# Patient Record
Sex: Male | Born: 1935 | Race: White | Hispanic: No | Marital: Married | Smoking: Former smoker
Health system: Southern US, Community
[De-identification: ages and names within clinical notes are randomized; demographics above are authoritative.]

## PROBLEM LIST (undated history)

## (undated) DIAGNOSIS — K5790 Diverticulosis of intestine, part unspecified, without perforation or abscess without bleeding: Secondary | ICD-10-CM

## (undated) DIAGNOSIS — D692 Other nonthrombocytopenic purpura: Secondary | ICD-10-CM

## (undated) DIAGNOSIS — H9190 Unspecified hearing loss, unspecified ear: Secondary | ICD-10-CM

## (undated) DIAGNOSIS — R911 Solitary pulmonary nodule: Secondary | ICD-10-CM

## (undated) DIAGNOSIS — I10 Essential (primary) hypertension: Secondary | ICD-10-CM

## (undated) DIAGNOSIS — C801 Malignant (primary) neoplasm, unspecified: Secondary | ICD-10-CM

## (undated) DIAGNOSIS — I82409 Acute embolism and thrombosis of unspecified deep veins of unspecified lower extremity: Secondary | ICD-10-CM

## (undated) DIAGNOSIS — D126 Benign neoplasm of colon, unspecified: Secondary | ICD-10-CM

## (undated) DIAGNOSIS — E119 Type 2 diabetes mellitus without complications: Secondary | ICD-10-CM

## (undated) DIAGNOSIS — K219 Gastro-esophageal reflux disease without esophagitis: Secondary | ICD-10-CM

## (undated) DIAGNOSIS — D649 Anemia, unspecified: Secondary | ICD-10-CM

## (undated) DIAGNOSIS — R0600 Dyspnea, unspecified: Secondary | ICD-10-CM

## (undated) DIAGNOSIS — M1990 Unspecified osteoarthritis, unspecified site: Secondary | ICD-10-CM

## (undated) DIAGNOSIS — R011 Cardiac murmur, unspecified: Secondary | ICD-10-CM

## (undated) DIAGNOSIS — N289 Disorder of kidney and ureter, unspecified: Secondary | ICD-10-CM

## (undated) DIAGNOSIS — R0609 Other forms of dyspnea: Secondary | ICD-10-CM

## (undated) DIAGNOSIS — R0989 Other specified symptoms and signs involving the circulatory and respiratory systems: Secondary | ICD-10-CM

## (undated) DIAGNOSIS — R591 Generalized enlarged lymph nodes: Secondary | ICD-10-CM

## (undated) DIAGNOSIS — E785 Hyperlipidemia, unspecified: Secondary | ICD-10-CM

## (undated) DIAGNOSIS — D682 Hereditary deficiency of other clotting factors: Secondary | ICD-10-CM

## (undated) DIAGNOSIS — K4090 Unilateral inguinal hernia, without obstruction or gangrene, not specified as recurrent: Secondary | ICD-10-CM

## (undated) HISTORY — DX: Benign neoplasm of colon, unspecified: D12.6

## (undated) HISTORY — DX: Unilateral inguinal hernia, without obstruction or gangrene, not specified as recurrent: K40.90

## (undated) HISTORY — DX: Malignant (primary) neoplasm, unspecified: C80.1

## (undated) HISTORY — DX: Hyperlipidemia, unspecified: E78.5

## (undated) HISTORY — DX: Other forms of dyspnea: R06.09

## (undated) HISTORY — PX: PILONIDAL CYST EXCISION: SHX744

## (undated) HISTORY — DX: Acute embolism and thrombosis of unspecified deep veins of unspecified lower extremity: I82.409

## (undated) HISTORY — DX: Other specified symptoms and signs involving the circulatory and respiratory systems: R09.89

## (undated) HISTORY — DX: Solitary pulmonary nodule: R91.1

## (undated) HISTORY — DX: Cardiac murmur, unspecified: R01.1

## (undated) HISTORY — PX: HERNIA REPAIR: SHX51

## (undated) HISTORY — PX: COLON SURGERY: SHX602

## (undated) HISTORY — DX: Other nonthrombocytopenic purpura: D69.2

## (undated) HISTORY — PX: SURGERY OF LIP: SUR1315

## (undated) HISTORY — PX: MASTOIDECTOMY: SHX711

## (undated) HISTORY — PX: HAND / FINGER LESION EXCISION: SUR531

## (undated) HISTORY — DX: Type 2 diabetes mellitus without complications: E11.9

## (undated) HISTORY — DX: Diverticulosis of intestine, part unspecified, without perforation or abscess without bleeding: K57.90

## (undated) HISTORY — DX: Unspecified osteoarthritis, unspecified site: M19.90

## (undated) HISTORY — DX: Generalized enlarged lymph nodes: R59.1

## (undated) HISTORY — DX: Essential (primary) hypertension: I10

## (undated) HISTORY — DX: Disorder of kidney and ureter, unspecified: N28.9

## (undated) HISTORY — DX: Hereditary deficiency of other clotting factors: D68.2

## (undated) HISTORY — DX: Unspecified hearing loss, unspecified ear: H91.90

## (undated) MED FILL — Ferumoxytol Inj 510 MG/17ML (30 MG/ML) (Elemental Fe): INTRAVENOUS | Qty: 17 | Status: AC

---

## 2021-09-13 ENCOUNTER — Encounter (INDEPENDENT_AMBULATORY_CARE_PROVIDER_SITE_OTHER): Payer: Self-pay | Admitting: *Deleted

## 2021-10-31 ENCOUNTER — Encounter (INDEPENDENT_AMBULATORY_CARE_PROVIDER_SITE_OTHER): Payer: Self-pay | Admitting: Gastroenterology

## 2021-10-31 ENCOUNTER — Ambulatory Visit (INDEPENDENT_AMBULATORY_CARE_PROVIDER_SITE_OTHER): Payer: Medicare Other | Admitting: Gastroenterology

## 2021-10-31 VITALS — BP 145/62 | HR 54 | Temp 97.6°F | Ht 70.0 in | Wt 207.9 lb

## 2021-10-31 DIAGNOSIS — D509 Iron deficiency anemia, unspecified: Secondary | ICD-10-CM | POA: Insufficient documentation

## 2021-10-31 DIAGNOSIS — R933 Abnormal findings on diagnostic imaging of other parts of digestive tract: Secondary | ICD-10-CM

## 2021-10-31 DIAGNOSIS — R634 Abnormal weight loss: Secondary | ICD-10-CM | POA: Diagnosis not present

## 2021-10-31 NOTE — Patient Instructions (Signed)
Please take two iron tablets daily as you are symptomatic since decreasing to one per day ?As discussed, I did speak with Dr. Jenetta Downer regarding his recommendations, we are not able to view your recent CT images and in light of your iron deficiency, it is recommended that you proceed with EGD and colonoscopy for further evaluation. ?I understand your reservations and if you prefer to go home and discuss this further and let me know how you would like to proceed, that is fine. ?There is also a potential to do a lovenox bridge for a few days prior to your procedures while you stop your eliquis,  the doctor that prescribes your eliquis could initiate this for you if you decide to proceed. ? ?

## 2021-10-31 NOTE — Progress Notes (Signed)
? ?Referring Provider: Pomposini, Cherly Anderson, MD ?Primary Care Physician:  Pomposini, Cherly Anderson, MD ?Primary GI Physician: new ? ?Chief Complaint  ?Patient presents with  ? Anemia  ?  Patient arrives with wife Herbert Wood for a new patient visit for IDA.   ? ?HPI:   ?Herbert Wood is a 87 y.o. 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 ? ?Patient presenting today as new patient for IDA. ?  ?Hgb in feb 8.9 with MCV 87.1, ferritin 12, Iron 38, Iron sat 10%, TIBC 383, B12 1965 ?Started on iron BID, repeat labs in March: hgb 10.1, iron 133, ferritin 48. Fecal occult blood testing negative at that time.    ? ?Patient had CT Chest/Abd/Pelv wo contrast on 10/03/21 with findings of scattered reticular nodular densities of both lungs, likely post infectious or inflammatory, irregular colonic wall thickening involving the splenic flexure, suspicious for malignancy, colonoscopy follow up recommended, prominent bilateral inguinal lymph nodes, likely reactive.  ? ?States that he was diagnosed with IDA in February by PCP, started on iron BID, had repeat labs in march and was told to decrease down to one iron tablet per day. Was feeling more fatigued, dizziness upon standing and SOB, states that he felt better after he started on Iron BID, though after cutting back to once a day he began feeling more fatigue with some sob and dizziness again, not as bad as initially prior to starting iron. Does note black stools since starting iron, no BRBPR, no melena prior to Fe supplementation. Denies constipation or diarrhea. Does endorse weight loss, has lost maybe 20 lbs in the last 6 months and decrease in appetite. Notes a decrease in activity recently due to not feeling well. Denies abdominal pain, no early satiety, bloating, nausea or vomiting.  ? ?Denies issues with GERD, on pepcid '20mg'$  daily which seems to control  his symptoms well. Denies dysphagia or odynophagia.  ? ?NSAID use: none ?Social hx: no etoh or tobacco, previous smoker, quit in 1995. ?Fam [place]:GURK not think any hx of CRC in family ? ?Last Colonoscopy:08/20/17- Dr. Donnajean Lopes, had polyps per patient ?Last Endoscopy:never  ? ?Past Medical History:  ?Diagnosis Date  ? Adenomatous colon polyp   ? Carotid bruit   ? Chronic dyspnea   ? Diabetes ([place])   ? type 2  ? Diverticular disease   ? DVT (deep venous thrombosis) ([place])   ? lower limb  ? Factor V deficiency ([place])   ? 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 Hyperlipemia   ? Hypertension   ? Inguinal hernia   ? Kidney disease   ? stage 3  ? Lung nodule   ? Lymphadenopathy   ? 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 Murmur   ? Osteoarthritis   ? Senile purpura ([place])   ? ? ?Current Outpatient Medications  ?Medication Sig Dispense Refill  ? albuterol (VENTOLIN HFA) 108 (90 Base) MCG/ACT inhaler Inhale into the lungs. As needed    ? amitriptyline (ELAVIL) 10 MG tablet Take 10 mg by mouth at bedtime.    ? amLODipine (NORVASC) 5 MG tablet Take 5 mg by mouth 2 (two) times daily.    ? apixaban (ELIQUIS) 5 MG TABS tablet Take 5 mg by mouth 2 (two) times daily.    ? atorvastatin (LIPITOR) 40 MG tablet Take 40 mg by mouth daily.    ?  carvedilol (COREG) 6.25 MG tablet Take 6.25 mg by mouth 2 (two) times daily with a meal.    ? dorzolamide (TRUSOPT) 2 % ophthalmic solution 1 drop 3 (three) times daily. One drop bid    ? Famotidine (PEPCID AC PO) Take by mouth. One daily    ? furosemide (LASIX) 20 MG tablet Take 20 mg by mouth. One daily as needed    ? hydrALAZINE (APRESOLINE) 25 MG tablet Take 25 mg by mouth. 2 tid    ? irbesartan (AVAPRO) 300 MG tablet Take 300 mg by mouth daily.    ? latanoprost (XALATAN) 0.005 % ophthalmic solution 1 drop at bedtime.    ? linagliptin (TRADJENTA) 5 MG TABS tablet Take 5 mg by mouth daily.    ? loratadine (CLARITIN) 10 MG tablet Take 10 mg by mouth daily.    ? LORazepam (ATIVAN) 0.5 MG tablet Take 0.5  mg by mouth. One bid prn    ? OVER THE COUNTER MEDICATION Nature's bounty iron 65 mg one bid    ? OVER THE COUNTER MEDICATION     ? OVER THE COUNTER MEDICATION Vit C one daily ?Folic acid with U76 one daily 800 mcg ?Methyl B-12 1,000 mcg one daily ?Prostate support softgels one daily    ? ?No current facility-administered medications for this visit.  ? ? ?Allergies as of 10/31/2021  ? (No Known Allergies)  ? ? ?Social History  ? ?Socioeconomic History  ? Marital status: Married  ?  Spouse name: Not on file  ? Number of children: Not on file  ? Years of education: Not on file  ? Highest education level: Not on file  ?Occupational History  ? Not on file  ?Tobacco Use  ? Smoking status: Former  ?  Types: Cigarettes  ?  Passive exposure: Past  ? Smokeless tobacco: Never  ?Substance and Sexual Activity  ? Alcohol use: Never  ? Drug use: Never  ? Sexual activity: Not on file  ?Other Topics Concern  ? Not on file  ?Social History Narrative  ? Not on file  ? ?Social Determinants of Health  ? ?Financial Resource Strain: Not on file  ?Food Insecurity: Not on file  ?Transportation Needs: Not on file  ?Physical Activity: Not on file  ?Stress: Not on file  ?Social Connections: Not on file  ?\ ?Review of systems ?General: negative for night sweats, fever, chills, +weight loss +fatigue  ?Neck: Negative for lumps, goiter, pain and significant neck swelling ?Resp: Negative for cough, wheezing, +sob ?CV: Negative for chest pain, leg swelling, palpitations, orthopnea +dizziness ?GI: denies melena, hematochezia, nausea, vomiting, diarrhea, constipation, dysphagia, odyonophagia, early satiety. +weight loss ?MSK: Negative for joint pain or swelling, back pain, and muscle pain. ?Derm: Negative for itching or rash ?Psych: Denies depression, anxiety, memory loss, confusion. No homicidal or suicidal ideation.  ?Heme: Negative for prolonged bleeding, bruising easily, and swollen nodes. ?Endocrine: Negative for cold or heat intolerance,  polyuria, polydipsia and goiter. ?Neuro: negative for tremor, gait imbalance, syncope and seizures. ?The remainder of the review of systems is noncontributory. ? ?Physical Exam: ?BP (!) 145/62 (BP Location: Left Arm, Patient Position: Sitting, Cuff Size: Large)   Pulse (!) 54   Temp 97.6 ?F (36.4 ?C) (Oral)   Ht '5\' 10"'$  (1.778 m)   Wt 207 lb 14.4 oz (94.3 kg)   BMI 29.83 kg/m?  ?General:   Alert and oriented. No distress noted. Pleasant and cooperative.  ?Head:  Normocephalic and atraumatic. ?Eyes:  Conjuctiva clear  without scleral icterus. ?Mouth:  Oral mucosa pink and moist. Good dentition. No lesions. ?Heart: Normal rate and rhythm, s1 and s2 heart sounds present.  ?Lungs: Clear lung sounds in all lobes. Respirations equal and unlabored. ?Abdomen:  +BS, soft, non-tender and non-distended. No rebound or guarding. No HSM or masses noted. ?Derm: No palmar erythema or jaundice ?Msk:  Symmetrical without gross deformities. Normal posture. ?Extremities:  Without edema. ?Neurologic:  Alert and  oriented x4 ?Psych:  Alert and cooperative. Normal mood and affect. ? ?Invalid input(s): 6 MONTHS  ? ?ASSESSMENT: ?Herbert Wood is a 87 y.o. male presenting today as a new patient for IDA. ? ?IDA discovered in February 2022 with sob, fatigue and dizziness, started on BID oral iron with some improvement of iron studies, though more symptomatic after decreasing iron to once daily. CT A/P done 10/03/21 with irregular colonic wall thickening involving the splenic flexure, suspicious for malignancy. Pt endorses approx 20 pounds weight loss over the past 6 months without rectal bleeding or melena. Does feel that appetite has decreased, no abdominal pain, early satiety, bloating, diarrhea, constipation or any other changes in bowel habits. I discussed recommendations for further evaluation of IDA and CT findings with both EGD and Colonoscopy as we cannot rule out PUD, polyps, AVMs, or malignancy. Patient is maintained on eliquis due to  hx of factor V, patient and wife are concerned about patient undergoing endoscopic procedures given his age and co-morbidities. I do feel at this time that benefits of endoscopic evaluation outweigh the risks, in

## 2021-11-01 ENCOUNTER — Other Ambulatory Visit: Payer: Self-pay

## 2021-11-01 ENCOUNTER — Emergency Department (HOSPITAL_COMMUNITY): Payer: Medicare Other

## 2021-11-01 ENCOUNTER — Encounter (HOSPITAL_COMMUNITY): Payer: Self-pay

## 2021-11-01 ENCOUNTER — Inpatient Hospital Stay (HOSPITAL_COMMUNITY)
Admission: EM | Admit: 2021-11-01 | Discharge: 2021-11-04 | DRG: 392 | Disposition: A | Discharge status: Home / Self Care | Payer: Medicare Other | Attending: Internal Medicine | Admitting: Internal Medicine

## 2021-11-01 DIAGNOSIS — I700 Atherosclerosis of aorta: Secondary | ICD-10-CM | POA: Diagnosis present

## 2021-11-01 DIAGNOSIS — E1142 Type 2 diabetes mellitus with diabetic polyneuropathy: Secondary | ICD-10-CM | POA: Diagnosis present

## 2021-11-01 DIAGNOSIS — N200 Calculus of kidney: Secondary | ICD-10-CM | POA: Diagnosis present

## 2021-11-01 DIAGNOSIS — Z791 Long term (current) use of non-steroidal anti-inflammatories (NSAID): Secondary | ICD-10-CM

## 2021-11-01 DIAGNOSIS — N400 Enlarged prostate without lower urinary tract symptoms: Secondary | ICD-10-CM | POA: Diagnosis present

## 2021-11-01 DIAGNOSIS — Z7901 Long term (current) use of anticoagulants: Secondary | ICD-10-CM | POA: Diagnosis not present

## 2021-11-01 DIAGNOSIS — Z86010 Personal history of colonic polyps: Secondary | ICD-10-CM | POA: Diagnosis not present

## 2021-11-01 DIAGNOSIS — D6851 Activated protein C resistance: Secondary | ICD-10-CM

## 2021-11-01 DIAGNOSIS — N1831 Chronic kidney disease, stage 3a: Secondary | ICD-10-CM | POA: Diagnosis present

## 2021-11-01 DIAGNOSIS — D509 Iron deficiency anemia, unspecified: Secondary | ICD-10-CM | POA: Diagnosis present

## 2021-11-01 DIAGNOSIS — I129 Hypertensive chronic kidney disease with stage 1 through stage 4 chronic kidney disease, or unspecified chronic kidney disease: Secondary | ICD-10-CM | POA: Diagnosis present

## 2021-11-01 DIAGNOSIS — H9190 Unspecified hearing loss, unspecified ear: Secondary | ICD-10-CM | POA: Diagnosis present

## 2021-11-01 DIAGNOSIS — Z888 Allergy status to other drugs, medicaments and biological substances status: Secondary | ICD-10-CM | POA: Diagnosis not present

## 2021-11-01 DIAGNOSIS — E1122 Type 2 diabetes mellitus with diabetic chronic kidney disease: Secondary | ICD-10-CM | POA: Diagnosis present

## 2021-11-01 DIAGNOSIS — Z79899 Other long term (current) drug therapy: Secondary | ICD-10-CM | POA: Diagnosis not present

## 2021-11-01 DIAGNOSIS — Z87891 Personal history of nicotine dependence: Secondary | ICD-10-CM

## 2021-11-01 DIAGNOSIS — G629 Polyneuropathy, unspecified: Secondary | ICD-10-CM

## 2021-11-01 DIAGNOSIS — D72829 Elevated white blood cell count, unspecified: Secondary | ICD-10-CM

## 2021-11-01 DIAGNOSIS — Z86718 Personal history of other venous thrombosis and embolism: Secondary | ICD-10-CM | POA: Diagnosis not present

## 2021-11-01 DIAGNOSIS — K5720 Diverticulitis of large intestine with perforation and abscess without bleeding: Principal | ICD-10-CM | POA: Diagnosis present

## 2021-11-01 DIAGNOSIS — N281 Cyst of kidney, acquired: Secondary | ICD-10-CM | POA: Diagnosis present

## 2021-11-01 DIAGNOSIS — K5792 Diverticulitis of intestine, part unspecified, without perforation or abscess without bleeding: Secondary | ICD-10-CM | POA: Insufficient documentation

## 2021-11-01 DIAGNOSIS — N183 Chronic kidney disease, stage 3 unspecified: Secondary | ICD-10-CM

## 2021-11-01 DIAGNOSIS — N189 Chronic kidney disease, unspecified: Secondary | ICD-10-CM

## 2021-11-01 DIAGNOSIS — R634 Abnormal weight loss: Secondary | ICD-10-CM | POA: Insufficient documentation

## 2021-11-01 DIAGNOSIS — I10 Essential (primary) hypertension: Secondary | ICD-10-CM | POA: Diagnosis present

## 2021-11-01 DIAGNOSIS — E785 Hyperlipidemia, unspecified: Secondary | ICD-10-CM

## 2021-11-01 DIAGNOSIS — E119 Type 2 diabetes mellitus without complications: Secondary | ICD-10-CM

## 2021-11-01 LAB — COMPREHENSIVE METABOLIC PANEL
ALT: 28 U/L (ref 0–44)
AST: 28 U/L (ref 15–41)
Albumin: 3.6 g/dL (ref 3.5–5.0)
Alkaline Phosphatase: 104 U/L (ref 38–126)
Anion gap: 10 (ref 5–15)
BUN: 34 mg/dL — ABNORMAL HIGH (ref 8–23)
CO2: 21 mmol/L — ABNORMAL LOW (ref 22–32)
Calcium: 8.7 mg/dL — ABNORMAL LOW (ref 8.9–10.3)
Chloride: 107 mmol/L (ref 98–111)
Creatinine, Ser: 1.27 mg/dL — ABNORMAL HIGH (ref 0.61–1.24)
GFR, Estimated: 55 mL/min — ABNORMAL LOW (ref >60)
Glucose, Bld: 163 mg/dL — ABNORMAL HIGH (ref 70–99)
Potassium: 3.9 mmol/L (ref 3.5–5.1)
Sodium: 138 mmol/L (ref 135–145)
Total Bilirubin: 1 mg/dL (ref 0.3–1.2)
Total Protein: 7.2 g/dL (ref 6.5–8.1)

## 2021-11-01 LAB — CBC
HCT: 30.8 % — ABNORMAL LOW (ref 39.0–52.0)
Hemoglobin: 9.9 g/dL — ABNORMAL LOW (ref 13.0–17.0)
MCH: 30.2 pg (ref 26.0–34.0)
MCHC: 32.1 g/dL (ref 30.0–36.0)
MCV: 93.9 fL (ref 80.0–100.0)
Platelets: 270 10*3/uL (ref 150–400)
RBC: 3.28 MIL/uL — ABNORMAL LOW (ref 4.22–5.81)
RDW: 18.7 % — ABNORMAL HIGH (ref 11.5–15.5)
WBC: 24.6 10*3/uL — ABNORMAL HIGH (ref 4.0–10.5)
nRBC: 0 % (ref 0.0–0.2)

## 2021-11-01 LAB — URINALYSIS, ROUTINE W REFLEX MICROSCOPIC
Bacteria, UA: NONE SEEN
Bilirubin Urine: NEGATIVE
Glucose, UA: NEGATIVE mg/dL
Hgb urine dipstick: NEGATIVE
Ketones, ur: NEGATIVE mg/dL
Nitrite: NEGATIVE
Protein, ur: 100 mg/dL — AB
Specific Gravity, Urine: 1.034 — ABNORMAL HIGH (ref 1.005–1.030)
pH: 5 (ref 5.0–8.0)

## 2021-11-01 LAB — LACTIC ACID, PLASMA: Lactic Acid, Venous: 1.4 mmol/L (ref 0.5–1.9)

## 2021-11-01 LAB — BRAIN NATRIURETIC PEPTIDE: B Natriuretic Peptide: 336 pg/mL — ABNORMAL HIGH (ref 0.0–100.0)

## 2021-11-01 LAB — LIPASE, BLOOD: Lipase: 29 U/L (ref 11–51)

## 2021-11-01 MED ORDER — PANTOPRAZOLE SODIUM 40 MG PO TBEC
40.0000 mg | DELAYED_RELEASE_TABLET | Freq: Two times a day (BID) | ORAL | Status: DC
  Administered 2021-11-01 – 2021-11-04 (×6): 40 mg via ORAL
  Filled 2021-11-01 (×6): qty 1

## 2021-11-01 MED ORDER — IPRATROPIUM BROMIDE 0.02 % IN SOLN: 0.5000 mg | Freq: Four times a day (QID) | RESPIRATORY_TRACT | Status: DC | PRN

## 2021-11-01 MED ORDER — AMITRIPTYLINE HCL 10 MG PO TABS
10.0000 mg | ORAL_TABLET | Freq: Every day | ORAL | Status: DC
Start: 2021-11-01 — End: 2021-11-04
  Administered 2021-11-01 – 2021-11-03 (×3): 10 mg via ORAL
  Filled 2021-11-01 (×4): qty 1

## 2021-11-01 MED ORDER — SODIUM CHLORIDE 0.9 % IV SOLN: INTRAVENOUS | Status: AC

## 2021-11-01 MED ORDER — HYDRALAZINE HCL 25 MG PO TABS
50.0000 mg | ORAL_TABLET | Freq: Three times a day (TID) | ORAL | Status: DC
Start: 2021-11-01 — End: 2021-11-04
  Administered 2021-11-01 – 2021-11-04 (×9): 50 mg via ORAL
  Filled 2021-11-01 (×9): qty 2

## 2021-11-01 MED ORDER — ONDANSETRON HCL 4 MG/2ML IJ SOLN
4.0000 mg | Freq: Once | INTRAMUSCULAR | Status: AC
  Administered 2021-11-01: 4 mg via INTRAVENOUS
  Filled 2021-11-01: qty 2

## 2021-11-01 MED ORDER — OXYCODONE HCL 5 MG PO TABS
5.0000 mg | ORAL_TABLET | ORAL | Status: DC | PRN
  Administered 2021-11-01: 5 mg via ORAL
  Filled 2021-11-01: qty 1

## 2021-11-01 MED ORDER — LEVALBUTEROL HCL 0.63 MG/3ML IN NEBU: 0.6300 mg | INHALATION_SOLUTION | Freq: Four times a day (QID) | RESPIRATORY_TRACT | Status: DC | PRN

## 2021-11-01 MED ORDER — DORZOLAMIDE HCL 2 % OP SOLN
1.0000 [drp] | Freq: Two times a day (BID) | OPHTHALMIC | Status: DC
  Administered 2021-11-01 – 2021-11-04 (×6): 1 [drp] via OPHTHALMIC
  Filled 2021-11-01 (×2): qty 10

## 2021-11-01 MED ORDER — CIPROFLOXACIN IN D5W 400 MG/200ML IV SOLN
400.0000 mg | Freq: Two times a day (BID) | INTRAVENOUS | Status: DC
  Administered 2021-11-01 – 2021-11-04 (×6): 400 mg via INTRAVENOUS
  Filled 2021-11-01 (×6): qty 200

## 2021-11-01 MED ORDER — APIXABAN 5 MG PO TABS
5.0000 mg | ORAL_TABLET | Freq: Two times a day (BID) | ORAL | Status: DC
  Administered 2021-11-01 – 2021-11-02 (×3): 5 mg via ORAL
  Filled 2021-11-01 (×3): qty 1

## 2021-11-01 MED ORDER — SODIUM CHLORIDE 0.9 % IV SOLN
250.0000 mL | INTRAVENOUS | Status: DC | PRN
  Administered 2021-11-03: 250 mL via INTRAVENOUS

## 2021-11-01 MED ORDER — SODIUM CHLORIDE 0.9% FLUSH
3.0000 mL | INTRAVENOUS | Status: DC | PRN
Start: 2021-11-01 — End: 2021-11-04

## 2021-11-01 MED ORDER — IOHEXOL 300 MG/ML  SOLN
100.0000 mL | Freq: Once | INTRAMUSCULAR | Status: AC | PRN
  Administered 2021-11-01: 100 mL via INTRAVENOUS

## 2021-11-01 MED ORDER — BISACODYL 5 MG PO TBEC: 5.0000 mg | DELAYED_RELEASE_TABLET | Freq: Every day | ORAL | Status: DC | PRN

## 2021-11-01 MED ORDER — ONDANSETRON HCL 4 MG PO TABS: 4.0000 mg | ORAL_TABLET | Freq: Four times a day (QID) | ORAL | Status: DC | PRN

## 2021-11-01 MED ORDER — SODIUM CHLORIDE 0.9 % IV BOLUS
500.0000 mL | Freq: Once | INTRAVENOUS | Status: AC
  Administered 2021-11-01: 500 mL via INTRAVENOUS

## 2021-11-01 MED ORDER — METRONIDAZOLE 500 MG/100ML IV SOLN
500.0000 mg | Freq: Three times a day (TID) | INTRAVENOUS | Status: DC
  Administered 2021-11-01 – 2021-11-04 (×8): 500 mg via INTRAVENOUS
  Filled 2021-11-01 (×8): qty 100

## 2021-11-01 MED ORDER — HEPARIN SODIUM (PORCINE) 5000 UNIT/ML IJ SOLN: 5000.0000 [IU] | Freq: Three times a day (TID) | INTRAMUSCULAR | Status: DC

## 2021-11-01 MED ORDER — MORPHINE SULFATE (PF) 4 MG/ML IV SOLN
4.0000 mg | Freq: Once | INTRAVENOUS | Status: AC
  Administered 2021-11-01: 4 mg via INTRAVENOUS
  Filled 2021-11-01: qty 1

## 2021-11-01 MED ORDER — SODIUM CHLORIDE 0.9 % IV SOLN
2.0000 g | Freq: Once | INTRAVENOUS | Status: AC
  Administered 2021-11-01: 2 g via INTRAVENOUS
  Filled 2021-11-01: qty 20

## 2021-11-01 MED ORDER — SODIUM CHLORIDE 0.9% FLUSH
3.0000 mL | Freq: Two times a day (BID) | INTRAVENOUS | Status: DC
  Administered 2021-11-01 – 2021-11-03 (×3): 3 mL via INTRAVENOUS

## 2021-11-01 MED ORDER — LORAZEPAM 0.5 MG PO TABS: 0.5000 mg | ORAL_TABLET | Freq: Two times a day (BID) | ORAL | Status: DC | PRN

## 2021-11-01 MED ORDER — AMLODIPINE BESYLATE 5 MG PO TABS
5.0000 mg | ORAL_TABLET | Freq: Two times a day (BID) | ORAL | Status: DC
Start: 2021-11-01 — End: 2021-11-04
  Administered 2021-11-01 – 2021-11-04 (×7): 5 mg via ORAL
  Filled 2021-11-01 (×7): qty 1

## 2021-11-01 MED ORDER — HYDROMORPHONE HCL 1 MG/ML IJ SOLN: 0.5000 mg | INTRAMUSCULAR | Status: DC | PRN

## 2021-11-01 MED ORDER — SODIUM CHLORIDE 0.9% FLUSH
3.0000 mL | Freq: Two times a day (BID) | INTRAVENOUS | Status: DC
  Administered 2021-11-02 – 2021-11-04 (×2): 3 mL via INTRAVENOUS

## 2021-11-01 MED ORDER — ACETAMINOPHEN 650 MG RE SUPP: 650.0000 mg | Freq: Four times a day (QID) | RECTAL | Status: DC | PRN

## 2021-11-01 MED ORDER — ATORVASTATIN CALCIUM 40 MG PO TABS
40.0000 mg | ORAL_TABLET | Freq: Every day | ORAL | Status: DC
  Administered 2021-11-01 – 2021-11-03 (×3): 40 mg via ORAL
  Filled 2021-11-01 (×3): qty 1

## 2021-11-01 MED ORDER — CARVEDILOL 3.125 MG PO TABS
6.2500 mg | ORAL_TABLET | Freq: Two times a day (BID) | ORAL | Status: DC
  Administered 2021-11-01 – 2021-11-04 (×6): 6.25 mg via ORAL
  Filled 2021-11-01 (×6): qty 2

## 2021-11-01 MED ORDER — HYDRALAZINE HCL 20 MG/ML IJ SOLN
10.0000 mg | INTRAMUSCULAR | Status: DC | PRN
  Administered 2021-11-03: 10 mg via INTRAVENOUS
  Filled 2021-11-01: qty 1

## 2021-11-01 MED ORDER — LINAGLIPTIN 5 MG PO TABS
5.0000 mg | ORAL_TABLET | Freq: Every day | ORAL | Status: DC
  Administered 2021-11-01 – 2021-11-04 (×4): 5 mg via ORAL
  Filled 2021-11-01 (×4): qty 1

## 2021-11-01 MED ORDER — SENNOSIDES-DOCUSATE SODIUM 8.6-50 MG PO TABS: 1.0000 | ORAL_TABLET | Freq: Every evening | ORAL | Status: DC | PRN

## 2021-11-01 MED ORDER — METRONIDAZOLE 500 MG/100ML IV SOLN
500.0000 mg | Freq: Once | INTRAVENOUS | Status: AC
  Administered 2021-11-01: 500 mg via INTRAVENOUS
  Filled 2021-11-01: qty 100

## 2021-11-01 MED ORDER — IRBESARTAN 150 MG PO TABS
300.0000 mg | ORAL_TABLET | Freq: Every day | ORAL | Status: DC
  Administered 2021-11-01 – 2021-11-03 (×3): 300 mg via ORAL
  Filled 2021-11-01 (×3): qty 2

## 2021-11-01 MED ORDER — LATANOPROST 0.005 % OP SOLN
1.0000 [drp] | Freq: Every day | OPHTHALMIC | Status: DC
  Administered 2021-11-01 – 2021-11-03 (×3): 1 [drp] via OPHTHALMIC
  Filled 2021-11-01: qty 2.5

## 2021-11-01 MED ORDER — ACETAMINOPHEN 325 MG PO TABS
650.0000 mg | ORAL_TABLET | Freq: Four times a day (QID) | ORAL | Status: DC | PRN
  Administered 2021-11-01: 650 mg via ORAL
  Filled 2021-11-01: qty 2

## 2021-11-01 MED ORDER — TRAZODONE HCL 50 MG PO TABS
25.0000 mg | ORAL_TABLET | Freq: Every evening | ORAL | Status: DC | PRN
Start: 2021-11-01 — End: 2021-11-04

## 2021-11-01 MED ORDER — ONDANSETRON HCL 4 MG/2ML IJ SOLN
4.0000 mg | Freq: Four times a day (QID) | INTRAMUSCULAR | Status: DC | PRN
Start: 2021-11-01 — End: 2021-11-04
  Filled 2021-11-01: qty 2

## 2021-11-01 NOTE — Progress Notes (Signed)
[facility] ? ?Reviewed CT scan. Question diverticulitis of the splenic flexure with thickening and stranding. Possible intramural abscess. Would not do any IR to this given that it is likely intramural and will cause a fistula. No operative indication at this time. Would treat with antibiotics. If does not improve may need further evaluation and repeat CT with oral contrast.  ? ?Agree that this could be something other than diverticulitis and warrants a colonoscopy. ? ?Curlene Labrum, MD ?[facility] ?[place], [place] [address] ?[phone] (office) ? ?

## 2021-11-01 NOTE — Assessment & Plan Note (Signed)
-   Stable continue as needed analgesics ?

## 2021-11-01 NOTE — Assessment & Plan Note (Signed)
-   We will holding home medication ?-Checking [facility] QA [facility] with [employer] coverage ?

## 2021-11-01 NOTE — Assessment & Plan Note (Signed)
-   Continue home dose statins ?

## 2021-11-01 NOTE — Progress Notes (Addendum)
? ? ?Subjective: ?Reports he was in his usual state of health yesterday when he saw Vikki Ports, NP in the office, but developed acute onset mid lower abdominal pain pain around 11 last night.  Had 2 episodes of nonbloody emesis as well as a couple of bowel movements sometime overnight/early this morning without BRBPR or hematemesis.  Reports his stools have been dark since he was started on iron in February.  Reports his abdominal pain is about a 4 out of 10, described more as a pressure in his mid lower abdomen. ? ?Admits to taking Aleve cold and sinus daily for the last 6 months. No other NSAIDs.  Takes Pepcid every morning with this.  Denies reflux symptoms or dysphagia.  ? ?No prior EGD. ?Last colonoscopy was in 2019 with Dr. Docia Furl with polyps per patient. ? ?Objective: ?Vital signs in last 24 hours: ?Temp:  [98.5 ?F (36.9 ?C)] 98.5 ?F (36.9 ?C) (04/04 1008) ?Pulse Rate:  [81-85] 81 (04/04 1400) ?Resp:  [18-23] 23 (04/04 1400) ?BP: (165-180)/(60-119) 169/64 (04/04 1400) ?SpO2:  [91 %-95 %] 93 % (04/04 1400) ?Weight:  [94.8 kg] 94.8 kg (04/04 1010) ?  ?General:   Alert and oriented, pleasant, NAD ?Head:  Normocephalic and atraumatic. ?Eyes:  No icterus, sclera clear. Conjuctiva pink.  ?Heart:  S1, S2 present, no murmurs noted.  ?Lungs: Clear to auscultation bilaterally, without wheezing, rales, or rhonchi.  ?Abdomen:  Bowel sounds present, soft, non-distended. Very mild tenderness only to deep palpation in the left lower quadrant.  No HSM or hernias noted. No rebound or guarding. No masses appreciated  ?Msk:  Symmetrical without gross deformities. Normal posture. ?Extremities:  Without edema. ?Neurologic:  Alert and  oriented x4;  grossly normal neurologically. ?Skin:  Warm and dry, intact without significant lesions.  ?Psych:  Normal mood and affect. ? ? ?Lab Results: ?Recent Labs  ?  11/01/21 ?1023  ?WBC 24.6*  ?HGB 9.9*  ?HCT 30.8*  ?PLT 270  ? ?BMET ?Recent Labs  ?  11/01/21 ?1023  ?NA 138  ?K 3.9  ?CL 107   ?CO2 21*  ?GLUCOSE 163*  ?BUN 34*  ?CREATININE 1.27*  ?CALCIUM 8.7*  ? ?LFT ?Recent Labs  ?  11/01/21 ?1023  ?PROT 7.2  ?ALBUMIN 3.6  ?AST 28  ?ALT 28  ?ALKPHOS 104  ?BILITOT 1.0  ? ? ?Studies/Results: ?CT ABDOMEN PELVIS W CONTRAST ? ?Result Date: 11/01/2021 ?CLINICAL DATA:  Fever abdominal pain and vomiting since last night. EXAM: CT ABDOMEN AND PELVIS WITH CONTRAST TECHNIQUE: Multidetector CT imaging of the abdomen and pelvis was performed using the standard protocol following bolus administration of intravenous contrast. RADIATION DOSE REDUCTION: This exam was performed according to the departmental dose-optimization program which includes automated exposure control, adjustment of the mA and/or kV according to patient size and/or use of iterative reconstruction technique. CONTRAST:  111m OMNIPAQUE IOHEXOL 300 MG/ML  SOLN COMPARISON:  None FINDINGS: Lower chest: The lung bases are clear of acute process. No pleural effusion or pulmonary lesions. The heart is normal in size. No pericardial effusion. Aortic and coronary artery calcifications are noted. The distal esophagus and aorta are unremarkable. Hepatobiliary: No hepatic lesions or intrahepatic biliary dilatation. The gallbladder is unremarkable. No common bile duct dilatation. Pancreas: No mass, inflammation ductal dilatation. Spleen: Normal size.  No focal lesions. Adrenals/Urinary Tract: The adrenal glands are normal. There are numerous bilateral renal cysts. No worrisome renal lesions. Small right renal calculi. No hydroureteronephrosis. The bladder is unremarkable. Stomach/Bowel: The stomach, duodenum, small bowel and  terminal ileum are unremarkable. The appendix is normal. There is diffuse and severe colonic diverticulosis. Significant inflammatory type changes involving the splenic flexure region the colon. I suspect this is acute diverticulitis with a possible intramural abscess measuring 3.5 cm. Surrounding inflammatory changes involving the  pericolonic fat. I do not see an obvious mass lesion but would treat this patient for diverticulitis and recommend correlation with colon cancer screening history and possible follow-up colonoscopy if indicated. Vascular/Lymphatic: Advanced atherosclerotic calcification involving the aorta and branch vessels but no aneurysm or dissection. The major venous structures are patent. No mesenteric or retroperitoneal mass or adenopathy. Reproductive: Enlarged prostate gland with median lobe hypertrophy impressing on the base bladder. The seminal vesicles are unremarkable. Other: Small amount of free pelvic fluid is noted. Musculoskeletal: No significant bony findings. Age related osteoporosis. IMPRESSION: 1. CT findings suggest acute diverticulitis involving the splenic flexure region the colon with a probable 3.5 cm intramural abscess. 2. No obvious colonic mass. Recommend correlation with colon cancer screening history and possible follow-up colonoscopy if indicated. 3. Advanced atherosclerotic calcification involving the aorta and branch vessels. 4. Enlarged prostate gland with median lobe hypertrophy impressing on the base bladder. 5. Numerous bilateral renal cysts. 6. Small right renal calculi. Aortic Atherosclerosis (ICD10-I70.0). Electronically Signed   By: Marijo Sanes M.D.   On: 11/01/2021 12:08   ? ?Assessment: ?87 y.o. 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  CT A/P with contrast with diffuse and severe colonic diverticulosis, significant inflammatory type changes involving splenic flexure suspected to be secondary to acute diverticulitis, possible intramural abscess measuring 3.5 cm, surrounding inflammatory  changes involving pericolonic fat.  No obvious colonic mass.  ? ?Acute sigmoid diverticulitis with possible abscess: ?Noted on CT A/P with contrast upon presentation.  Associated mild lower abdominal pressure, nausea/vomiting/fever overnight, now improved.  WBC elevated at 24.6.  In light of significantly elevated white blood cell count and possible abscess, patient is being admitted for IV antibiotics.  He will need a colonoscopy no sooner than 8 weeks from now to rule out underlying malignancy.  Notably, prior CT Chest/Abd/Pel w/o contrast on 10/03/21 in [place] (per GI OV note yesterday) with findings of irregular colonic wall thickening involving the splenic flexure, suspicious for malignancy. He has also endorsed a 20 lb weight loss over the last 6 months along with decreased appetite. Last colonoscopy in 2019 with Dr. Docia Furl with polyps per patient. ? ?IDA:  ?Found to have iron deficiency anemia in February with a hemoglobin of 8.9, ferritin 12, iron 38, iron saturation 10% (per GI OV note yesterday).  He denies ever having any overt GI bleeding, but reports stools have been dark since he was started on iron in February.  Hemoglobin came up to 10.1 in March.  Hemoglobin stable at 9.9 this admission with normocytic indices.  He is chronically on Eliquis and previously denied NSAIDs, but today, admits to taking Aleve cold and sinus daily for the last 6 months.  No prior EGD.  Last colonoscopy in 2019 with Dr. Docia Furl with polyps per patient.  GI had planned for outpatient EGD and colonoscopy with possible Lovenox bridge due to history of DVT and factor V Leiden deficiency.  As his hemoglobin is stable, we will  continue with plans for outpatient procedures unless he develops overt GI bleeding or declining hemoglobin. ? ? ?Plan: ?Clear liquid diet. ?IV ciprofloxacin 400 mg twice daily. ?IV Flagyl 500 mg 3 times daily ?Monitor H&H and for overt GI bleeding. ?He will need outpatient colonoscopy no sooner than 8  weeks from now pending clinical course.  He will also need EGD at the same time.  Could consider inpatient EGD if declining hemoglobin or overt GI bleeding.  ? ? LOS: 0 days  ? ? 11/01/2021, 4:18 PM ? ? ?[employer]

## 2021-11-01 NOTE — Assessment & Plan Note (Signed)
-   Currently stable, chronically coagulated with Eliquis ?

## 2021-11-01 NOTE — Hospital Course (Signed)
? ?  Herbert Wood is a 87 year old male with significant past medical history of factor V [place] deficiency, DVT (on Eliquis), CKD 3, HTN, HLD, peripheral neuropathy, DM 2, hearing loss, iron deficiency anemia ... Presented today with chief complaint of abdominal pain, nausea vomiting. ? ?Per patient his symptoms started yesterday now he is developing fever per wife he had had a Tmax of 103, last night around 11 PM.  He had 2 episode of nonbloody emesis.  Had a normal bowel movement which was nonbloody.  Bowel movement did not relieve his pain, he continued have periumbilical region pain without radiation worsened with movement. ?Patient and his wife had had [place] food last night his wife did not get sick. ?He has been recently seen by GI for iron deficiency anemia no reported history of diverticulosis or diverticulitis. ?He has had plan with Dr. Gypsy Balsam for endoscopy and colonoscopy in near future. ? ?ED; ?Blood pressure (!) 169/64, pulse 81, temperature 98.5 ?F (36.9 ?C), temperature source Oral, resp. rate (!) 23, height '5\' 10"'$  (1.778 m), weight 94.8 kg, SpO2 93 %. ? ? ?Labs:  ?CMP within normal notes with exception of glucose 163, BUN 34, creatinine 1.27, calcium 8.7, LFT within normal limits, lipase 29, ALT 28, AST 28, T. bili 1.0, lactic acid 1.4, WBC 24.6, ? ?CT abdomen/pelvis: Acute diverticulitis involving splenic flexure in the colon, and probable 3.5 cm intramural abscess.  ?-With nonspecific finding of aortic atherosclerosis, BPH, renal cyst, small renal calculi ? ? ?Findings were discussed with general surgery Dr. Constance Haw who is not concerned about abscess..  Continue medical management for now. ? ?GI consulted ?

## 2021-11-01 NOTE — Assessment & Plan Note (Signed)
-   BUN/creatinine at baseline ?-Avoiding nephrotoxins ?

## 2021-11-01 NOTE — H&P (Signed)
?History and Physical  ? ?Patient: Herbert Wood                            PCP: Clinton Quant, MD                    ?DOB: 10/30/1934            DOA: 11/01/2021 ?QIO:[id number]             DOS: 11/01/2021, 2:56 PM ? ?Pomposini, Cherly Anderson, MD ? ?Patient coming from:   HOME  ?I have personally reviewed patient's medical records, in electronic medical records, including:  ?[place] link, and care everywhere.  ? ? ?Chief Complaint:  ? ?Chief Complaint  ?Patient presents with  ? Abdominal Pain  ? ? ?History of present illness:  ? ? ? ? ?Herbert Wood is a 87 year old male with significant past medical history of factor V Leyden deficiency, DVT (on Eliquis), CKD 3, HTN, HLD, peripheral neuropathy, DM 2, hearing loss, iron deficiency anemia ... Presented today with chief complaint of abdominal pain, nausea vomiting. ? ?Per patient his symptoms started yesterday now he is developing fever per wife he had had a Tmax of 103, last night around 11 PM.  He had 2 episode of nonbloody emesis.  Had a normal bowel movement which was nonbloody.  Bowel movement did not relieve his pain, he continued have periumbilical region pain without radiation worsened with movement. ?Patient and his wife had had [place] food last night his wife did not get sick. ?He has been recently seen by GI for iron deficiency anemia no reported history of diverticulosis or diverticulitis. ?He has had plan with Dr. Gypsy Balsam for endoscopy and colonoscopy in near future. ? ?ED; ?Blood pressure (!) 169/64, pulse 81, temperature 98.5 ?F (36.9 ?C), temperature source Oral, resp. rate (!) 23, height '5\' 10"'$  (1.778 m), weight 94.8 kg, SpO2 93 %. ? ? ?Labs:  ?CMP within normal notes with exception of glucose 163, BUN 34, creatinine 1.27, calcium 8.7, LFT within normal limits, lipase 29, ALT 28, AST 28, T. bili 1.0, lactic acid 1.4, WBC 24.6, ? ?CT abdomen/pelvis: Acute diverticulitis involving splenic flexure in the colon, and probable 3.5 cm intramural abscess.  ?-With  nonspecific finding of aortic atherosclerosis, BPH, renal cyst, small renal calculi ? ? ?Findings were discussed with general surgery Dr. Constance Haw who is not concerned about abscess..  Continue medical management for now. ? ?GI consulted ? ? ? Patient Denies having: , Chills, Cough, SOB, Chest Pain, Abd pain, N/V/D, headache, dizziness, lightheadedness,  Dysuria, Joint pain, rash, open wounds ? ?Review of Systems: As per HPI, otherwise 10 point review of systems were negative.  ? ?---------------------------------------------------------------------------------------------------------------------- ? ?Allergies  ?Allergen Reactions  ? Gadobenate Nausea And Vomiting  ?  Immediately upon the infusion of 31m multihance contrast  Patient had exteme nausea and vomiting.   No other symptoms noted .  No injury.  MRI scan was completed after patient felt better.   ?Immediately upon the infusion of 126mmultihance contrast  Patient had exteme nausea and vomiting.   No other symptoms noted .  No injury.  MRI scan was completed after patient felt better.   ?  ? ? ?Home MEDs:  ?Prior to Admission medications   ?Medication Sig Start Date End Date Taking? Authorizing Provider  ?albuterol (VENTOLIN HFA) 108 (90 Base) MCG/ACT inhaler Inhale into the lungs. As needed   Yes [provider]  ?amitriptyline (ELAVIL) 10 MG tablet Take 10 mg by mouth at bedtime.   Yes [provider]  ?amLODipine (NORVASC) 5 MG tablet Take 5 mg by mouth 2 (two) times daily.   Yes [provider]  ?apixaban (ELIQUIS) 5 MG TABS tablet Take 5 mg by mouth 2 (two) times daily.   Yes [provider]  ?atorvastatin (LIPITOR) 40 MG tablet Take 40 mg by mouth daily.   Yes [provider]  ?carvedilol (COREG) 6.25 MG tablet Take 6.25 mg by mouth 2 (two) times daily with a meal.   Yes [provider]  ?dorzolamide (TRUSOPT) 2 % ophthalmic solution Place 1 drop into the left eye 2 (two) times daily. One drop bid    Yes [provider]  ?Famotidine (PEPCID AC PO) Take by mouth. One daily   Yes [provider]  ?furosemide (LASIX) 20 MG tablet Take 20 mg by mouth daily as needed for fluid. One daily as needed   Yes [provider]  ?hydrALAZINE (APRESOLINE) 25 MG tablet Take 50 mg by mouth 3 (three) times daily.   Yes [provider]  ?irbesartan (AVAPRO) 300 MG tablet Take 300 mg by mouth daily. 10/03/21  Yes [provider]  ?latanoprost (XALATAN) 0.005 % ophthalmic solution Place 1 drop into both eyes at bedtime.   Yes [provider]  ?linagliptin (TRADJENTA) 5 MG TABS tablet Take 5 mg by mouth daily.   Yes [provider]  ?LORazepam (ATIVAN) 0.5 MG tablet Take 0.5 mg by mouth 2 (two) times daily as needed for anxiety.   Yes [provider]  ?OVER THE COUNTER MEDICATION Nature's bounty iron 65 mg one bid   Yes [provider]  ?OVER THE COUNTER MEDICATION Vit C one daily ?Folic acid with T90 one daily 800 mcg ?Methyl B-12 1,000 mcg one daily ?Prostate support softgels one daily   Yes [provider]  ? ? ?PRN MEDs: sodium chloride, acetaminophen **OR** acetaminophen, bisacodyl, hydrALAZINE, HYDROmorphone (DILAUDID) injection, ipratropium, levalbuterol, LORazepam, ondansetron **OR** ondansetron (ZOFRAN) IV, oxyCODONE, senna-docusate, sodium chloride flush, traZODone ? ?Past Medical History:  ?Diagnosis Date  ? Adenomatous colon polyp   ? Carotid bruit   ? Chronic dyspnea   ? Diabetes ([place])   ? type 2  ? Diverticular disease   ? DVT (deep venous thrombosis) ([place])   ? lower limb  ? Factor V deficiency ([place])   ? 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 Hyperlipemia   ? Hypertension   ? Inguinal hernia   ? Kidney disease   ? stage 3  ? Lung nodule   ? Lymphadenopathy   ? 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 Murmur   ? Osteoarthritis   ? Senile purpura ([place])   ? ? ?History reviewed. No pertinent surgical history. ? ? reports that he has quit  smoking. His smoking use included cigarettes. He has been exposed to tobacco smoke. He has never used smokeless tobacco. He reports that he does not drink alcohol and does not use drugs. ? ? ?History reviewed. No pertinent family history. ? ?Physical Exam:  ? ?Vitals:  ? 11/01/21 1230 11/01/21 1300 11/01/21 1330 11/01/21 1400  ?BP: (!) 177/67 (!) 180/119 (!) 175/61 (!) 169/64  ?Pulse: 82 83 82 81  ?Resp: 18 20 (!) 22 (!) 23  ?Temp:      ?TempSrc:      ?SpO2: 91% 93% 91% 93%  ?Weight:      ?  Height:      ? ?Constitutional: NAD, calm, comfortable, hard of hearing ?Eyes: PERRL, lids and conjunctivae normal ?ENMT: Mucous membranes are moist. Posterior pharynx clear of any exudate or lesions.Normal dentition.  ?Neck: normal, supple, no masses, no thyromegaly ?Respiratory: clear to auscultation bilaterally, no wheezing, no crackles. Normal respiratory effort. No accessory muscle use.  ?Cardiovascular: Regular rate and rhythm, no murmurs / rubs / gallops. No extremity edema. 2+ pedal pulses. No carotid bruits.  ?Abdomen: Periumbilical tenderness, no masses palpated. No hepatosplenomegaly. Bowel sounds positive.  ?Musculoskeletal: no clubbing / cyanosis. No joint deformity upper and lower extremities. Good ROM, no contractures. Normal muscle tone.  ?Neurologic: CN II-XII grossly intact. Sensation intact, DTR normal. Strength 5/5 in all 4.  ?Psychiatric: Normal judgment and insight. Alert and oriented x 3. Normal mood.  ?Skin: no rashes, lesions, ulcers. No induration ?Wounds: per nursing documentation ?  ? ? ?Labs on admission:  ?  ?I have personally reviewed following labs and imaging studies ? ?CBC: ?Recent Labs  ?Lab 11/01/21 ?1023  ?WBC 24.6*  ?HGB 9.9*  ?HCT 30.8*  ?MCV 93.9  ?PLT 270  ? ?Basic Metabolic Panel: ?Recent Labs  ?Lab 11/01/21 ?1023  ?NA 138  ?K 3.9  ?CL 107  ?CO2 21*  ?GLUCOSE 163*  ?BUN 34*  ?CREATININE 1.27*  ?CALCIUM 8.7*  ? ?GFR: ?Estimated Creatinine Clearance: 49.1 mL/min (A) (by C-G formula based on  SCr of 1.27 mg/dL (H)). ?Liver Function Tests: ?Recent Labs  ?Lab 11/01/21 ?1023  ?AST 28  ?ALT 28  ?ALKPHOS 104  ?BILITOT 1.0  ?PROT 7.2  ?ALBUMIN 3.6  ? ?Recent Labs  ?Lab 11/01/21 ?1023  ?LIPASE 29

## 2021-11-01 NOTE — ED Triage Notes (Signed)
Patient with complaints of abdominal pain, fever, nausea, and vomiting that started last night.  ?

## 2021-11-01 NOTE — Plan of Care (Signed)

## 2021-11-01 NOTE — Assessment & Plan Note (Addendum)
Abdominal pain  ? ?CT abdomen/pelvis: Acute diverticulitis involving splenic flexure in the colon, and probable 3.5 cm intramural abscess.  ?-With nonspecific finding of aortic atherosclerosis, BPH, renal cyst, small renal calculi ? ? ?ED providers have discussed it with general surgery Dr. Constance Haw who is not concerned about abscess..  Recommending continue medical management for now, IV antibiotics. ? ?WBC 24.6, lactic acid 1.4, ?-Normotensive, with normal lactic acid ?-We will keep the patient n.p.o. ?-Gentle IV fluid hydration ?-Continue IV antibiotics Cipro/Flagyl ?(Received Rocephin/Flagyl in ED) ? ?-General surgery, GI consulted appreciate input ? ? ?

## 2021-11-01 NOTE — Assessment & Plan Note (Signed)
-   Currently stable, no complaints continue home medication of Eliquis ?- ?

## 2021-11-01 NOTE — Assessment & Plan Note (Signed)
-   Currently mildly hypertensive ?-Resuming home medication of Norvasc, Coreg, ?-As needed hydralazine IV ?

## 2021-11-01 NOTE — Assessment & Plan Note (Addendum)
Management as above °

## 2021-11-01 NOTE — Progress Notes (Signed)
Pr arrived to floor. Oriented to room, bed, call light. PO meds given. Temp on arrivel 100.7 orally. Tylenol po given. MD informed. Call light within reach. Will continue to monitor patient.  ?

## 2021-11-01 NOTE — Assessment & Plan Note (Signed)
-   Likely due to diverticulitis, abscess, ?-Broad-spectrum antibiotics initiated in ED Rocephin, Flagyl, will switch to IV Cipro and Flagyl ? ?

## 2021-11-01 NOTE — Assessment & Plan Note (Signed)
-   Patient is wearing hearing aids ?-Able to communicate at this time. ?

## 2021-11-01 NOTE — ED Provider Notes (Signed)
?[facility] ?Provider Note ? ? ?CSN: [id number] ?Arrival date & time: 11/01/21  7829 ? ?  ? ?History ? ?Chief Complaint  ?Patient presents with  ? Abdominal Pain  ? ? ?Herbert Wood is a 87 y.o. 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  His symptoms started last evening around 11 PM.  He reports 2 episodes of nonbloody emesis, he also had a normal bowel movement shortly after this pain began, also nonbloody which did not affect his pain.  He has localized pain to his periumbilical region which is constant without radiation and is worse with movement.  He last ate at a [place] with his wife yesterday evening, wife feels well.  He has taken Tylenol prior to arrival.  He denies history of diverticulitis.  He has had no abdominal surgeries. ? ?Wife also states he had a CT scan a month ago in [place] which showed some bowel thickening.  He actually had a consult with Dr. Laural Golden yesterday and endoscopy and colonoscopy have been scheduled. ? ?The history is provided by the patient and the spouse.  ? ?  ? ?Home Medications ?Prior to Admission medications   ?Medication Sig Start Date End Date Taking? Authorizing Provider  ?albuterol (VENTOLIN HFA) 108 (90 Base) MCG/ACT inhaler Inhale into the lungs. As needed   Yes [provider]  ?amitriptyline (ELAVIL) 10 MG tablet Take 10 mg by mouth at bedtime.   Yes [provider]  ?amLODipine (NORVASC) 5 MG tablet Take 5 mg by mouth 2 (two) times daily.   Yes [provider]  ?apixaban (ELIQUIS) 5 MG TABS tablet Take 5 mg by mouth 2 (two) times daily.   Yes [provider]  ?atorvastatin (LIPITOR) 40 MG tablet Take 40 mg by  mouth daily.   Yes [provider]  ?carvedilol (COREG) 6.25 MG tablet Take 6.25 mg by mouth 2 (two) times daily with a meal.   Yes [provider]  ?dorzolamide (TRUSOPT) 2 % ophthalmic solution Place 1 drop into the left eye 2 (two) times daily. One drop bid   Yes [provider]  ?Famotidine (PEPCID AC PO) Take by mouth. One daily   Yes [provider]  ?furosemide (LASIX) 20 MG tablet Take 20 mg by mouth daily as needed for fluid. One daily as needed   Yes [provider]  ?hydrALAZINE (APRESOLINE) 25 MG tablet Take 50 mg by mouth 3 (three) times daily.   Yes [provider]  ?irbesartan (AVAPRO) 300 MG tablet Take 300 mg by mouth daily. 10/03/21  Yes [provider]  ?latanoprost (XALATAN) 0.005 % ophthalmic solution Place 1 drop into both eyes at bedtime.   Yes [provider]  ?linagliptin (TRADJENTA) 5 MG TABS tablet Take 5 mg by mouth daily.   Yes [provider]  ?LORazepam (ATIVAN) 0.5 MG tablet Take 0.5 mg by mouth 2 (two) times daily as needed for anxiety.   Yes [provider]  ?OVER THE COUNTER MEDICATION Nature's bounty iron 65 mg one bid   Yes [provider]  ?OVER THE COUNTER MEDICATION Vit C one daily ?Folic acid with F62 one daily 800 mcg ?Methyl B-12 1,000 mcg one daily ?Prostate support softgels one daily  Yes [provider]  ?   ? ?Allergies    ?Gadobenate   ? ?Review of Systems   ?Review of Systems  ?Constitutional:  Positive for fever.  ?HENT:  Negative for congestion and sore throat.   ?Eyes: Negative.   ?Respiratory:  Negative for chest tightness and shortness of breath.   ?Cardiovascular:  Negative for chest pain.  ?Gastrointestinal:  Positive for abdominal pain, nausea and vomiting. Negative for constipation and diarrhea.  ?Genitourinary: Negative.   ?Musculoskeletal:  Negative for arthralgias, joint swelling and neck pain.  ?Skin: Negative.  Negative for rash and wound.   ?Neurological:  Negative for dizziness, weakness, light-headedness, numbness and headaches.  ?Psychiatric/Behavioral: Negative.    ?All other systems reviewed and are negative. ? ?Physical Exam ?Updated Vital Signs ?BP (!) 169/64   Pulse 81   Temp 98.5 ?F (36.9 ?C) (Oral)   Resp (!) 23   Ht '5\' 10"'$  (1.778 m)   Wt 94.8 kg   SpO2 93%   BMI 29.99 kg/m?  ?Physical Exam ?Vitals and nursing note reviewed.  ?Constitutional:   ?   Appearance: He is well-developed.  ?HENT:  ?   Head: Normocephalic and atraumatic.  ?Eyes:  ?   Conjunctiva/sclera: Conjunctivae normal.  ?Cardiovascular:  ?   Rate and Rhythm: Normal rate and regular rhythm.  ?   Heart sounds: Normal heart sounds.  ?Pulmonary:  ?   Effort: Pulmonary effort is normal.  ?   Breath sounds: Normal breath sounds. No wheezing.  ?Abdominal:  ?   General: Bowel sounds are normal.  ?   Palpations: Abdomen is soft. There is no pulsatile mass.  ?   Tenderness: There is abdominal tenderness in the periumbilical area. There is no guarding or rebound.  ?   Hernia: No hernia is present.  ?Musculoskeletal:     ?   General: Normal range of motion.  ?   Cervical back: Normal range of motion.  ?Skin: ?   General: Skin is warm and dry.  ?Neurological:  ?   Mental Status: He is alert.  ? ? ?ED Results / Procedures / Treatments   ?Labs ?(all labs ordered are listed, but only abnormal results are displayed) ?Labs Reviewed  ?COMPREHENSIVE METABOLIC PANEL - Abnormal; Notable for the following components:  ?    Result Value  ? CO2 21 (*)   ? Glucose, Bld 163 (*)   ? BUN 34 (*)   ? Creatinine, Ser 1.27 (*)   ? Calcium 8.7 (*)   ? GFR, Estimated 55 (*)   ? All other components within normal limits  ?CBC - Abnormal; Notable for the following components:  ? WBC 24.6 (*)   ? RBC 3.28 (*)   ? Hemoglobin 9.9 (*)   ? HCT 30.8 (*)   ? RDW 18.7 (*)   ? All other components within normal limits  ?LIPASE, BLOOD  ?LACTIC ACID, PLASMA  ?URINALYSIS, ROUTINE W REFLEX MICROSCOPIC  ?BRAIN  NATRIURETIC PEPTIDE  ? ? ?EKG ?None ? ?Radiology ?CT ABDOMEN PELVIS W CONTRAST ? ?Result Date: 11/01/2021 ?CLINICAL DATA:  Fever abdominal pain and vomiting since last night. EXAM: CT ABDOMEN AND PELVIS WITH CONTRAST TECHNIQUE: Multidetector CT imaging of the abdomen and pelvis was performed using the standard protocol following bolus administration of intravenous contrast. RADIATION DOSE REDUCTION: This exam was performed according to the departmental dose-optimization program which includes automated exposure control, adjustment of the mA and/or kV according to patient size and/or use of iterative reconstruction technique.  CONTRAST:  171m OMNIPAQUE IOHEXOL 300 MG/ML  SOLN COMPARISON:  None FINDINGS: Lower chest: The lung bases are clear of acute process. No pleural effusion or pulmonary lesions. The heart is normal in size. No pericardial effusion. Aortic and coronary artery calcifications are noted. The distal esophagus and aorta are unremarkable. Hepatobiliary: No hepatic lesions or intrahepatic biliary dilatation. The gallbladder is unremarkable. No common bile duct dilatation. Pancreas: No mass, inflammation ductal dilatation. Spleen: Normal size.  No focal lesions. Adrenals/Urinary Tract: The adrenal glands are normal. There are numerous bilateral renal cysts. No worrisome renal lesions. Small right renal calculi. No hydroureteronephrosis. The bladder is unremarkable. Stomach/Bowel: The stomach, duodenum, small bowel and terminal ileum are unremarkable. The appendix is normal. There is diffuse and severe colonic diverticulosis. Significant inflammatory type changes involving the splenic flexure region the colon. I suspect this is acute diverticulitis with a possible intramural abscess measuring 3.5 cm. Surrounding inflammatory changes involving the pericolonic fat. I do not see an obvious mass lesion but would treat this patient for diverticulitis and recommend correlation with colon cancer screening history  and possible follow-up colonoscopy if indicated. Vascular/Lymphatic: Advanced atherosclerotic calcification involving the aorta and branch vessels but no aneurysm or dissection. The major venous structures are patent. No mesenteric

## 2021-11-01 NOTE — Assessment & Plan Note (Addendum)
-   Monitoring H&H, ?-Currently stable ? ?-Recent iron study iron 38, iron sat 10%, TIBC 383, B12 1965, ?-Continue iron supplements ?

## 2021-11-02 ENCOUNTER — Encounter (INDEPENDENT_AMBULATORY_CARE_PROVIDER_SITE_OTHER): Payer: Self-pay

## 2021-11-02 DIAGNOSIS — K5720 Diverticulitis of large intestine with perforation and abscess without bleeding: Secondary | ICD-10-CM | POA: Diagnosis not present

## 2021-11-02 DIAGNOSIS — Z791 Long term (current) use of non-steroidal anti-inflammatories (NSAID): Secondary | ICD-10-CM

## 2021-11-02 LAB — CBC
HCT: 26.8 % — ABNORMAL LOW (ref 39.0–52.0)
Hemoglobin: 8.4 g/dL — ABNORMAL LOW (ref 13.0–17.0)
MCH: 29.6 pg (ref 26.0–34.0)
MCHC: 31.3 g/dL (ref 30.0–36.0)
MCV: 94.4 fL (ref 80.0–100.0)
Platelets: 195 K/uL (ref 150–400)
RBC: 2.84 MIL/uL — ABNORMAL LOW (ref 4.22–5.81)
RDW: 18.6 % — ABNORMAL HIGH (ref 11.5–15.5)
WBC: 20.6 K/uL — ABNORMAL HIGH (ref 4.0–10.5)
nRBC: 0 % (ref 0.0–0.2)

## 2021-11-02 LAB — GLUCOSE, CAPILLARY: Glucose-Capillary: 127 mg/dL — ABNORMAL HIGH (ref 70–99)

## 2021-11-02 LAB — PROTIME-INR
INR: 1.7 — ABNORMAL HIGH (ref 0.8–1.2)
Prothrombin Time: 20.1 seconds — ABNORMAL HIGH (ref 11.4–15.2)

## 2021-11-02 LAB — BASIC METABOLIC PANEL
Anion gap: 6 (ref 5–15)
BUN: 30 mg/dL — ABNORMAL HIGH (ref 8–23)
CO2: 24 mmol/L (ref 22–32)
Calcium: 8.1 mg/dL — ABNORMAL LOW (ref 8.9–10.3)
Chloride: 107 mmol/L (ref 98–111)
Creatinine, Ser: 1.33 mg/dL — ABNORMAL HIGH (ref 0.61–1.24)
GFR, Estimated: 52 mL/min — ABNORMAL LOW (ref >60)
Glucose, Bld: 124 mg/dL — ABNORMAL HIGH (ref 70–99)
Potassium: 4.1 mmol/L (ref 3.5–5.1)
Sodium: 137 mmol/L (ref 135–145)

## 2021-11-02 MED ORDER — SODIUM CHLORIDE 0.9 % IV SOLN
250.0000 mg | INTRAVENOUS | Status: AC
  Administered 2021-11-02 – 2021-11-03 (×2): 250 mg via INTRAVENOUS
  Filled 2021-11-02 (×2): qty 20

## 2021-11-02 NOTE — Evaluation (Signed)
Physical Therapy Evaluation ?Patient Details ?Name: Herbert Wood ?MRN: [id number] ?DOB: [birth year] ?Today's Date: 11/02/2021 ? ?History of Present Illness ? Herbert Wood is a 88 y.o. 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  His symptoms started last evening around 11 PM.  He reports 2 episodes of nonbloody emesis, he also had a normal bowel movement shortly after this pain began, also nonbloody which did not affect his pain.  He has localized pain to his periumbilical region which is constant without radiation and is worse with movement.  He last ate at a [place] with his wife yesterday evening, wife feels well.  He has taken Tylenol prior to arrival.  He denies history of diverticulitis.  He has had no abdominal surgeries.    Wife also states he had a CT scan a month ago in [place] which showed some bowel thickening.  He actually had a consult with Dr. Laural Golden yesterday and endoscopy and colonoscopy have been scheduled.     The history is provided by the patient and the spouse. ?  ?Clinical Impression ? Patient functioning near baseline for functional mobility and gait demonstrating good return for ambulation in room and hallways without loss of balance.  Patient mildly SOB after walking with SpO2 at 97% and HR at 79 BPM.  Plan:  Patient discharged from physical therapy to care of nursing for ambulation daily as tolerated for length of stay.  ?   ?   ? ?Recommendations for follow up therapy are one component of a multi-disciplinary discharge planning process, led by the attending physician.  Recommendations may be updated based on patient status, additional functional criteria and insurance authorization. ? ?Follow Up  Recommendations No PT follow up ? ?  ?Assistance Recommended at Discharge PRN  ?Patient can return home with the following ? Help with stairs or ramp for entrance ? ?  ?Equipment Recommendations None recommended by PT  ?Recommendations for Other Services ?    ?  ?Functional Status Assessment Patient has not had a recent decline in their functional status  ? ?  ?Precautions / Restrictions Precautions ?Precautions: None ?Restrictions ?Weight Bearing Restrictions: No  ? ?  ? ?Mobility ? Bed Mobility ?Overal bed mobility: Modified Independent ?  ?  ?  ?  ?  ?  ?  ?  ? ?Transfers ?Overall transfer level: Modified independent ?  ?  ?  ?  ?  ?  ?  ?  ?  ?  ? ?Ambulation/Gait ?Ambulation/Gait assistance: Modified independent (Device/Increase time) ?Gait Distance (Feet): 100 Feet ?Assistive device: None ?Gait Pattern/deviations: WFL(Within Functional Limits) ?Gait velocity: sllightly decreased ?  ?  ?General Gait Details: grossly WFL demonstrating good return for ambulation in room and hallway without loss of balance ? ?Stairs ?  ?  ?  ?  ?  ? ?Wheelchair Mobility ?  ? ?Modified Rankin (Stroke Patients Only) ?  ? ?  ? ?Balance Overall balance assessment: No apparent balance deficits (not formally assessed) ?  ?  ?  ?  ?  ?  ?  ?  ?  ?  ?  ?  ?  ?  ?  ?  ?  ?  ?   ? ? ? ?Pertinent Vitals/Pain Pain Assessment ?Pain  Assessment: No/denies pain  ? ? ?Home Living Family/patient expects to be discharged to:: Private residence ?Living Arrangements: Spouse/significant other ?Available Help at Discharge: Family;Available 24 hours/day ?Type of Home: House ?Home Access: Stairs to enter ?Entrance Stairs-Rails: None ?Entrance Stairs-Number of Steps: 1 ?  ?Home Layout: One level ?Home Equipment: Conservation officer, nature (2 wheels);Cane - single point ?   ?  ?Prior Function Prior Level of Function : Independent/Modified Independent ?  ?  ?  ?  ?  ?  ?Mobility Comments: Household and short distanced community ambulator, drives ?ADLs Comments:  Independent ?  ? ? ?Hand Dominance  ?   ? ?  ?Extremity/Trunk Assessment  ? Upper Extremity Assessment ?Upper Extremity Assessment: Overall WFL for tasks assessed ?  ? ?Lower Extremity Assessment ?Lower Extremity Assessment: Overall WFL for tasks assessed ?  ? ?Cervical / Trunk Assessment ?Cervical / Trunk Assessment: Normal  ?Communication  ? Communication: No difficulties  ?Cognition Arousal/Alertness: Awake/alert ?Behavior During Therapy: [facility] for tasks assessed/performed ?Overall Cognitive Status: Within Functional Limits for tasks assessed ?  ?  ?  ?  ?  ?  ?  ?  ?  ?  ?  ?  ?  ?  ?  ?  ?  ?  ?  ? ?  ?General Comments   ? ?  ?Exercises    ? ?Assessment/Plan  ?  ?PT Assessment Patient does not need any further PT services  ?PT Problem List   ? ?   ?  ?PT Treatment Interventions     ? ?PT Goals (Current goals can be found in the Care Plan section)  ?Acute Rehab PT Goals ?Patient Stated Goal: return home with family to assist ?PT Goal Formulation: With patient/family ?Time For Goal Achievement: 11/02/21 ?Potential to Achieve Goals: Good ? ?  ?Frequency   ?  ? ? ?Co-evaluation   ?  ?  ?  ?  ? ? ?  ?AM-PAC PT "6 Clicks" Mobility  ?Outcome Measure Help needed turning from your back to your side while in a flat bed without using bedrails?: None ?Help needed moving from lying on your back to sitting on the side of a flat bed without using bedrails?: None ?Help needed moving to and from a bed to a chair (including a wheelchair)?: None ?Help needed standing up from a chair using your arms (e.g., wheelchair or bedside chair)?: None ?Help needed to walk in hospital room?: None ?Help needed climbing 3-5 steps with a railing? : None ?6 Click Score: 24 ? ?  ?End of Session   ?Activity Tolerance: Patient tolerated treatment well;Patient limited by fatigue ?Patient left: in chair;with family/visitor present ?Nurse Communication: Mobility status ?PT Visit Diagnosis: Unsteadiness on feet (R26.81);Other abnormalities of gait and  mobility (R26.89);Muscle weakness (generalized) (M62.81) ?  ? ?Time: 0355-9741 ?PT Time Calculation (min) (ACUTE ONLY): 24 min ? ? ?Charges:   PT Evaluation ?$PT Eval Moderate Complexity: 1 Mod ?PT Treatments ?$Therapeutic Activity: 23-37 mins ?  ?   ? ? ?12:10 PM, 11/02/21 ?Lonell Grandchild, MPT ?Physical Therapist with [place] ?[facility] ?[phone] office ?[phone] mobile phone ? ? ?

## 2021-11-02 NOTE — TOC Initial Note (Signed)
Transition of Care (TOC) - Initial/Assessment Note  ? ? ?Patient Details  ?Name: Herbert Wood ?MRN: [id number] ?Date of Birth: 08-Sep-1934 ? ?Transition of Care (TOC) CM/SW Contact:    ?Salome Arnt, LCSW ?Phone Number: ?11/02/2021, 11:47 AM ? ?Clinical Narrative:  Pt admitted with colonic diverticular abscess. Assessment completed due to high risk readmission score and consult for home health/DME. Pt reports he lives with his wife and is independent with ADLs. He drives himself to appointments or his wife will take him. Pt plans to return home when medically stable. Per PT, no follow up needed. Pt indicates no DME or home health needs at this time. TOC will continue to follow.                ? ? ?Expected Discharge Plan: Home/Self Care ?Barriers to Discharge: Continued Medical Work up ? ? ?Patient Goals and CMS Choice ?Patient states their goals for this hospitalization and ongoing recovery are:: return home ?  ?Choice offered to / list presented to : Patient ? ?Expected Discharge Plan and Services ?Expected Discharge Plan: Home/Self Care ?In-house Referral: Clinical Social Work ?  ?  ?Living arrangements for the past 2 months: [place] ?                ?  ?  ?  ?  ?  ?  ?  ?  ?  ?  ? ?Prior Living Arrangements/Services ?Living arrangements for the past 2 months: [place] ?Lives with:: Spouse ?Patient language and need for interpreter reviewed:: Yes ?Do you feel safe going back to the place where you live?: Yes      ?Need for Family Participation in Patient Care: No (Comment) ?  ?  ?Criminal Activity/Legal Involvement Pertinent to Current Situation/Hospitalization: No - Comment as needed ? ?Activities of Daily Living ?Home Assistive Devices/Equipment: None ?ADL Screening (condition at time of admission) ?Patient's cognitive ability adequate to safely complete daily activities?: Yes ?Is the patient deaf or have difficulty hearing?: Yes ?Does the patient have difficulty seeing, even when  wearing glasses/contacts?: No ?Does the patient have difficulty concentrating, remembering, or making decisions?: No ?Patient able to express need for assistance with ADLs?: Yes ?Does the patient have difficulty dressing or bathing?: No ?Independently performs ADLs?: Yes (appropriate for developmental age) ?Does the patient have difficulty walking or climbing stairs?: No ?Weakness of Legs: Both ?Weakness of Arms/Hands: None ? ?Permission Sought/Granted ?  ?  ?   ?   ?   ?   ? ?Emotional Assessment ?  ?  ?Affect (typically observed): Appropriate ?Orientation: : Oriented to Self, Oriented to Place, Oriented to  Time, Oriented to Situation ?Alcohol / Substance Use: Not Applicable ?Psych Involvement: No (comment) ? ?Admission diagnosis:  Diverticulitis [K57.92] ?Patient Active Problem List  ? Diagnosis Date Noted  ? Diverticulitis 11/01/2021  ? HTN (hypertension) 11/01/2021  ?  Class: Chronic  ? HLD (hyperlipidemia) 11/01/2021  ?  Class: Chronic  ? Hard of hearing 11/01/2021  ?  Class: Chronic  ? Leukocytosis 11/01/2021  ?  Class: Acute  ? Factor V Leiden ([place]) 11/01/2021  ?  Class: Chronic  ? History of DVT (deep vein thrombosis) 11/01/2021  ? DM (diabetes mellitus), type 2 ([place]) 11/01/2021  ?  Class: Chronic  ? Chronic kidney disease (CKD), stage III (moderate) ([place]) 11/01/2021  ?  Class: Chronic  ? Peripheral neuropathy 11/01/2021  ? Colonic diverticular abscess 11/01/2021  ?  Class: Acute  ? Abnormal CT scan,  colon 10/31/2021  ? Iron deficiency anemia 10/31/2021  ? ?PCP:  Pomposini, Cherly Anderson, MD ?Pharmacy:   ?[facility] [address] ?[place] ?[place] [address] ?Phone: [phone] Fax: [phone] ? ? ? ? ?Social Determinants of Health (SDOH) Interventions ?  ? ?Readmission Risk Interventions ?   ? View : No data to display.  ?  ?  ?  ? ? ? ?

## 2021-11-02 NOTE — Progress Notes (Signed)
Patient called to have nurse look at his BM, previously stools had been black but this BM had a small amount of red blood. MD made aware. Pt complained of no pain.  ?

## 2021-11-02 NOTE — Progress Notes (Addendum)
?Subjective: ?Patient reports a BM this morning that was bloody and black. Last PO iron was Monday. Denies any rectal bleeding or melena prior to this. Still having some mid to L lower abdominal discomfort. Denies n/v since Monday night. Some ongoing fatigue and SOB. ? ?Objective: ?Vital signs in last 24 hours: ?Temp:  [97.9 ?F (36.6 ?C)-100.7 ?F (38.2 ?C)] 98.3 ?F (36.8 ?C) (04/05 0352) ?Pulse Rate:  [60-85] 64 (04/05 0352) ?Resp:  [15-23] 18 (04/05 0352) ?BP: (150-180)/(50-119) 156/53 (04/05 0352) ?SpO2:  [91 %-99 %] 92 % (04/05 0352) ?Weight:  [94.3 kg-94.8 kg] 94.3 kg (04/05 0352) ?Last BM Date : 11/01/21 ?General:   Alert and oriented, pleasant ?Head:  Normocephalic and atraumatic. ?Eyes:  No icterus, sclera clear. Conjuctiva pink.  ?Mouth:  Without lesions, mucosa pink and moist.   ?Heart:  S1, S2 present, no murmurs noted.  ?Lungs: Clear to auscultation bilaterally, without wheezing, rales, or rhonchi.  ?Abdomen:  Bowel sounds present, soft, non-tender, non-distended. No HSM or hernias noted. No rebound or guarding. No masses appreciated  ?Msk:  Symmetrical without gross deformities. Normal posture. ?Pulses:  Normal pulses noted. ?Extremities:  Without clubbing or edema. ?Neurologic:  Alert and  oriented x4;  grossly normal neurologically. ?Skin:  Warm and dry, intact without significant lesions.  ?Psych:  Alert and cooperative. Normal mood and affect. ? ?Intake/Output from previous day: ?04/04 0701 - 04/05 0700 ?In: 325.8 [P.O.:240; I.V.:3.6; IV Piggyback:82.2] ?Out: 2 [Urine:2] ?Intake/Output this shift: ?No intake/output data recorded. ? ?Lab Results: ?Recent Labs  ?  11/01/21 ?1023 11/02/21 ?0277  ?WBC 24.6* 20.6*  ?HGB 9.9* 8.4*  ?HCT 30.8* 26.8*  ?PLT 270 195  ? ?BMET ?Recent Labs  ?  11/01/21 ?1023 11/02/21 ?4128  ?NA 138 137  ?K 3.9 4.1  ?CL 107 107  ?CO2 21* 24  ?GLUCOSE 163* 124*  ?BUN 34* 30*  ?CREATININE 1.27* 1.33*  ?CALCIUM 8.7* 8.1*  ? ?LFT ?Recent Labs  ?  11/01/21 ?1023  ?PROT 7.2  ?ALBUMIN  3.6  ?AST 28  ?ALT 28  ?ALKPHOS 104  ?BILITOT 1.0  ? ?PT/INR ?Recent Labs  ?  11/02/21 ?7867  ?LABPROT 20.1*  ?INR 1.7*  ? ? ?Studies/Results: ?CT ABDOMEN PELVIS W CONTRAST ? ?Result Date: 11/01/2021 ?CLINICAL DATA:  Fever abdominal pain and vomiting since last night. EXAM: CT ABDOMEN AND PELVIS WITH CONTRAST TECHNIQUE: Multidetector CT imaging of the abdomen and pelvis was performed using the standard protocol following bolus administration of intravenous contrast. RADIATION DOSE REDUCTION: This exam was performed according to the departmental dose-optimization program which includes automated exposure control, adjustment of the mA and/or kV according to patient size and/or use of iterative reconstruction technique. CONTRAST:  182m OMNIPAQUE IOHEXOL 300 MG/ML  SOLN COMPARISON:  None FINDINGS: Lower chest: The lung bases are clear of acute process. No pleural effusion or pulmonary lesions. The heart is normal in size. No pericardial effusion. Aortic and coronary artery calcifications are noted. The distal esophagus and aorta are unremarkable. Hepatobiliary: No hepatic lesions or intrahepatic biliary dilatation. The gallbladder is unremarkable. No common bile duct dilatation. Pancreas: No mass, inflammation ductal dilatation. Spleen: Normal size.  No focal lesions. Adrenals/Urinary Tract: The adrenal glands are normal. There are numerous bilateral renal cysts. No worrisome renal lesions. Small right renal calculi. No hydroureteronephrosis. The bladder is unremarkable. Stomach/Bowel: The stomach, duodenum, small bowel and terminal ileum are unremarkable. The appendix is normal. There is diffuse and severe colonic diverticulosis. Significant inflammatory type changes involving the splenic flexure region the  colon. I suspect this is acute diverticulitis with a possible intramural abscess measuring 3.5 cm. Surrounding inflammatory changes involving the pericolonic fat. I do not see an obvious mass lesion but would treat  this patient for diverticulitis and recommend correlation with colon cancer screening history and possible follow-up colonoscopy if indicated. Vascular/Lymphatic: Advanced atherosclerotic calcification involving the aorta and branch vessels but no aneurysm or dissection. The major venous structures are patent. No mesenteric or retroperitoneal mass or adenopathy. Reproductive: Enlarged prostate gland with median lobe hypertrophy impressing on the base bladder. The seminal vesicles are unremarkable. Other: Small amount of free pelvic fluid is noted. Musculoskeletal: No significant bony findings. Age related osteoporosis. IMPRESSION: 1. CT findings suggest acute diverticulitis involving the splenic flexure region the colon with a probable 3.5 cm intramural abscess. 2. No obvious colonic mass. Recommend correlation with colon cancer screening history and possible follow-up colonoscopy if indicated. 3. Advanced atherosclerotic calcification involving the aorta and branch vessels. 4. Enlarged prostate gland with median lobe hypertrophy impressing on the base bladder. 5. Numerous bilateral renal cysts. 6. Small right renal calculi. Aortic Atherosclerosis (ICD10-I70.0). Electronically Signed   By: Marijo Sanes M.D.   On: 11/01/2021 12:08   ? ?Assessment: ?Herbert Wood is a 87 y.o. male with past medical history of DVT, peripheral neuropathy, DM, adenomatous polyp of colon, hearing loss, CKD 3, adenoid cystic carcinoma, BCC, factor V, maintained on eliquis, presented to ED on 4/4 for acute onset mid lower abdominal pain on Monday night with 2 episodes of non bloody emesis as well as a few BMs overnight, without melena or BRBPR. Seen by me in clinic on 4/3 for IDA, with no c/o GI symptoms at that time. ? ?Acute sigmoid diverticulitis with possible abscess:  ?Acute onset mid to lower abdominal pain, n/v/fever overnight on Monday night, CT concerning for diverticulitis w/abscess,  No obvious colonic mass, though recent CT  chest/abd/pelvis w/o contrast on 10/03/21 with irregular colonic wall thickening involving splenic flexure and new onset IDA raise continued concern for possible underlying malignancy masquerading as diverticulitis. Currently on IV antibiotics. Plan for outpatient colonoscopy in about 8 weeks for further evaluation. Notably, did have one bloody/dark BM this morning. Last PO iron was on Monday. Patient is inquiring when he can have food as he is starting to feel hungry. WBC is trending down nicely, 20.6 today from 24.6 yesterday  ?  ?IDA:  ?Found to have iron deficiency anemia in February with a hemoglobin of 8.9, ferritin 12, iron 38, iron saturation 10%. Seen by me in clinic on 4/3 with recommendations to proceed with EGD/Colonoscopy for further evaluation of IDA, PO iron increased back to BID due to worsening SOB, dizziness, fatigue since PCP had decreased down to once daily.  Denied any overt GI bleeding or melena at that time, though did have one bloody/dark BM this morning with pretty significant drop in hgb from 9.9 to 8.4 this morning, though it is possible this is secondary to hemodilution but will need to keep close monitoring of further rectal bleeding or drops in hgb.  chronically on Eliquis for hx of Factor V and previously denied NSAID use in clinic, but on admission admits to taking Aleve cold and sinus daily for the last 6 months.  No prior EGD.  Last colonoscopy in 2019 with Dr. Docia Furl with polyps per patient.  will continue with plans for outpatient endoscopic evaluation of IDA with EGD/Colonoscopy in about 8 weeks after acute diverticulitis has resolved as we cannot rule out UGI  source such as PUD or gastritis,especially in presence of chronic NSAID use.   ? ? ?Plan: ?Can advance to soft diet ?IV cipro '400mg'$  BID ?IV flagyl '500mg'$  TID ?Trend H&H daily ?Monitor for further episodes of rectal bleeding/dark stools ?Continue with planned outpatient EGD/colonoscopy, will now need to be about 8 weeks from  now ?Continue PPI BID ? ? LOS: 1 day  ? ? 11/02/2021, 9:25 AM ? ?Ladell Bey L. Alver Sorrow, MSN, APRN, AGNP-C ?Adult-Gerontology Nurse Practitioner ?[facility] ? ? ?

## 2021-11-02 NOTE — Progress Notes (Signed)
?PROGRESS NOTE ? ? ? ?Herbert Wood  DIY:[id number] DOB: Dec 11, 1934 DOA: 11/01/2021 ?PCP: Pomposini, Cherly Anderson, MD ? ? ?Brief Narrative:  ? ? ?Herbert Wood is a 87 year old male with significant past medical history of factor V Leyden deficiency, DVT (on Eliquis), CKD 3, HTN, HLD, peripheral neuropathy, DM 2, hearing loss, iron deficiency anemia ... Presented today with chief complaint of abdominal pain, nausea vomiting. ? ?Per patient his symptoms started yesterday now he is developing fever per wife he had had a Tmax of 103, last night around 11 PM.  He had 2 episode of nonbloody emesis.  Had a normal bowel movement which was nonbloody.  Bowel movement did not relieve his pain, he continued have periumbilical region pain without radiation worsened with movement. ?Patient and his wife had had [place] food last night his wife did not get sick. ?He has been recently seen by GI for iron deficiency anemia no reported history of diverticulosis or diverticulitis. ?He has had plan with Dr. Gypsy Balsam for endoscopy and colonoscopy in near future. ? ?ED; ?Blood pressure (!) 169/64, pulse 81, temperature 98.5 ?F (36.9 ?C), temperature source Oral, resp. rate (!) 23, height '5\' 10"'$  (1.778 m), weight 94.8 kg, SpO2 93 %. ? ? ?Labs:  ?CMP within normal notes with exception of glucose 163, BUN 34, creatinine 1.27, calcium 8.7, LFT within normal limits, lipase 29, ALT 28, AST 28, T. bili 1.0, lactic acid 1.4, WBC 24.6, ? ?CT abdomen/pelvis: Acute diverticulitis involving splenic flexure in the colon, and probable 3.5 cm intramural abscess.  ?-With nonspecific finding of aortic atherosclerosis, BPH, renal cyst, small renal calculi ? ? ?Findings were discussed with general surgery Dr. Constance Haw who is not concerned about abscess..  Continue medical management for now. ? ?GI consulted  ? ? ?Assessment & Plan: ?  ?Principal Problem: ?  Colonic diverticular abscess ?Active Problems: ?  Leukocytosis ?  DM (diabetes mellitus), type 2 ([place]) ?  HTN  (hypertension) ?  HLD (hyperlipidemia) ?  Hard of hearing ?  Factor V Leiden ([place]) ?  Chronic kidney disease (CKD), stage III (moderate) (HCC) ?  Iron deficiency anemia ?  History of DVT (deep vein thrombosis) ?  Peripheral neuropathy ? ?Assessment and Plan: ? ? ?Colonic diverticular abscess ?Abdominal pain  ?  ?CT abdomen/pelvis: Acute diverticulitis involving splenic flexure in the colon, and probable 3.5 cm intramural abscess.  ?-With nonspecific finding of aortic atherosclerosis, BPH, renal cyst, small renal calculi ?  ?  ?ED providers have discussed it with general surgery Dr. Constance Haw who is not concerned about abscess..  Recommending continue medical management for now, IV antibiotics. ?  ?WBC 24.6, lactic acid 1.4, on admission ?-Normotensive, with normal lactic acid ?-We will advance diet to soft today ?-Gentle IV fluid hydration ongoing ?-Continue IV antibiotics Cipro/Flagyl ?(Received Rocephin/Flagyl in ED) ?  ?-General surgery, GI consulted appreciate input; plans for outpatient follow-up and endoscopy at a later date ?  ?  ?Leukocytosis-improving ?- Likely due to diverticulitis, abscess, ?-Continue IV Cipro and Flagyl ?  ?  ?Diverticulitis ?Management as above- ?  ?DM (diabetes mellitus), type 2 ([place]) ?- We will holding home medication ?-Checking CBG QA CHS with [facility] coverage ?  ?Chronic kidney disease (CKD), stage III (moderate) (HCC) ?- BUN/creatinine at baseline ?-Avoiding nephrotoxins ?  ?Factor V Leiden ([place]) ?- Currently stable, chronically coagulated with Eliquis ?  ?Hard of hearing ?- Patient is wearing hearing aids ?-Able to communicate at this time. ?  ?HLD (hyperlipidemia) ?- Continue home dose statins ?  ?  HTN (hypertension) ?- Currently mildly hypertensive ?-Resuming home medication of Norvasc, Coreg, ?-As needed hydralazine IV ?  ?Peripheral neuropathy ?- Stable continue as needed analgesics ?  ?History of DVT (deep vein thrombosis) ?- Currently stable, no complaints continue home medication  of Eliquis ?- ?  ?Iron deficiency anemia ?- Monitoring H&H, ?-Currently stable ?  ?-Recent iron study iron 38, iron sat 10%, TIBC 383, B12 1965, ?-Continue iron supplements ?  ? ?DVT prophylaxis: Eliquis ?Code Status: Full ?Family Communication: None at bedside ?Disposition Plan:  ?Status is: Inpatient ?Remains inpatient appropriate because: Need for IV medications. ? ?Consultants:  ?GI ? ?Procedures:  ?See below ? ?Antimicrobials:  ?Anti-infectives (From admission, onward)  ? ? Start     Dose/Rate Route Frequency Ordered Stop  ? 11/01/21 2000  metroNIDAZOLE (FLAGYL) IVPB 500 mg       ? 500 mg ?100 mL/hr over 60 Minutes Intravenous Every 8 hours 11/01/21 1424    ? 11/01/21 1800  ciprofloxacin (CIPRO) IVPB 400 mg       ? 400 mg ?200 mL/hr over 60 Minutes Intravenous Every 12 hours 11/01/21 1424    ? 11/01/21 1145  cefTRIAXone (ROCEPHIN) 2 g in sodium chloride 0.9 % 100 mL IVPB       ?See Hyperspace for full Linked Orders Report.  ? 2 g ?200 mL/hr over 30 Minutes Intravenous  Once 11/01/21 1132 11/01/21 1327  ? 11/01/21 1145  metroNIDAZOLE (FLAGYL) IVPB 500 mg       ?See Hyperspace for full Linked Orders Report.  ? 500 mg ?100 mL/hr over 60 Minutes Intravenous  Once 11/01/21 1132 11/01/21 1327  ? ?  ? ? ?Subjective: ?Patient seen and evaluated today with no new acute complaints or concerns.  He denies any abdominal pain, nausea, or vomiting.  No bowel movement noted since admission.  He is tolerating his clear liquid diet.  No acute concerns or events noted overnight. ? ?Objective: ?Vitals:  ? 11/01/21 1708 11/01/21 2049 11/02/21 0029 11/02/21 0352  ?BP: (!) 180/66 (!) 150/56 (!) 154/50 (!) 156/53  ?Pulse:  60 69 64  ?Resp:  '18 19 18  '$ ?Temp:  97.9 ?F (36.6 ?C) 98.6 ?F (37 ?C) 98.3 ?F (36.8 ?C)  ?TempSrc:  Oral  Oral  ?SpO2:  96% 94% 92%  ?Weight:    94.3 kg  ?Height:      ? ? ?Intake/Output Summary (Last 24 hours) at 11/02/2021 1242 ?Last data filed at 11/02/2021 0500 ?Gross per 24 hour  ?Intake 325.79 ml  ?Output 2 ml   ?Net 323.79 ml  ? ?Filed Weights  ? 11/01/21 1010 11/01/21 1645 11/02/21 0352  ?Weight: 94.8 kg 94.8 kg 94.3 kg  ? ? ?Examination: ? ?General exam: Appears calm and comfortable  ?Respiratory system: Clear to auscultation. Respiratory effort normal. ?Cardiovascular system: S1 & S2 heard, RRR.  ?Gastrointestinal system: Abdomen is soft ?Central nervous system: Alert and awake ?Extremities: No edema ?Skin: No significant lesions noted ?Psychiatry: Flat affect. ? ? ? ?Data Reviewed: I have personally reviewed following labs and imaging studies ? ?CBC: ?Recent Labs  ?Lab 11/01/21 ?1023 11/02/21 ?5188  ?WBC 24.6* 20.6*  ?HGB 9.9* 8.4*  ?HCT 30.8* 26.8*  ?MCV 93.9 94.4  ?PLT 270 195  ? ?Basic Metabolic Panel: ?Recent Labs  ?Lab 11/01/21 ?1023 11/02/21 ?4166  ?NA 138 137  ?K 3.9 4.1  ?CL 107 107  ?CO2 21* 24  ?GLUCOSE 163* 124*  ?BUN 34* 30*  ?CREATININE 1.27* 1.33*  ?CALCIUM 8.7* 8.1*  ? ?  GFR: ?Estimated Creatinine Clearance: 46.8 mL/min (A) (by C-G formula based on SCr of 1.33 mg/dL (H)). ?Liver Function Tests: ?Recent Labs  ?Lab 11/01/21 ?1023  ?AST 28  ?ALT 28  ?ALKPHOS 104  ?BILITOT 1.0  ?PROT 7.2  ?ALBUMIN 3.6  ? ?Recent Labs  ?Lab 11/01/21 ?1023  ?LIPASE 29  ? ?No results for input(s): AMMONIA in the last 168 hours. ?Coagulation Profile: ?Recent Labs  ?Lab 11/02/21 ?5176  ?INR 1.7*  ? ?Cardiac Enzymes: ?No results for input(s): CKTOTAL, CKMB, CKMBINDEX, TROPONINI in the last 168 hours. ?BNP (last 3 results) ?No results for input(s): PROBNP in the last 8760 hours. ?HbA1C: ?No results for input(s): HGBA1C in the last 72 hours. ?CBG: ?Recent Labs  ?Lab 11/02/21 ?0806  ?GLUCAP 127*  ? ?Lipid Profile: ?No results for input(s): CHOL, HDL, LDLCALC, TRIG, CHOLHDL, LDLDIRECT in the last 72 hours. ?Thyroid Function Tests: ?No results for input(s): TSH, T4TOTAL, FREET4, T3FREE, THYROIDAB in the last 72 hours. ?Anemia Panel: ?No results for input(s): VITAMINB12, FOLATE, FERRITIN, TIBC, IRON, RETICCTPCT in the last 72  hours. ?Sepsis Labs: ?Recent Labs  ?Lab 11/01/21 ?1023  ?LATICACIDVEN 1.4  ? ? ?Recent Results (from the past 240 hour(s))  ?Culture, blood (routine x 2)     Status: None (Preliminary result)  ? Collection Time: 04/

## 2021-11-03 DIAGNOSIS — K5720 Diverticulitis of large intestine with perforation and abscess without bleeding: Secondary | ICD-10-CM | POA: Diagnosis not present

## 2021-11-03 LAB — HEMOGLOBIN AND HEMATOCRIT, BLOOD
HCT: 26.1 % — ABNORMAL LOW (ref 39.0–52.0)
Hemoglobin: 8.4 g/dL — ABNORMAL LOW (ref 13.0–17.0)

## 2021-11-03 LAB — CBC
HCT: 24.7 % — ABNORMAL LOW (ref 39.0–52.0)
Hemoglobin: 7.7 g/dL — ABNORMAL LOW (ref 13.0–17.0)
MCH: 29.4 pg (ref 26.0–34.0)
MCHC: 31.2 g/dL (ref 30.0–36.0)
MCV: 94.3 fL (ref 80.0–100.0)
Platelets: 179 10*3/uL (ref 150–400)
RBC: 2.62 MIL/uL — ABNORMAL LOW (ref 4.22–5.81)
RDW: 18.4 % — ABNORMAL HIGH (ref 11.5–15.5)
WBC: 14.7 10*3/uL — ABNORMAL HIGH (ref 4.0–10.5)
nRBC: 0 % (ref 0.0–0.2)

## 2021-11-03 LAB — MAGNESIUM: Magnesium: 1.7 mg/dL (ref 1.7–2.4)

## 2021-11-03 LAB — BASIC METABOLIC PANEL
Anion gap: 7 (ref 5–15)
BUN: 31 mg/dL — ABNORMAL HIGH (ref 8–23)
CO2: 22 mmol/L (ref 22–32)
Calcium: 8.1 mg/dL — ABNORMAL LOW (ref 8.9–10.3)
Chloride: 106 mmol/L (ref 98–111)
Creatinine, Ser: 1.5 mg/dL — ABNORMAL HIGH (ref 0.61–1.24)
GFR, Estimated: 45 mL/min — ABNORMAL LOW (ref >60)
Glucose, Bld: 133 mg/dL — ABNORMAL HIGH (ref 70–99)
Potassium: 4 mmol/L (ref 3.5–5.1)
Sodium: 135 mmol/L (ref 135–145)

## 2021-11-03 LAB — GLUCOSE, CAPILLARY: Glucose-Capillary: 116 mg/dL — ABNORMAL HIGH (ref 70–99)

## 2021-11-03 MED ORDER — LACTATED RINGERS IV SOLN
INTRAVENOUS | Status: AC
Start: 2021-11-03 — End: 2021-11-04

## 2021-11-03 MED ORDER — APIXABAN 5 MG PO TABS
5.0000 mg | ORAL_TABLET | Freq: Two times a day (BID) | ORAL | Status: DC
Start: 2021-11-03 — End: 2021-11-04
  Administered 2021-11-03 – 2021-11-04 (×3): 5 mg via ORAL
  Filled 2021-11-03 (×3): qty 1

## 2021-11-03 NOTE — Discharge Instructions (Signed)
Low Fiber Nutrition Therapy  ? ?You may need a low-fiber diet if you have Crohn's disease, diverticulitis, gastroparesis, ulcerative colitis, a new colostomy, or new ileostomy. A low-fiber diet may also be needed following radiation therapy to the pelvis and lower bowel or recent intestinal surgery. ? ?A low-fiber diet reduces the frequency and volume of your stools. This lessens irritation to the gastrointestinal (GI) tract and can help you heal. Use this diet if you have a stricture so your intestine doesn't get blocked. The goal of this diet is to get less than 8 grams of fiber daily. It's also important to eat enough protein foods while you are on a low-fiber diet. ? ?Drink nutrition supplements that have 1 gram of fiber or less in each serving. If your stricture is severe or if your inflammation is severe, drink more liquids to reduce symptoms and to get enough calories and protein. ? ?Tips ?Eat about 5 to 6 small meals daily or about every 3 to 4 hours. Do not skip meals.  ?Every time you eat, include a small amount of protein (1 to 2 ounces) plus an additional food. Low fiber starch foods are the best choice to eat with protein.  ?Limit acidic, spicy and high-fat or fried and greasy foods to reduce GI symptoms.  ?Do not eat raw fruits and vegetables while on this diet. All fruits and vegetables need to be cooked and without peels or skins.  ?Drink a lot of fluids, at least 8 cups of fluid each day. Limit drinks with caffeine, sugar, and sugar substitutes.  ?Plain water is the best choice. Avoid mixing drink packets or flavor drops into water. [place] Kitchen  ?Take a chewable multivitamin with minerals. Gummy vitamins do not have enough minerals and can block an ostomy and non-chewable supplements are not easily digested. Chewable supplements must be used if you have a stricture or ostomy.  ?If you are lactose intolerant, you may need to eat low-lactose dairy products. If you can't tolerate dairy, ask your RDN about how  you can get enough calcium from other foods.  ?Do not take a calcium supplement. They can cause a blockage.  ?It is important to add high-calcium foods gradually to your diet and monitor for symptoms to avoid a blockage.  ?Do not add more fiber to your diet until your health care provider or registered dietitian nutritionist (RDN) tells you it's OK. Fiber is part of whole grains, fruits and vegetables (foods from plants) and needs to be slowly added back in to your diet when your body is healed.  ?Choose foods that have been safely handled and prepared to lower your risk of foodborne illness. Talk to your RDN or see the Food Safety Nutrition Therapy handout for more information.  ? ?Foods Recommended ?These foods are low in fat and fiber and will help with your GI symptoms. ?Food Group Foods Recommended  ?Grains  Choose grain foods with less than 2 grams of fiber per serving. ?Refined white flour products--for example, enriched white bread without seeds, crackers or pasta ?Cream of wheat or rice ?Grits (fine ground) ?Tortillas: white flour or corn ?White rice, well-cooked (do not rinse, or soak before cooking) ?Cold and hot cereals made from white or refined flour such as puffed rice or corn flakes  ?Protein Foods  Lean, very tender, well-cooked poultry or fish; red meats: beef, pork or lamb (slow cook until soft; chop meats if you have stricture or ostomy) ?Eggs, well-cooked ?Smooth nut butters such as almond,  peanut, or sunflower ?Tofu  ?Dairy  If you have lactose intolerance, drinking milk products from cows or goats may make diarrhea worse. Foods marked with an asterisk (*) have lactose. ?Milk: fat-free, 1% or 2% * (choose best tolerated) ?Lactose-free milk ?Buttermilk* ?Fortified non-dairy milks: almond, cashew, coconut, or rice (be aware that these options are not good sources of protein so you will need to eat an additional protein food) ?Kefir* (Don't include kefir in the diet until approved by your health  care provider) ?Yogurt*/lactose-free yogurt (without nuts, fruit, granola or chocolate) ?Mild cheese* (hard and aged cheeses tend to be lower in lactose such as cheddar, swiss or parmesan) ?Cottage cheese* or lactose-free cottage cheese ?Low-fat ice cream* or lactose-free ice cream ?Sherbet* (usually lower lactose)  ?Vegetables  Canned and well-cooked vegetables without seeds, skins, or hulls  ?Carrots or green beans, cooked ?White, red or yellow potatoes without skins ?Strained vegetable juice  ?Fruit Soft, and well-cooked fruits without skins, seeds, or membranes ?Canned fruit in juice: peaches, pears, or applesauce ?Fruit juice without pulp diluted by half with water may be tolerated better ?Fruit drinks fortified with vitamin C may be tolerated better than 100% fruit juice  ?Oils  When possible, choose healthy oils and fats, such as olive and canola oils, plant oils rather than solid fats.  ?Other  Broth and strained soups made from allowed foods ?Desserts (small portions) without whole grains, seeds, nuts, raisins, or coconut ?Jelly (clear)  ? ?Foods Not Recommended ?These foods are higher in fat and fiber and may make your GI symptoms worse.  ?Food Group Foods Not Recommended  ?Grains  Bread, whole wheat or with whole grain flour or seeds or nuts ?Brown rice, quinoa, kasha, barley ?Tortillas: whole grain ?Whole wheat pasta ?Whole grain and high-fiber cereals, including oatmeal, bran flakes or shredded wheat ?Popcorn  ?Protein Foods  Steak, pork chops, or other meats that are fatty or have gristle ?Fried meat, Sales executive, or fish ?Seafood with a tough or rubbery texture, such as shrimp ?Luncheon meats such as bologna and salami ?Sausage, bacon, or hot dogs ?Dried beans, peas, or lentils ?Hummus ?Sushi ?Nuts and chunky nut butters  ?Dairy  Whole milk ?Pea milk and soymilk (may cause diarrhea, gas, bloating, and abdominal pain) ?Cream ?Half-and-half ?Sour cream ?Yogurt with added fruit, nuts, or granola or chocolate   ?Vegetables  Alfalfa or bean sprouts (high fiber and risk for bacteria) ?Raw or undercooked vegetables: beets; broccoli; brussels sprouts; cabbage; cauliflower; collard, mustard, or turnip greens; corn; cucumber; green peas or any kind of peas; kale; lima beans; mushrooms; okra; olives; pickles and relish; onions; parsnips; peppers; potato skins; sauerkraut; spinach; tomatoes  ?Fruit Raw fruit ?Dried fruit ?Avocado, berries, coconut ?Canned fruit in syrup ?Canned fruit with mandarin oranges, papaya or pineapple ?Fruit juice with pulp ?Prune juice ?Fruit skin  ?Oils  Pork rinds  ? ?Low-Fiber (8 grams) Sample 1-Day Menu  ?Breakfast ? cup cream of wheat (0.5 gram fiber)  ?1 slice white toast (1 gram fiber)  ?1 teaspoon margarine, soft tub  ?2 scrambled eggs   ?Morning Snack 1 cup lactose-free nutrition supplement  ?Lunch 2 slices white bread (2 grams fiber)  ?3 tablespoons tuna  ?1 tablespoon mayonnaise  ?1 cup chicken noodle soup (1 gram fiber)  ?? cup apple juice   ?Afternoon Snack 6 saltine crackers (0.5 gram fiber)  ?2 ounces low-fat cheddar cheese  ?Evening Meal 3 ounces tender chicken breast  ?1 cup white rice (0.5 gram fiber)  ?? cup  cooked canned green beans (2 grams fiber)  ?? cup cranberry juice   ?Evening Snack 1 cup lactose-free nutrition supplement  ? ?Copyright 2020 ? Academy of [employer]etics ? ?High Fiber Nutrition Therapy  ?Fiber and fluid may help you feel less constipated and bloated and can also help ease diarrhea. Increase fiber slowly over the course of a few weeks. This will keep your symptoms from getting worse. ?Tips ?Tips for Adding Fiber to Your Eating Plan ?Slowly increase the amount of fiber you eat to 25 to 35 grams per day. ?Eat whole grain breads and cereals. Look for choices with 100% whole wheat, rye, oats, or bran as the first or second ingredient. ?Have brown or wild rice instead of white rice or potatoes. ?Enjoy a variety of grains. Good choices include barley, oats,  farro, kamut, and quinoa. ?Bake with whole wheat flour. You can use it to replace some white or all-purpose flour in recipes. ?Enjoy baked beans more often! Add dried beans and peas to casseroles or soups. ?

## 2021-11-03 NOTE — Progress Notes (Signed)
?PROGRESS NOTE ? ? ? ?Herbert Wood  RSW:[id number] DOB: 27-Feb-1935 DOA: 11/01/2021 ?PCP: Pomposini, Cherly Anderson, MD ? ? ?Brief Narrative:  ? ? ?Herbert Wood is a 87 year old male with significant past medical history of factor V Leyden deficiency, DVT (on Eliquis), CKD 3, HTN, HLD, peripheral neuropathy, DM 2, hearing loss, iron deficiency anemia ... Presented today with chief complaint of abdominal pain, nausea vomiting. ? ?Per patient his symptoms started yesterday now he is developing fever per wife he had had a Tmax of 103, last night around 11 PM.  He had 2 episode of nonbloody emesis.  Had a normal bowel movement which was nonbloody.  Bowel movement did not relieve his pain, he continued have periumbilical region pain without radiation worsened with movement. ?Patient and his wife had had [place] food last night his wife did not get sick. ?He has been recently seen by GI for iron deficiency anemia no reported history of diverticulosis or diverticulitis. ?He has had plan with Dr. Gypsy Balsam for endoscopy and colonoscopy in near future. ? ?ED; ?Blood pressure (!) 169/64, pulse 81, temperature 98.5 ?F (36.9 ?C), temperature source Oral, resp. rate (!) 23, height '5\' 10"'$  (1.778 m), weight 94.8 kg, SpO2 93 %. ? ? ?Labs:  ?CMP within normal notes with exception of glucose 163, BUN 34, creatinine 1.27, calcium 8.7, LFT within normal limits, lipase 29, ALT 28, AST 28, T. bili 1.0, lactic acid 1.4, WBC 24.6, ? ?CT abdomen/pelvis: Acute diverticulitis involving splenic flexure in the colon, and probable 3.5 cm intramural abscess.  ?-With nonspecific finding of aortic atherosclerosis, BPH, renal cyst, small renal calculi ? ? ?Findings were discussed with general surgery Dr. Constance Haw who is not concerned about abscess..  Continue medical management for now. ? ?GI consulted  ? ? ?Assessment & Plan: ?  ?Principal Problem: ?  Colonic diverticular abscess ?Active Problems: ?  Leukocytosis ?  DM (diabetes mellitus), type 2 ([place]) ?  HTN  (hypertension) ?  HLD (hyperlipidemia) ?  Hard of hearing ?  Factor V Leiden ([place]) ?  Chronic kidney disease (CKD), stage III (moderate) (HCC) ?  Iron deficiency anemia ?  History of DVT (deep vein thrombosis) ?  Peripheral neuropathy ?  NSAID long-term use ? ?Assessment and Plan: ? ? ?Colonic diverticular abscess ?Abdominal pain  ?  ?CT abdomen/pelvis: Acute diverticulitis involving splenic flexure in the colon, and probable 3.5 cm intramural abscess.  ?-With nonspecific finding of aortic atherosclerosis, BPH, renal cyst, small renal calculi ?  ?  ?ED providers have discussed it with general surgery Dr. Constance Haw who is not concerned about abscess..  Recommending continue medical management for now, IV antibiotics. ?  ?WBC 24.6, lactic acid 1.4, on admission; WBC downtrending ?-Normotensive, with normal lactic acid ?-We will advance diet to soft today ?-Gentle IV fluid hydration ongoing ?-Continue IV antibiotics Cipro/Flagyl ?(Received Rocephin/Flagyl in ED) ?  ?-General surgery, GI consulted appreciate input; plans for outpatient follow-up and endoscopy at a later date ? ?-Rectal bleed noted 4/5 for which Eliquis was held; H/H stable today so plan to resume and monitor ?  ?Leukocytosis-improving ?- Likely due to diverticulitis, abscess, ?-Continue IV Cipro and Flagyl ?  ?Diverticulitis ?Management as above- ?  ?DM (diabetes mellitus), type 2 ([place]) ?- We will holding home medication ?-Checking CBG QA CHS with [facility] coverage ?  ?Chronic kidney disease (CKD), stage III (moderate) (HCC) ?- BUN/creatinine at baseline ?-Avoiding nephrotoxins ?  ?Factor V Leiden ([place]) ?- Currently stable, chronically coagulated with Eliquis ?  ?Hard of  hearing ?- Patient is wearing hearing aids ?-Able to communicate at this time. ?  ?HLD (hyperlipidemia) ?- Continue home dose statins ?  ?HTN (hypertension) ?- Currently mildly hypertensive ?-Resuming home medication of Norvasc, Coreg, ?-As needed hydralazine IV ?  ?Peripheral neuropathy ?-  Stable continue as needed analgesics ?  ?History of DVT (deep vein thrombosis) ?- Currently stable, no complaints continue home medication of Eliquis ?- ?  ?Iron deficiency anemia ?- Monitoring H&H, ?-Currently stable ?  ?-Recent iron study iron 38, iron sat 10%, TIBC 383, B12 1965, ?-Continue iron supplementation as ordered ?-Resumed Eliquis 4/6; continue to monitor ?  ?  ?DVT prophylaxis: Eliquis resumed ?Code Status: Full ?Family Communication: None at bedside ?Disposition Plan:  ?Status is: Inpatient ?Remains inpatient appropriate because: Need for IV medications. ?  ?Consultants:  ?GI ?  ?Procedures:  ?See below ?  ? ?Antimicrobials:  ?Anti-infectives (From admission, onward)  ? ? Start     Dose/Rate Route Frequency Ordered Stop  ? 11/01/21 2000  metroNIDAZOLE (FLAGYL) IVPB 500 mg       ? 500 mg ?100 mL/hr over 60 Minutes Intravenous Every 8 hours 11/01/21 1424    ? 11/01/21 1800  ciprofloxacin (CIPRO) IVPB 400 mg       ? 400 mg ?200 mL/hr over 60 Minutes Intravenous Every 12 hours 11/01/21 1424    ? 11/01/21 1145  cefTRIAXone (ROCEPHIN) 2 g in sodium chloride 0.9 % 100 mL IVPB       ?See Hyperspace for full Linked Orders Report.  ? 2 g ?200 mL/hr over 30 Minutes Intravenous  Once 11/01/21 1132 11/01/21 1327  ? 11/01/21 1145  metroNIDAZOLE (FLAGYL) IVPB 500 mg       ?See Hyperspace for full Linked Orders Report.  ? 500 mg ?100 mL/hr over 60 Minutes Intravenous  Once 11/01/21 1132 11/01/21 1327  ? ?  ? ? ?Subjective: ?Patient seen and evaluated today with no new acute complaints or concerns. Noted to have an episode of rectal bleeding yesterday. No further active bleeding noted. ? ?Objective: ?Vitals:  ? 11/02/21 0352 11/02/21 1454 11/02/21 2208 11/03/21 0509  ?BP: (!) 156/53 (!) 163/58 (!) 133/46 (!) 178/55  ?Pulse: 64 61 62 83  ?Resp: '18 18 20 15  '$ ?Temp: 98.3 ?F (36.8 ?C) (!) 97.3 ?F (36.3 ?C) 98.4 ?F (36.9 ?C) 98.3 ?F (36.8 ?C)  ?TempSrc: Oral Oral Oral Oral  ?SpO2: 92% 95% 94% 96%  ?Weight: 94.3 kg   95.7 kg   ?Height:      ? ? ?Intake/Output Summary (Last 24 hours) at 11/03/2021 1304 ?Last data filed at 11/03/2021 0907 ?Gross per 24 hour  ?Intake 1431.63 ml  ?Output --  ?Net 1431.63 ml  ? ?Filed Weights  ? 11/01/21 1645 11/02/21 0352 11/03/21 0509  ?Weight: 94.8 kg 94.3 kg 95.7 kg  ? ? ?Examination: ? ?General exam: Appears calm and comfortable  ?Respiratory system: Clear to auscultation. Respiratory effort normal. ?Cardiovascular system: S1 & S2 heard, RRR.  ?Gastrointestinal system: Abdomen is soft ?Central nervous system: Alert and awake ?Extremities: No edema ?Skin: No significant lesions noted ?Psychiatry: Flat affect. ? ? ? ?Data Reviewed: I have personally reviewed following labs and imaging studies ? ?CBC: ?Recent Labs  ?Lab 11/01/21 ?1023 11/02/21 ?0383 11/03/21 ?3383 11/03/21 ?1228  ?WBC 24.6* 20.6* 14.7*  --   ?HGB 9.9* 8.4* 7.7* 8.4*  ?HCT 30.8* 26.8* 24.7* 26.1*  ?MCV 93.9 94.4 94.3  --   ?PLT 270 195 179  --   ? ?Basic  Metabolic Panel: ?Recent Labs  ?Lab 11/01/21 ?1023 11/02/21 ?6803 11/03/21 ?2122  ?NA 138 137 135  ?K 3.9 4.1 4.0  ?CL 107 107 106  ?CO2 21* 24 22  ?GLUCOSE 163* 124* 133*  ?BUN 34* 30* 31*  ?CREATININE 1.27* 1.33* 1.50*  ?CALCIUM 8.7* 8.1* 8.1*  ?MG  --   --  1.7  ? ?GFR: ?Estimated Creatinine Clearance: 41.8 mL/min (A) (by C-G formula based on SCr of 1.5 mg/dL (H)). ?Liver Function Tests: ?Recent Labs  ?Lab 11/01/21 ?1023  ?AST 28  ?ALT 28  ?ALKPHOS 104  ?BILITOT 1.0  ?PROT 7.2  ?ALBUMIN 3.6  ? ?Recent Labs  ?Lab 11/01/21 ?1023  ?LIPASE 29  ? ?No results for input(s): AMMONIA in the last 168 hours. ?Coagulation Profile: ?Recent Labs  ?Lab 11/02/21 ?4825  ?INR 1.7*  ? ?Cardiac Enzymes: ?No results for input(s): CKTOTAL, CKMB, CKMBINDEX, TROPONINI in the last 168 hours. ?BNP (last 3 results) ?No results for input(s): PROBNP in the last 8760 hours. ?HbA1C: ?No results for input(s): HGBA1C in the last 72 hours. ?CBG: ?Recent Labs  ?Lab 11/02/21 ?0806 11/03/21 ?0727  ?GLUCAP 127* 116*  ? ?Lipid  Profile: ?No results for input(s): CHOL, HDL, LDLCALC, TRIG, CHOLHDL, LDLDIRECT in the last 72 hours. ?Thyroid Function Tests: ?No results for input(s): TSH, T4TOTAL, FREET4, T3FREE, THYROIDAB in the last 72

## 2021-11-03 NOTE — Progress Notes (Signed)
? ?Gastroenterology Progress Note  ? ?Referring Provider: No ref. provider found ?Primary Care Physician:  Clinton Quant, MD ?Primary Gastroenterologist:  Dr. Jenetta Downer (newly assigned) ? ?Patient ID: Herbert Wood; [id number]; May 14, 1935  ? ? ?Subjective  ? ?Patient denies any abdominal pain. Reports tolerating his soft diet well. Patient and wife state that the dietician has been in to see them. He reports feeling better since the iron infusion last night. Decreased shortness of breath. Denies chest pain. Reported some dark stools and some red streaking with his bowel movement this morning. Nursing nor wife able to see it as he flushed it. Denies any dizziness.  ? ? ?Objective  ? ?Vital signs in last 24 hours ?Temp:  [97.3 ?F (36.3 ?C)-98.4 ?F (36.9 ?C)] 98.3 ?F (36.8 ?C) (04/06 0509) ?Pulse Rate:  [61-83] 83 (04/06 0509) ?Resp:  [15-20] 15 (04/06 0509) ?BP: (133-178)/(46-58) 178/55 (04/06 0509) ?SpO2:  [94 %-96 %] 96 % (04/06 0509) ?Weight:  [95.7 kg] 95.7 kg (04/06 0509) ?Last BM Date : 11/02/21 ? ?Physical Exam ?General:   Alert and oriented, pleasant ?Head:  Normocephalic and atraumatic. ?Eyes:  No icterus, sclera clear. Conjuctiva pink.  ?Heart:  S1, S2 present, no murmurs noted.  ?Lungs: Clear to auscultation bilaterally, without wheezing, rales, or rhonchi.  ?Abdomen:  Bowel sounds present, soft, non-tender, non-distended. No HSM or hernias noted. No rebound or guarding. No masses appreciated  ?Msk:  Symmetrical without gross deformities. Normal posture. ?Pulses:  Normal pulses noted. ?Extremities:  Without clubbing or edema. ?Neurologic:  Alert and  oriented x4;  grossly normal neurologically. ?Skin:  Warm and dry, intact without significant lesions.  ?Psych:  Alert and cooperative. Normal mood and affect. ? ?Intake/Output from previous day: ?04/05 0701 - 04/06 0700 ?In: 1428.6 [I.V.:550; IV Piggyback:878.6] ?Out: -  ?Intake/Output this shift: ?Total I/O ?In: 3 [I.V.:3] ?Out: -  ? ?Lab Results ? ?Recent  Labs  ?  11/01/21 ?1023 11/02/21 ?8676 11/03/21 ?1950  ?WBC 24.6* 20.6* 14.7*  ?HGB 9.9* 8.4* 7.7*  ?HCT 30.8* 26.8* 24.7*  ?PLT 270 195 179  ? ?BMET ?Recent Labs  ?  11/01/21 ?1023 11/02/21 ?9326 11/03/21 ?7124  ?NA 138 137 135  ?K 3.9 4.1 4.0  ?CL 107 107 106  ?CO2 21* 24 22  ?GLUCOSE 163* 124* 133*  ?BUN 34* 30* 31*  ?CREATININE 1.27* 1.33* 1.50*  ?CALCIUM 8.7* 8.1* 8.1*  ? ?LFT ?Recent Labs  ?  11/01/21 ?1023  ?PROT 7.2  ?ALBUMIN 3.6  ?AST 28  ?ALT 28  ?ALKPHOS 104  ?BILITOT 1.0  ? ?PT/INR ?Recent Labs  ?  11/02/21 ?5809  ?LABPROT 20.1*  ?INR 1.7*  ? ?Hepatitis Panel ?No results for input(s): HEPBSAG, HCVAB, HEPAIGM, HEPBIGM in the last 72 hours. ? ? ?Studies/Results ?CT ABDOMEN PELVIS W CONTRAST ? ?Result Date: 11/01/2021 ?CLINICAL DATA:  Fever abdominal pain and vomiting since last night. EXAM: CT ABDOMEN AND PELVIS WITH CONTRAST TECHNIQUE: Multidetector CT imaging of the abdomen and pelvis was performed using the standard protocol following bolus administration of intravenous contrast. RADIATION DOSE REDUCTION: This exam was performed according to the departmental dose-optimization program which includes automated exposure control, adjustment of the mA and/or kV according to patient size and/or use of iterative reconstruction technique. CONTRAST:  153m OMNIPAQUE IOHEXOL 300 MG/ML  SOLN COMPARISON:  None FINDINGS: Lower chest: The lung bases are clear of acute process. No pleural effusion or pulmonary lesions. The heart is normal in size. No pericardial effusion. Aortic and coronary artery calcifications are noted.  The distal esophagus and aorta are unremarkable. Hepatobiliary: No hepatic lesions or intrahepatic biliary dilatation. The gallbladder is unremarkable. No common bile duct dilatation. Pancreas: No mass, inflammation ductal dilatation. Spleen: Normal size.  No focal lesions. Adrenals/Urinary Tract: The adrenal glands are normal. There are numerous bilateral renal cysts. No worrisome renal lesions.  Small right renal calculi. No hydroureteronephrosis. The bladder is unremarkable. Stomach/Bowel: The stomach, duodenum, small bowel and terminal ileum are unremarkable. The appendix is normal. There is diffuse and severe colonic diverticulosis. Significant inflammatory type changes involving the splenic flexure region the colon. I suspect this is acute diverticulitis with a possible intramural abscess measuring 3.5 cm. Surrounding inflammatory changes involving the pericolonic fat. I do not see an obvious mass lesion but would treat this patient for diverticulitis and recommend correlation with colon cancer screening history and possible follow-up colonoscopy if indicated. Vascular/Lymphatic: Advanced atherosclerotic calcification involving the aorta and branch vessels but no aneurysm or dissection. The major venous structures are patent. No mesenteric or retroperitoneal mass or adenopathy. Reproductive: Enlarged prostate gland with median lobe hypertrophy impressing on the base bladder. The seminal vesicles are unremarkable. Other: Small amount of free pelvic fluid is noted. Musculoskeletal: No significant bony findings. Age related osteoporosis. IMPRESSION: 1. CT findings suggest acute diverticulitis involving the splenic flexure region the colon with a probable 3.5 cm intramural abscess. 2. No obvious colonic mass. Recommend correlation with colon cancer screening history and possible follow-up colonoscopy if indicated. 3. Advanced atherosclerotic calcification involving the aorta and branch vessels. 4. Enlarged prostate gland with median lobe hypertrophy impressing on the base bladder. 5. Numerous bilateral renal cysts. 6. Small right renal calculi. Aortic Atherosclerosis (ICD10-I70.0). Electronically Signed   By: Marijo Sanes M.D.   On: 11/01/2021 12:08   ? ?Assessment  ?87 y.o. male with a history of DVT, peripheral neuropathy, DM, adenomatous polyp of colon, hearing loss, CKD 3, adenoid cystic carcinoma,  BCC, factor V Leiden deficiency maintained on Eliquis who presented to the ED on 4 4 for acute onset mid lower abdominal pain with 2 episodes of nonbloody emesis and a few BMs overnight without melena or BRBPR.  He was seen in the clinic on 4/3 for IDA with no complaints of GI symptoms at that time.  ? ?Acute sigmoid diverticulitis with possible abscess: Patient had acute onset mid to lower abdominal pain without nausea vomiting or fever on Monday night 4/3.  CT concern for diverticulitis with abscess, no obvious colonic mass, the recent CT chest/abdomen/pelvis W/O contrast on 10/03/2021 with irregular colonic wall thickening involving splenic flexure and new onset IDA raise concern for possible underlying malignancy disguising is diverticulitis.  He is currently on IV Cipro and Flagyl.  We will plan for outpatient colonoscopy in about 8 weeks for further evaluation once acute infection resolved.  WBC continues to downtrend, 24.6 on admission, currently 14.7 and has remained afebrile. Low residue diet recommended.  ? ?IDA: He was found to have IDA in February with Hgb of 8.9, ferritin 12, iron 38, iron sat 10%.  He was recently seen in clinic on Monday 4/3 with Vikki Ports:, NP with recommendations to proceed with EGD/colonoscopy for further evaluation of IDA.  His PCP had recently decreased his iron down to once daily for which she recommended to increase iron back to twice daily given he has been symptomatic with shortness of breath, dizziness, and fatigue.  On presentation to the ED he had denied any overt GI bleeding however has reported 1-2 bloody/dark BMs this admission.  Hemoglobin on admission 9.9 with a drop to 8.4 yesterday and further drop to 7.7 this morning.  He was given a dose of IV iron yesterday evening and is scheduled to receive a second dose this evening.  Repeat H/H at noon noted increase in hemoglobin to 8.4.  He is on chronic anticoagulation for history of factor V.  At his recent clinic visit he  denied any frequent NSAID use however admitted on admission to taking Aleve cold and sinus daily for 6 months.  He has no history of prior EGD last colonoscopy was in 2019 with polyps per the patient. He has

## 2021-11-03 NOTE — Progress Notes (Signed)
Patient had bowel movement.  No blood noted in stool.  ?

## 2021-11-03 NOTE — Plan of Care (Signed)
Nutrition Education Note ? ?RD consulted for nutrition education regarding nutrition management for "low residue diet" for diverticulosis/ Diverticulitis.  ? ?Pt reports that he eats out for a majority of his 3 daily meals. This typically includes [place] and [place] restaurants. He eats foods such as biscuits in the morning, and for other meals he has a variety of vegetables and pasta with sauces and breads. He does not typically eat whole grains or fruits. We discussed foods that contain fiber such as uncooked vegetables/fruits, nuts and whole grains. We covered foods that he enjoys eating and recommended well-cooked vegetables, baked/grilled meats, and white breads, pastas and rice. Spoke about the importance of low fiber diet to help avoid inflammation and that this is only short term until his MD determines he can slowly add fiber back into his diet again.  ?  ?RD provided "Low Fiber Nutrition Therapy"  handout from the Academy of [employer]. Reviewed patient's dietary recall and discussed ways for pt to meet nutrition goals over the next several weeks. Explained reasons for pt to follow a low fiber diet. Reviewed low fiber foods and high fiber foods. Discussed best practice for long term management of diverticulosis is a high fiber diet and discussed ways to gradually increase fiber in the diet.  ? ?Teach back method used. Pt verbalizes understanding of information provided.  ? ?Expect good compliance. ? ?Body mass index is Body mass index is 30.28 kg/m?Marland Kitchen. Pt meets criteria for obesity based on current BMI. ? ?Current diet order is Soft diet, patient is consuming approximately 100% of meals at this time. Labs and medications reviewed. No further nutrition interventions warranted at this time. RD contact information provided. If additional nutrition issues arise, please re-consult RD. ? ?Clayborne Dana, RDN, LDN ?Clinical Nutrition ? ?

## 2021-11-04 ENCOUNTER — Telehealth: Payer: Self-pay | Admitting: Internal Medicine

## 2021-11-04 DIAGNOSIS — K5720 Diverticulitis of large intestine with perforation and abscess without bleeding: Secondary | ICD-10-CM | POA: Diagnosis not present

## 2021-11-04 LAB — BASIC METABOLIC PANEL
Anion gap: 8 (ref 5–15)
BUN: 32 mg/dL — ABNORMAL HIGH (ref 8–23)
CO2: 21 mmol/L — ABNORMAL LOW (ref 22–32)
Calcium: 8.4 mg/dL — ABNORMAL LOW (ref 8.9–10.3)
Chloride: 109 mmol/L (ref 98–111)
Creatinine, Ser: 1.68 mg/dL — ABNORMAL HIGH (ref 0.61–1.24)
GFR, Estimated: 40 mL/min — ABNORMAL LOW (ref >60)
Glucose, Bld: 112 mg/dL — ABNORMAL HIGH (ref 70–99)
Potassium: 3.9 mmol/L (ref 3.5–5.1)
Sodium: 138 mmol/L (ref 135–145)

## 2021-11-04 LAB — CBC
HCT: 25.6 % — ABNORMAL LOW (ref 39.0–52.0)
Hemoglobin: 8 g/dL — ABNORMAL LOW (ref 13.0–17.0)
MCH: 29.9 pg (ref 26.0–34.0)
MCHC: 31.3 g/dL (ref 30.0–36.0)
MCV: 95.5 fL (ref 80.0–100.0)
Platelets: 191 10*3/uL (ref 150–400)
RBC: 2.68 MIL/uL — ABNORMAL LOW (ref 4.22–5.81)
RDW: 18.3 % — ABNORMAL HIGH (ref 11.5–15.5)
WBC: 9.1 10*3/uL (ref 4.0–10.5)
nRBC: 0 % (ref 0.0–0.2)

## 2021-11-04 LAB — GLUCOSE, CAPILLARY
Glucose-Capillary: 116 mg/dL — ABNORMAL HIGH (ref 70–99)
Glucose-Capillary: 211 mg/dL — ABNORMAL HIGH (ref 70–99)

## 2021-11-04 MED ORDER — METRONIDAZOLE 500 MG PO TABS: 500.0000 mg | ORAL_TABLET | Freq: Three times a day (TID) | ORAL | 0 refills | Status: AC

## 2021-11-04 MED ORDER — CIPROFLOXACIN HCL 500 MG PO TABS: 500.0000 mg | ORAL_TABLET | Freq: Two times a day (BID) | ORAL | 0 refills | Status: AC

## 2021-11-04 NOTE — Telephone Encounter (Signed)
Hi Ann, can you please arrange hospital follow-up visit for this patient with either [facility] or Dr. Loletha Grayer?  He will need EGD and colonoscopy for diverticulitis, iron deficiency anemia in approximately 8 weeks.  Thank you ?

## 2021-11-04 NOTE — Care Management Important Message (Signed)
Important Message ? ?Patient Details  ?Name: Herbert Wood ?MRN: [id number] ?Date of Birth: August 10, 1934 ? ? ?Medicare Important Message Given:  Other (see comment) ? ?Attempted to review Medicare IM with patient via room phone. Unable to reach at this time. ? ? ?Dannette Barbara ?11/04/2021, 11:11 AM ?

## 2021-11-04 NOTE — Progress Notes (Signed)
Patient has had uneventful night.  Has rested well, no complaints or issues during night. ?

## 2021-11-04 NOTE — Discharge Summary (Signed)
Physician Discharge Summary  ?Herbert Wood ONG:[id number] DOB: [birth year] DOA: 11/01/2021 ? ?PCP: Pomposini, Cherly Anderson, MD ? ?Admit date: 11/01/2021 ? ?Discharge date: 11/04/2021 ? ?Admitted From:Home ? ?Disposition:  Home ? ?Recommendations for Outpatient Follow-up:  ?Follow up with PCP in 1-2 weeks ?Follow-up with gastroenterology in 8 weeks for EGD and colonoscopy which will be scheduled ?Continue ciprofloxacin and Flagyl as prescribed for 10 more days to complete total 14-day course of treatment ?Continue other home medications as prior ? ?Home Health: None ? ?Equipment/Devices: None ? ?Discharge Condition:Stable ? ?CODE STATUS: Full ? ?Diet recommendation: Heart Healthy ? ?Brief/Interim Summary: ?Per HPI: ?Herbert Wood is a 87 year old male with significant past medical history of factor V Leyden deficiency, DVT (on Eliquis), CKD 3, HTN, HLD, peripheral neuropathy, DM 2, hearing loss, iron deficiency anemia ... Presented today with chief complaint of abdominal pain, nausea vomiting. ?  ?Per patient his symptoms started yesterday now he is developing fever per wife he had had a Tmax of 103, last night around 11 PM.  He had 2 episode of nonbloody emesis.  Had a normal bowel movement which was nonbloody.  Bowel movement did not relieve his pain, he continued have periumbilical region pain without radiation worsened with movement. ?Patient and his wife had had [place] food last night his wife did not get sick. ?He has been recently seen by GI for iron deficiency anemia no reported history of diverticulosis or diverticulitis. ?He has had plan with Dr. Laural Golden for endoscopy and colonoscopy in near future. ? ?-Patient was admitted with concern for colonic diverticular abscess.  He was started on IV fluid as well as IV ciprofloxacin and Flagyl with improvement in his leukocytosis and overall condition.  He is tolerating diet and has no further concerns of abdominal pain.  Abscess seems to be less of a concern at the moment and GI  plans to perform endoscopy outpatient to follow-up.  He has been recommended to remain on oral antibiotics as noted above.  He is currently in stable condition for discharge. ? ?Discharge Diagnoses:  ?Principal Problem: ?  Colonic diverticular abscess ?Active Problems: ?  Leukocytosis ?  DM (diabetes mellitus), type 2 ([facility]) ?  HTN (hypertension) ?  HLD (hyperlipidemia) ?  Hard of hearing ?  Factor V Leiden ([place]) ?  Chronic kidney disease (CKD), stage III (moderate) (H[facility]) ?  Iron deficiency anemia ?  History of DVT (deep vein thrombosis) ?  Peripheral neuropathy ?  NSAID long-term use ? ?Principal discharge diagnosis: Diverticulitis with concern for abscess. ? ?Discharge Instructions ? ?Discharge Instructions   ? ? Diet - low sodium heart healthy   Complete by: As directed ?  ? Increase activity slowly   Complete by: As directed ?  ? ?  ? ?Allergies as of 11/04/2021   ? ?   Reactions  ? Gadobenate Nausea And Vomiting  ? Immediately upon the infusion of 68m multihance contrast  Patient had exteme nausea and vomiting.   No other symptoms noted .  No injury.  MRI scan was completed after patient felt better.   ?Immediately upon the infusion of 15mmultihance contrast  Patient had exteme nausea and vomiting.   No other symptoms noted .  No injury.  MRI scan was completed after patient felt better.    ? ?  ? ?  ?Medication List  ?  ? ?TAKE these medications   ? ?albuterol 108 (90 Base) MCG/ACT inhaler ?Commonly known as: VENTOLIN HFA ?Inhale into the lungs. As needed ?  ?  amitriptyline 10 MG tablet ?Commonly known as: ELAVIL ?Take 10 mg by mouth at bedtime. ?  ?amLODipine 5 MG tablet ?Commonly known as: NORVASC ?Take 5 mg by mouth 2 (two) times daily. ?  ?apixaban 5 MG Tabs tablet ?Commonly known as: ELIQUIS ?Take 5 mg by mouth 2 (two) times daily. ?  ?atorvastatin 40 MG tablet ?Commonly known as: LIPITOR ?Take 40 mg by mouth daily. ?  ?carvedilol 6.25 MG tablet ?Commonly known as: COREG ?Take 6.25 mg by mouth 2 (two)  times daily with a meal. ?  ?ciprofloxacin 500 MG tablet ?Commonly known as: Cipro ?Take 1 tablet (500 mg total) by mouth 2 (two) times daily for 10 days. ?  ?dorzolamide 2 % ophthalmic solution ?Commonly known as: TRUSOPT ?Place 1 drop into the left eye 2 (two) times daily. One drop bid ?  ?furosemide 20 MG tablet ?Commonly known as: LASIX ?Take 20 mg by mouth daily as needed for fluid. One daily as needed ?  ?hydrALAZINE 25 MG tablet ?Commonly known as: APRESOLINE ?Take 50 mg by mouth 3 (three) times daily. ?  ?irbesartan 300 MG tablet ?Commonly known as: AVAPRO ?Take 300 mg by mouth daily. ?  ?latanoprost 0.005 % ophthalmic solution ?Commonly known as: XALATAN ?Place 1 drop into both eyes at bedtime. ?  ?linagliptin 5 MG Tabs tablet ?Commonly known as: TRADJENTA ?Take 5 mg by mouth daily. ?  ?LORazepam 0.5 MG tablet ?Commonly known as: ATIVAN ?Take 0.5 mg by mouth 2 (two) times daily as needed for anxiety. ?  ?metroNIDAZOLE 500 MG tablet ?Commonly known as: Flagyl ?Take 1 tablet (500 mg total) by mouth 3 (three) times daily for 10 days. ?  ?OVER THE COUNTER MEDICATION ?Nature's bounty iron 65 mg one bid ?  ?OVER THE COUNTER MEDICATION ?Vit C one daily ?Folic acid with J57 one daily 800 mcg ?Methyl B-12 1,000 mcg one daily ?Prostate support softgels one daily ?  ?PEPCID AC PO ?Take by mouth. One daily ?  ? ?  ? ? Follow-up Information   ? ? Pomposini, Cherly Anderson, MD. Schedule an appointment as soon as possible for a visit in 1 week(s).   ?Specialty: Internal Medicine ? ?  ?  ? ? [place]. Go to.   ?Contact information: ?[address] ?[place] ?[phone] ? ?  ?  ? ?  ?  ? ?  ? ?Allergies  ?Allergen Reactions  ? Gadobenate Nausea And Vomiting  ?  Immediately upon the infusion of 45m multihance contrast  Patient had exteme nausea and vomiting.   No other symptoms noted .  No injury.  MRI scan was completed after patient felt better.   ?Immediately upon the  infusion of 139mmultihance contrast  Patient had exteme nausea and vomiting.   No other symptoms noted .  No injury.  MRI scan was completed after patient felt better.   ?  ? ? ?Consultations: ?GI ?General surgery ? ? ?Procedures/Studies: ?CT ABDOMEN PELVIS W CONTRAST ? ?Result Date: 11/01/2021 ?CLINICAL DATA:  Fever abdominal pain and vomiting since last night. EXAM: CT ABDOMEN AND PELVIS WITH CONTRAST TECHNIQUE: Multidetector CT imaging of the abdomen and pelvis was performed using the standard protocol following bolus administration of intravenous contrast. RADIATION DOSE REDUCTION: This exam was performed according to the departmental dose-optimization program which includes automated exposure control, adjustment of the mA and/or kV according to patient size and/or use of iterative reconstruction technique. CONTRAST:  10053mMNIPAQUE IOHEXOL 300 MG/ML  SOLN COMPARISON:  None FINDINGS: Lower chest: The lung bases are clear of acute process. No pleural effusion or pulmonary lesions. The heart is normal in size. No pericardial effusion. Aortic and coronary artery calcifications are noted. The distal esophagus and aorta are unremarkable. Hepatobiliary: No hepatic lesions or intrahepatic biliary dilatation. The gallbladder is unremarkable. No common bile duct dilatation. Pancreas: No mass, inflammation ductal dilatation. Spleen: Normal size.  No focal lesions. Adrenals/Urinary Tract: The adrenal glands are normal. There are numerous bilateral renal cysts. No worrisome renal lesions. Small right renal calculi. No hydroureteronephrosis. The bladder is unremarkable. Stomach/Bowel: The stomach, duodenum, small bowel and terminal ileum are unremarkable. The appendix is normal. There is diffuse and severe colonic diverticulosis. Significant inflammatory type changes involving the splenic flexure region the colon. I suspect this is acute diverticulitis with a possible intramural abscess measuring 3.5 cm. Surrounding  inflammatory changes involving the pericolonic fat. I do not see an obvious mass lesion but would treat this patient for diverticulitis and recommend correlation with colon cancer screening history and possible fo

## 2021-11-06 LAB — CULTURE, BLOOD (ROUTINE X 2)
Culture: NO GROWTH
Culture: NO GROWTH

## 2021-11-07 NOTE — Telephone Encounter (Signed)
Ofc apt schd 12/05/21 at 330 w/ Dr Jenetta Downer - apt letter mailed to patient ?

## 2021-11-16 ENCOUNTER — Telehealth (INDEPENDENT_AMBULATORY_CARE_PROVIDER_SITE_OTHER): Payer: Self-pay | Admitting: *Deleted

## 2021-11-16 ENCOUNTER — Other Ambulatory Visit (INDEPENDENT_AMBULATORY_CARE_PROVIDER_SITE_OTHER): Payer: Self-pay | Admitting: *Deleted

## 2021-11-16 DIAGNOSIS — D509 Iron deficiency anemia, unspecified: Secondary | ICD-10-CM

## 2021-11-16 NOTE — Telephone Encounter (Signed)
Pt's wife called and states pt had labs with pcp on 4/17 and got results yesterday. Hemoglobin 8.6.  bw orders on your desk for review. Wife states he had iron infusion on the 5th and 6th. Pcp told him to start back taking iron tablets '325mg'$  bid and he did take one this morning but states he was told by Dr. Laural Golden in the past he could not absorb iron tablets. He is weak, head feels hallow ( pt states from the meds he is taking), dizzy epidsodes when standing, sob with activity. States symptoms have been this way and not getting worse. Has follow up appt on 5/8 and was told he needed colonoscopy. Asking if he should have colonoscopy before visit or if that will be set up at his visit and should he take iron tablets or does he need to get infusion.  ? ?[phone] - wife Caryl Asp ( on dpr)  ?

## 2021-11-16 NOTE — Telephone Encounter (Signed)
I called and discussed with pt's wife per Penni Bombard, I did review labs, hgb 8.6, up from 8 earlier this month during admission, he will need both EGD and Colonoscopy but this will need to be done around beginning of June as we have to wait atleast 8 weeks from time of acute diverticulitis before proceeding with colonoscopy so we will see him for follow up but can go ahead and get him on the schedule for EGD/Colon in early June. I agree he should take Iron BID, as I discussed with him during last visit. We will recheck hgb again in about 10 days to ensure it has not dropped anymore, if he becomes more sob, dizzy, more fatigued or has any episodes of passing out, these are signs he needs to go to the ER as blood counts may have dropped further.   ? ?She verbalized understanding of all and asked for me to mail lab order for her to do at [facility] in [place]. I placed order in mail for [facility].  ?  ?   ? ?

## 2021-11-25 LAB — HEMOGLOBIN AND HEMATOCRIT, BLOOD
Hematocrit: 26.4 % — ABNORMAL LOW (ref 37.5–51.0)
Hemoglobin: 9 g/dL — ABNORMAL LOW (ref 13.0–17.7)

## 2021-12-03 ENCOUNTER — Encounter (HOSPITAL_COMMUNITY): Payer: Self-pay

## 2021-12-03 ENCOUNTER — Other Ambulatory Visit: Payer: Self-pay

## 2021-12-03 ENCOUNTER — Emergency Department (HOSPITAL_COMMUNITY)
Admission: EM | Admit: 2021-12-03 | Discharge: 2021-12-03 | Disposition: A | Discharge status: Home / Self Care | Payer: Medicare Other | Attending: Emergency Medicine | Admitting: Emergency Medicine

## 2021-12-03 DIAGNOSIS — E119 Type 2 diabetes mellitus without complications: Secondary | ICD-10-CM | POA: Diagnosis not present

## 2021-12-03 DIAGNOSIS — Z79899 Other long term (current) drug therapy: Secondary | ICD-10-CM | POA: Insufficient documentation

## 2021-12-03 DIAGNOSIS — Z7901 Long term (current) use of anticoagulants: Secondary | ICD-10-CM | POA: Diagnosis not present

## 2021-12-03 DIAGNOSIS — R42 Dizziness and giddiness: Secondary | ICD-10-CM | POA: Insufficient documentation

## 2021-12-03 DIAGNOSIS — I10 Essential (primary) hypertension: Secondary | ICD-10-CM | POA: Diagnosis not present

## 2021-12-03 LAB — CBC WITH DIFFERENTIAL/PLATELET
Abs Immature Granulocytes: 0.04 10*3/uL (ref 0.00–0.07)
Basophils Absolute: 0 10*3/uL (ref 0.0–0.1)
Basophils Relative: 0 %
Eosinophils Absolute: 0.2 10*3/uL (ref 0.0–0.5)
Eosinophils Relative: 2 %
HCT: 27.1 % — ABNORMAL LOW (ref 39.0–52.0)
Hemoglobin: 9 g/dL — ABNORMAL LOW (ref 13.0–17.0)
Immature Granulocytes: 0 %
Lymphocytes Relative: 8 %
Lymphs Abs: 0.7 10*3/uL (ref 0.7–4.0)
MCH: 33.1 pg (ref 26.0–34.0)
MCHC: 33.2 g/dL (ref 30.0–36.0)
MCV: 99.6 fL (ref 80.0–100.0)
Monocytes Absolute: 1 10*3/uL (ref 0.1–1.0)
Monocytes Relative: 10 %
Neutro Abs: 7.5 10*3/uL (ref 1.7–7.7)
Neutrophils Relative %: 80 %
Platelets: 170 10*3/uL (ref 150–400)
RBC: 2.72 MIL/uL — ABNORMAL LOW (ref 4.22–5.81)
RDW: 15.8 % — ABNORMAL HIGH (ref 11.5–15.5)
WBC: 9.5 10*3/uL (ref 4.0–10.5)
nRBC: 0 % (ref 0.0–0.2)

## 2021-12-03 LAB — BASIC METABOLIC PANEL
Anion gap: 6 (ref 5–15)
BUN: 46 mg/dL — ABNORMAL HIGH (ref 8–23)
CO2: 22 mmol/L (ref 22–32)
Calcium: 8.6 mg/dL — ABNORMAL LOW (ref 8.9–10.3)
Chloride: 107 mmol/L (ref 98–111)
Creatinine, Ser: 1.74 mg/dL — ABNORMAL HIGH (ref 0.61–1.24)
GFR, Estimated: 38 mL/min — ABNORMAL LOW (ref >60)
Glucose, Bld: 136 mg/dL — ABNORMAL HIGH (ref 70–99)
Potassium: 4.4 mmol/L (ref 3.5–5.1)
Sodium: 135 mmol/L (ref 135–145)

## 2021-12-03 MED ORDER — SODIUM CHLORIDE 0.9 % IV BOLUS
500.0000 mL | Freq: Once | INTRAVENOUS | Status: AC
Start: 2021-12-03 — End: 2021-12-03
  Administered 2021-12-03: 500 mL via INTRAVENOUS

## 2021-12-03 NOTE — ED Triage Notes (Signed)
Pt c/o hypotension, states he has felt weak/dizzy for the last few days. States his bp was 103/36 at home. BP is 160/56 in triage.  ?

## 2021-12-03 NOTE — ED Provider Notes (Signed)
?[facility] ?Provider Note ? ? ?CSN: [id number] ?Arrival date & time: 12/03/21  1941 ? ?  ? ?History ? ?Chief Complaint  ?Patient presents with  ? Hypertension  ? ? ?Herbert Wood is a 87 y.o. male. ? ?Patient has a history of hypertension and diabetes.  He has been feeling dizzy and his blood pressures been low.  Patient has a history of hypertension and was instructed to stop taking his beta-blocker but he still taking ? ?The history is provided by the patient and medical records. No language interpreter was used.  ?Hypertension ?This is a recurrent problem. The current episode started more than 2 days ago. The problem occurs constantly. The problem has not changed since onset.Associated symptoms include chest pain. Pertinent negatives include no abdominal pain and no headaches. Nothing aggravates the symptoms. Nothing relieves the symptoms. He has tried nothing for the symptoms. The treatment provided no relief.  ? ?  ? ?Home Medications ?Prior to Admission medications   ?Medication Sig Start Date End Date Taking? Authorizing Provider  ?albuterol (VENTOLIN HFA) 108 (90 Base) MCG/ACT inhaler Inhale into the lungs. As needed   Yes [provider]  ?amitriptyline (ELAVIL) 10 MG tablet Take 10 mg by mouth at bedtime.   Yes [provider]  ?amLODipine (NORVASC) 5 MG tablet Take 5 mg by mouth 2 (two) times daily.   Yes [provider]  ?apixaban (ELIQUIS) 5 MG TABS tablet Take 5 mg by mouth 2 (two) times daily.   Yes [provider]  ?atorvastatin (LIPITOR) 40 MG tablet Take 40 mg by mouth daily.   Yes [provider]  ?carvedilol (COREG) 6.25 MG tablet Take 6.25 mg by mouth 2 (two) times daily with a meal.   Yes [provider]  ?dorzolamide (TRUSOPT) 2 % ophthalmic solution Place 1 drop into the left eye 2 (two) times daily. One drop bid   Yes [provider]  ?Famotidine (PEPCID AC PO) Take by mouth. One daily   Yes [provider]  ?furosemide (LASIX) 20 MG tablet Take 20 mg by mouth daily as needed for fluid. One daily as needed   Yes [provider]  ?hydrALAZINE (APRESOLINE) 25 MG tablet Take 50 mg by mouth 3 (three) times daily.   Yes [provider]  ?irbesartan (AVAPRO) 300 MG tablet Take 300 mg by mouth daily. 10/03/21  Yes [provider]  ?latanoprost (XALATAN) 0.005 % ophthalmic solution Place 1 drop into both eyes at bedtime.   Yes [provider]  ?linagliptin (TRADJENTA) 5 MG TABS tablet Take 5 mg by mouth daily.   Yes [provider]  ?LORazepam (ATIVAN) 0.5 MG tablet Take 0.5 mg by mouth 2 (two) times daily as needed for anxiety.   Yes [provider]  ?OVER THE COUNTER MEDICATION Nature's bounty iron 65 mg one bid   Yes [provider]  ?OVER THE COUNTER MEDICATION Vit C one daily ?Folic acid with W10 one daily 800 mcg ?Methyl B-12 1,000 mcg one daily ?Prostate support softgels one daily   Yes [provider]  ?   ? ?Allergies    ?Gadobenate   ? ?Review of Systems   ?Review of Systems  ?Constitutional:  Negative for appetite change and fatigue.  ?HENT:  Negative for congestion, ear discharge and sinus pressure.   ?Eyes:  Negative for discharge.  ?Respiratory:  Negative for cough.   ?Cardiovascular:  Positive for chest pain.  ?Gastrointestinal:  Negative for abdominal pain  and diarrhea.  ?Genitourinary:  Negative for frequency and hematuria.  ?Musculoskeletal:  Negative for back pain.  ?Skin:  Negative for rash.  ?Neurological:  Positive for dizziness. Negative for seizures and headaches.  ?Psychiatric/Behavioral:  Negative for hallucinations.   ? ?Physical Exam ?Updated Vital Signs ?BP (!) 148/59   Pulse (!) 57   Temp 97.7 ?F (36.5 ?C) (Oral)   Resp 18   Ht '5\' 10"'$  (1.778 m)   Wt 99.8 kg   SpO2 98%   BMI 31.57 kg/m?  ?Physical Exam ?Vitals and nursing note reviewed.  ?Constitutional:   ?   Appearance: He is well-developed.  ?HENT:  ?    Head: Normocephalic.  ?   Nose: Nose normal.  ?Eyes:  ?   General: No scleral icterus. ?   Conjunctiva/sclera: Conjunctivae normal.  ?Neck:  ?   Thyroid: No thyromegaly.  ?Cardiovascular:  ?   Rate and Rhythm: Normal rate and regular rhythm.  ?   Heart sounds: No murmur heard. ?  No friction rub. No gallop.  ?Pulmonary:  ?   Breath sounds: No stridor. No wheezing or rales.  ?Chest:  ?   Chest wall: No tenderness.  ?Abdominal:  ?   General: There is no distension.  ?   Tenderness: There is no abdominal tenderness. There is no rebound.  ?Musculoskeletal:     ?   General: Normal range of motion.  ?   Cervical back: Neck supple.  ?Lymphadenopathy:  ?   Cervical: No cervical adenopathy.  ?Skin: ?   Findings: No erythema or rash.  ?Neurological:  ?   Mental Status: He is alert and oriented to person, place, and time.  ?   Motor: No abnormal muscle tone.  ?   Coordination: Coordination normal.  ?Psychiatric:     ?   Behavior: Behavior normal.  ? ? ?ED Results / Procedures / Treatments   ?Labs ?(all labs ordered are listed, but only abnormal results are displayed) ?Labs Reviewed  ?CBC WITH DIFFERENTIAL/PLATELET - Abnormal; Notable for the following components:  ?    Result Value  ? RBC 2.72 (*)   ? Hemoglobin 9.0 (*)   ? HCT 27.1 (*)   ? RDW 15.8 (*)   ? All other components within normal limits  ?BASIC METABOLIC PANEL - Abnormal; Notable for the following components:  ? Glucose, Bld 136 (*)   ? BUN 46 (*)   ? Creatinine, Ser 1.74 (*)   ? Calcium 8.6 (*)   ? GFR, Estimated 38 (*)   ? All other components within normal limits  ? ? ?EKG ?None ? ?Radiology ?No results found. ? ?Procedures ?Procedures  ? ? ?Medications Ordered in ED ?Medications  ?sodium chloride 0.9 % bolus 500 mL (0 mLs Intravenous Stopped 12/03/21 2109)  ? ? ?ED Course/ Medical Decision Making/ A&P ?  ?                        ?Medical Decision Making ?Amount and/or Complexity of Data Reviewed ?Labs: ordered. ?ECG/medicine tests: ordered. ? ?This patient  presents to the ED for concern of dizziness, this involves an extensive number of treatment options, and is a complaint that carries with it a high risk of complications and morbidity.  The differential diagnosis includes hypotension, viral syndrome ? ? ?Co morbidities that complicate the patient evaluation ? ?Hypertension ? ? ?Additional history obtained: ? ?Additional history obtained from wife ?External records from outside source obtained and reviewed  including hospital record ? ? ?Lab Tests: ? ?I Ordered, and personally interpreted labs.  The pertinent results include: CBC shows stable anemia at 9, chemistry show elevated creatinine at 1.7 ? ? ?Imaging Studies ordered: ? ?No imaging ? ?Cardiac Monitoring: / EKG: ? ?The patient was maintained on a cardiac monitor.  I personally viewed and interpreted the cardiac monitored which showed an underlying rhythm of: Normal sinus rhythm ? ? ?Consultations Obtained: ? ?No consult ? ?Problem List / ED Course / Critical interventions / Medication management ? ?Hypertension ?I ordered medication including normal saline for dehydration ?Reevaluation of the patient after these medicines showed that the patient improved ?I have reviewed the patients home medicines and have made adjustments as needed ? ? ?Social Determinants of Health: ? ?None ? ? ?Test / Admission - Considered: ? ?Chest x-ray ? ?Patient with dizziness and some hypotension that resolved with fluids.  Labs unremarkable except for chronic anemia.  He will follow-up with his PCP and stop taking his beta-blocker as indicated before ? ? ? ? ? ? ? ?Final Clinical Impression(s) / ED Diagnoses ?Final diagnoses:  ?Dizziness  ? ? ?Rx / DC Orders ?ED Discharge Orders   ? ? None  ? ?  ? ? ?  ?Milton Ferguson, MD ?12/04/21 1123 ? ?

## 2021-12-03 NOTE — Discharge Instructions (Signed)
Drink plenty of fluids.  Check your blood pressure once a day.  Follow-up with your family doctor this week ?

## 2021-12-05 ENCOUNTER — Encounter (INDEPENDENT_AMBULATORY_CARE_PROVIDER_SITE_OTHER): Payer: Self-pay | Admitting: Gastroenterology

## 2021-12-05 ENCOUNTER — Ambulatory Visit (INDEPENDENT_AMBULATORY_CARE_PROVIDER_SITE_OTHER): Payer: Medicare Other | Admitting: Gastroenterology

## 2021-12-05 VITALS — BP 147/71 | HR 54 | Temp 97.7°F | Ht 70.0 in | Wt 205.6 lb

## 2021-12-05 DIAGNOSIS — D509 Iron deficiency anemia, unspecified: Secondary | ICD-10-CM

## 2021-12-05 DIAGNOSIS — K5792 Diverticulitis of intestine, part unspecified, without perforation or abscess without bleeding: Secondary | ICD-10-CM | POA: Diagnosis not present

## 2021-12-05 NOTE — Progress Notes (Signed)
Maylon Peppers, M.D. ?Gastroenterology & Hepatology ?[facility] ?[address] ?[place], [place] [address] ? ?Primary Care Physician: ?Pomposini, Cherly Anderson, MD ?No address on file ? ?I will communicate my assessment and recommendations to the referring MD via EMR. ? ?Problems: ?Diverticulitis c/b intramural abscess ?Iron deficiency anemia ? ?History of Present Illness: ?Gautam Langhorst is a 87 y.o. 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 ? ?The patient was last seen on 10/31/2021. At that time, the patient was being considered for esophagogastroduodenospy and colonoscopy for evaluation of iron deficiency anemia.  However, the day after his visit he developed abdominal pain was admitted to the hospital after presenting possible diverticulitis with intramural abscess for which she was given ciprofloxacin and Flagyl with improvement of his symptoms.  Gastroenterology was consulted and the recommendation was to defer endoscopic evaluations at least 2 months after his initial presentation. ? ?Patient underwent antibiotic management for diverticulitis with intramural abscess. He was discharged home on ciprofloxacin and metronidazole.  He finished his antibiotics and reports feeling better afterwards. ? ?Patient reports that he had some change sin his BP medication recently and since then he has presented intermittent episodes of sudden nausea and vomiting, along with lightheadedness. States he has some lightheadedness when he stands up. He actually had an episode of mildly decreased SBP in the low 100s for which he was brought to [facility] on 12/03/2021. He had an  episode of vomiting clear contents.  ? ?The patient denies having any nausea, vomiting, fever, chills, hematochezia, melena, hematemesis, abdominal distention, abdominal pain, diarrhea, jaundice, pruritus. Has lost 20-30 lb in last year without changes in his diet. He has tried low fiber diet as much as possible. His shortness of breath has improved. ? ?He has been taking oral iron twice a day. This has turned his stools to a darker color. ? ?Does not take NSAIDs. ? ?Last Colonoscopy:08/20/17- Dr. Donnajean Lopes, had polyps per patient ?Last Endoscopy:never  ? ?Past Medical History: ?Past Medical History:  ?Diagnosis Date  ? Adenomatous colon polyp   ? Carotid bruit   ? Chronic dyspnea   ? Diabetes ([place])   ? type 2  ? Diverticular disease   ? DVT (deep venous thrombosis) ([place])   ? lower limb  ? Factor V deficiency ([place])   ? 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 Hyperlipemia   ? Hypertension   ? Inguinal hernia   ? Kidney disease   ? stage 3  ? Lung nodule   ? Lymphadenopathy   ? 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 Murmur   ? Osteoarthritis   ? Senile purpura ([place])   ? ? ?Past Surgical History:History reviewed. No pertinent surgical history. ? ?Family History:History reviewed. No pertinent family history. ? ?Social History: ?Social History  ? ?Tobacco Use  ?Smoking Status Former  ? Types: Cigarettes  ? Passive exposure: Past  ?Smokeless Tobacco Never  ? ?Social History  ? ?Substance and Sexual Activity  ?Alcohol Use Never  ? ?Social History  ? ?Substance and Sexual Activity  ?Drug Use Never  ? ? ?Allergies: ?Allergies  ?Allergen Reactions  ? Gadobenate Nausea And Vomiting  ?  Immediately upon the infusion of 52m  multihance contrast  Patient had exteme nausea and vomiting.   No other symptoms noted .  No injury.  MRI scan was completed after patient felt better.   ?Immediately upon the infusion of 79m multihance contrast  Patient had exteme nausea and vomiting.   No other symptoms noted .  No injury.  MRI scan was  completed after patient felt better.   ?  ? ? ?Medications: ?Current Outpatient Medications  ?Medication Sig Dispense Refill  ? albuterol (VENTOLIN HFA) 108 (90 Base) MCG/ACT inhaler Inhale into the lungs. As needed    ? amitriptyline (ELAVIL) 10 MG tablet Take 10 mg by mouth at bedtime.    ? amLODipine (NORVASC) 5 MG tablet Take 5 mg by mouth 2 (two) times daily.    ? apixaban (ELIQUIS) 5 MG TABS tablet Take 5 mg by mouth 2 (two) times daily.    ? atorvastatin (LIPITOR) 40 MG tablet Take 40 mg by mouth daily.    ? carvedilol (COREG) 6.25 MG tablet Take 6.25 mg by mouth 2 (two) times daily with a meal.    ? dorzolamide (TRUSOPT) 2 % ophthalmic solution Place 1 drop into the left eye 2 (two) times daily. One drop bid    ? Famotidine (PEPCID AC PO) Take by mouth. One daily    ? furosemide (LASIX) 20 MG tablet Take 20 mg by mouth daily as needed for fluid. One daily as needed    ? hydrALAZINE (APRESOLINE) 25 MG tablet Take 50 mg by mouth 3 (three) times daily.    ? irbesartan (AVAPRO) 300 MG tablet Take 300 mg by mouth daily.    ? latanoprost (XALATAN) 0.005 % ophthalmic solution Place 1 drop into both eyes at bedtime.    ? linagliptin (TRADJENTA) 5 MG TABS tablet Take 5 mg by mouth daily.    ? LORazepam (ATIVAN) 0.5 MG tablet Take 0.5 mg by mouth 2 (two) times daily as needed for anxiety.    ? OVER THE COUNTER MEDICATION Nature's bounty iron 65 mg one bid    ? OVER THE COUNTER MEDICATION Vit C one daily ?Folic acid with BW25one daily 800 mcg ?Methyl B-12 1,000 mcg one daily ?Prostate support softgels one daily    ? ?No current facility-administered medications for this visit.  ? ? ?Review of Systems: ?GENERAL: negative for malaise, night sweats ?HEENT: No changes in hearing or vision, no nose bleeds or other nasal problems. ?NECK: Negative for lumps, goiter, pain and significant neck swelling ?RESPIRATORY: Negative for cough, wheezing ?CARDIOVASCULAR: Negative for chest pain, leg swelling, palpitations, orthopnea ?GI:  SEE HPI ?MUSCULOSKELETAL: Negative for joint pain or swelling, back pain, and muscle pain. ?SKIN: Negative for lesions, rash ?PSYCH: Negative for sleep disturbance, mood disorder and recent psychosocial stressors. ?HEMATOLOGY Negative for prolonged bleeding, bruising easily, and swollen nodes. ?ENDOCRINE: Negative for cold or heat intolerance, polyuria, polydipsia and goiter. ?NEURO: negative for tremor, gait imbalance, syncope and seizures. ?The remainder of the review of systems is noncontributory. ? ? ?Physical Exam: ?BP (!) 147/71 (BP Location: Left Arm, Patient Position: Sitting, Cuff Size: Large)   Pulse (!) 54   Temp 97.7 ?F (36.5 ?C) (Oral)   Ht '5\' 10"'$  (1.778 m)   Wt 205 lb 9.6 oz (93.3 kg)   BMI 29.50 kg/m?  ?GENERAL: The patient is AO x3, in no acute distress. ?HEENT: Head is normocephalic and atraumatic. EOMI are intact. Mouth is well hydrated and without lesions. ?NECK: Supple. No masses ?LUNGS: Clear to auscultation. No presence  of rhonchi/wheezing/rales. Adequate chest expansion ?HEART: RRR, normal s1 and s2. ?ABDOMEN: Soft, nontender, no guarding, no peritoneal signs, and nondistended. BS +. No masses. ?EXTREMITIES: Without any cyanosis, clubbing, rash, lesions or edema. ?NEUROLOGIC: AOx3, no focal motor deficit. ?SKIN: no jaundice, no rashes ? ?Imaging/Labs: ?as above ? ?I personally reviewed and interpreted the available labs, imaging and endoscopic files. ? ?Impression and Plan: ?Jaishon Krisher is a 87 y.o. 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  Patient had presence of iron deficiency anemia in the past for which he was considering whether to undergo endoscopic evaluation or  manage expectantly his lab abnormalitis.  However he had an episode of diverticulitis complicated by a diverticular abscess for which he was managed with antibiotics successfully.  We discussed with the patient and his

## 2021-12-05 NOTE — Patient Instructions (Signed)
Schedule EGD and colonoscopy first week of June ?Continue oral iron twice a day ?Check BP both laying down and sitting up, discuss these values with PCP ?

## 2021-12-09 ENCOUNTER — Other Ambulatory Visit (INDEPENDENT_AMBULATORY_CARE_PROVIDER_SITE_OTHER): Payer: Self-pay

## 2021-12-09 ENCOUNTER — Telehealth (INDEPENDENT_AMBULATORY_CARE_PROVIDER_SITE_OTHER): Payer: Self-pay

## 2021-12-09 ENCOUNTER — Encounter (INDEPENDENT_AMBULATORY_CARE_PROVIDER_SITE_OTHER): Payer: Self-pay

## 2021-12-09 DIAGNOSIS — D509 Iron deficiency anemia, unspecified: Secondary | ICD-10-CM

## 2021-12-09 DIAGNOSIS — K5792 Diverticulitis of intestine, part unspecified, without perforation or abscess without bleeding: Secondary | ICD-10-CM

## 2021-12-09 MED ORDER — PEG 3350-KCL-NA BICARB-NACL 420 G PO SOLR: 4000.0000 mL | ORAL | 0 refills | Status: DC

## 2021-12-09 NOTE — Telephone Encounter (Signed)
Delancey Moraes Ann Verity Gilcrest, CMA  ?

## 2021-12-12 ENCOUNTER — Encounter (INDEPENDENT_AMBULATORY_CARE_PROVIDER_SITE_OTHER): Payer: Self-pay

## 2021-12-15 ENCOUNTER — Telehealth (INDEPENDENT_AMBULATORY_CARE_PROVIDER_SITE_OTHER): Payer: Self-pay

## 2021-12-15 NOTE — Telephone Encounter (Signed)
Per Dr Jodelle Green Mr Herbert Wood dob 06-05-2035 may hold his Eliquis 48 hours prior to procedure and resume 12 to 24 hours after his procedure.

## 2022-01-04 NOTE — Patient Instructions (Signed)
Herbert [facility]  01/04/2022     '@PREFPERIOPPHARMACY'$ @   Your procedure is scheduled on  01/10/2022.   Report to [facility] at  1300 (1:00) P.M.   Call this number if you have problems the morning of surgery:  [phone]   Remember:  Follow the diet and prep instructions given to you by the office.    Your last dose of eliquis should be 01/07/2022.     DO NOT take any medications for diabetes the morning of your procedure.    Use your inhaler before you come and bring your rescue inhaler with you.     Take these medicines the morning of surgery with A SIP OF WATER      amlodipine, carvedilol, pepcid, ativan (If needed).     Do not wear jewelry, make-up or nail polish.  Do not wear lotions, powders, or perfumes, or deodorant.  Do not shave 48 hours prior to surgery.  Men may shave face and neck.  Do not bring valuables to the hospital.  [facility] is not responsible for any belongings or valuables.  Contacts, dentures or bridgework may not be worn into surgery.  Leave your suitcase in the car.  After surgery it may be brought to your room.  For patients admitted to the hospital, discharge time will be determined by your treatment team.  Patients discharged the day of surgery will not be allowed to drive home and must have someone with them for 24 hours.    Special instructions:   DO NOT smoke tobacco or vape for 24 hours before your procedure.  Please read over the following fact sheets that you were given. Anesthesia Post-op Instructions and Care and Recovery After Surgery      Upper Endoscopy, Adult, Care After This sheet gives you information about how to care for yourself after your procedure. Your health care provider may also give you more specific instructions. If you have problems or questions, contact your health care provider. What can I expect after the procedure? After the procedure, it is common to have: A sore throat. Mild stomach pain or  discomfort. Bloating. Nausea. Follow these instructions at home:  Follow instructions from your health care provider about what to eat or drink after your procedure. Return to your normal activities as told by your health care provider. Ask your health care provider what activities are safe for you. Take over-the-counter and prescription medicines only as told by your health care provider. If you were given a sedative during the procedure, it can affect you for several hours. Do not drive or operate machinery until your health care provider says that it is safe. Keep all follow-up visits as told by your health care provider. This is important. Contact a health care provider if you have: A sore throat that lasts longer than one day. Trouble swallowing. Get help right away if: You vomit blood or your vomit looks like coffee grounds. You have: A fever. Bloody, black, or tarry stools. A severe sore throat or you cannot swallow. Difficulty breathing. Severe pain in your chest or abdomen. Summary After the procedure, it is common to have a sore throat, mild stomach discomfort, bloating, and nausea. If you were given a sedative during the procedure, it can affect you for several hours. Do not drive or operate machinery until your health care provider says that it is safe. Follow instructions from your health care provider about what to eat or drink  after your procedure. Return to your normal activities as told by your health care provider. This information is not intended to replace advice given to you by your health care provider. Make sure you discuss any questions you have with your health care provider. Document Revised: 05/23/2019 Document Reviewed: 12/17/2017 Elsevier Patient Education  [facility]. Colonoscopy, Adult, Care After The following information offers guidance on how to care for yourself after your procedure. Your health care provider may also give you more specific  instructions. If you have problems or questions, contact your health care provider. What can I expect after the procedure? After the procedure, it is common to have: A small amount of blood in your stool for 24 hours after the procedure. Some gas. Mild cramping or bloating of your abdomen. Follow these instructions at home: Eating and drinking  Drink enough fluid to keep your urine pale yellow. Follow instructions from your health care provider about eating or drinking restrictions. Resume your normal diet as told by your health care provider. Avoid heavy or fried foods that are hard to digest. Activity Rest as told by your health care provider. Avoid sitting for a long time without moving. Get up to take short walks every 1-2 hours. This is important to improve blood flow and breathing. Ask for help if you feel weak or unsteady. Return to your normal activities as told by your health care provider. Ask your health care provider what activities are safe for you. Managing cramping and bloating  Try walking around when you have cramps or feel bloated. If directed, apply heat to your abdomen as told by your health care provider. Use the heat source that your health care provider recommends, such as a moist heat pack or a heating pad. Place a towel between your skin and the heat source. Leave the heat on for 20-30 minutes. Remove the heat if your skin turns bright red. This is especially important if you are unable to feel pain, heat, or cold. You have a greater risk of getting burned. General instructions If you were given a sedative during the procedure, it can affect you for several hours. Do not drive or operate machinery until your health care provider says that it is safe. For the first 24 hours after the procedure: Do not sign important documents. Do not drink alcohol. Do your regular daily activities at a slower pace than normal. Eat soft foods that are easy to digest. Take  over-the-counter and prescription medicines only as told by your health care provider. Keep all follow-up visits. This is important. Contact a health care provider if: You have blood in your stool 2-3 days after the procedure. Get help right away if: You have more than a small spotting of blood in your stool. You have large blood clots in your stool. You have swelling of your abdomen. You have nausea or vomiting. You have a fever. You have increasing pain in your abdomen that is not relieved with medicine. These symptoms may be an emergency. Get help right away. Call 911. Do not wait to see if the symptoms will go away. Do not drive yourself to the hospital. Summary After the procedure, it is common to have a small amount of blood in your stool. You may also have mild cramping and bloating of your abdomen. If you were given a sedative during the procedure, it can affect you for several hours. Do not drive or operate machinery until your health care provider says that it  is safe. Get help right away if you have a lot of blood in your stool, nausea or vomiting, a fever, or increased pain in your abdomen. This information is not intended to replace advice given to you by your health care provider. Make sure you discuss any questions you have with your health care provider. Document Revised: 03/09/2021 Document Reviewed: 03/09/2021 Elsevier Patient Education  [place] This sheet gives you information about how to care for yourself after your procedure. Your health care provider may also give you more specific instructions. If you have problems or questions, contact your health care provider. What can I expect after the procedure? After the procedure, it is common to have: Tiredness. Forgetfulness about what happened after the procedure. Impaired judgment for important decisions. Nausea or vomiting. Some difficulty with balance. Follow these  instructions at home: For the time period you were told by your health care provider:     Rest as needed. Do not participate in activities where you could fall or become injured. Do not drive or use machinery. Do not drink alcohol. Do not take sleeping pills or medicines that cause drowsiness. Do not make important decisions or sign legal documents. Do not take care of children on your own. Eating and drinking Follow the diet that is recommended by your health care provider. Drink enough fluid to keep your urine pale yellow. If you vomit: Drink water, juice, or soup when you can drink without vomiting. Make sure you have little or no nausea before eating solid foods. General instructions Have a responsible adult stay with you for the time you are told. It is important to have someone help care for you until you are awake and alert. Take over-the-counter and prescription medicines only as told by your health care provider. If you have sleep apnea, surgery and certain medicines can increase your risk for breathing problems. Follow instructions from your health care provider about wearing your sleep device: Anytime you are sleeping, including during daytime naps. While taking prescription pain medicines, sleeping medicines, or medicines that make you drowsy. Avoid smoking. Keep all follow-up visits as told by your health care provider. This is important. Contact a health care provider if: You keep feeling nauseous or you keep vomiting. You feel light-headed. You are still sleepy or having trouble with balance after 24 hours. You develop a rash. You have a fever. You have redness or swelling around the IV site. Get help right away if: You have trouble breathing. You have new-onset confusion at home. Summary For several hours after your procedure, you may feel tired. You may also be forgetful and have poor judgment. Have a responsible adult stay with you for the time you are told. It  is important to have someone help care for you until you are awake and alert. Rest as told. Do not drive or operate machinery. Do not drink alcohol or take sleeping pills. Get help right away if you have trouble breathing, or if you suddenly become confused. This information is not intended to replace advice given to you by your health care provider. Make sure you discuss any questions you have with your health care provider. Document Revised: 06/21/2021 Document Reviewed: 06/19/2019 Elsevier Patient Education  [place].

## 2022-01-05 ENCOUNTER — Encounter (HOSPITAL_COMMUNITY): Payer: Self-pay

## 2022-01-05 ENCOUNTER — Encounter (HOSPITAL_COMMUNITY)
Admission: RE | Admit: 2022-01-05 | Discharge: 2022-01-05 | Disposition: A | Discharge status: Home / Self Care | Payer: Medicare Other | Source: Ambulatory Visit | Attending: Gastroenterology | Admitting: Gastroenterology

## 2022-01-05 VITALS — BP 130/47 | HR 44 | Temp 97.8°F | Resp 18 | Ht 70.0 in | Wt 206.0 lb

## 2022-01-05 DIAGNOSIS — K5792 Diverticulitis of intestine, part unspecified, without perforation or abscess without bleeding: Secondary | ICD-10-CM | POA: Insufficient documentation

## 2022-01-05 DIAGNOSIS — D509 Iron deficiency anemia, unspecified: Secondary | ICD-10-CM | POA: Insufficient documentation

## 2022-01-05 DIAGNOSIS — Z01812 Encounter for preprocedural laboratory examination: Secondary | ICD-10-CM | POA: Insufficient documentation

## 2022-01-05 DIAGNOSIS — E119 Type 2 diabetes mellitus without complications: Secondary | ICD-10-CM

## 2022-01-05 DIAGNOSIS — N183 Chronic kidney disease, stage 3 unspecified: Secondary | ICD-10-CM

## 2022-01-05 LAB — CBC WITH DIFFERENTIAL/PLATELET
Abs Immature Granulocytes: 0.02 10*3/uL (ref 0.00–0.07)
Basophils Absolute: 0 10*3/uL (ref 0.0–0.1)
Basophils Relative: 1 %
Eosinophils Absolute: 0.3 10*3/uL (ref 0.0–0.5)
Eosinophils Relative: 4 %
HCT: 28.4 % — ABNORMAL LOW (ref 39.0–52.0)
Hemoglobin: 9.1 g/dL — ABNORMAL LOW (ref 13.0–17.0)
Immature Granulocytes: 0 %
Lymphocytes Relative: 15 %
Lymphs Abs: 1 10*3/uL (ref 0.7–4.0)
MCH: 32.9 pg (ref 26.0–34.0)
MCHC: 32 g/dL (ref 30.0–36.0)
MCV: 102.5 fL — ABNORMAL HIGH (ref 80.0–100.0)
Monocytes Absolute: 0.8 10*3/uL (ref 0.1–1.0)
Monocytes Relative: 12 %
Neutro Abs: 4.5 10*3/uL (ref 1.7–7.7)
Neutrophils Relative %: 68 %
Platelets: 213 10*3/uL (ref 150–400)
RBC: 2.77 MIL/uL — ABNORMAL LOW (ref 4.22–5.81)
RDW: 15.4 % (ref 11.5–15.5)
WBC: 6.6 10*3/uL (ref 4.0–10.5)
nRBC: 0 % (ref 0.0–0.2)

## 2022-01-05 LAB — BASIC METABOLIC PANEL
Anion gap: 7 (ref 5–15)
BUN: 36 mg/dL — ABNORMAL HIGH (ref 8–23)
CO2: 20 mmol/L — ABNORMAL LOW (ref 22–32)
Calcium: 8.5 mg/dL — ABNORMAL LOW (ref 8.9–10.3)
Chloride: 106 mmol/L (ref 98–111)
Creatinine, Ser: 1.4 mg/dL — ABNORMAL HIGH (ref 0.61–1.24)
GFR, Estimated: 49 mL/min — ABNORMAL LOW (ref >60)
Glucose, Bld: 135 mg/dL — ABNORMAL HIGH (ref 70–99)
Potassium: 4.4 mmol/L (ref 3.5–5.1)
Sodium: 133 mmol/L — ABNORMAL LOW (ref 135–145)

## 2022-01-10 ENCOUNTER — Ambulatory Visit (HOSPITAL_COMMUNITY): Payer: Medicare Other | Admitting: Anesthesiology

## 2022-01-10 ENCOUNTER — Ambulatory Visit (HOSPITAL_BASED_OUTPATIENT_CLINIC_OR_DEPARTMENT_OTHER): Payer: Medicare Other | Admitting: Anesthesiology

## 2022-01-10 ENCOUNTER — Encounter (HOSPITAL_COMMUNITY): Payer: Self-pay | Admitting: Gastroenterology

## 2022-01-10 ENCOUNTER — Encounter (HOSPITAL_COMMUNITY)
Admission: RE | Disposition: A | Discharge status: Home / Self Care | Payer: Self-pay | Source: Home / Self Care | Attending: Gastroenterology

## 2022-01-10 ENCOUNTER — Ambulatory Visit (HOSPITAL_COMMUNITY)
Admission: RE | Admit: 2022-01-10 | Discharge: 2022-01-10 | Disposition: A | Discharge status: Home / Self Care | Payer: Medicare Other | Attending: Gastroenterology | Admitting: Gastroenterology

## 2022-01-10 DIAGNOSIS — K3189 Other diseases of stomach and duodenum: Secondary | ICD-10-CM | POA: Diagnosis not present

## 2022-01-10 DIAGNOSIS — E1122 Type 2 diabetes mellitus with diabetic chronic kidney disease: Secondary | ICD-10-CM | POA: Diagnosis not present

## 2022-01-10 DIAGNOSIS — C184 Malignant neoplasm of transverse colon: Secondary | ICD-10-CM

## 2022-01-10 DIAGNOSIS — D509 Iron deficiency anemia, unspecified: Secondary | ICD-10-CM | POA: Diagnosis present

## 2022-01-10 DIAGNOSIS — N183 Chronic kidney disease, stage 3 unspecified: Secondary | ICD-10-CM | POA: Diagnosis not present

## 2022-01-10 DIAGNOSIS — K449 Diaphragmatic hernia without obstruction or gangrene: Secondary | ICD-10-CM | POA: Insufficient documentation

## 2022-01-10 DIAGNOSIS — C189 Malignant neoplasm of colon, unspecified: Secondary | ICD-10-CM | POA: Diagnosis not present

## 2022-01-10 DIAGNOSIS — Z86010 Personal history of colonic polyps: Secondary | ICD-10-CM | POA: Insufficient documentation

## 2022-01-10 DIAGNOSIS — Z7722 Contact with and (suspected) exposure to environmental tobacco smoke (acute) (chronic): Secondary | ICD-10-CM | POA: Diagnosis not present

## 2022-01-10 DIAGNOSIS — K5790 Diverticulosis of intestine, part unspecified, without perforation or abscess without bleeding: Secondary | ICD-10-CM

## 2022-01-10 DIAGNOSIS — Z85858 Personal history of malignant neoplasm of other endocrine glands: Secondary | ICD-10-CM | POA: Diagnosis not present

## 2022-01-10 DIAGNOSIS — E785 Hyperlipidemia, unspecified: Secondary | ICD-10-CM | POA: Diagnosis not present

## 2022-01-10 DIAGNOSIS — K31A19 Gastric intestinal metaplasia without dysplasia, unspecified site: Secondary | ICD-10-CM | POA: Insufficient documentation

## 2022-01-10 DIAGNOSIS — Z86718 Personal history of other venous thrombosis and embolism: Secondary | ICD-10-CM | POA: Insufficient documentation

## 2022-01-10 DIAGNOSIS — Z87891 Personal history of nicotine dependence: Secondary | ICD-10-CM | POA: Diagnosis not present

## 2022-01-10 DIAGNOSIS — K635 Polyp of colon: Secondary | ICD-10-CM | POA: Diagnosis not present

## 2022-01-10 DIAGNOSIS — R933 Abnormal findings on diagnostic imaging of other parts of digestive tract: Secondary | ICD-10-CM

## 2022-01-10 DIAGNOSIS — I129 Hypertensive chronic kidney disease with stage 1 through stage 4 chronic kidney disease, or unspecified chronic kidney disease: Secondary | ICD-10-CM | POA: Insufficient documentation

## 2022-01-10 DIAGNOSIS — R948 Abnormal results of function studies of other organs and systems: Secondary | ICD-10-CM | POA: Insufficient documentation

## 2022-01-10 DIAGNOSIS — K5669 Other partial intestinal obstruction: Secondary | ICD-10-CM | POA: Insufficient documentation

## 2022-01-10 DIAGNOSIS — Z7901 Long term (current) use of anticoagulants: Secondary | ICD-10-CM | POA: Insufficient documentation

## 2022-01-10 DIAGNOSIS — Z85828 Personal history of other malignant neoplasm of skin: Secondary | ICD-10-CM | POA: Diagnosis not present

## 2022-01-10 DIAGNOSIS — K2990 Gastroduodenitis, unspecified, without bleeding: Secondary | ICD-10-CM | POA: Diagnosis not present

## 2022-01-10 DIAGNOSIS — C186 Malignant neoplasm of descending colon: Secondary | ICD-10-CM | POA: Diagnosis not present

## 2022-01-10 DIAGNOSIS — K2270 Barrett's esophagus without dysplasia: Secondary | ICD-10-CM | POA: Insufficient documentation

## 2022-01-10 DIAGNOSIS — D682 Hereditary deficiency of other clotting factors: Secondary | ICD-10-CM | POA: Diagnosis not present

## 2022-01-10 DIAGNOSIS — K2980 Duodenitis without bleeding: Secondary | ICD-10-CM | POA: Insufficient documentation

## 2022-01-10 HISTORY — PX: ESOPHAGOGASTRODUODENOSCOPY (EGD) WITH PROPOFOL: SHX5813

## 2022-01-10 HISTORY — PX: COLONOSCOPY WITH PROPOFOL: SHX5780

## 2022-01-10 HISTORY — PX: POLYPECTOMY: SHX5525

## 2022-01-10 HISTORY — PX: SUBMUCOSAL TATTOO INJECTION: SHX6856

## 2022-01-10 HISTORY — PX: BIOPSY: SHX5522

## 2022-01-10 LAB — HM COLONOSCOPY

## 2022-01-10 LAB — GLUCOSE, CAPILLARY: Glucose-Capillary: 133 mg/dL — ABNORMAL HIGH (ref 70–99)

## 2022-01-10 SURGERY — COLONOSCOPY WITH PROPOFOL
Anesthesia: General

## 2022-01-10 MED ORDER — PROPOFOL 500 MG/50ML IV EMUL
INTRAVENOUS | Status: DC | PRN
  Administered 2022-01-10: 75 ug/kg/min via INTRAVENOUS

## 2022-01-10 MED ORDER — SPOT INK MARKER SYRINGE KIT
PACK | SUBMUCOSAL | Status: DC | PRN
  Administered 2022-01-10: 2.5 mL via SUBMUCOSAL

## 2022-01-10 MED ORDER — LACTATED RINGERS IV SOLN: INTRAVENOUS | Status: DC

## 2022-01-10 MED ORDER — PROPOFOL 10 MG/ML IV BOLUS
INTRAVENOUS | Status: DC | PRN
  Administered 2022-01-10: 40 mg via INTRAVENOUS
  Administered 2022-01-10: 80 mg via INTRAVENOUS
  Administered 2022-01-10: 40 mg via INTRAVENOUS

## 2022-01-10 MED ORDER — EPHEDRINE SULFATE (PRESSORS) 50 MG/ML IJ SOLN
INTRAMUSCULAR | Status: DC | PRN
  Administered 2022-01-10: 10 mg via INTRAVENOUS
  Administered 2022-01-10: 5 mg via INTRAVENOUS
  Administered 2022-01-10: 10 mg via INTRAVENOUS
  Administered 2022-01-10 (×2): 5 mg via INTRAVENOUS

## 2022-01-10 MED ORDER — OMEPRAZOLE 40 MG PO CPDR: 40.0000 mg | DELAYED_RELEASE_CAPSULE | Freq: Every day | ORAL | 3 refills | Status: DC

## 2022-01-10 NOTE — Anesthesia Preprocedure Evaluation (Signed)
Anesthesia Evaluation  Patient identified by MRN, date of birth, ID band Patient awake    Reviewed: Allergy & Precautions, H&P , NPO status , Patient's Chart, lab work & pertinent test results, reviewed documented beta blocker date and time   Airway Mallampati: II  TM Distance: >3 FB Neck ROM: full    Dental no notable dental hx.    Pulmonary neg pulmonary ROS, former smoker,    Pulmonary exam normal breath sounds clear to auscultation       Cardiovascular Exercise Tolerance: Good hypertension, negative cardio ROS   Rhythm:regular Rate:Normal     Neuro/Psych  Neuromuscular disease negative psych ROS   GI/Hepatic negative GI ROS, Neg liver ROS,   Endo/Other  negative endocrine ROSdiabetes, Type 2  Renal/GU CRFRenal disease  negative genitourinary   Musculoskeletal   Abdominal   Peds  Hematology  (+) Blood dyscrasia, anemia ,   Anesthesia Other Findings   Reproductive/Obstetrics negative OB ROS                             Anesthesia Physical Anesthesia Plan  ASA: 3  Anesthesia Plan: General   Post-op Pain Management:    Induction:   PONV Risk Score and Plan: Propofol infusion  Airway Management Planned:   Additional Equipment:   Intra-op Plan:   Post-operative Plan:   Informed Consent: I have reviewed the patients History and Physical, chart, labs and discussed the procedure including the risks, benefits and alternatives for the proposed anesthesia with the patient or authorized representative who has indicated his/her understanding and acceptance.     Dental Advisory Given  Plan Discussed with: CRNA  Anesthesia Plan Comments:         Anesthesia Quick Evaluation

## 2022-01-10 NOTE — Op Note (Signed)
[facility] Patient Name: Herbert Wood Procedure Date: 01/10/2022 10:29 AM MRN: [id number] Date of Birth: 1935/02/19 Attending MD: Maylon Peppers ,  CSN: [id number] Age: 87 Admit Type: Outpatient Procedure:                Colonoscopy Indications:              Iron deficiency anemia, Abnormal CT of the GI                            tract, History of diverticulitis with abscess with                            suspicious imaging Providers:                Maylon Peppers, Caprice Kluver, Kristine L. Risa Grill, Technician Referring MD:              Medicines:                Monitored Anesthesia Care Complications:            No immediate complications. Estimated Blood Loss:     Estimated blood loss: none. Procedure:                Pre-Anesthesia Assessment:                           - Prior to the procedure, a History and Physical                            was performed, and patient medications, allergies                            and sensitivities were reviewed. The patient's                            tolerance of previous anesthesia was reviewed.                           - The risks and benefits of the procedure and the                            sedation options and risks were discussed with the                            patient. All questions were answered and informed                            consent was obtained.                           - ASA Grade Assessment: III - A patient with severe                            systemic disease.  After obtaining informed consent, the colonoscope                            was passed under direct vision. Throughout the                            procedure, the patient's blood pressure, pulse, and                            oxygen saturations were monitored continuously. The                            [id number]-HQ190L ([id number]) scope was introduced through                            the  anus and advanced to the the cecum, identified                            by appendiceal orifice and ileocecal valve. The                            colonoscopy was performed without difficulty. The                            patient tolerated the procedure well. The quality                            of the bowel preparation was adequate to identify                            polyps 6 mm and larger in size. Scope In: 10:32:01 AM Scope Out: 10:59:55 AM Scope Withdrawal Time: 0 hours 19 minutes 33 seconds  Total Procedure Duration: 0 hours 27 minutes 54 seconds  Findings:      The perianal and digital rectal examinations were normal.      Two sessile polyps were found in the transverse colon. The polyps were 2       to 6 mm in size. These polyps were removed with a cold snare. Resection       and retrieval were complete.      A fungating and ulcerated partially obstructing mass was found at 42 cm       proximal to the anus. The mass was circumferential. The mass measured       three cm in length. No bleeding was present. This was biopsied with a       cold large-capacity forceps for histology. Area distal to the mass was       tattooed with an injection of 2.5 mL of [place] ink. Pediatric scope could       be passed with gentle maneuvering.      The retroflexed view of the distal rectum and anal verge was normal and       showed no anal or rectal abnormalities. Impression:               - Two 2 to 6 mm polyps in the transverse colon,  removed with a cold snare. Resected and retrieved.                           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 Biopsied. Tattooed.                           - The distal rectum and anal verge are normal on                            retroflexion view. Moderate Sedation:      Per Anesthesia Care Recommendation:           - Discharge patient to home (ambulatory).                            - Resume previous diet.                           - Await pathology results.                           - Repeat colonoscopy for surveillance based on                            pathology results.                           - CT C/A/P ASAP.                           - Check CEA level.                           - Consider referral to colorectal surgery and                            oncology depending on path results. Procedure Code(s):        --- Professional ---                           [phone], Colonoscopy, flexible; with removal of                            tumor(s), polyp(s), or other lesion(s) by snare                            technique                           45381, Colonoscopy, flexible; with directed                            submucosal injection(s), any substance  65784, 15, Colonoscopy, flexible; with biopsy,                            single or multiple Diagnosis Code(s):        --- Professional ---                           K63.5, Polyp of colon                           D49.0, Neoplasm of unspecified behavior of                            digestive system                           K56.690, Other partial intestinal obstruction                           D50.9, Iron deficiency anemia, unspecified                           R93.3, Abnormal findings on diagnostic imaging of                            other parts of digestive tract CPT copyright 2019 American [employer] Association. All rights reserved. The codes documented in this report are preliminary and upon coder review may  be revised to meet current compliance requirements. Maylon Peppers, MD Maylon Peppers,  01/10/2022 11:12:40 AM This report has been signed electronically. Number of Addenda: 0

## 2022-01-10 NOTE — Discharge Instructions (Addendum)
You are being discharged to home.  Resume your previous diet.  We are waiting for your pathology results.  Take Prilosec (omeprazole) 40 mg by mouth once a day.  Your physician has recommended a repeat colonoscopy for surveillance based on pathology results.  CT C/A/P ASAP. Consider referral to colorectal surgery and oncology depending on path results.

## 2022-01-10 NOTE — Transfer of Care (Signed)
Immediate Anesthesia Transfer of Care Note  Patient: Herbert Wood  Procedure(s) Performed: COLONOSCOPY WITH PROPOFOL ESOPHAGOGASTRODUODENOSCOPY (EGD) WITH PROPOFOL BIOPSY POLYPECTOMY SUBMUCOSAL TATTOO INJECTION  Patient Location: Short Stay  Anesthesia Type:General  Level of Consciousness: awake and patient cooperative  Airway & Oxygen Therapy: Patient Spontanous Breathing  Post-op Assessment: Report given to RN and Post -op Vital signs reviewed and stable  Post vital signs: Reviewed and stable  Last Vitals:  Vitals Value Taken Time  BP 126/46 01/10/22 1108  Temp 97.4 1110  Pulse 50 01/10/22 1108  Resp 18 01/10/22 1108  SpO2 96 % 01/10/22 1108    Last Pain:  Vitals:   01/10/22 1108  TempSrc: Oral  PainSc: 0-No pain         Complications: No notable events documented.

## 2022-01-10 NOTE — Op Note (Signed)
[facility] Patient Name: Herbert Wood Procedure Date: 01/10/2022 9:59 AM MRN: [id number] Date of Birth: 01-Apr-1935 Attending MD: Maylon Peppers ,  CSN: [id number] Age: 87 Admit Type: Outpatient Procedure:                Upper GI endoscopy Indications:              Iron deficiency anemia Providers:                Maylon Peppers, Caprice Kluver, Kristine L. Risa Grill, Technician Referring MD:              Medicines:                Monitored Anesthesia Care Complications:            No immediate complications. Estimated Blood Loss:     Estimated blood loss: none. Procedure:                Pre-Anesthesia Assessment:                           - Prior to the procedure, a History and Physical                            was performed, and patient medications, allergies                            and sensitivities were reviewed. The patient's                            tolerance of previous anesthesia was reviewed.                           - The risks and benefits of the procedure and the                            sedation options and risks were discussed with the                            patient. All questions were answered and informed                            consent was obtained.                           - ASA Grade Assessment: III - A patient with severe                            systemic disease.                           After obtaining informed consent, the endoscope was                            passed under direct vision. Throughout the  procedure, the patient's blood pressure, pulse, and                            oxygen saturations were monitored continuously. The                            [id number]-H190 ([id number]) scope was introduced through the                            mouth, and advanced to the second part of duodenum.                            The upper GI endoscopy was accomplished without                             difficulty. The patient tolerated the procedure                            well. Scope In: 10:16:48 AM Scope Out: 10:25:14 AM Total Procedure Duration: 0 hours 8 minutes 26 seconds  Findings:      The esophagus and gastroesophageal junction were examined with white       light and narrow band imaging (NBI). 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 These       changes involved the mucosa at the upper extent of the gastric folds (40       cm from the incisors) extending to the Z-line (39 cm from the incisors).       Two tongues of salmon-colored mucosa were present from 39 to 40 cm. The       maximum longitudinal extent of these esophageal mucosal changes was 1 cm       in length. This was biopsied with a cold forceps for histology.      A 5 cm hiatal hernia was present. This was a Hill Grade IV hernia.      Localized mildly erythematous mucosa without bleeding was found in the       gastric antrum. Biopsies were taken with a cold forceps for Helicobacter       pylori testing.      Localized mild inflammation characterized by congestion (edema) and       erythema was found in the first portion of the duodenum. Biopsies were       taken with a cold forceps for histology. Impression:               - Esophageal mucosal changes classified as                            Barrett's stage C0-M1 per Prague criteria. Biopsied.                           - 5 cm hiatal hernia.                           - Erythematous mucosa in the antrum. Biopsied.                           -  Duodenitis. Biopsied. Moderate Sedation:      Per Anesthesia Care Recommendation:           - Discharge patient to home (ambulatory).                           - Resume previous diet.                           - Await pathology results.                           - Use Prilosec (omeprazole) 40 mg PO daily. Procedure Code(s):        --- Professional ---                            [phone], Esophagogastroduodenoscopy, flexible,                            transoral; with biopsy, single or multiple Diagnosis Code(s):        --- Professional ---                           K22.70, Barrett's esophagus without dysplasia                           K44.9, Diaphragmatic hernia without obstruction or                            gangrene                           K31.89, Other diseases of stomach and duodenum                           K29.80, Duodenitis without bleeding                           D50.9, Iron deficiency anemia, unspecified CPT copyright 2019 American Medical Association. All rights reserved. The codes documented in this report are preliminary and upon coder review may  be revised to meet current compliance requirements. Maylon Peppers, MD Maylon Peppers,  01/10/2022 11:13:17 AM This report has been signed electronically. Number of Addenda: 0

## 2022-01-10 NOTE — Anesthesia Procedure Notes (Signed)
Date/Time: 01/10/2022 10:11 AM  Performed by: Vista Deck, CRNAPre-anesthesia Checklist: Patient identified, Emergency Drugs available, Suction available, Timeout performed and Patient being monitored Patient Re-evaluated:Patient Re-evaluated prior to induction Oxygen Delivery Method: Nasal Cannula

## 2022-01-10 NOTE — H&P (Signed)
Herbert Wood is an 87 y.o. male.   Chief Complaint: Iron deficiency anemia and follow-up of diverticulitis HPI: Herbert Wood is a 87 y.o. 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  Patient reports feeling well and denies any more episodes of abdominal pain after his recent hospitalization for diverticulitis.  States his stools have been dark.  He believes that the last day he presented some fresh blood in his stool but is not sure about this.  No abdominal pain, nausea, vomiting, fever, chills.  Past Medical History:  Diagnosis Date   Adenomatous colon polyp    Carotid bruit    Chronic dyspnea    Diabetes ([place])    type 2   Diverticular disease    DVT (deep venous thrombosis) (HCC)    lower limb   Factor V deficiency (HCC)    Hearing loss    bilateral   Hyperlipemia    Hypertension    Inguinal hernia    Kidney disease    stage 3   Lung nodule    Lymphadenopathy    Malignant neoplasm (HCC)    salivary gland   Murmur    Osteoarthritis    Senile purpura ([place])     Past Surgical History:  Procedure Laterality Date   HAND / FINGER LESION EXCISION     HERNIA REPAIR     SURGERY OF LIP     skin cancer    History reviewed. No pertinent family history. Social History:  reports that he has quit smoking. His smoking use included cigarettes. He has been exposed to tobacco smoke. He has never used smokeless tobacco. He reports that he does not drink alcohol and does not use drugs.  Allergies:  Allergies  Allergen Reactions   Gadobenate Nausea And Vomiting    Immediately upon the infusion of 56m multihance contrast  Patient had exteme nausea and vomiting.   No other symptoms noted .  No injury.  MRI scan was completed after patient felt  better.   Immediately upon the infusion of 176mmultihance contrast  Patient had exteme nausea and vomiting.   No other symptoms noted .  No injury.  MRI scan was completed after patient felt better.       Medications Prior to Admission  Medication Sig Dispense Refill   albuterol (VENTOLIN HFA) 108 (90 Base) MCG/ACT inhaler Inhale 1-2 puffs into the lungs every 6 (six) hours as needed for wheezing or shortness of breath.     amitriptyline (ELAVIL) 10 MG tablet Take 10 mg by mouth every evening.     amLODipine (NORVASC) 5 MG tablet Take 5 mg by mouth 2 (two) times daily.     Ascorbic Acid (VITAMIN C PO) Take 1 tablet by mouth every evening.     atorvastatin (LIPITOR) 40 MG tablet Take 40 mg by mouth every evening.     B Complex-C (B-COMPLEX WITH VITAMIN C) tablet Take 1 tablet by mouth every evening.     carvedilol (COREG) 6.25 MG tablet Take 6.25 mg by mouth 2 (two) times daily with a meal.     dorzolamide (TRUSOPT) 2 % ophthalmic solution Place 1 drop into the left eye 2 (two) times daily.     famotidine (PEPCID) 10 MG tablet Take 10 mg by mouth in the morning.  ferrous sulfate 325 (65 FE) MG tablet Take 325 mg by mouth in the morning and at bedtime. Iron Nature's Bounty     hydrALAZINE (APRESOLINE) 25 MG tablet Take 50 mg by mouth in the morning and at bedtime.     irbesartan (AVAPRO) 300 MG tablet Take 300 mg by mouth every evening.     latanoprost (XALATAN) 0.005 % ophthalmic solution Place 1 drop into both eyes at bedtime.     linagliptin (TRADJENTA) 5 MG TABS tablet Take 5 mg by mouth in the morning.     LORazepam (ATIVAN) 0.5 MG tablet Take 0.5 mg by mouth 2 (two) times daily as needed for anxiety.     Methylcobalamin (METHYL B-12) 1000 MCG LOZG Take 1,000 mcg by mouth every evening.     Misc Natural Products (PROSTATE SUPPORT PO) Take 1 capsule by mouth every evening. NOW Prostate Support     apixaban (ELIQUIS) 5 MG TABS tablet Take 5 mg by mouth 2 (two) times daily.      furosemide (LASIX) 20 MG tablet Take 20 mg by mouth daily as needed for fluid. One daily as needed     polyethylene glycol-electrolytes (TRILYTE) 420 g solution Take 4,000 mLs by mouth as directed. 4000 mL 0    Results for orders placed or performed during the hospital encounter of 01/10/22 (from the past 48 hour(s))  Glucose, capillary     Status: Abnormal   Collection Time: 01/10/22  9:43 AM  Result Value Ref Range   Glucose-Capillary 133 (H) 70 - 99 mg/dL    Comment: Glucose reference range applies only to samples taken after fasting for at least 8 hours.   No results found.  Review of Systems  Constitutional: Negative.   HENT: Negative.    Eyes: Negative.   Respiratory: Negative.    Cardiovascular: Negative.   Gastrointestinal: Negative.   Endocrine: Negative.   Genitourinary: Negative.   Musculoskeletal: Negative.   Skin: Negative.   Allergic/Immunologic: Negative.   Neurological: Negative.   Hematological: Negative.   Psychiatric/Behavioral: Negative.      Blood pressure (!) 160/57, pulse (!) 57, temperature 98.7 F (37.1 C), temperature source Oral, resp. rate 17, height '5\' 10"'$  (1.778 m), weight 93.4 kg, SpO2 100 %. Physical Exam  GENERAL: The patient is AO x3, in no acute distress. HEENT: Head is normocephalic and atraumatic. EOMI are intact. Mouth is well hydrated and without lesions. NECK: Supple. No masses LUNGS: Clear to auscultation. No presence of rhonchi/wheezing/rales. Adequate chest expansion HEART: RRR, normal s1 and s2. ABDOMEN: Soft, nontender, no guarding, no peritoneal signs, and nondistended. BS +. No masses. EXTREMITIES: Without any cyanosis, clubbing, rash, lesions or edema. NEUROLOGIC: AOx3, no focal motor deficit. SKIN: no jaundice, no rashes  Assessment/Plan Herbert Wood is a 87 y.o. 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  We will proceed with EGD and colonoscopy.  Harvel Quale, MD 01/10/2022, 9:54 AM

## 2022-01-11 LAB — CEA: CEA: 7.9 ng/mL — ABNORMAL HIGH (ref 0.0–4.7)

## 2022-01-11 LAB — SURGICAL PATHOLOGY

## 2022-01-13 ENCOUNTER — Other Ambulatory Visit (INDEPENDENT_AMBULATORY_CARE_PROVIDER_SITE_OTHER): Payer: Self-pay

## 2022-01-13 ENCOUNTER — Ambulatory Visit (HOSPITAL_COMMUNITY)
Admission: RE | Admit: 2022-01-13 | Discharge: 2022-01-13 | Disposition: A | Discharge status: Home / Self Care | Payer: Medicare Other | Source: Ambulatory Visit | Attending: Gastroenterology | Admitting: Gastroenterology

## 2022-01-13 DIAGNOSIS — C189 Malignant neoplasm of colon, unspecified: Secondary | ICD-10-CM | POA: Insufficient documentation

## 2022-01-13 MED ORDER — IOHEXOL 9 MG/ML PO SOLN
ORAL | Status: AC
  Filled 2022-01-13: qty 1000

## 2022-01-13 MED ORDER — IOHEXOL 300 MG/ML  SOLN
100.0000 mL | Freq: Once | INTRAMUSCULAR | Status: AC | PRN
Start: 2022-01-13 — End: 2022-01-13
  Administered 2022-01-13: 100 mL via INTRAVENOUS

## 2022-01-13 NOTE — Anesthesia Postprocedure Evaluation (Signed)
Anesthesia Post Note  Patient: Herbert Wood  Procedure(s) Performed: COLONOSCOPY WITH PROPOFOL ESOPHAGOGASTRODUODENOSCOPY (EGD) WITH PROPOFOL BIOPSY POLYPECTOMY SUBMUCOSAL TATTOO INJECTION  Patient location during evaluation: Phase II Anesthesia Type: General Level of consciousness: awake Pain management: pain level controlled Vital Signs Assessment: post-procedure vital signs reviewed and stable Respiratory status: spontaneous breathing and respiratory function stable Cardiovascular status: blood pressure returned to baseline and stable Postop Assessment: no headache and no apparent nausea or vomiting Anesthetic complications: no Comments: Late entry   No notable events documented.   Last Vitals:  Vitals:   01/10/22 0935 01/10/22 1108  BP: (!) 160/57 (!) 126/46  Pulse: (!) 57 (!) 50  Resp: 17 18  Temp: 37.1 C (!) 36.4 C  SpO2: 100% 96%    Last Pain:  Vitals:   01/11/22 0947  TempSrc:   PainSc: 0-No pain                 Louann Sjogren

## 2022-01-16 NOTE — Progress Notes (Signed)
Patient has newly diagnosed colon cancer and CEA is part of the evaluation of this malignancy

## 2022-01-17 ENCOUNTER — Encounter (INDEPENDENT_AMBULATORY_CARE_PROVIDER_SITE_OTHER): Payer: Self-pay | Admitting: *Deleted

## 2022-01-19 ENCOUNTER — Encounter (HOSPITAL_COMMUNITY): Payer: Self-pay | Admitting: Gastroenterology

## 2022-01-25 ENCOUNTER — Inpatient Hospital Stay (HOSPITAL_COMMUNITY): Payer: Medicare Other

## 2022-01-25 ENCOUNTER — Encounter (HOSPITAL_COMMUNITY): Payer: Self-pay | Admitting: Hematology

## 2022-01-25 ENCOUNTER — Inpatient Hospital Stay (HOSPITAL_COMMUNITY): Payer: Medicare Other | Attending: Hematology | Admitting: Hematology

## 2022-01-25 DIAGNOSIS — R0600 Dyspnea, unspecified: Secondary | ICD-10-CM | POA: Insufficient documentation

## 2022-01-25 DIAGNOSIS — E785 Hyperlipidemia, unspecified: Secondary | ICD-10-CM | POA: Insufficient documentation

## 2022-01-25 DIAGNOSIS — M1990 Unspecified osteoarthritis, unspecified site: Secondary | ICD-10-CM | POA: Insufficient documentation

## 2022-01-25 DIAGNOSIS — Z8616 Personal history of COVID-19: Secondary | ICD-10-CM | POA: Insufficient documentation

## 2022-01-25 DIAGNOSIS — D509 Iron deficiency anemia, unspecified: Secondary | ICD-10-CM | POA: Insufficient documentation

## 2022-01-25 DIAGNOSIS — C187 Malignant neoplasm of sigmoid colon: Secondary | ICD-10-CM | POA: Diagnosis present

## 2022-01-25 DIAGNOSIS — E119 Type 2 diabetes mellitus without complications: Secondary | ICD-10-CM | POA: Insufficient documentation

## 2022-01-25 DIAGNOSIS — Z86718 Personal history of other venous thrombosis and embolism: Secondary | ICD-10-CM | POA: Diagnosis not present

## 2022-01-25 DIAGNOSIS — Z7901 Long term (current) use of anticoagulants: Secondary | ICD-10-CM | POA: Insufficient documentation

## 2022-01-25 DIAGNOSIS — I10 Essential (primary) hypertension: Secondary | ICD-10-CM | POA: Diagnosis not present

## 2022-01-25 DIAGNOSIS — Z8589 Personal history of malignant neoplasm of other organs and systems: Secondary | ICD-10-CM | POA: Insufficient documentation

## 2022-01-25 DIAGNOSIS — N183 Chronic kidney disease, stage 3 unspecified: Secondary | ICD-10-CM | POA: Insufficient documentation

## 2022-01-25 DIAGNOSIS — R0989 Other specified symptoms and signs involving the circulatory and respiratory systems: Secondary | ICD-10-CM | POA: Diagnosis not present

## 2022-01-25 DIAGNOSIS — R011 Cardiac murmur, unspecified: Secondary | ICD-10-CM | POA: Insufficient documentation

## 2022-01-25 DIAGNOSIS — Z8052 Family history of malignant neoplasm of bladder: Secondary | ICD-10-CM | POA: Diagnosis not present

## 2022-01-25 DIAGNOSIS — D692 Other nonthrombocytopenic purpura: Secondary | ICD-10-CM | POA: Insufficient documentation

## 2022-01-25 DIAGNOSIS — Z79899 Other long term (current) drug therapy: Secondary | ICD-10-CM | POA: Insufficient documentation

## 2022-01-25 DIAGNOSIS — Z87891 Personal history of nicotine dependence: Secondary | ICD-10-CM | POA: Diagnosis not present

## 2022-01-25 DIAGNOSIS — D682 Hereditary deficiency of other clotting factors: Secondary | ICD-10-CM | POA: Insufficient documentation

## 2022-01-25 DIAGNOSIS — Z8049 Family history of malignant neoplasm of other genital organs: Secondary | ICD-10-CM | POA: Diagnosis not present

## 2022-01-25 LAB — CBC WITH DIFFERENTIAL/PLATELET
Abs Immature Granulocytes: 0.03 10*3/uL (ref 0.00–0.07)
Basophils Absolute: 0 10*3/uL (ref 0.0–0.1)
Basophils Relative: 1 %
Eosinophils Absolute: 0.2 10*3/uL (ref 0.0–0.5)
Eosinophils Relative: 3 %
HCT: 27.7 % — ABNORMAL LOW (ref 39.0–52.0)
Hemoglobin: 9 g/dL — ABNORMAL LOW (ref 13.0–17.0)
Immature Granulocytes: 0 %
Lymphocytes Relative: 14 %
Lymphs Abs: 1 10*3/uL (ref 0.7–4.0)
MCH: 32.8 pg (ref 26.0–34.0)
MCHC: 32.5 g/dL (ref 30.0–36.0)
MCV: 101.1 fL — ABNORMAL HIGH (ref 80.0–100.0)
Monocytes Absolute: 0.7 10*3/uL (ref 0.1–1.0)
Monocytes Relative: 10 %
Neutro Abs: 4.9 10*3/uL (ref 1.7–7.7)
Neutrophils Relative %: 72 %
Platelets: 203 10*3/uL (ref 150–400)
RBC: 2.74 MIL/uL — ABNORMAL LOW (ref 4.22–5.81)
RDW: 14.6 % (ref 11.5–15.5)
WBC: 6.7 10*3/uL (ref 4.0–10.5)
nRBC: 0 % (ref 0.0–0.2)

## 2022-01-25 LAB — FOLATE: Folate: 33.9 ng/mL (ref >5.9)

## 2022-01-25 LAB — IRON AND TIBC
Iron: 211 ug/dL — ABNORMAL HIGH (ref 45–182)
Saturation Ratios: 53 % — ABNORMAL HIGH (ref 17.9–39.5)
TIBC: 396 ug/dL (ref 250–450)
UIBC: 185 ug/dL

## 2022-01-25 LAB — FERRITIN: Ferritin: 17 ng/mL — ABNORMAL LOW (ref 24–336)

## 2022-01-25 LAB — VITAMIN B12: Vitamin B-12: 1346 pg/mL — ABNORMAL HIGH (ref 180–914)

## 2022-01-25 NOTE — Patient Instructions (Addendum)
[place] at [facility] Discharge Instructions  You were seen and examined today by Dr. Delton Coombes. Dr. Delton Coombes is a medical oncologist, meaning that he specializes in the treatment of cancer diagnoses. Dr. Delton Coombes discussed your past medical history, family history of cancers, and the events that led to you being here today.  You were referred to Dr. Delton Coombes by Dr. Jenetta Downer due to a new diagnosis of colon cancer.  Dr. Delton Coombes has reviewed your most recent CT scan, which appears that the cancer is localized to the colon. This is good news. Once your cardiologist has cleared you for surgery, you may proceed with surgery. You will need to follow-up with Dr. Delton Coombes approximately one month after surgery. You will need to hold the Eliquis for 4 days prior to surgery. Restart Eliquis after surgery according to Dr. Orest Dikes recommendation.  Due to your anemia, Dr. Delton Coombes has recommended additional labs to assess that further.  If you can find information about when you tested positive for COVID-19 and having the blood clot, it may be possible to discontinue your Eliquis. You can fax this information to the office or bring it with you to your next office visit.  Follow-up as scheduled.   Thank you for choosing [place] at [facility] to provide your oncology and hematology care.  To afford each patient quality time with our provider, please arrive at least 15 minutes before your scheduled appointment time.   If you have a lab appointment with the [facility] please come in thru the Main Entrance and check in at the main information desk.  You need to re-schedule your appointment should you arrive 10 or more minutes late.  We strive to give you quality time with our providers, and arriving late affects you and other patients whose appointments are after yours.  Also, if you no show three or more times for appointments you may be  dismissed from the clinic at the providers discretion.     Again, thank you for choosing [facility] ([facility]  Our hope is that these requests will decrease the amount of time that you wait before being seen by our physicians.       _____________________________________________________________  Should you have questions after your visit to [facility], please contact our office at [phone] and follow the prompts.  Our office hours are 8:00 a.m. and 4:30 p.m. Monday - Friday.  Please note that voicemails left after 4:00 p.m. may not be returned until the following business day.  We are closed weekends and major holidays.  You do have access to a nurse 24-7, just call the main number to the clinic [phone] and do not press any options, hold on the line and a nurse will answer the phone.    For prescription refill requests, have your pharmacy contact our office and allow 72 hours.

## 2022-01-25 NOTE — Progress Notes (Signed)
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 87 y.o. male is here because of evaluation for colon cancer, at the request of Dr. Jenetta Downer.  Today he reports feeling well, and he is accompanied by his wife. Prior to his colonoscopy his hemoglobin levels had decreased. He was then hospitalized for 4 days due to diverticulitis starting on 4/4, and he then underwent colonoscopy on 6/13. He reports hematochezia while hospitalized for diverticulitis and when preparing for the colonoscopy.  He has lost 20 lbs since 5/6. He denies history of A-fib. He reports a left leg DVT 2-3 years ago at which time he was placed on Eliquis; prior to his DVT he was infected with Covid twice. The DVT occurred 4-6 weeks following his second Covid infection, but he reports he was not bed bound at the time of these infections.    He currently lives at home with his wife. His mother had bladder and uterine cancer. 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 He quit smoking 1995 after smoking 1-2 ppd for 25 years.   MEDICAL HISTORY:  Past Medical History:  Diagnosis Date   Adenomatous colon polyp    Carotid bruit    Chronic dyspnea    Diabetes ([facility])    type 2   Diverticular disease    DVT (deep venous thrombosis) (HCC)    lower limb   Factor V deficiency (HCC)    Hearing loss    bilateral   Hyperlipemia    Hypertension    Inguinal hernia    Kidney disease    stage 3   Lung nodule     Lymphadenopathy    Malignant neoplasm (HCC)    salivary gland   Murmur    Osteoarthritis    Senile purpura ([place])     SURGICAL HISTORY: Past Surgical History:  Procedure Laterality Date   BIOPSY  01/10/2022   Procedure: BIOPSY;  Surgeon: Harvel Quale, MD;  [facility];  Service: Gastroenterology;;   COLONOSCOPY WITH PROPOFOL N/A 01/10/2022   Procedure: COLONOSCOPY WITH PROPOFOL;  Surgeon: Harvel Quale, MD;  [facility];  Service: Gastroenterology;  Laterality: N/A;  per Darius Bump, pt to arrive at 9:15   ESOPHAGOGASTRODUODENOSCOPY (EGD) WITH PROPOFOL N/A 01/10/2022   Procedure: ESOPHAGOGASTRODUODENOSCOPY (EGD) WITH PROPOFOL;  Surgeon: Harvel Quale, MD;  [facility];  Service: Gastroenterology;  Laterality: N/A;   HAND / FINGER LESION EXCISION     HERNIA REPAIR     POLYPECTOMY  01/10/2022   Procedure: POLYPECTOMY;  Surgeon: Harvel Quale, MD;  [facility];  Service: Gastroenterology;;   SUBMUCOSAL TATTOO INJECTION  01/10/2022   Procedure: SUBMUCOSAL TATTOO INJECTION;  Surgeon: Harvel Quale, MD;  [facility];  Service: Gastroenterology;;   SURGERY OF LIP     skin cancer    SOCIAL HISTORY: Social History   Socioeconomic History   Marital status: Married    Spouse name: Not on file   Number of children: Not on file   Years  of education: Not on file   Highest education level: Not on file  Occupational History   Not on file  Tobacco Use   Smoking status: Former    Types: Cigarettes    Passive exposure: Past   Smokeless tobacco: Never  Vaping Use   Vaping Use: Never used  Substance and Sexual Activity   Alcohol use: Never   Drug use: Never   Sexual activity: Not on file  Other Topics Concern   Not on file  Social History Narrative   Not on file   Social Determinants of Health   Financial Resource Strain: Not on file  Food Insecurity: Not on file   Transportation Needs: Not on file  Physical Activity: Not on file  Stress: Not on file  Social Connections: Not on file  Intimate Partner Violence: Not on file    FAMILY HISTORY: History reviewed. No pertinent family history.  ALLERGIES:  is allergic to gadobenate.  MEDICATIONS:  Current Outpatient Medications  Medication Sig Dispense Refill   amitriptyline (ELAVIL) 10 MG tablet Take 10 mg by mouth every evening.     amLODipine (NORVASC) 5 MG tablet Take 5 mg by mouth 2 (two) times daily.     apixaban (ELIQUIS) 2.5 MG TABS tablet Take 5 mg by mouth 2 (two) times daily.     Ascorbic Acid (VITAMIN C PO) Take 1 tablet by mouth every evening.     atorvastatin (LIPITOR) 40 MG tablet Take 40 mg by mouth every evening.     B Complex-C (B-COMPLEX WITH VITAMIN C) tablet Take 1 tablet by mouth every evening.     Cholecalciferol (VITAMIN D3 PO) Take by mouth.     dorzolamide (TRUSOPT) 2 % ophthalmic solution Place 1 drop into the left eye 2 (two) times daily.     ferrous sulfate 325 (65 FE) MG tablet Take 325 mg by mouth in the morning and at bedtime. Iron Nature's Bounty     FOLIC ACID PO Take by mouth.     hydrALAZINE (APRESOLINE) 25 MG tablet Take 50 mg by mouth in the morning and at bedtime.     irbesartan (AVAPRO) 300 MG tablet Take 300 mg by mouth every evening.     latanoprost (XALATAN) 0.005 % ophthalmic solution Place 1 drop into both eyes at bedtime.     linagliptin (TRADJENTA) 5 MG TABS tablet Take 5 mg by mouth in the morning.     loratadine (CLARITIN) 10 MG tablet Take 10 mg by mouth daily.     Methylcobalamin (METHYL B-12) 1000 MCG LOZG Take 1,000 mcg by mouth every evening.     Misc Natural Products (PROSTATE SUPPORT PO) Take 1 capsule by mouth every evening. NOW Prostate Support     omeprazole (PRILOSEC) 40 MG capsule Take 1 capsule (40 mg total) by mouth daily. 90 capsule 3   tobramycin (TOBREX) 0.3 % ophthalmic solution SMARTSIG:In Eye(s)     albuterol (VENTOLIN HFA) 108  (90 Base) MCG/ACT inhaler Inhale 1-2 puffs into the lungs every 6 (six) hours as needed for wheezing or shortness of breath. (Patient not taking: Reported on 01/25/2022)     carvedilol (COREG) 6.25 MG tablet Take 6.25 mg by mouth 2 (two) times daily with a meal. (Patient not taking: Reported on 01/25/2022)     furosemide (LASIX) 20 MG tablet Take 20 mg by mouth daily as needed for fluid. One daily as needed (Patient not taking: Reported on 01/25/2022)     LORazepam (ATIVAN) 0.5 MG tablet Take  0.5 mg by mouth 2 (two) times daily as needed for anxiety. (Patient not taking: Reported on 01/25/2022)     No current facility-administered medications for this visit.    REVIEW OF SYSTEMS:   Review of Systems  Constitutional:  Positive for fatigue. Negative for appetite change.  Respiratory:  Positive for shortness of breath.   Neurological:  Positive for dizziness.  Psychiatric/Behavioral:  The patient is nervous/anxious.   All other systems reviewed and are negative.    PHYSICAL EXAMINATION: ECOG PERFORMANCE STATUS: 1 - Symptomatic but completely ambulatory  Vitals:   01/25/22 1324  BP: 129/62  Pulse: (!) 50  Resp: 18  Temp: 98.3 F (36.8 C)  SpO2: 95%   Filed Weights   01/25/22 1324  Weight: 200 lb 8 oz (90.9 kg)   Physical Exam Vitals reviewed.  Constitutional:      Appearance: Normal appearance.  Cardiovascular:     Rate and Rhythm: Normal rate and regular rhythm.     Pulses: Normal pulses.     Heart sounds: Normal heart sounds.  Pulmonary:     Effort: Pulmonary effort is normal.     Breath sounds: Normal breath sounds.  Abdominal:     Palpations: Abdomen is soft. There is no hepatomegaly, splenomegaly or mass.     Tenderness: There is no abdominal tenderness.  Musculoskeletal:     Right lower leg: No edema.     Left lower leg: No edema.  Lymphadenopathy:     Cervical: No cervical adenopathy.     Right cervical: No superficial cervical adenopathy.    Left cervical: No  superficial cervical adenopathy.     Upper Body:     Right upper body: No supraclavicular or axillary adenopathy.     Left upper body: No supraclavicular or axillary adenopathy.  Neurological:     General: No focal deficit present.     Mental Status: He is alert and oriented to person, place, and time.  Psychiatric:        Mood and Affect: Mood normal.        Behavior: Behavior normal.      LABORATORY DATA:  I have reviewed the data as listed    Latest Ref Rng & Units 01/25/2022    2:40 PM 01/05/2022    1:25 PM 12/03/2021    8:19 PM  CBC  WBC 4.0 - 10.5 K/uL 6.7  6.6  9.5   Hemoglobin 13.0 - 17.0 g/dL 9.0  9.1  9.0   Hematocrit 39.0 - 52.0 % 27.7  28.4  27.1   Platelets 150 - 400 K/uL 203  213  170       Latest Ref Rng & Units 01/05/2022    1:25 PM 12/03/2021    8:19 PM 11/04/2021    6:08 AM  CMP  Glucose 70 - 99 mg/dL 135  136  112   BUN 8 - 23 mg/dL 36  46  32   Creatinine 0.61 - 1.24 mg/dL 1.40  1.74  1.68   Sodium 135 - 145 mmol/L 133  135  138   Potassium 3.5 - 5.1 mmol/L 4.4  4.4  3.9   Chloride 98 - 111 mmol/L 106  107  109   CO2 22 - 32 mmol/L '20  22  21   '$ Calcium 8.9 - 10.3 mg/dL 8.5  8.6  8.4     RADIOGRAPHIC STUDIES: I have personally reviewed the radiological images as listed and agreed with the findings in the report. CT  CHEST ABDOMEN PELVIS W CONTRAST  Result Date: 01/15/2022 CLINICAL DATA:  Colon cancer staging. EXAM: CT CHEST, ABDOMEN, AND PELVIS WITH CONTRAST TECHNIQUE: Multidetector CT imaging of the chest, abdomen and pelvis was performed following the standard protocol during bolus administration of intravenous contrast. RADIATION DOSE REDUCTION: This exam was performed according to the departmental dose-optimization program which includes automated exposure control, adjustment of the mA and/or kV according to patient size and/or use of iterative reconstruction technique. CONTRAST:  134m OMNIPAQUE IOHEXOL 300 MG/ML  SOLN COMPARISON:  CT abdomen pelvis dated  11/01/2021. FINDINGS: CT CHEST FINDINGS Cardiovascular: There is no cardiomegaly or pericardial effusion. Three-vessel coronary vascular calcification. Moderate atherosclerotic calcification of the thoracic aorta. No aneurysmal dilatation or dissection. The origins of the great vessels of the aortic arch and the central pulmonary arteries appear patent as visualized. Mediastinum/Nodes: No hilar or mediastinal adenopathy. The esophagus and the thyroid gland are grossly unremarkable. No mediastinal fluid collection. Lungs/Pleura: There are several scattered areas of interstitial irregularity and nodularity primarily involving the right upper lobe, likely related to chronic changes and interstitial crowding (for example 46/3 and 37/3). There is a 2 mm right middle lobe subpleural nodule (86/3). A 5 mm right lower lobe nodule (170/3), a 7 mm right lower lobe posterior subpleural nodule (116/3). No focal consolidation, pleural effusion, or pneumothorax. The central airways are patent. Musculoskeletal: Osteopenia with degenerative changes of the spine. No acute osseous pathology. No suspicious bone lesions. CT ABDOMEN PELVIS FINDINGS No intra-abdominal free air or free fluid. Hepatobiliary: The liver is unremarkable. The gallbladder is unremarkable. Pancreas: Unremarkable. No pancreatic ductal dilatation or surrounding inflammatory changes. Spleen: Normal in size without focal abnormality. Adrenals/Urinary Tract: The adrenal glands are unremarkable. Multiple bilateral renal cysts and additional subcentimeter hypodense lesions which are too small to characterize. There is no hydronephrosis on either side. There is symmetric enhancement and excretion of contrast by both kidneys. The visualized ureters and urinary bladder appear unremarkable. Stomach/Bowel: There is severe sigmoid diverticulosis and diffuse colonic diverticula without active inflammatory changes. There is focal area of thickening of the splenic flexure and  proximal descending colon which has improved since the prior CT likely representing improvement of the previously seen diverticulitis. Underlying mass is not excluded. Correlation with colon cancer screening markers recommended. Colonoscopy may provide better evaluation if clinically indicated. There is a 3 cm distal duodenal diverticulum without active inflammation. There is no bowel obstruction. The appendix is normal. Vascular/Lymphatic: Advanced aortoiliac atherosclerotic disease. The IVC is unremarkable. No portal venous gas. There is no adenopathy. Reproductive: Enlarged prostate gland measuring 6 cm in transverse axial diameter. The seminal vesicles are symmetric. Partially visualized bilateral hydroceles. Bilateral vasectomy clips. Other: None Musculoskeletal: Osteopenia degenerative changes of the spine. No acute osseous pathology. No suspicious bone lesions. IMPRESSION: 1. No evidence of metastatic disease in the chest, abdomen, or pelvis. 2. Focal area of thickening of the splenic flexure and proximal descending colon which has improved since the prior CT likely representing improvement of the previously seen diverticulitis. Underlying mass is not excluded. Correlation with colon cancer screening markers recommended. Colonoscopy may provide better evaluation if clinically indicated. 3. Several tiny pulmonary nodules as well as scattered areas of interstitial irregularity in the right upper lobe, likely post inflammatory. No suspicious nodule. Attention on follow-up imaging recommended. 4. Aortic Atherosclerosis (ICD10-I70.0). Electronically Signed   By: AAnner CreteM.D.   On: 01/15/2022 22:24    ASSESSMENT:  Stage II/III sigmoid colon cancer: - Anemia with hemoglobin of 8, recent  admission with diverticulitis.  He also had weight loss of 40 pounds in the last 6 months. - Colonoscopy by Dr. Jenetta Downer (01/10/2022): Fungating and ulcerated partially obstructing mass found at 42 cm proximal to the  anus.  Mass is circumferential and measured 3 cm in length. - Pathology: Invasive moderately differentiated adenocarcinoma of the descending colon mass.  Transverse colon polypectomy shows fragments of at least intramucosal adenocarcinoma and high-grade dysplasia. - CT CAP (01/13/2022): No evidence of metastatic disease in the chest, abdomen or pelvis.  Several tiny lung nodules and scattered areas of interstitial irregularity in the right upper lobe likely postinflammatory. - Preoperative CEA (01/10/2022): 7.9.   Social/family history: - Lives at home with his wife.  Wife works at [employer] nephrology office in [place].  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  Quit smoking in 1995.  Smoked 1 to 2 packs/day for 25 years. - Son had DVT.  Son is also a carrier for factor V Leiden.  Mother had bladder cancer and uterine cancer.   PLAN:  Sigmoid colon cancer: - We have reviewed results of the colonoscopy and the pathology report in detail. - We have reviewed CT imaging results which did not show any evidence of metastatic disease. - He was already evaluated by Dr. Dema Severin at [facility] surgery. - He may proceed with the planned colon cancer surgery. - Plan to see me back in 4 weeks to discuss results and the need for any further therapy.  2.  Left leg DVT: - Patient had a left leg DVT approximately 2 years ago.  His wife thinks that DVT has happened about 4 to 6 weeks after his second COVID infection.  He was very minimally symptomatic from the COVID infection.  At the time of the DVT, he was completely mobile and did not have any surgery. - Patient was heterozygous for factor V Leiden as checked by Dr. Lonia Chimera on 09/20/2011. - I have recommended that he get the records of timing of DVT and the COVID test.  If it is within 4 to 6 weeks of his COVID diagnosis, we may safely discontinue anticoagulation considering it can be provoked by COVID infection.  If it is  unprovoked DVT, may continue low intensity anticoagulation. - May hold Eliquis 3-4 days prior to surgery.  May restart Eliquis postoperatively when hemostasis has been achieved at the same dose patient was receiving preoperatively.  This is typically 2 days after high bleeding risk procedure.  3.  Macrocytic anemia: - I have repeated his CBC today.  Hemoglobin is 9 and MCV is 101.  Ferritin is low at 17 and percent saturation 53. - J49 is normal.  Folic acid is pending.  I have also ordered MMA, copper and SPEP levels. - He had received 2 infusions of Ferrlecit to 50 mg when he was hospitalized on 11/02/2021 and 11/03/2021. - I will schedule him for Feraheme weekly x2 to improve his hemoglobin prior to surgery.   All questions were answered. The patient knows to call the clinic with any problems, questions or concerns.   Derek Jack, MD, 01/25/22 4:18 PM  [facility] [phone]   I, Thana Ates, am acting as a scribe for Dr. Derek Jack.  I, Derek Jack MD, have reviewed the above documentation for accuracy and completeness, and I agree with the above.

## 2022-01-27 ENCOUNTER — Encounter (HOSPITAL_COMMUNITY): Payer: Self-pay

## 2022-01-27 LAB — PROTEIN ELECTROPHORESIS, SERUM
A/G Ratio: 1.3 (ref 0.7–1.7)
Albumin ELP: 3.6 g/dL (ref 2.9–4.4)
Alpha-1-Globulin: 0.2 g/dL (ref 0.0–0.4)
Alpha-2-Globulin: 0.7 g/dL (ref 0.4–1.0)
Beta Globulin: 0.9 g/dL (ref 0.7–1.3)
Gamma Globulin: 0.9 g/dL (ref 0.4–1.8)
Globulin, Total: 2.7 g/dL (ref 2.2–3.9)
Total Protein ELP: 6.3 g/dL (ref 6.0–8.5)

## 2022-01-27 LAB — COPPER, SERUM: Copper: 92 ug/dL (ref 69–132)

## 2022-01-27 LAB — METHYLMALONIC ACID, SERUM: Methylmalonic Acid, Quantitative: 151 nmol/L (ref 0–378)

## 2022-02-01 ENCOUNTER — Inpatient Hospital Stay (HOSPITAL_COMMUNITY): Payer: Medicare Other | Attending: Hematology

## 2022-02-01 VITALS — BP 132/47 | HR 44 | Temp 98.2°F | Resp 18

## 2022-02-01 DIAGNOSIS — D509 Iron deficiency anemia, unspecified: Secondary | ICD-10-CM | POA: Diagnosis not present

## 2022-02-01 DIAGNOSIS — N183 Chronic kidney disease, stage 3 unspecified: Secondary | ICD-10-CM

## 2022-02-01 MED ORDER — SODIUM CHLORIDE 0.9 % IV SOLN: Freq: Once | INTRAVENOUS | Status: AC

## 2022-02-01 MED ORDER — SODIUM CHLORIDE 0.9 % IV SOLN
510.0000 mg | Freq: Once | INTRAVENOUS | Status: AC
  Administered 2022-02-01: 510 mg via INTRAVENOUS
  Filled 2022-02-01: qty 510

## 2022-02-01 NOTE — Progress Notes (Signed)
Patient presents today for Feraheme infusion per providers order.  Vital signs WNL  Patient has no new complaints at this time.    Peripheral IV started and blood return noted pre and post infusion.  Feraheme given today per MD orders.  Stable during infusion without adverse affects.  Vital signs stable.  No complaints at this time.  Discharge from clinic ambulatory in stable condition.  Alert and oriented X 3.  Follow up with [facility] as scheduled.  

## 2022-02-01 NOTE — Patient Instructions (Signed)
[facility]  Discharge Instructions: Thank you for choosing [facility] to provide your oncology and hematology care.  If you have a lab appointment with the Cancer Center, please come in thru the Main Entrance and check in at the main information desk.  Wear comfortable clothing and clothing appropriate for easy access to any Portacath or PICC line.   We strive to give you quality time with your provider. You may need to reschedule your appointment if you arrive late (15 or more minutes).  Arriving late affects you and other patients whose appointments are after yours.  Also, if you miss three or more appointments without notifying the office, you may be dismissed from the clinic at the provider's discretion.      For prescription refill requests, have your pharmacy contact our office and allow 72 hours for refills to be completed.    Today you received the following chemotherapy and/or immunotherapy agents Feraheme      To help prevent nausea and vomiting after your treatment, we encourage you to take your nausea medication as directed.  BELOW ARE SYMPTOMS THAT SHOULD BE REPORTED IMMEDIATELY: *FEVER GREATER THAN 100.4 F (38 C) OR HIGHER *CHILLS OR SWEATING *NAUSEA AND VOMITING THAT IS NOT CONTROLLED WITH YOUR NAUSEA MEDICATION *UNUSUAL SHORTNESS OF BREATH *UNUSUAL BRUISING OR BLEEDING *URINARY PROBLEMS (pain or burning when urinating, or frequent urination) *BOWEL PROBLEMS (unusual diarrhea, constipation, pain near the anus) TENDERNESS IN MOUTH AND THROAT WITH OR WITHOUT PRESENCE OF ULCERS (sore throat, sores in mouth, or a toothache) UNUSUAL RASH, SWELLING OR PAIN  UNUSUAL VAGINAL DISCHARGE OR ITCHING   Items with * indicate a potential emergency and should be followed up as soon as possible or go to the Emergency Department if any problems should occur.  Please show the CHEMOTHERAPY ALERT CARD or IMMUNOTHERAPY ALERT CARD at check-in to the Emergency  Department and triage nurse.  Should you have questions after your visit or need to cancel or reschedule your appointment, please contact [facility] [phone]  and follow the prompts.  Office hours are 8:00 a.m. to 4:30 p.m. Monday - Friday. Please note that voicemails left after 4:00 p.m. may not be returned until the following business day.  We are closed weekends and major holidays. You have access to a nurse at all times for urgent questions. Please call the main number to the clinic [phone] and follow the prompts.  For any non-urgent questions, you may also contact your provider using MyChart. We now offer e-Visits for anyone 18 and older to request care online for non-urgent symptoms. For details visit [email or web address].   Also download the MyChart app! Go to the app store, search "MyChart", open the app, select , and log in with your MyChart username and password.  Masks are optional in the cancer centers. If you would like for your care team to wear a mask while they are taking care of you, please let them know. For doctor visits, patients may have with them one support person who is at least 85 years old. At this time, visitors are not allowed in the infusion area.  

## 2022-02-07 NOTE — Progress Notes (Signed)
Sent message, via epic in basket, requesting order in epic from surgeon     02/07/22 1014  Preop Orders  Has preop orders? No  Name of staff/physician contacted for orders(Indicate phone or IB message) C. Dema Severin, MD.

## 2022-02-08 ENCOUNTER — Inpatient Hospital Stay (HOSPITAL_COMMUNITY): Payer: Medicare Other

## 2022-02-08 VITALS — BP 143/45 | HR 46 | Temp 96.2°F | Resp 18

## 2022-02-08 DIAGNOSIS — N183 Chronic kidney disease, stage 3 unspecified: Secondary | ICD-10-CM

## 2022-02-08 DIAGNOSIS — D509 Iron deficiency anemia, unspecified: Secondary | ICD-10-CM

## 2022-02-08 MED ORDER — SODIUM CHLORIDE 0.9 % IV SOLN
510.0000 mg | Freq: Once | INTRAVENOUS | Status: AC
  Administered 2022-02-08: 510 mg via INTRAVENOUS
  Filled 2022-02-08: qty 17

## 2022-02-08 MED ORDER — SODIUM CHLORIDE 0.9 % IV SOLN: Freq: Once | INTRAVENOUS | Status: AC

## 2022-02-08 NOTE — Patient Instructions (Signed)
[facility]  Discharge Instructions: Thank you for choosing [facility] to provide your oncology and hematology care.  If you have a lab appointment with the [facility], please come in thru the Main Entrance and check in at the main information desk.  Wear comfortable clothing and clothing appropriate for easy access to any Portacath or PICC line.   We strive to give you quality time with your provider. You may need to reschedule your appointment if you arrive late (15 or more minutes).  Arriving late affects you and other patients whose appointments are after yours.  Also, if you miss three or more appointments without notifying the office, you may be dismissed from the clinic at the provider's discretion.      For prescription refill requests, have your pharmacy contact our office and allow 72 hours for refills to be completed.    Today you received Feraheme IV iron    To help prevent nausea and vomiting after your treatment, we encourage you to take your nausea medication as directed.  BELOW ARE SYMPTOMS THAT SHOULD BE REPORTED IMMEDIATELY: *FEVER GREATER THAN 100.4 F (38 C) OR HIGHER *CHILLS OR SWEATING *NAUSEA AND VOMITING THAT IS NOT CONTROLLED WITH YOUR NAUSEA MEDICATION *UNUSUAL SHORTNESS OF BREATH *UNUSUAL BRUISING OR BLEEDING *URINARY PROBLEMS (pain or burning when urinating, or frequent urination) *BOWEL PROBLEMS (unusual diarrhea, constipation, pain near the anus) TENDERNESS IN MOUTH AND THROAT WITH OR WITHOUT PRESENCE OF ULCERS (sore throat, sores in mouth, or a toothache) UNUSUAL RASH, SWELLING OR PAIN  UNUSUAL VAGINAL DISCHARGE OR ITCHING   Items with * indicate a potential emergency and should be followed up as soon as possible or go to the Emergency Department if any problems should occur.  Please show the CHEMOTHERAPY ALERT CARD or IMMUNOTHERAPY ALERT CARD at check-in to the Emergency Department and triage nurse.  Should you have  questions after your visit or need to cancel or reschedule your appointment, please contact [facility] [phone]  and follow the prompts.  Office hours are 8:00 a.m. to 4:30 p.m. Monday - Friday. Please note that voicemails left after 4:00 p.m. may not be returned until the following business day.  We are closed weekends and major holidays. You have access to a nurse at all times for urgent questions. Please call the main number to the clinic [phone] and follow the prompts.  For any non-urgent questions, you may also contact your provider using MyChart. We now offer e-Visits for anyone 11 and older to request care online for non-urgent symptoms. For details visit [email or web address].   Also download the MyChart app! Go to the app store, search "MyChart", open the app, select [facility], and log in with your MyChart username and password.  Masks are optional in the cancer centers. If you would like for your care team to wear a mask while they are taking care of you, please let them know. For doctor visits, patients may have with them one support person who is at least 85 years old. At this time, visitors are not allowed in the infusion area.

## 2022-02-08 NOTE — Progress Notes (Signed)
Pt presents today for Feraheme IV iron infusion per provider's order. Vital signs stable and pt voiced no new complaints at this time.  Peripheral IV started with good blood return pre and post infusion.  Feraheme given today per MD orders. Tolerated infusion without adverse affects. Vital signs stable. No complaints at this time. Discharged from clinic ambulatory in stable condition. Alert and oriented x 3. F/U with [facility] as scheduled.    

## 2022-02-10 ENCOUNTER — Other Ambulatory Visit (HOSPITAL_COMMUNITY): Payer: Self-pay

## 2022-02-13 NOTE — Patient Instructions (Signed)
DUE TO COVID-19 ONLY TWO VISITORS  (aged 85 and older)  ARE ALLOWED TO COME WITH YOU AND STAY IN THE WAITING ROOM ONLY DURING PRE OP AND PROCEDURE.   **NO VISITORS ARE ALLOWED IN THE SHORT STAY AREA OR RECOVERY ROOM!!**  IF YOU WILL BE ADMITTED INTO THE HOSPITAL YOU ARE ALLOWED ONLY FOUR SUPPORT PEOPLE DURING VISITATION HOURS ONLY (7 AM -8PM)   The support person(s) must pass our screening, gel in and out, and wear a mask at all times, including in the patient's room. Patients must also wear a mask when staff or their support person are in the room. Visitors GUEST BADGE MUST BE WORN VISIBLY  One adult visitor may remain with you overnight and MUST be in the room by 8 P.M.     Your procedure is scheduled on: 02/17/22   Report to [facility] Main Entrance    Report to admitting at : 10:45 AM   Call this number if you have problems the morning of surgery [phone]   Clear liquids starting day before surgery until : 10:00 AM DAY OF SURGERY  Water Black Coffee (sugar ok, NO MILK/CREAM OR CREAMERS)  Tea (sugar ok, NO MILK/CREAM OR CREAMERS) regular and decaf                             Plain Jell-O (NO RED)                                           Fruit ices (not with fruit pulp, NO RED)                                     Popsicles (NO RED)                                                                  Juice: apple, WHITE grape, WHITE cranberry Sports drinks like Gatorade (NO RED) Clear broth(vegetable,chicken,beef)  FOLLOW BOWEL PREP AND ANY ADDITIONAL PRE OP INSTRUCTIONS YOU RECEIVED FROM YOUR SURGEON'S OFFICE!!!   Oral Hygiene is also important to reduce your risk of infection.                                    Remember - BRUSH YOUR TEETH THE MORNING OF SURGERY WITH YOUR REGULAR TOOTHPASTE   Do NOT smoke after Midnight   Take these medicines the morning of surgery with A SIP OF WATER: loratadine,apresoline,carvedilol,amlodipine,omeprazole.Use inhalers and eye drops  as usual. How to Manage Your Diabetes Before and After Surgery  Why is it important to control my blood sugar before and after surgery? Improving blood sugar levels before and after surgery helps healing and can limit problems. A way of improving blood sugar control is eating a healthy diet by:  Eating less sugar and carbohydrates  Increasing activity/exercise  Talking with your doctor about reaching your blood sugar goals High blood sugars (greater than 180 mg/dL) can raise your risk of  infections and slow your recovery, so you will need to focus on controlling your diabetes during the weeks before surgery. Make sure that the doctor who takes care of your diabetes knows about your planned surgery including the date and location.  How do I manage my blood sugar before surgery? Check your blood sugar at least 4 times a day, starting 2 days before surgery, to make sure that the level is not too high or low. Check your blood sugar the morning of your surgery when you wake up and every 2 hours until you get to the Short Stay unit. If your blood sugar is less than 70 mg/dL, you will need to treat for low blood sugar: Do not take insulin. Treat a low blood sugar (less than 70 mg/dL) with  cup of clear juice (cranberry or apple), 4 glucose tablets, OR glucose gel. Recheck blood sugar in 15 minutes after treatment (to make sure it is greater than 70 mg/dL). If your blood sugar is not greater than 70 mg/dL on recheck, call [phone] for further instructions. Report your blood sugar to the short stay nurse when you get to Short Stay.  If you are admitted to the hospital after surgery: Your blood sugar will be checked by the staff and you will probably be given insulin after surgery (instead of oral diabetes medicines) to make sure you have good blood sugar levels. The goal for blood sugar control after surgery is 80-180 mg/dL.   WHAT DO I DO ABOUT MY DIABETES MEDICATION?  Do not take oral  diabetes medicines (pills) the morning of surgery.  THE DAY BEFORE SURGERY, take trajenta as usual.      THE MORNING OF SURGERY, DO NOT TAKE ANY ORAL DIABETIC MEDICATIONS DAY OF YOUR SURGERY  Bring CPAP mask and tubing day of surgery.                              You may not have any metal on your body including hair pins, jewelry, and body piercing             Do not wear make-up, lotions, powders, perfumes/cologne, or deodorant  Do not wear nail polish including gel and S&S, artificial/acrylic nails, or any other type of covering on natural nails including finger and toenails. If you have artificial nails, gel coating, etc. that needs to be removed by a nail salon please have this removed prior to surgery or surgery may need to be canceled/ delayed if the surgeon/ anesthesia feels like they are unable to be safely monitored.   Do not shave  48 hours prior to surgery.               Men may shave face and neck.   Do not bring valuables to the hospital. [place].   Contacts, dentures or bridgework may not be worn into surgery.   Bring small overnight bag day of surgery.   DO NOT [place]. PHARMACY WILL DISPENSE MEDICATIONS LISTED ON YOUR MEDICATION LIST TO YOU DURING YOUR ADMISSION [place]!    Patients discharged on the day of surgery will not be allowed to drive home.  Someone NEEDS to stay with you for the first 24 hours after anesthesia.   Special Instructions: Bring a copy of your healthcare power  of attorney and living will documents         the day of surgery if you haven't scanned them before.              Please read over the following fact sheets you were given: IF YOU HAVE QUESTIONS ABOUT YOUR PRE-OP INSTRUCTIONS PLEASE CALL [phone]     [facility] - Preparing for Surgery Before surgery, you can play an important role.  Because skin is not sterile, your skin needs to be as  free of germs as possible.  You can reduce the number of germs on your skin by washing with CHG (chlorahexidine gluconate) soap before surgery.  CHG is an antiseptic cleaner which kills germs and bonds with the skin to continue killing germs even after washing. Please DO NOT use if you have an allergy to CHG or antibacterial soaps.  If your skin becomes reddened/irritated stop using the CHG and inform your nurse when you arrive at Short Stay. Do not shave (including legs and underarms) for at least 48 hours prior to the first CHG shower.  You may shave your face/neck. Please follow these instructions carefully:  1.  Shower with CHG Soap the night before surgery and the  morning of Surgery.  2.  If you choose to wash your hair, wash your hair first as usual with your  normal  shampoo.  3.  After you shampoo, rinse your hair and body thoroughly to remove the  shampoo.                           4.  Use CHG as you would any other liquid soap.  You can apply chg directly  to the skin and wash                       Gently with a scrungie or clean washcloth.  5.  Apply the CHG Soap to your body ONLY FROM THE NECK DOWN.   Do not use on face/ open                           Wound or open sores. Avoid contact with eyes, ears mouth and genitals (private parts).                       Wash face,  Genitals (private parts) with your normal soap.             6.  Wash thoroughly, paying special attention to the area where your surgery  will be performed.  7.  Thoroughly rinse your body with warm water from the neck down.  8.  DO NOT shower/wash with your normal soap after using and rinsing off  the CHG Soap.                9.  Pat yourself dry with a clean towel.            10.  Wear clean pajamas.            11.  Place clean sheets on your bed the night of your first shower and do not  sleep with pets. Day of Surgery : Do not apply any lotions/deodorants the morning of surgery.  Please wear clean clothes to the  hospital/surgery center.  FAILURE TO FOLLOW THESE INSTRUCTIONS MAY RESULT IN THE CANCELLATION OF YOUR SURGERY PATIENT SIGNATURE_________________________________  NURSE SIGNATURE__________________________________  ________________________________________________________________________

## 2022-02-13 NOTE — Progress Notes (Signed)
Pt. Need orders for surgery.

## 2022-02-14 ENCOUNTER — Encounter (HOSPITAL_COMMUNITY)
Admission: RE | Admit: 2022-02-14 | Discharge: 2022-02-14 | Disposition: A | Discharge status: Home / Self Care | Payer: Medicare Other | Source: Ambulatory Visit | Attending: Surgery | Admitting: Surgery

## 2022-02-14 ENCOUNTER — Other Ambulatory Visit: Payer: Self-pay

## 2022-02-14 ENCOUNTER — Encounter (HOSPITAL_COMMUNITY): Payer: Self-pay

## 2022-02-14 VITALS — BP 121/82 | HR 50 | Temp 97.8°F | Ht 68.9 in | Wt 200.0 lb

## 2022-02-14 DIAGNOSIS — Z01812 Encounter for preprocedural laboratory examination: Secondary | ICD-10-CM | POA: Insufficient documentation

## 2022-02-14 DIAGNOSIS — I10 Essential (primary) hypertension: Secondary | ICD-10-CM | POA: Insufficient documentation

## 2022-02-14 DIAGNOSIS — E119 Type 2 diabetes mellitus without complications: Secondary | ICD-10-CM | POA: Insufficient documentation

## 2022-02-14 HISTORY — DX: Dyspnea, unspecified: R06.00

## 2022-02-14 HISTORY — DX: Anemia, unspecified: D64.9

## 2022-02-14 LAB — BASIC METABOLIC PANEL
Anion gap: 8 (ref 5–15)
BUN: 35 mg/dL — ABNORMAL HIGH (ref 8–23)
CO2: 22 mmol/L (ref 22–32)
Calcium: 8.8 mg/dL — ABNORMAL LOW (ref 8.9–10.3)
Chloride: 110 mmol/L (ref 98–111)
Creatinine, Ser: 1.41 mg/dL — ABNORMAL HIGH (ref 0.61–1.24)
GFR, Estimated: 49 mL/min — ABNORMAL LOW (ref >60)
Glucose, Bld: 104 mg/dL — ABNORMAL HIGH (ref 70–99)
Potassium: 4.4 mmol/L (ref 3.5–5.1)
Sodium: 140 mmol/L (ref 135–145)

## 2022-02-14 LAB — CBC
HCT: 24.4 % — ABNORMAL LOW (ref 39.0–52.0)
Hemoglobin: 7.9 g/dL — ABNORMAL LOW (ref 13.0–17.0)
MCH: 33.5 pg (ref 26.0–34.0)
MCHC: 32.4 g/dL (ref 30.0–36.0)
MCV: 103.4 fL — ABNORMAL HIGH (ref 80.0–100.0)
Platelets: 217 10*3/uL (ref 150–400)
RBC: 2.36 MIL/uL — ABNORMAL LOW (ref 4.22–5.81)
RDW: 15.3 % (ref 11.5–15.5)
WBC: 7.4 10*3/uL (ref 4.0–10.5)
nRBC: 0 % (ref 0.0–0.2)

## 2022-02-14 LAB — HEMOGLOBIN A1C
Hgb A1c MFr Bld: 4.7 % — ABNORMAL LOW (ref 4.8–5.6)
Mean Plasma Glucose: 88.19 mg/dL

## 2022-02-14 LAB — GLUCOSE, CAPILLARY: Glucose-Capillary: 117 mg/dL — ABNORMAL HIGH (ref 70–99)

## 2022-02-14 NOTE — Progress Notes (Signed)
For Short Stay: [place] appointment date: Date of COVID positive in last 31 days:  Bowel Prep reminder:   For Anesthesia: PCP - Dr. Margaretha Sheffield Cardiologist -   Chest x-ray - 01/13/22 EKG - 12/05/21  Stress Test -  ECHO -  Cardiac Cath -  Pacemaker/ICD device last checked: Pacemaker orders received: Device Rep notified:  Spinal Cord Stimulator:  Sleep Study - Yes CPAP - NO  Fasting Blood Sugar - N/A Checks Blood Sugar __0___ times a day Date and result of last Hgb A1c-  Blood Thinner Instructions: Eliquis: It've been on hold since: 02/12/22 as per hematologist instructions. Aspirin Instructions: Last Dose:  Activity level: Can go up a flight of stairs and activities of daily living without stopping and without chest pain and/or shortness of breath   Able to exercise without chest pain and/or shortness of breath   Unable to go up a flight of stairs without chest pain and/or shortness of breath     Anesthesia review: Hx: DIA,HTN,DVT,CKD III,Murmur. Pt. Gets SOB on exertion after he was d/c from hospital.  Patient denies shortness of breath, fever, cough and chest pain at PAT appointment   Patient verbalized understanding of instructions that were given to them at the PAT appointment. Patient was also instructed that they will need to review over the PAT instructions again at home before surgery.

## 2022-02-15 ENCOUNTER — Ambulatory Visit: Payer: Self-pay | Admitting: Surgery

## 2022-02-15 DIAGNOSIS — Z01818 Encounter for other preprocedural examination: Secondary | ICD-10-CM

## 2022-02-15 NOTE — Progress Notes (Signed)
Lab. Results: Hemoglobin: 7.9 HCT: 24.4

## 2022-02-17 ENCOUNTER — Inpatient Hospital Stay (HOSPITAL_COMMUNITY): Payer: Medicare Other | Admitting: Physician Assistant

## 2022-02-17 ENCOUNTER — Inpatient Hospital Stay (HOSPITAL_COMMUNITY)
Admission: RE | Admit: 2022-02-17 | Discharge: 2022-02-21 | DRG: 330 | Disposition: A | Discharge status: Home / Self Care | Payer: Medicare Other | Attending: Surgery | Admitting: Surgery

## 2022-02-17 ENCOUNTER — Other Ambulatory Visit: Payer: Self-pay

## 2022-02-17 ENCOUNTER — Encounter (HOSPITAL_COMMUNITY)
Admission: RE | Disposition: A | Discharge status: Home / Self Care | Payer: Self-pay | Source: Home / Self Care | Attending: Surgery

## 2022-02-17 ENCOUNTER — Inpatient Hospital Stay (HOSPITAL_COMMUNITY): Payer: Medicare Other | Admitting: Anesthesiology

## 2022-02-17 ENCOUNTER — Encounter (HOSPITAL_COMMUNITY): Payer: Self-pay | Admitting: Surgery

## 2022-02-17 DIAGNOSIS — Z86718 Personal history of other venous thrombosis and embolism: Secondary | ICD-10-CM | POA: Diagnosis not present

## 2022-02-17 DIAGNOSIS — Z87891 Personal history of nicotine dependence: Secondary | ICD-10-CM

## 2022-02-17 DIAGNOSIS — K219 Gastro-esophageal reflux disease without esophagitis: Secondary | ICD-10-CM | POA: Diagnosis present

## 2022-02-17 DIAGNOSIS — N183 Chronic kidney disease, stage 3 unspecified: Secondary | ICD-10-CM | POA: Diagnosis present

## 2022-02-17 DIAGNOSIS — Z79899 Other long term (current) drug therapy: Secondary | ICD-10-CM

## 2022-02-17 DIAGNOSIS — K660 Peritoneal adhesions (postprocedural) (postinfection): Secondary | ICD-10-CM | POA: Diagnosis present

## 2022-02-17 DIAGNOSIS — Z01818 Encounter for other preprocedural examination: Secondary | ICD-10-CM

## 2022-02-17 DIAGNOSIS — E1122 Type 2 diabetes mellitus with diabetic chronic kidney disease: Secondary | ICD-10-CM | POA: Diagnosis present

## 2022-02-17 DIAGNOSIS — D682 Hereditary deficiency of other clotting factors: Secondary | ICD-10-CM | POA: Diagnosis present

## 2022-02-17 DIAGNOSIS — E1151 Type 2 diabetes mellitus with diabetic peripheral angiopathy without gangrene: Secondary | ICD-10-CM | POA: Diagnosis present

## 2022-02-17 DIAGNOSIS — D630 Anemia in neoplastic disease: Secondary | ICD-10-CM | POA: Diagnosis not present

## 2022-02-17 DIAGNOSIS — D509 Iron deficiency anemia, unspecified: Secondary | ICD-10-CM | POA: Diagnosis present

## 2022-02-17 DIAGNOSIS — E785 Hyperlipidemia, unspecified: Secondary | ICD-10-CM | POA: Diagnosis present

## 2022-02-17 DIAGNOSIS — D6851 Activated protein C resistance: Secondary | ICD-10-CM | POA: Diagnosis present

## 2022-02-17 DIAGNOSIS — I10 Essential (primary) hypertension: Secondary | ICD-10-CM

## 2022-02-17 DIAGNOSIS — C186 Malignant neoplasm of descending colon: Secondary | ICD-10-CM

## 2022-02-17 DIAGNOSIS — I129 Hypertensive chronic kidney disease with stage 1 through stage 4 chronic kidney disease, or unspecified chronic kidney disease: Secondary | ICD-10-CM | POA: Diagnosis present

## 2022-02-17 DIAGNOSIS — Z9049 Acquired absence of other specified parts of digestive tract: Secondary | ICD-10-CM

## 2022-02-17 DIAGNOSIS — I700 Atherosclerosis of aorta: Secondary | ICD-10-CM | POA: Diagnosis present

## 2022-02-17 LAB — CBC WITH DIFFERENTIAL/PLATELET
Abs Immature Granulocytes: 0.02 10*3/uL (ref 0.00–0.07)
Basophils Absolute: 0 10*3/uL (ref 0.0–0.1)
Basophils Relative: 0 %
Eosinophils Absolute: 0 10*3/uL (ref 0.0–0.5)
Eosinophils Relative: 1 %
HCT: 26.5 % — ABNORMAL LOW (ref 39.0–52.0)
Hemoglobin: 8.5 g/dL — ABNORMAL LOW (ref 13.0–17.0)
Immature Granulocytes: 0 %
Lymphocytes Relative: 9 %
Lymphs Abs: 0.7 10*3/uL (ref 0.7–4.0)
MCH: 33.1 pg (ref 26.0–34.0)
MCHC: 32.1 g/dL (ref 30.0–36.0)
MCV: 103.1 fL — ABNORMAL HIGH (ref 80.0–100.0)
Monocytes Absolute: 0.5 10*3/uL (ref 0.1–1.0)
Monocytes Relative: 7 %
Neutro Abs: 6 10*3/uL (ref 1.7–7.7)
Neutrophils Relative %: 83 %
Platelets: 240 10*3/uL (ref 150–400)
RBC: 2.57 MIL/uL — ABNORMAL LOW (ref 4.22–5.81)
RDW: 15.6 % — ABNORMAL HIGH (ref 11.5–15.5)
WBC: 7.3 10*3/uL (ref 4.0–10.5)
nRBC: 0 % (ref 0.0–0.2)

## 2022-02-17 LAB — CBC
HCT: 27.8 % — ABNORMAL LOW (ref 39.0–52.0)
Hemoglobin: 8.9 g/dL — ABNORMAL LOW (ref 13.0–17.0)
MCH: 31.9 pg (ref 26.0–34.0)
MCHC: 32 g/dL (ref 30.0–36.0)
MCV: 99.6 fL (ref 80.0–100.0)
Platelets: 222 10*3/uL (ref 150–400)
RBC: 2.79 MIL/uL — ABNORMAL LOW (ref 4.22–5.81)
RDW: 20.2 % — ABNORMAL HIGH (ref 11.5–15.5)
WBC: 12.7 10*3/uL — ABNORMAL HIGH (ref 4.0–10.5)
nRBC: 0 % (ref 0.0–0.2)

## 2022-02-17 LAB — COMPREHENSIVE METABOLIC PANEL
ALT: 18 U/L (ref 0–44)
AST: 17 U/L (ref 15–41)
Albumin: 3.6 g/dL (ref 3.5–5.0)
Alkaline Phosphatase: 79 U/L (ref 38–126)
Anion gap: 10 (ref 5–15)
BUN: 26 mg/dL — ABNORMAL HIGH (ref 8–23)
CO2: 19 mmol/L — ABNORMAL LOW (ref 22–32)
Calcium: 8.9 mg/dL (ref 8.9–10.3)
Chloride: 112 mmol/L — ABNORMAL HIGH (ref 98–111)
Creatinine, Ser: 1.7 mg/dL — ABNORMAL HIGH (ref 0.61–1.24)
GFR, Estimated: 39 mL/min — ABNORMAL LOW (ref >60)
Glucose, Bld: 124 mg/dL — ABNORMAL HIGH (ref 70–99)
Potassium: 4 mmol/L (ref 3.5–5.1)
Sodium: 141 mmol/L (ref 135–145)
Total Bilirubin: 1 mg/dL (ref 0.3–1.2)
Total Protein: 6.4 g/dL — ABNORMAL LOW (ref 6.5–8.1)

## 2022-02-17 LAB — GLUCOSE, CAPILLARY
Glucose-Capillary: 138 mg/dL — ABNORMAL HIGH (ref 70–99)
Glucose-Capillary: 168 mg/dL — ABNORMAL HIGH (ref 70–99)

## 2022-02-17 LAB — PREPARE RBC (CROSSMATCH)

## 2022-02-17 LAB — CREATININE, SERUM
Creatinine, Ser: 1.71 mg/dL — ABNORMAL HIGH (ref 0.61–1.24)
GFR, Estimated: 39 mL/min — ABNORMAL LOW (ref >60)

## 2022-02-17 LAB — ABO/RH: ABO/RH(D): O POS

## 2022-02-17 SURGERY — COLECTOMY, PARTIAL, ROBOT-ASSISTED, LAPAROSCOPIC
Anesthesia: General | Laterality: Left

## 2022-02-17 MED ORDER — HEPARIN SODIUM (PORCINE) 5000 UNIT/ML IJ SOLN
5000.0000 [IU] | Freq: Once | INTRAMUSCULAR | Status: AC
  Administered 2022-02-17: 5000 [IU] via SUBCUTANEOUS
  Filled 2022-02-17: qty 1

## 2022-02-17 MED ORDER — GLYCOPYRROLATE 0.2 MG/ML IJ SOLN
INTRAMUSCULAR | Status: AC
  Filled 2022-02-17: qty 1

## 2022-02-17 MED ORDER — LORAZEPAM 0.5 MG PO TABS: 0.5000 mg | ORAL_TABLET | Freq: Two times a day (BID) | ORAL | Status: DC | PRN

## 2022-02-17 MED ORDER — ONDANSETRON HCL 4 MG/2ML IJ SOLN
4.0000 mg | Freq: Four times a day (QID) | INTRAMUSCULAR | Status: DC | PRN
  Administered 2022-02-19: 4 mg via INTRAVENOUS
  Filled 2022-02-17: qty 2

## 2022-02-17 MED ORDER — ONDANSETRON HCL 4 MG/2ML IJ SOLN: 4.0000 mg | Freq: Once | INTRAMUSCULAR | Status: DC | PRN

## 2022-02-17 MED ORDER — FENTANYL CITRATE (PF) 100 MCG/2ML IJ SOLN
INTRAMUSCULAR | Status: AC
  Filled 2022-02-17: qty 2

## 2022-02-17 MED ORDER — ROCURONIUM BROMIDE 10 MG/ML (PF) SYRINGE
PREFILLED_SYRINGE | INTRAVENOUS | Status: DC | PRN
  Administered 2022-02-17: 80 mg via INTRAVENOUS
  Administered 2022-02-17 (×2): 20 mg via INTRAVENOUS

## 2022-02-17 MED ORDER — PROPOFOL 10 MG/ML IV BOLUS
INTRAVENOUS | Status: DC | PRN
  Administered 2022-02-17: 100 mg via INTRAVENOUS

## 2022-02-17 MED ORDER — ENSURE PRE-SURGERY PO LIQD
296.0000 mL | Freq: Once | ORAL | Status: DC
  Filled 2022-02-17: qty 296

## 2022-02-17 MED ORDER — BUPIVACAINE-EPINEPHRINE (PF) 0.25% -1:200000 IJ SOLN
INTRAMUSCULAR | Status: DC | PRN
  Administered 2022-02-17: 30 mL

## 2022-02-17 MED ORDER — HYDRALAZINE HCL 50 MG PO TABS
50.0000 mg | ORAL_TABLET | Freq: Two times a day (BID) | ORAL | Status: DC
  Administered 2022-02-17 – 2022-02-21 (×8): 50 mg via ORAL
  Filled 2022-02-17 (×8): qty 1

## 2022-02-17 MED ORDER — AMLODIPINE BESYLATE 5 MG PO TABS
5.0000 mg | ORAL_TABLET | Freq: Two times a day (BID) | ORAL | Status: DC
  Administered 2022-02-17 – 2022-02-21 (×8): 5 mg via ORAL
  Filled 2022-02-17 (×8): qty 1

## 2022-02-17 MED ORDER — SPY AGENT GREEN - (INDOCYANINE FOR INJECTION)
INTRAMUSCULAR | Status: DC | PRN
  Administered 2022-02-17: 2 mL via INTRAVENOUS

## 2022-02-17 MED ORDER — IBUPROFEN 400 MG PO TABS: 600.0000 mg | ORAL_TABLET | Freq: Four times a day (QID) | ORAL | Status: DC | PRN

## 2022-02-17 MED ORDER — SIMETHICONE 80 MG PO CHEW: 40.0000 mg | CHEWABLE_TABLET | Freq: Four times a day (QID) | ORAL | Status: DC | PRN

## 2022-02-17 MED ORDER — IRBESARTAN 150 MG PO TABS
300.0000 mg | ORAL_TABLET | Freq: Every evening | ORAL | Status: DC
  Administered 2022-02-17 – 2022-02-20 (×4): 300 mg via ORAL
  Filled 2022-02-17 (×4): qty 2

## 2022-02-17 MED ORDER — METRONIDAZOLE 500 MG PO TABS: 1000.0000 mg | ORAL_TABLET | ORAL | Status: DC

## 2022-02-17 MED ORDER — LACTATED RINGERS IV SOLN: INTRAVENOUS | Status: DC

## 2022-02-17 MED ORDER — LIDOCAINE 2% (20 MG/ML) 5 ML SYRINGE
INTRAMUSCULAR | Status: DC | PRN
  Administered 2022-02-17: 100 mg via INTRAVENOUS

## 2022-02-17 MED ORDER — LIDOCAINE HCL (PF) 2 % IJ SOLN
INTRAMUSCULAR | Status: AC
  Filled 2022-02-17: qty 5

## 2022-02-17 MED ORDER — POLYETHYLENE GLYCOL 3350 17 GM/SCOOP PO POWD: 1.0000 | Freq: Once | ORAL | Status: DC

## 2022-02-17 MED ORDER — FOLIC ACID 1 MG PO TABS
1.0000 mg | ORAL_TABLET | Freq: Every evening | ORAL | Status: DC
  Administered 2022-02-18 – 2022-02-20 (×3): 1 mg via ORAL
  Filled 2022-02-17 (×3): qty 1

## 2022-02-17 MED ORDER — HYDROMORPHONE HCL 1 MG/ML IJ SOLN
0.5000 mg | INTRAMUSCULAR | Status: DC | PRN
  Administered 2022-02-18 – 2022-02-19 (×2): 0.5 mg via INTRAVENOUS
  Filled 2022-02-17 (×3): qty 0.5

## 2022-02-17 MED ORDER — PANTOPRAZOLE SODIUM 40 MG PO TBEC
40.0000 mg | DELAYED_RELEASE_TABLET | Freq: Every day | ORAL | Status: DC
  Administered 2022-02-18 – 2022-02-21 (×4): 40 mg via ORAL
  Filled 2022-02-17 (×4): qty 1

## 2022-02-17 MED ORDER — PHENYLEPHRINE HCL (PRESSORS) 10 MG/ML IV SOLN
INTRAVENOUS | Status: AC
  Filled 2022-02-17: qty 1

## 2022-02-17 MED ORDER — ACETAMINOPHEN 10 MG/ML IV SOLN: 1000.0000 mg | Freq: Once | INTRAVENOUS | Status: DC | PRN

## 2022-02-17 MED ORDER — 0.9 % SODIUM CHLORIDE (POUR BTL) OPTIME
TOPICAL | Status: DC | PRN
  Administered 2022-02-17: 2000 mL

## 2022-02-17 MED ORDER — CHLORHEXIDINE GLUCONATE 0.12 % MT SOLN
15.0000 mL | Freq: Once | OROMUCOSAL | Status: AC
  Administered 2022-02-17: 15 mL via OROMUCOSAL

## 2022-02-17 MED ORDER — DIPHENHYDRAMINE HCL 50 MG/ML IJ SOLN: 12.5000 mg | Freq: Four times a day (QID) | INTRAMUSCULAR | Status: DC | PRN

## 2022-02-17 MED ORDER — FERROUS SULFATE 325 (65 FE) MG PO TABS
325.0000 mg | ORAL_TABLET | Freq: Every day | ORAL | Status: DC
  Administered 2022-02-18 – 2022-02-21 (×4): 325 mg via ORAL
  Filled 2022-02-17 (×4): qty 1

## 2022-02-17 MED ORDER — BUPIVACAINE LIPOSOME 1.3 % IJ SUSP
20.0000 mL | Freq: Once | INTRAMUSCULAR | Status: AC
  Administered 2022-02-17: 20 mL

## 2022-02-17 MED ORDER — CHLORHEXIDINE GLUCONATE CLOTH 2 % EX PADS: 6.0000 | MEDICATED_PAD | Freq: Once | CUTANEOUS | Status: DC

## 2022-02-17 MED ORDER — VITAMIN D 25 MCG (1000 UNIT) PO TABS
5000.0000 [IU] | ORAL_TABLET | Freq: Every evening | ORAL | Status: DC
  Administered 2022-02-18 – 2022-02-20 (×3): 5000 [IU] via ORAL
  Filled 2022-02-17 (×3): qty 5

## 2022-02-17 MED ORDER — ONDANSETRON HCL 4 MG/2ML IJ SOLN
INTRAMUSCULAR | Status: AC
  Filled 2022-02-17: qty 2

## 2022-02-17 MED ORDER — EPHEDRINE SULFATE-NACL 50-0.9 MG/10ML-% IV SOSY
PREFILLED_SYRINGE | INTRAVENOUS | Status: DC | PRN
  Administered 2022-02-17 (×4): 10 mg via INTRAVENOUS
  Administered 2022-02-17: 5 mg via INTRAVENOUS

## 2022-02-17 MED ORDER — ATORVASTATIN CALCIUM 40 MG PO TABS
40.0000 mg | ORAL_TABLET | Freq: Every evening | ORAL | Status: DC
  Administered 2022-02-17 – 2022-02-20 (×4): 40 mg via ORAL
  Filled 2022-02-17 (×4): qty 1

## 2022-02-17 MED ORDER — GLYCOPYRROLATE 0.2 MG/ML IJ SOLN
INTRAMUSCULAR | Status: DC | PRN
  Administered 2022-02-17: .2 mg via INTRAVENOUS

## 2022-02-17 MED ORDER — ALBUTEROL SULFATE (2.5 MG/3ML) 0.083% IN NEBU: 3.0000 mL | INHALATION_SOLUTION | Freq: Four times a day (QID) | RESPIRATORY_TRACT | Status: DC | PRN

## 2022-02-17 MED ORDER — LATANOPROST 0.005 % OP SOLN
1.0000 [drp] | Freq: Every day | OPHTHALMIC | Status: DC
Start: 2022-02-17 — End: 2022-02-21
  Administered 2022-02-17 – 2022-02-20 (×4): 1 [drp] via OPHTHALMIC
  Filled 2022-02-17: qty 2.5

## 2022-02-17 MED ORDER — AMITRIPTYLINE HCL 10 MG PO TABS
10.0000 mg | ORAL_TABLET | Freq: Every evening | ORAL | Status: DC
  Administered 2022-02-17 – 2022-02-20 (×4): 10 mg via ORAL
  Filled 2022-02-17 (×4): qty 1

## 2022-02-17 MED ORDER — DEXAMETHASONE SODIUM PHOSPHATE 10 MG/ML IJ SOLN
INTRAMUSCULAR | Status: DC | PRN
  Administered 2022-02-17: 10 mg via INTRAVENOUS

## 2022-02-17 MED ORDER — SUGAMMADEX SODIUM 200 MG/2ML IV SOLN
INTRAVENOUS | Status: DC | PRN
  Administered 2022-02-17: 200 mg via INTRAVENOUS

## 2022-02-17 MED ORDER — DIPHENHYDRAMINE HCL 12.5 MG/5ML PO ELIX: 12.5000 mg | ORAL_SOLUTION | Freq: Four times a day (QID) | ORAL | Status: DC | PRN

## 2022-02-17 MED ORDER — ACETAMINOPHEN 500 MG PO TABS
1000.0000 mg | ORAL_TABLET | Freq: Four times a day (QID) | ORAL | Status: DC
  Administered 2022-02-17 – 2022-02-21 (×14): 1000 mg via ORAL
  Filled 2022-02-17 (×15): qty 2

## 2022-02-17 MED ORDER — PHENYLEPHRINE HCL-NACL 20-0.9 MG/250ML-% IV SOLN
INTRAVENOUS | Status: DC | PRN
  Administered 2022-02-17: 40 ug/min via INTRAVENOUS
  Administered 2022-02-17: 50 ug/min via INTRAVENOUS

## 2022-02-17 MED ORDER — ORAL CARE MOUTH RINSE: 15.0000 mL | Freq: Once | OROMUCOSAL | Status: AC

## 2022-02-17 MED ORDER — ONDANSETRON HCL 4 MG PO TABS: 4.0000 mg | ORAL_TABLET | Freq: Four times a day (QID) | ORAL | Status: DC | PRN

## 2022-02-17 MED ORDER — EPHEDRINE 5 MG/ML INJ
INTRAVENOUS | Status: AC
  Filled 2022-02-17: qty 5

## 2022-02-17 MED ORDER — ONDANSETRON HCL 4 MG/2ML IJ SOLN
INTRAMUSCULAR | Status: DC | PRN
  Administered 2022-02-17: 4 mg via INTRAVENOUS

## 2022-02-17 MED ORDER — LIDOCAINE HCL (PF) 2 % IJ SOLN
INTRAMUSCULAR | Status: DC | PRN
  Administered 2022-02-17: 1.5 mg/kg/h via INTRADERMAL

## 2022-02-17 MED ORDER — LIDOCAINE HCL 2 % IJ SOLN
INTRAMUSCULAR | Status: AC
Start: 2022-02-17 — End: ?
  Filled 2022-02-17: qty 20

## 2022-02-17 MED ORDER — NEOMYCIN SULFATE 500 MG PO TABS: 1000.0000 mg | ORAL_TABLET | ORAL | Status: DC

## 2022-02-17 MED ORDER — HYDROMORPHONE HCL 1 MG/ML IJ SOLN
0.2500 mg | INTRAMUSCULAR | Status: DC | PRN
  Administered 2022-02-17: 0.5 mg via INTRAVENOUS

## 2022-02-17 MED ORDER — HYDROMORPHONE HCL 1 MG/ML IJ SOLN
INTRAMUSCULAR | Status: AC
  Filled 2022-02-17: qty 1

## 2022-02-17 MED ORDER — TRAMADOL HCL 50 MG PO TABS: 50.0000 mg | ORAL_TABLET | Freq: Four times a day (QID) | ORAL | Status: DC | PRN

## 2022-02-17 MED ORDER — ALVIMOPAN 12 MG PO CAPS
12.0000 mg | ORAL_CAPSULE | ORAL | Status: AC
  Administered 2022-02-17: 12 mg via ORAL
  Filled 2022-02-17: qty 1

## 2022-02-17 MED ORDER — ASCORBIC ACID 500 MG PO TABS
1000.0000 mg | ORAL_TABLET | Freq: Every day | ORAL | Status: DC
  Administered 2022-02-18 – 2022-02-21 (×4): 1000 mg via ORAL
  Filled 2022-02-17 (×4): qty 2

## 2022-02-17 MED ORDER — DEXAMETHASONE SODIUM PHOSPHATE 10 MG/ML IJ SOLN
INTRAMUSCULAR | Status: AC
  Filled 2022-02-17: qty 1

## 2022-02-17 MED ORDER — BUPIVACAINE-EPINEPHRINE (PF) 0.25% -1:200000 IJ SOLN
INTRAMUSCULAR | Status: AC
  Filled 2022-02-17: qty 30

## 2022-02-17 MED ORDER — BISACODYL 5 MG PO TBEC: 20.0000 mg | DELAYED_RELEASE_TABLET | Freq: Once | ORAL | Status: DC

## 2022-02-17 MED ORDER — LACTATED RINGERS IR SOLN
Status: DC | PRN
  Administered 2022-02-17: 1000 mL

## 2022-02-17 MED ORDER — ENSURE SURGERY PO LIQD
237.0000 mL | Freq: Two times a day (BID) | ORAL | Status: DC
  Administered 2022-02-18 – 2022-02-21 (×6): 237 mL via ORAL

## 2022-02-17 MED ORDER — HYDRALAZINE HCL 20 MG/ML IJ SOLN: 10.0000 mg | INTRAMUSCULAR | Status: DC | PRN

## 2022-02-17 MED ORDER — VITAMIN B-12 1000 MCG PO TABS
1000.0000 ug | ORAL_TABLET | Freq: Every day | ORAL | Status: DC
  Administered 2022-02-18 – 2022-02-21 (×4): 1000 ug via ORAL
  Filled 2022-02-17 (×4): qty 1

## 2022-02-17 MED ORDER — DORZOLAMIDE HCL 2 % OP SOLN
1.0000 [drp] | Freq: Two times a day (BID) | OPHTHALMIC | Status: DC
  Administered 2022-02-17 – 2022-02-21 (×8): 1 [drp] via OPHTHALMIC
  Filled 2022-02-17: qty 10

## 2022-02-17 MED ORDER — ALBUMIN HUMAN 5 % IV SOLN: INTRAVENOUS | Status: DC | PRN

## 2022-02-17 MED ORDER — CARVEDILOL 6.25 MG PO TABS
6.2500 mg | ORAL_TABLET | Freq: Two times a day (BID) | ORAL | Status: DC
Start: 2022-02-17 — End: 2022-02-21
  Administered 2022-02-17 – 2022-02-21 (×8): 6.25 mg via ORAL
  Filled 2022-02-17 (×8): qty 1

## 2022-02-17 MED ORDER — ALVIMOPAN 12 MG PO CAPS
12.0000 mg | ORAL_CAPSULE | Freq: Two times a day (BID) | ORAL | Status: DC
  Administered 2022-02-18: 12 mg via ORAL
  Filled 2022-02-17: qty 1

## 2022-02-17 MED ORDER — HEPARIN SODIUM (PORCINE) 5000 UNIT/ML IJ SOLN
5000.0000 [IU] | Freq: Three times a day (TID) | INTRAMUSCULAR | Status: DC
Start: 2022-02-17 — End: 2022-02-19
  Administered 2022-02-17 – 2022-02-19 (×5): 5000 [IU] via SUBCUTANEOUS
  Filled 2022-02-17 (×5): qty 1

## 2022-02-17 MED ORDER — PROPOFOL 10 MG/ML IV BOLUS
INTRAVENOUS | Status: AC
  Filled 2022-02-17: qty 20

## 2022-02-17 MED ORDER — SODIUM CHLORIDE 0.9 % IV SOLN: INTRAVENOUS | Status: DC | PRN

## 2022-02-17 MED ORDER — LINAGLIPTIN 5 MG PO TABS
5.0000 mg | ORAL_TABLET | Freq: Every day | ORAL | Status: DC
  Administered 2022-02-18 – 2022-02-21 (×4): 5 mg via ORAL
  Filled 2022-02-17 (×4): qty 1

## 2022-02-17 MED ORDER — ACETAMINOPHEN 500 MG PO TABS
1000.0000 mg | ORAL_TABLET | ORAL | Status: AC
  Administered 2022-02-17: 1000 mg via ORAL
  Filled 2022-02-17: qty 2

## 2022-02-17 MED ORDER — SODIUM CHLORIDE 0.9 % IV SOLN
2.0000 g | INTRAVENOUS | Status: AC
  Administered 2022-02-17: 2 g via INTRAVENOUS
  Filled 2022-02-17: qty 2

## 2022-02-17 MED ORDER — FENTANYL CITRATE (PF) 100 MCG/2ML IJ SOLN
INTRAMUSCULAR | Status: DC | PRN
  Administered 2022-02-17 (×2): 25 ug via INTRAVENOUS
  Administered 2022-02-17: 50 ug via INTRAVENOUS
  Administered 2022-02-17: 25 ug via INTRAVENOUS

## 2022-02-17 MED ORDER — ALUM & MAG HYDROXIDE-SIMETH 200-200-20 MG/5ML PO SUSP: 30.0000 mL | Freq: Four times a day (QID) | ORAL | Status: DC | PRN

## 2022-02-17 MED ORDER — ENSURE PRE-SURGERY PO LIQD
592.0000 mL | Freq: Once | ORAL | Status: DC
  Filled 2022-02-17: qty 592

## 2022-02-17 MED ORDER — LORATADINE 10 MG PO TABS
10.0000 mg | ORAL_TABLET | Freq: Every morning | ORAL | Status: DC
  Administered 2022-02-18 – 2022-02-21 (×4): 10 mg via ORAL
  Filled 2022-02-17 (×4): qty 1

## 2022-02-17 MED ORDER — BUPIVACAINE LIPOSOME 1.3 % IJ SUSP
INTRAMUSCULAR | Status: AC
  Filled 2022-02-17: qty 20

## 2022-02-17 MED ORDER — ROCURONIUM BROMIDE 10 MG/ML (PF) SYRINGE
PREFILLED_SYRINGE | INTRAVENOUS | Status: AC
  Filled 2022-02-17: qty 10

## 2022-02-17 SURGICAL SUPPLY — 122 items
APPLIER CLIP 5 13 M/L LIGAMAX5 (MISCELLANEOUS)
APPLIER CLIP ROT 10 11.4 M/L (STAPLE)
BAG COUNTER SPONGE SURGICOUNT (BAG) IMPLANT
BLADE EXTENDED COATED 6.5IN (ELECTRODE) ×3 IMPLANT
CANNULA REDUC XI 12-8 STAPL (CANNULA) ×1
CANNULA REDUCER 12-8 DVNC XI (CANNULA) ×2 IMPLANT
CELLS DAT CNTRL 66122 CELL SVR (MISCELLANEOUS) IMPLANT
CHLORAPREP W/TINT 26 (MISCELLANEOUS) ×3 IMPLANT
CLIP APPLIE 5 13 M/L LIGAMAX5 (MISCELLANEOUS) IMPLANT
CLIP APPLIE ROT 10 11.4 M/L (STAPLE) IMPLANT
CLIP LIGATING HEM O LOK PURPLE (MISCELLANEOUS) IMPLANT
CLIP LIGATING HEMO O LOK GREEN (MISCELLANEOUS) IMPLANT
COVER SURGICAL LIGHT HANDLE (MISCELLANEOUS) ×6 IMPLANT
COVER TIP SHEARS 8 DVNC (MISCELLANEOUS) ×2 IMPLANT
COVER TIP SHEARS 8MM DA VINCI (MISCELLANEOUS) ×1
DEFOGGER SCOPE WARMER CLEARIFY (MISCELLANEOUS) ×3 IMPLANT
DERMABOND ADVANCED (GAUZE/BANDAGES/DRESSINGS) ×1
DERMABOND ADVANCED .7 DNX12 (GAUZE/BANDAGES/DRESSINGS) ×1 IMPLANT
DEVICE TROCAR PUNCTURE CLOSURE (ENDOMECHANICALS) IMPLANT
DRAIN CHANNEL 19F RND (DRAIN) ×1 IMPLANT
DRAPE ARM DVNC X/XI (DISPOSABLE) ×8 IMPLANT
DRAPE COLUMN DVNC XI (DISPOSABLE) ×2 IMPLANT
DRAPE DA VINCI XI ARM (DISPOSABLE) ×4
DRAPE DA VINCI XI COLUMN (DISPOSABLE) ×1
DRAPE SURG IRRIG POUCH 19X23 (DRAPES) ×3 IMPLANT
DRSG OPSITE POSTOP 4X10 (GAUZE/BANDAGES/DRESSINGS) IMPLANT
DRSG OPSITE POSTOP 4X6 (GAUZE/BANDAGES/DRESSINGS) IMPLANT
DRSG OPSITE POSTOP 4X8 (GAUZE/BANDAGES/DRESSINGS) IMPLANT
DRSG TEGADERM 2-3/8X2-3/4 SM (GAUZE/BANDAGES/DRESSINGS) ×15 IMPLANT
DRSG TEGADERM 4X4.75 (GAUZE/BANDAGES/DRESSINGS) ×3 IMPLANT
ELECT REM PT RETURN 15FT ADLT (MISCELLANEOUS) ×3 IMPLANT
ENDOLOOP SUT PDS II  0 18 (SUTURE)
ENDOLOOP SUT PDS II 0 18 (SUTURE) IMPLANT
EVACUATOR SILICONE 100CC (DRAIN) ×1 IMPLANT
GAUZE SPONGE 2X2 8PLY STRL LF (GAUZE/BANDAGES/DRESSINGS) ×2 IMPLANT
GAUZE SPONGE 4X4 12PLY STRL (GAUZE/BANDAGES/DRESSINGS) IMPLANT
GLOVE BIO SURGEON STRL SZ7.5 (GLOVE) ×9 IMPLANT
GLOVE INDICATOR 8.0 STRL GRN (GLOVE) ×9 IMPLANT
GOWN SRG XL LVL 4 BRTHBL STRL (GOWNS) ×2 IMPLANT
GOWN STRL NON-REIN XL LVL4 (GOWNS) ×1
GOWN STRL REUS W/ TWL XL LVL3 (GOWN DISPOSABLE) ×10 IMPLANT
GOWN STRL REUS W/TWL XL LVL3 (GOWN DISPOSABLE) ×5
GRASPER SUT TROCAR 14GX15 (MISCELLANEOUS) IMPLANT
HOLDER FOLEY CATH W/STRAP (MISCELLANEOUS) ×3 IMPLANT
IRRIG SUCT STRYKERFLOW 2 WTIP (MISCELLANEOUS) ×3
IRRIGATION SUCT STRKRFLW 2 WTP (MISCELLANEOUS) ×2 IMPLANT
KIT PROCEDURE DA VINCI SI (MISCELLANEOUS)
KIT PROCEDURE DVNC SI (MISCELLANEOUS) IMPLANT
KIT TURNOVER KIT A (KITS) IMPLANT
LIGASURE IMPACT 36 18CM CVD LR (INSTRUMENTS) ×2 IMPLANT
NDL INSUFFLATION 14GA 120MM (NEEDLE) ×1 IMPLANT
NEEDLE INSUFFLATION 14GA 120MM (NEEDLE) ×3 IMPLANT
PACK CARDIOVASCULAR III (CUSTOM PROCEDURE TRAY) ×3 IMPLANT
PACK COLON (CUSTOM PROCEDURE TRAY) ×3 IMPLANT
PAD POSITIONING PINK XL (MISCELLANEOUS) ×3 IMPLANT
PENCIL SMOKE EVACUATOR (MISCELLANEOUS) ×2 IMPLANT
PROTECTOR NERVE ULNAR (MISCELLANEOUS) ×6 IMPLANT
RELOAD PROXIMATE 75MM BLUE (ENDOMECHANICALS) ×6 IMPLANT
RELOAD STAPLE 45 3.5 BLU DVNC (STAPLE) IMPLANT
RELOAD STAPLE 45 4.3 GRN DVNC (STAPLE) IMPLANT
RELOAD STAPLE 60 3.5 BLU DVNC (STAPLE) IMPLANT
RELOAD STAPLE 60 4.3 GRN DVNC (STAPLE) IMPLANT
RELOAD STAPLE 75 3.8 BLU REG (ENDOMECHANICALS) IMPLANT
RELOAD STAPLER 3.5X45 BLU DVNC (STAPLE) IMPLANT
RELOAD STAPLER 3.5X60 BLU DVNC (STAPLE) IMPLANT
RELOAD STAPLER 4.3X45 GRN DVNC (STAPLE) IMPLANT
RELOAD STAPLER 4.3X60 GRN DVNC (STAPLE) IMPLANT
RETRACTOR WND ALEXIS 18 MED (MISCELLANEOUS) IMPLANT
RTRCTR WOUND ALEXIS 18CM MED (MISCELLANEOUS)
SCISSORS LAP 5X35 DISP (ENDOMECHANICALS) IMPLANT
SEAL CANN UNIV 5-8 DVNC XI (MISCELLANEOUS) ×8 IMPLANT
SEAL XI 5MM-8MM UNIVERSAL (MISCELLANEOUS) ×4
SEALER VESSEL DA VINCI XI (MISCELLANEOUS) ×1
SEALER VESSEL EXT DVNC XI (MISCELLANEOUS) ×2 IMPLANT
SLEEVE ADV FIXATION 5X100MM (TROCAR) IMPLANT
SOLUTION ELECTROLUBE (MISCELLANEOUS) ×3 IMPLANT
SPIKE FLUID TRANSFER (MISCELLANEOUS) ×3 IMPLANT
SPONGE GAUZE 2X2 STER 10/PKG (GAUZE/BANDAGES/DRESSINGS) ×1
STAPLER CANNULA SEAL DVNC XI (STAPLE) ×2 IMPLANT
STAPLER CANNULA SEAL XI (STAPLE) ×1
STAPLER ECHELON POWER CIR 29 (STAPLE) IMPLANT
STAPLER ECHELON POWER CIR 31 (STAPLE) IMPLANT
STAPLER PROXIMATE 75MM BLUE (STAPLE) ×2 IMPLANT
STAPLER RELOAD 3.5X45 BLU DVNC (STAPLE)
STAPLER RELOAD 3.5X45 BLUE (STAPLE)
STAPLER RELOAD 3.5X60 BLU DVNC (STAPLE)
STAPLER RELOAD 3.5X60 BLUE (STAPLE)
STAPLER RELOAD 4.3X45 GREEN (STAPLE)
STAPLER RELOAD 4.3X45 GRN DVNC (STAPLE)
STAPLER RELOAD 4.3X60 GREEN (STAPLE)
STAPLER RELOAD 4.3X60 GRN DVNC (STAPLE)
STOPCOCK 4 WAY LG BORE MALE ST (IV SETS) ×6 IMPLANT
SURGILUBE 2OZ TUBE FLIPTOP (MISCELLANEOUS) ×3 IMPLANT
SUT MNCRL AB 4-0 PS2 18 (SUTURE) ×5 IMPLANT
SUT PDS AB 1 CT1 27 (SUTURE) ×2 IMPLANT
SUT PDS AB 1 TP1 96 (SUTURE) IMPLANT
SUT PROLENE 0 CT 2 (SUTURE) IMPLANT
SUT PROLENE 2 0 KS (SUTURE) ×3 IMPLANT
SUT PROLENE 2 0 SH DA (SUTURE) IMPLANT
SUT SILK 2 0 (SUTURE) ×1
SUT SILK 2 0 SH CR/8 (SUTURE) ×2 IMPLANT
SUT SILK 2-0 18XBRD TIE 12 (SUTURE) ×1 IMPLANT
SUT SILK 3 0 (SUTURE) ×1
SUT SILK 3 0 SH CR/8 (SUTURE) ×3 IMPLANT
SUT SILK 3-0 18XBRD TIE 12 (SUTURE) ×2 IMPLANT
SUT V-LOC BARB 180 2/0GR6 GS22 (SUTURE) ×6
SUT VIC AB 3-0 SH 18 (SUTURE) IMPLANT
SUT VIC AB 3-0 SH 27 (SUTURE)
SUT VIC AB 3-0 SH 27XBRD (SUTURE) IMPLANT
SUT VICRYL 0 UR6 27IN ABS (SUTURE) ×3 IMPLANT
SUTURE V-LC BRB 180 2/0GR6GS22 (SUTURE) ×2 IMPLANT
SYR 10ML LL (SYRINGE) ×3 IMPLANT
SYS LAPSCP GELPORT 120MM (MISCELLANEOUS)
SYS WOUND ALEXIS 18CM MED (MISCELLANEOUS) ×3
SYSTEM LAPSCP GELPORT 120MM (MISCELLANEOUS) IMPLANT
SYSTEM WOUND ALEXIS 18CM MED (MISCELLANEOUS) ×2 IMPLANT
TAPE UMBILICAL 1/8 X36 TWILL (MISCELLANEOUS) ×3 IMPLANT
TOWEL OR NON WOVEN STRL DISP B (DISPOSABLE) ×3 IMPLANT
TRAY FOLEY MTR SLVR 16FR STAT (SET/KITS/TRAYS/PACK) ×3 IMPLANT
TROCAR ADV FIXATION 5X100MM (TROCAR) ×5 IMPLANT
TUBING CONNECTING 10 (TUBING) ×6 IMPLANT
TUBING INSUFFLATION 10FT LAP (TUBING) ×3 IMPLANT

## 2022-02-17 NOTE — Anesthesia Procedure Notes (Addendum)
Arterial Line Insertion Start/End7/21/2023 10:38 AM, 02/17/2022 10:41 AM Performed by: Myrtie Soman, MD, Lind Covert, CRNA, CRNA  Patient location: OOR procedure area. Preanesthetic checklist: patient identified, IV checked, site marked, risks and benefits discussed, surgical consent, monitors and equipment checked, pre-op evaluation, timeout performed and anesthesia consent Lidocaine 1% used for infiltration and patient sedated radial was placed Catheter size: 20 G Hand hygiene performed  and maximum sterile barriers used   Attempts: 1 Procedure performed without using ultrasound guided technique. Ultrasound Notes:anatomy identified, needle tip was noted to be adjacent to the nerve/plexus identified and no ultrasound evidence of intravascular and/or intraneural injection Following insertion, dressing applied and Biopatch. Post procedure assessment: normal  Patient tolerated the procedure well with no immediate complications.

## 2022-02-17 NOTE — Anesthesia Preprocedure Evaluation (Signed)
Anesthesia Evaluation  Patient identified by MRN, date of birth, ID band Patient awake    Reviewed: Allergy & Precautions, NPO status , Patient's Chart, lab work & pertinent test results  Airway Mallampati: II  TM Distance: >3 FB Neck ROM: Full    Dental no notable dental hx.    Pulmonary neg pulmonary ROS, former smoker,    Pulmonary exam normal breath sounds clear to auscultation       Cardiovascular hypertension, + Peripheral Vascular Disease and + DVT  Normal cardiovascular exam Rhythm:Regular Rate:Normal     Neuro/Psych negative neurological ROS  negative psych ROS   GI/Hepatic negative GI ROS, Neg liver ROS,   Endo/Other  diabetes  Renal/GU Renal disease  negative genitourinary   Musculoskeletal negative musculoskeletal ROS (+)   Abdominal   Peds negative pediatric ROS (+)  Hematology  (+) Blood dyscrasia, anemia ,   Anesthesia Other Findings   Reproductive/Obstetrics negative OB ROS                             Anesthesia Physical Anesthesia Plan  ASA: 3  Anesthesia Plan: General   Post-op Pain Management: Lidocaine infusion*   Induction: Intravenous  PONV Risk Score and Plan: 2 and Ondansetron, Dexamethasone and Treatment may vary due to age or medical condition  Airway Management Planned: Oral ETT  Additional Equipment: Arterial line  Intra-op Plan:   Post-operative Plan: Extubation in OR  Informed Consent: I have reviewed the patients History and Physical, chart, labs and discussed the procedure including the risks, benefits and alternatives for the proposed anesthesia with the patient or authorized representative who has indicated his/her understanding and acceptance.     Dental advisory given  Plan Discussed with: CRNA and Surgeon  Anesthesia Plan Comments:         Anesthesia Quick Evaluation

## 2022-02-17 NOTE — Anesthesia Procedure Notes (Signed)
Procedure Name: Intubation Date/Time: 02/17/2022 11:10 AM  Performed by: Lind Covert, CRNAPre-anesthesia Checklist: Patient identified, Emergency Drugs available, Suction available, Patient being monitored and Timeout performed Patient Re-evaluated:Patient Re-evaluated prior to induction Oxygen Delivery Method: Circle system utilized Preoxygenation: Pre-oxygenation with 100% oxygen Induction Type: IV induction Ventilation: Mask ventilation without difficulty Laryngoscope Size: Mac and 4 Grade View: Grade I Tube type: Oral Tube size: 7.5 mm Number of attempts: 1 Airway Equipment and Method: Stylet Placement Confirmation: ETT inserted through vocal cords under direct vision, positive ETCO2 and breath sounds checked- equal and bilateral Secured at: 23 cm Tube secured with: Tape

## 2022-02-17 NOTE — Transfer of Care (Signed)
Immediate Anesthesia Transfer of Care Note  Patient: Herbert Wood  Procedure(s) Performed: XI ROBOTIC ASSISTED LEFT HEMICOLECTOMY, TAKEDOWN OF SPLENIC FLEXURE, INTRAOPERATIVE ASSESSMENT OF PERFUSION USING FIREFLY INJECTION, AND BILATERAL TAP BLOCK (Left)  Patient Location: PACU  Anesthesia Type:General  Level of Consciousness: sedated  Airway & Oxygen Therapy: Patient Spontanous Breathing and Patient connected to face mask oxygen  Post-op Assessment: Report given to RN and Post -op Vital signs reviewed and stable  Post vital signs: Reviewed and stable  Last Vitals:  Vitals Value Taken Time  BP    Temp    Pulse 56 02/17/22 1437  Resp 17 02/17/22 1437  SpO2 100 % 02/17/22 1437  Vitals shown include unvalidated device data.  Last Pain:  Vitals:   02/17/22 1037  TempSrc:   PainSc: 0-No pain      Patients Stated Pain Goal: 4 (50/15/86 8257)  Complications: No notable events documented.

## 2022-02-17 NOTE — Op Note (Signed)
PATIENT: Herbert Wood  87 y.o. male  Patient Care Team: Pomposini, Cherly Anderson, MD as PCP - General (Internal Medicine) Bonney Leitz, MD as Referring Physician (Hematology and Oncology) Brien Mates, RN as Oncology Nurse Navigator (Medical Oncology) Derek Jack, MD as Medical Oncologist (Medical Oncology)  PREOP DIAGNOSIS: CANCER LEFT COLON  POSTOP DIAGNOSIS: CANCER LEFT COLON  PROCEDURE:  Robotic assisted left hemicolectomy Takedown of splenic flexure Intraoperative assessment of perfusion using ICG fluorescence imaging Bilateral transversus abdominus plane (TAP) blocks  SURGEON: Sharon Mt. Myah Guynes, MD  ASSISTANT: Leighton Ruff, MD  ANESTHESIA: General endotracheal  EBL: 50 mL Total I/O In: 1607 [I.V.:1400; Blood:315] Out: 200 [Urine:150; Blood:50]  DRAINS: None  SPECIMEN: Left colon, stitch marks proximal staple line  COUNTS: Sponge, needle and instrument counts were reported correct x2  FINDINGS: Tattoo evident at the splenic flexure with associated mass. Fairly dense inflammatory type adhesions about the splenic flexure likely related to the prior inflammatory process seen at this location. Segmental colectomy of entire splenic flexure and much of descending colon, including left branch of middle colic vessels. Associated omentum was included with the specimen. Oriented suture proximally.   Perfusion assessment showed a very well perfused colon proximally and distally out to the staple lines. A tension free side-to-side transverse colon to proximal descending colon anastomosis was fashioned.   NARRATIVE: Informed consent was verified. The patient was taken to the operating room, placed supine on the operating table and SCD's were applied. General endotracheal anesthesia was induced without difficulty.  He was then positioned in the lithotomy position with Allen stirrups.  Pressure points were evaluated and padded.  A foley catheter was then placed  by nursing under sterile conditions. Hair on the abdomen was clipped.  He was secured to the operating table. The abdomen was then prepped and draped in the standard sterile fashion. Surgical timeout was called indicating the correct patient, procedure, positioning and need for preoperative antibiotics.   An OG tube was placed by anesthesia and confirmed to be to suction.  At Palmer's point, a stab incision was created and the Veress needle was introduced into the peritoneal cavity on the first attempt.  Intraperitoneal location was confirmed by the aspiration and saline drop test.  Pneumoperitoneum was established to a maximum pressure of 15 mmHg using CO2.  Following this, the abdomen was marked for planned trocar sites.  Just to the right and cephalad to the umbilicus, an 8 mm incision was created and an 8 mm blunt tipped robotic trocar was cautiously placed into the peritoneal cavity.  The laparoscope was inserted and demonstrated no evidence of trocar site nor Veress needle site complications.  The Veress needle was removed.  Bilateral transversus abdominis plane blocks were then created using a dilute mixture of Exparel with Marcaine.  3 additional 8 mm robotic trochars were placed under direct visualization roughly in a line extending from the right ASIS towards the left upper quadrant. The bladder was inspected and noted to be at/below the pubic symphysis.  Staying 3 fingerbreadths above the pubic symphysis, an incision was created and the 12 mm robotic trocar inserted directed cephalad into the peritoneal cavity under direct visualization.  An additional 5 mm assist port was placed in the right lateral abdomen under direct visualization.  The abdomen was surveyed and there was omental containing adhesions across a portion of the midline likely related to prior surgery.  He was positioned in Trendelenburg with the left side tilted slightly up.  Small bowel was  carefully retracted out of the pelvis.  The  robot was then docked and I went to the console.  We began with adhesiolysis. Adhesions consisting of omentum were then taken down using the vessel sealer device.  There was no bowel involved in these adhesions.  He had a fairly redundant falciform ligament and this was partially taken down to facilitate the procedure.  The abdomen is surveyed and the peritoneal surfaces are grossly normal appearance.  The liver surface is normal in appearance.  He was positioned in Trendelenburg with left side up.  The left upper quadrant is visualized and were able to identify a tattoo at the level of the splenic flexure.  We began by mobilizing the descending colon.  This was retracted medially and the Lanya Bucks line of Toldt was incised allowing [place] to reflect the descending colon and associated mesocolon medially.  The splenic flexure was then approached.  We mobilized this is much as we could from this approach.  He was then repositioned in reverse Trendelenburg.  The omentum was quite adherent to the splenic flexure of the colon where the mass is located.  There is tattoo again visualized at this spot.  We opted to include the associated omentum with the specimen.  The stomach is identified.  The lesser sac is entered by taking the omental attachments off of the greater curvature of the stomach.  We took care to preserve the stomach free of injury.  The omentum was divided out to the level of the mid/distal transverse colon.  We then approached the splenic flexure from this more medial to lateral type approach.  The splenocolic ligaments were released allowing [place] to mobilize the splenic flexure free.  There are fairly dense adhesions around this location likely related to the prior inflammatory process at the spot.  There is no evident full-thickness extension of the colon mass.  The splenic flexure is now been fully mobilized.  In order to facilitate an oncologic type resection and have 2 well-perfused ends, the entire  descending colon is mobilized and proximal portions of the sigmoid colon are also mobilized from the lateral attachments.   The robot is undocked.  I scrubbed back in.  An extraction incision is created in the supraumbilical midline.  The skin is incised and the fascia incised sharply.  The peritoneum was entered.  An Cobb wound protector was placed.  Towels were placed on the field.  The splenic flexure was then brought through the wound protector.  We then inspected the mesentery of the transverse colon.  He has a dominant middle colic vessel that is well proximal to the mass.  We opted to include the left branch of the middle colic vessels for oncologic purposes.  A window was created in the mid/distal transverse colon, at least 5 cm proximal to the mass.  The colon at this level then divided using a GIA 75 mm blue load stapler.  The distal point of transection is identified on the mid/distal descending colon.  The colon at both of the sides is supple and healthy in appearance.  A wound was created the mesentery at this level and the colon was divided with a 75 mm GIA blue load stapler.  The intervening mesentery including the left branch of the middle colic vessels was then divided using a hand-held LigaSure device.  The cut edge of the mesentery is inspected and verified to be hemostatic.  The specimen is oriented with the proximal end marked with a suture.  This is passed off.  This includes the associated omentum.  An ICG perfusion test is carried out.  2.5 mg of ICG was given by our anesthesia colleagues.  Under near infrared light, both ends of the colon were inspected and noted to have avid uptake of the ICG.  This is consistent with a well perfused result.  There is a palpable pulse in the mesentery going out to each respective staple line.  Attention was directed at creating a colocolonic anastomosis.  The ends come together easily without any tension.  We opted to therefore perform a  side-to-side anastomosis.  Colotomies were created at each respective staple line.  On the antimesenteric border of 75 mm GIA blue load stapler was used to create the colocolonic anastomosis.  The anastomosis is inspected and noted to be hemostatic with well-formed staples.  A suture was placed at the apex of the anastomosis.  The common colotomy was then closed using 2 running 2-0 V-Loc sutures in a Connell manner.  The closure was inspected for any defects and none are identified.  This is placed back into the abdomen.  The abdomen is then irrigated with sterile saline and hemostasis is verified.  The midline fascia was then closed using 2 running #1 PDS sutures.  The fascia was then palpated and noted to be completely closed.  Additional anesthetic was infiltrated.  Sponge, needle, and instrument counts were reported correct x2. 4-0 Monocryl subcuticular suture was used to close the skin of all incision sites.  Dermabond was placed over all incisions.   He was then taken out of lithotomy, awakened from anesthesia, extubated, and transferred to a stretcher for transport to PACU in satisfactory condition having tolerated the procedure well.

## 2022-02-17 NOTE — H&P (Signed)
CC: Here today for surgery  HPI: Herbert Wood is an 87 y.o. male with history of HTN, HLD, Factor V Leiden with hx of DVT in past (on Eliquis), Iron deficiency anemia, GERD, whom was seen in the office 6/21 as a referral by Dr. Montez Morita for evaluation of recently diagnosed splenic flexure colon cancer.  CT A/P 11/01/21 for abdominal pain suggested acute diverticulitis at the splenic flexure with a 3.5 cm "intramural abscess." No obvious colonic masses were seen.  He underwent colonoscopy with Dr. Jenetta Downer 01/10/22: 2 sessile polyps in transverse colon - removed - Fungating, ulcerated, partially obstructing mass at 42 cm from the anus. This was circumferential. Measured 3 cm in length. No bleeding. Biopsied and area distally was then tattooed. Pediatric scope was able to get past this lesion.  PATH: D. COLON, TRANSVERSE, POLYPECTOMY:  - Fragments of at least intramucosal adenocarcinoma and high-grade  dysplasia, see comment  - Separate fragment of a tubular adenoma without high-grade dysplasia   E. COLON, DESCENDING, MASS, BIOPSY:  - Invasive moderately differentiated adenocarcinoma, see comment   Staging CT chest/abdomen/pelvis 01/13/2022: 1. No evidence of metastatic disease in the chest, abdomen, or pelvis. 2. Focal area of thickening of the splenic flexure and proximal descending colon which has improved since the prior CT likely representing improvement of the previously seen diverticulitis. Underlying mass is not excluded. Correlation with colon cancer screening markers recommended. Colonoscopy may provide better evaluation if clinically indicated. 3. Several tiny pulmonary nodules as well as scattered areas of interstitial irregularity in the right upper lobe, likely post inflammatory. No suspicious nodule. Attention on follow-up imaging recommended. 4. Aortic Atherosclerosis (ICD10-I70.0).  I spoke with Dr. Montez Morita 6/21 and reviewed colonoscopy images...  He had spoken with pathology and the transverse colon polyp being benign with findings noted in report secondary to scope channel contamination and not a separate cancerous polyp  He reports he has been doing well. He denies any abdominal pain. He did have some abdominal pain when he first had this unusual "diverticulitis" of the splenic flexure. He has had none since that time. He has a good appetite. No nausea or vomiting. He has had an unintentional approximate 25 pound weight loss over the last 4 to 6 months. He is here today with his wife. Generally does not have bright red blood per rectum except for when he took his colonoscopy prep.  Denies any changes in his health or health history since we met in the office. Has been off his Eliquis since 7/16 as per his rheumatologist (not cardiologist as we initially thought). Tolerated prep with satisfactory result. Denies any complaints at present, states he is ready for surgery.   PMH: HTN, HLD, Factor V Leiden with hx of DVT in past (on Eliquis), Iron deficiency anemia, GERD  PSH: He denies any prior abdominal or pelvic surgical history  FHx: Denies any known family history of colorectal, breast, endometrial or ovarian cancer  Social Hx: Denies use of tobacco/EtOH/illicit drug. He is retired, previously worked at a [employer], retail. He is here today with his wife whom helps run a Engineer, manufacturing in [place].   Past Medical History:  Diagnosis Date   Adenomatous colon polyp    Anemia    Carotid bruit    Chronic dyspnea    Diabetes (HCC)    type 2   Diverticular disease    DVT (deep venous thrombosis) (HCC)    lower limb   Dyspnea  Factor V deficiency ([place])    Hearing loss    bilateral   Hyperlipemia    Hypertension    Inguinal hernia    Kidney disease    stage 3   Lung nodule    Lymphadenopathy    Malignant neoplasm (HCC)    salivary gland   Murmur    Osteoarthritis     Senile purpura ([place])     Past Surgical History:  Procedure Laterality Date   BIOPSY  01/10/2022   Procedure: BIOPSY;  Surgeon: Harvel Quale, MD;  [facility];  Service: Gastroenterology;;   COLONOSCOPY WITH PROPOFOL N/A 01/10/2022   Procedure: COLONOSCOPY WITH PROPOFOL;  Surgeon: Harvel Quale, MD;  [facility];  Service: Gastroenterology;  Laterality: N/A;  per Darius Bump, pt to arrive at 9:15   ESOPHAGOGASTRODUODENOSCOPY (EGD) WITH PROPOFOL N/A 01/10/2022   Procedure: ESOPHAGOGASTRODUODENOSCOPY (EGD) WITH PROPOFOL;  Surgeon: Harvel Quale, MD;  [facility];  Service: Gastroenterology;  Laterality: N/A;   HAND / FINGER LESION EXCISION     HERNIA REPAIR     MASTOIDECTOMY     PILONIDAL CYST EXCISION     POLYPECTOMY  01/10/2022   Procedure: POLYPECTOMY;  Surgeon: Harvel Quale, MD;  [facility];  Service: Gastroenterology;;   SUBMUCOSAL TATTOO INJECTION  01/10/2022   Procedure: SUBMUCOSAL TATTOO INJECTION;  Surgeon: Harvel Quale, MD;  [facility];  Service: Gastroenterology;;   SURGERY OF LIP     skin cancer    No family history on file.  Social:  reports that he quit smoking about 28 years ago. His smoking use included cigarettes. He has a 50.00 pack-year smoking history. He has been exposed to tobacco smoke. He has never used smokeless tobacco. He reports that he does not drink alcohol and does not use drugs.  Allergies:  Allergies  Allergen Reactions   Gadobenate Nausea And Vomiting    Immediately upon the infusion of 29m multihance contrast  Patient had exteme nausea and vomiting.   No other symptoms noted .  No injury.  MRI scan was completed after patient felt better.   Immediately upon the infusion of 152mmultihance contrast  Patient had exteme nausea and vomiting.   No other symptoms noted .  No injury.  MRI scan was completed after patient felt better.        Medications: I have reviewed the patient's current medications.  No results found for this or any previous visit (from the past 48 hour(s)).  No results found.  ROS - all of the below systems have been reviewed with the patient and positives are indicated with bold text General: chills, fever or night sweats Eyes: blurry vision or double vision ENT: epistaxis or sore throat Allergy/Immunology: itchy/watery eyes or nasal congestion Hematologic/Lymphatic: bleeding problems, blood clots or swollen lymph nodes Endocrine: temperature intolerance or unexpected weight changes Breast: new or changing breast lumps or nipple discharge Resp: cough, shortness of breath, or wheezing CV: chest pain or dyspnea on exertion GI: as per HPI GU: dysuria, trouble voiding, or hematuria MSK: joint pain or joint stiffness Neuro: TIA or stroke symptoms Derm: pruritus and skin lesion changes Psych: anxiety and depression  PE There were no vitals taken for this visit. Constitutional: NAD; conversant Eyes: Moist conjunctiva; no lid lag Lungs: Normal respiratory effort CV: RRR; no pitting edema GI: Abd soft, NT/ND MSK: Normal range of motion of extremities Psychiatric: Appropriate affect; alert and oriented x3  No results found for this or any previous visit (from the past 48 hour(s)).  No results found.  A/P: Herbert Wood is an 87 y.o. 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  -Sees rheumatology whom had stopped his blood thinner perioperatively -He is anemic and requiring IV iron infusion to maintain levels which is most likely a result of his bleeding colon cancer. I therefore think it would be appropriate  to proceed with surgery as planned  -The anatomy and physiology of the GI tract was reviewed with the patient again today. The pathophysiology of colon cancer was discussed as well. -We have discussed various different treatment options going forward including surgery (the most definitive) to address this -robotic assisted segmental colectomy presumably involving splenic flexure; possible flexible sigmoidoscopy -The planned procedures, material risks (including, but not limited to, pain, bleeding, infection, scarring, need for blood transfusion, damage to surrounding structures- blood vessels/nerves/viscus/organs, damage to ureter, urine leak, leak from anastomosis, need for additional procedures, scenarios where a stoma may be necessary and where it may be permanent, worsening of pre-existing medical conditions, hernia, recurrence, pneumonia, heart attack, stroke, death) benefits and alternatives to surgery were discussed at length. The patient's questions were answered to his satisfaction, he voiced understanding and elected to proceed with surgery. Additionally, we discussed typical postoperative expectations and the recovery process.   Nadeen Landau, [facility], [place]

## 2022-02-17 NOTE — Anesthesia Postprocedure Evaluation (Signed)
Anesthesia Post Note  Patient: Herbert Wood  Procedure(s) Performed: XI ROBOTIC ASSISTED LEFT HEMICOLECTOMY, TAKEDOWN OF SPLENIC FLEXURE, INTRAOPERATIVE ASSESSMENT OF PERFUSION USING FIREFLY INJECTION, AND BILATERAL TAP BLOCK (Left)     Patient location during evaluation: PACU Anesthesia Type: General Level of consciousness: awake and alert Pain management: pain level controlled Vital Signs Assessment: post-procedure vital signs reviewed and stable Respiratory status: spontaneous breathing, nonlabored ventilation, respiratory function stable and patient connected to nasal cannula oxygen Cardiovascular status: blood pressure returned to baseline and stable Postop Assessment: no apparent nausea or vomiting Anesthetic complications: no   No notable events documented.  Last Vitals:  Vitals:   02/17/22 1515 02/17/22 1530  BP: (!) 106/49 (!) 115/54  Pulse: (!) 58 (!) 58  Resp: 15 18  Temp:    SpO2: 98% 99%    Last Pain:  Vitals:   02/17/22 1530  TempSrc:   PainSc: Asleep                 Geneieve Duell S

## 2022-02-18 LAB — TYPE AND SCREEN
ABO/RH(D): O POS
Antibody Screen: NEGATIVE
Unit division: 0

## 2022-02-18 LAB — BPAM RBC
Blood Product Expiration Date: 202308212359
ISSUE DATE / TIME: 202307211137
Unit Type and Rh: 5100

## 2022-02-18 LAB — BASIC METABOLIC PANEL
Anion gap: 6 (ref 5–15)
BUN: 27 mg/dL — ABNORMAL HIGH (ref 8–23)
CO2: 23 mmol/L (ref 22–32)
Calcium: 8.8 mg/dL — ABNORMAL LOW (ref 8.9–10.3)
Chloride: 112 mmol/L — ABNORMAL HIGH (ref 98–111)
Creatinine, Ser: 1.54 mg/dL — ABNORMAL HIGH (ref 0.61–1.24)
GFR, Estimated: 44 mL/min — ABNORMAL LOW (ref >60)
Glucose, Bld: 163 mg/dL — ABNORMAL HIGH (ref 70–99)
Potassium: 4.5 mmol/L (ref 3.5–5.1)
Sodium: 141 mmol/L (ref 135–145)

## 2022-02-18 LAB — CBC
HCT: 27.7 % — ABNORMAL LOW (ref 39.0–52.0)
Hemoglobin: 8.8 g/dL — ABNORMAL LOW (ref 13.0–17.0)
MCH: 31.2 pg (ref 26.0–34.0)
MCHC: 31.8 g/dL (ref 30.0–36.0)
MCV: 98.2 fL (ref 80.0–100.0)
Platelets: 204 10*3/uL (ref 150–400)
RBC: 2.82 MIL/uL — ABNORMAL LOW (ref 4.22–5.81)
RDW: 19.6 % — ABNORMAL HIGH (ref 11.5–15.5)
WBC: 15.3 10*3/uL — ABNORMAL HIGH (ref 4.0–10.5)
nRBC: 0 % (ref 0.0–0.2)

## 2022-02-18 MED ORDER — TRAMADOL HCL 50 MG PO TABS
50.0000 mg | ORAL_TABLET | Freq: Four times a day (QID) | ORAL | Status: DC | PRN
  Administered 2022-02-19: 50 mg via ORAL
  Administered 2022-02-19: 100 mg via ORAL
  Filled 2022-02-18 (×2): qty 2
  Filled 2022-02-18: qty 1

## 2022-02-18 NOTE — TOC Progression Note (Signed)
Transition of Care (TOC) - Progression Note   Transition of Care (TOC) Screening Note  Patient Details  Name: Herbert Wood Date of Birth: 10-23-1934  [facility] CM/SW Contact:    Sherie Don, LCSW Phone Number: 02/18/2022, 11:07 AM  Transition of Care Department [facility]) has reviewed patient and no TOC needs have been identified at this time. We will continue to monitor patient advancement through interdisciplinary progression rounds. If new patient transition needs arise, please place a TOC consult.  Barriers to Discharge: No Barriers Identified  Readmission Risk Interventions    02/18/2022   11:07 AM  Readmission Risk Prevention Plan  Transportation Screening Complete  HRI or Home Care Consult Complete  Social Work Consult for [place] Planning/Counseling Complete  Palliative Care Screening Not Applicable

## 2022-02-18 NOTE — Progress Notes (Signed)
1 Day Post-Op Robotic assisted partial colectomy Subjective: Had some nausea post op but better today  Objective: Vital signs in last 24 hours: Temp:  [97.4 F (36.3 C)-97.9 F (36.6 C)] 97.9 F (36.6 C) (07/22 0546) Pulse Rate:  [56-78] 59 (07/22 0546) Resp:  [15-23] 16 (07/22 0546) BP: (104-159)/(48-58) 147/51 (07/22 0546) SpO2:  [95 %-100 %] 95 % (07/22 0546) Arterial Line BP: (154-174)/(45-57) 156/45 (07/21 1545) Weight:  [90.7 kg-97.1 kg] 97.1 kg (07/22 0546)   Intake/Output from previous day: 07/21 0701 - 07/22 0700 In: 3841.6 [P.O.:180; I.V.:3096.6; Blood:315; IV Piggyback:250] Out: 1730 [Urine:1680; Blood:50] Intake/Output this shift: Total I/O In: 151.3 [I.V.:151.3] Out: 100 [Urine:100]   General appearance: alert and cooperative GI: soft, nondistended  Incision: no significant drainage  Lab Results:  Recent Labs    02/17/22 1548 02/18/22 0527  WBC 12.7* 15.3*  HGB 8.9* 8.8*  HCT 27.8* 27.7*  PLT 222 204   BMET Recent Labs    02/17/22 0940 02/17/22 1548 02/18/22 0527  NA 141  --  141  K 4.0  --  4.5  CL 112*  --  112*  CO2 19*  --  23  GLUCOSE 124*  --  163*  BUN 26*  --  27*  CREATININE 1.70* 1.71* 1.54*  CALCIUM 8.9  --  8.8*   PT/INR No results for input(s): "LABPROT", "INR" in the last 72 hours. ABG No results for input(s): "PHART", "HCO3" in the last 72 hours.  Invalid input(s): "PCO2", "PO2"  MEDS, Scheduled  acetaminophen  1,000 mg Oral Q6H   alvimopan  12 mg Oral BID   amitriptyline  10 mg Oral QPM   amLODipine  5 mg Oral BID   ascorbic acid  1,000 mg Oral Daily   atorvastatin  40 mg Oral QPM   carvedilol  6.25 mg Oral BID WC   cholecalciferol  5,000 Units Oral QPM   dorzolamide  1 drop Left Eye BID   feeding supplement  237 mL Oral BID BM   ferrous sulfate  325 mg Oral Q breakfast   folic acid  1 mg Oral QPM   heparin injection (subcutaneous)  5,000 Units Subcutaneous Q8H   hydrALAZINE  50 mg Oral BID   irbesartan  300  mg Oral QPM   latanoprost  1 drop Both Eyes QHS   linagliptin  5 mg Oral Q breakfast   loratadine  10 mg Oral q morning   pantoprazole  40 mg Oral Daily   vitamin B-12  1,000 mcg Oral Daily    Studies/Results: No results found.  Assessment: s/p Procedure(s): XI ROBOTIC ASSISTED LEFT HEMICOLECTOMY, TAKEDOWN OF SPLENIC FLEXURE, INTRAOPERATIVE ASSESSMENT OF PERFUSION USING FIREFLY INJECTION, AND BILATERAL TAP BLOCK Patient Active Problem List   Diagnosis Date Noted   S/P left hemicolectomy 02/17/2022   Cancer of sigmoid colon ([place]) 01/25/2022   NSAID long-term use    Diverticulitis 11/01/2021   HTN (hypertension) 11/01/2021    Class: Chronic   HLD (hyperlipidemia) 11/01/2021    Class: Chronic   Hard of hearing 11/01/2021    Class: Chronic   Leukocytosis 11/01/2021    Class: Acute   Factor V Leiden ([place]) 11/01/2021    Class: Chronic   History of DVT (deep vein thrombosis) 11/01/2021   DM (diabetes mellitus), type 2 ([place]) 11/01/2021    Class: Chronic   Chronic kidney disease (CKD), stage III (moderate) ([place]) 11/01/2021    Class: Chronic   Peripheral neuropathy 11/01/2021   Colonic diverticular abscess  11/01/2021    Class: Acute   Abnormal CT scan, colon 10/31/2021   Iron deficiency anemia 10/31/2021    Expected post op course  Plan: d/c foley Advance to fulls as tolerated Ambulate in hall SCD's Cont IVF's until pt tolerating PO better.  Cr almost back to baseline Hgb stable after intraop transfusion   LOS: 1 day     .Rosario Adie, [facility], [place]    02/18/2022 8:28 AM

## 2022-02-19 LAB — CBC
HCT: 28.5 % — ABNORMAL LOW (ref 39.0–52.0)
Hemoglobin: 9.2 g/dL — ABNORMAL LOW (ref 13.0–17.0)
MCH: 31.8 pg (ref 26.0–34.0)
MCHC: 32.3 g/dL (ref 30.0–36.0)
MCV: 98.6 fL (ref 80.0–100.0)
Platelets: 193 K/uL (ref 150–400)
RBC: 2.89 MIL/uL — ABNORMAL LOW (ref 4.22–5.81)
RDW: 19.2 % — ABNORMAL HIGH (ref 11.5–15.5)
WBC: 15.4 K/uL — ABNORMAL HIGH (ref 4.0–10.5)
nRBC: 0 % (ref 0.0–0.2)

## 2022-02-19 LAB — BASIC METABOLIC PANEL
Anion gap: 5 (ref 5–15)
BUN: 25 mg/dL — ABNORMAL HIGH (ref 8–23)
CO2: 24 mmol/L (ref 22–32)
Calcium: 8.7 mg/dL — ABNORMAL LOW (ref 8.9–10.3)
Chloride: 108 mmol/L (ref 98–111)
Creatinine, Ser: 1.27 mg/dL — ABNORMAL HIGH (ref 0.61–1.24)
GFR, Estimated: 55 mL/min — ABNORMAL LOW (ref >60)
Glucose, Bld: 124 mg/dL — ABNORMAL HIGH (ref 70–99)
Potassium: 4.3 mmol/L (ref 3.5–5.1)
Sodium: 137 mmol/L (ref 135–145)

## 2022-02-19 MED ORDER — OXYCODONE HCL 5 MG PO TABS
5.0000 mg | ORAL_TABLET | ORAL | Status: DC | PRN
  Administered 2022-02-19: 5 mg via ORAL
  Filled 2022-02-19: qty 1

## 2022-02-19 MED ORDER — APIXABAN 2.5 MG PO TABS
5.0000 mg | ORAL_TABLET | Freq: Two times a day (BID) | ORAL | Status: DC
  Administered 2022-02-19 – 2022-02-21 (×5): 5 mg via ORAL
  Filled 2022-02-19 (×5): qty 2

## 2022-02-19 NOTE — Progress Notes (Signed)
2 Days Post-Op Robotic assisted partial colectomy Subjective: Had quite a bit of pain last night, no nausea but having some belching.  No flatus or BM's yet.  Ambulated in the hall.  Objective: Vital signs in last 24 hours: Temp:  [97.7 F (36.5 C)-99 F (37.2 C)] 98 F (36.7 C) (07/23 0506) Pulse Rate:  [57-70] 69 (07/23 0506) Resp:  [16] 16 (07/23 0506) BP: (147-168)/(46-58) 168/58 (07/23 0506) SpO2:  [91 %-98 %] 91 % (07/23 0506)   Intake/Output from previous day: 07/22 0701 - 07/23 0700 In: 2276.8 [P.O.:600; I.V.:1676.8] Out: 1630 [Urine:1630] Intake/Output this shift: No intake/output data recorded.   General appearance: alert and cooperative GI: soft, nondistended  Incision: no significant drainage  Lab Results:  Recent Labs    02/18/22 0527 02/19/22 0443  WBC 15.3* 15.4*  HGB 8.8* 9.2*  HCT 27.7* 28.5*  PLT 204 193    BMET Recent Labs    02/18/22 0527 02/19/22 0443  NA 141 137  K 4.5 4.3  CL 112* 108  CO2 23 24  GLUCOSE 163* 124*  BUN 27* 25*  CREATININE 1.54* 1.27*  CALCIUM 8.8* 8.7*    PT/INR No results for input(s): "LABPROT", "INR" in the last 72 hours. ABG No results for input(s): "PHART", "HCO3" in the last 72 hours.  Invalid input(s): "PCO2", "PO2"  MEDS, Scheduled  acetaminophen  1,000 mg Oral Q6H   amitriptyline  10 mg Oral QPM   amLODipine  5 mg Oral BID   apixaban  5 mg Oral BID   ascorbic acid  1,000 mg Oral Daily   atorvastatin  40 mg Oral QPM   carvedilol  6.25 mg Oral BID WC   cholecalciferol  5,000 Units Oral QPM   dorzolamide  1 drop Left Eye BID   feeding supplement  237 mL Oral BID BM   ferrous sulfate  325 mg Oral Q breakfast   folic acid  1 mg Oral QPM   hydrALAZINE  50 mg Oral BID   irbesartan  300 mg Oral QPM   latanoprost  1 drop Both Eyes QHS   linagliptin  5 mg Oral Q breakfast   loratadine  10 mg Oral q morning   pantoprazole  40 mg Oral Daily   vitamin B-12  1,000 mcg Oral Daily     Studies/Results: No results found.  Assessment: s/p Procedure(s): XI ROBOTIC ASSISTED LEFT HEMICOLECTOMY, TAKEDOWN OF SPLENIC FLEXURE, INTRAOPERATIVE ASSESSMENT OF PERFUSION USING FIREFLY INJECTION, AND BILATERAL TAP BLOCK Patient Active Problem List   Diagnosis Date Noted   S/P left hemicolectomy 02/17/2022   Cancer of sigmoid colon ([place]) 01/25/2022   NSAID long-term use    Diverticulitis 11/01/2021   HTN (hypertension) 11/01/2021    Class: Chronic   HLD (hyperlipidemia) 11/01/2021    Class: Chronic   Hard of hearing 11/01/2021    Class: Chronic   Leukocytosis 11/01/2021    Class: Acute   Factor V Leiden ([place]) 11/01/2021    Class: Chronic   History of DVT (deep vein thrombosis) 11/01/2021   DM (diabetes mellitus), type 2 ([place]) 11/01/2021    Class: Chronic   Chronic kidney disease (CKD), stage III (moderate) ([place]) 11/01/2021    Class: Chronic   Peripheral neuropathy 11/01/2021   Colonic diverticular abscess 11/01/2021    Class: Acute   Abnormal CT scan, colon 10/31/2021   Iron deficiency anemia 10/31/2021    Expected post op course  Plan: Cont fulls as tolerated SLIVF's Ambulate in hall SCD's Oxycodone added  for better pain control Hgb stable after intraop transfusion.  Restart Elliquis today.  Recheck labs in AM   LOS: 2 days     .Rosario Adie, MD [facility], [place]    02/19/2022 9:02 AM

## 2022-02-20 ENCOUNTER — Other Ambulatory Visit: Payer: Self-pay

## 2022-02-20 LAB — CBC
HCT: 29.6 % — ABNORMAL LOW (ref 39.0–52.0)
Hemoglobin: 9.5 g/dL — ABNORMAL LOW (ref 13.0–17.0)
MCH: 31.9 pg (ref 26.0–34.0)
MCHC: 32.1 g/dL (ref 30.0–36.0)
MCV: 99.3 fL (ref 80.0–100.0)
Platelets: 188 10*3/uL (ref 150–400)
RBC: 2.98 MIL/uL — ABNORMAL LOW (ref 4.22–5.81)
RDW: 18.4 % — ABNORMAL HIGH (ref 11.5–15.5)
WBC: 13.8 10*3/uL — ABNORMAL HIGH (ref 4.0–10.5)
nRBC: 0 % (ref 0.0–0.2)

## 2022-02-20 LAB — BASIC METABOLIC PANEL
Anion gap: 6 (ref 5–15)
BUN: 23 mg/dL (ref 8–23)
CO2: 23 mmol/L (ref 22–32)
Calcium: 8.6 mg/dL — ABNORMAL LOW (ref 8.9–10.3)
Chloride: 105 mmol/L (ref 98–111)
Creatinine, Ser: 1.23 mg/dL (ref 0.61–1.24)
GFR, Estimated: 58 mL/min — ABNORMAL LOW (ref >60)
Glucose, Bld: 119 mg/dL — ABNORMAL HIGH (ref 70–99)
Potassium: 4.3 mmol/L (ref 3.5–5.1)
Sodium: 134 mmol/L — ABNORMAL LOW (ref 135–145)

## 2022-02-20 NOTE — Progress Notes (Signed)
3 Days Post-Op Robotic assisted partial colectomy Subjective: Feeling much better. In good spirits. Tolerating liquids including fulls (grits) without issue. Scant flatus, small bm.   Objective: Vital signs in last 24 hours: Temp:  [97.4 F (36.3 C)-98.5 F (36.9 C)] 98.5 F (36.9 C) (07/24 0512) Pulse Rate:  [62-67] 65 (07/24 0512) Resp:  [18] 18 (07/24 0512) BP: (145-157)/(59-69) 153/59 (07/24 0512) SpO2:  [93 %-95 %] 93 % (07/24 0512) Weight:  [94.8 kg] 94.8 kg (07/24 0500)   Intake/Output from previous day: 07/23 0701 - 07/24 0700 In: 1080 [P.O.:1080] Out: 1445 [Urine:1445] Intake/Output this shift: Total I/O In: 400 [P.O.:400] Out: -    General appearance: alert and cooperative GI: soft, nondistended  Incision: no significant drainage  Lab Results:  Recent Labs    02/19/22 0443 02/20/22 0434  WBC 15.4* 13.8*  HGB 9.2* 9.5*  HCT 28.5* 29.6*  PLT 193 188   BMET Recent Labs    02/19/22 0443 02/20/22 0434  NA 137 134*  K 4.3 4.3  CL 108 105  CO2 24 23  GLUCOSE 124* 119*  BUN 25* 23  CREATININE 1.27* 1.23  CALCIUM 8.7* 8.6*   PT/INR No results for input(s): "LABPROT", "INR" in the last 72 hours. ABG No results for input(s): "PHART", "HCO3" in the last 72 hours.  Invalid input(s): "PCO2", "PO2"  MEDS, Scheduled  acetaminophen  1,000 mg Oral Q6H   amitriptyline  10 mg Oral QPM   amLODipine  5 mg Oral BID   apixaban  5 mg Oral BID   ascorbic acid  1,000 mg Oral Daily   atorvastatin  40 mg Oral QPM   carvedilol  6.25 mg Oral BID WC   cholecalciferol  5,000 Units Oral QPM   dorzolamide  1 drop Left Eye BID   feeding supplement  237 mL Oral BID BM   ferrous sulfate  325 mg Oral Q breakfast   folic acid  1 mg Oral QPM   hydrALAZINE  50 mg Oral BID   irbesartan  300 mg Oral QPM   latanoprost  1 drop Both Eyes QHS   linagliptin  5 mg Oral Q breakfast   loratadine  10 mg Oral q morning   pantoprazole  40 mg Oral Daily   vitamin B-12  1,000 mcg  Oral Daily    Studies/Results: No results found.  Assessment: s/p Procedure(s): XI ROBOTIC ASSISTED LEFT HEMICOLECTOMY, TAKEDOWN OF SPLENIC FLEXURE, INTRAOPERATIVE ASSESSMENT OF PERFUSION USING FIREFLY INJECTION, AND BILATERAL TAP BLOCK Patient Active Problem List   Diagnosis Date Noted   S/P left hemicolectomy 02/17/2022   Cancer of sigmoid colon ([place]) 01/25/2022   NSAID long-term use    Diverticulitis 11/01/2021   HTN (hypertension) 11/01/2021    Class: Chronic   HLD (hyperlipidemia) 11/01/2021    Class: Chronic   Hard of hearing 11/01/2021    Class: Chronic   Leukocytosis 11/01/2021    Class: Acute   Factor V Leiden ([place]) 11/01/2021    Class: Chronic   History of DVT (deep vein thrombosis) 11/01/2021   DM (diabetes mellitus), type 2 ([place]) 11/01/2021    Class: Chronic   Chronic kidney disease (CKD), stage III (moderate) ([place]) 11/01/2021    Class: Chronic   Peripheral neuropathy 11/01/2021   Colonic diverticular abscess 11/01/2021    Class: Acute   Abnormal CT scan, colon 10/31/2021   Iron deficiency anemia 10/31/2021    Expected post op course  Plan: Soft diet as tolerated Ambulate, therapies to see SCD's  Oxycodone added for better pain control Back on Eliquis, hgb stable   LOS: 3 days    Herbert Landau, MD [facility], [facility]  02/20/2022 9:37 AM

## 2022-02-20 NOTE — Progress Notes (Signed)
Mobility Specialist - Progress Note    02/20/22 1000  Mobility  Activity Ambulated with assistance in hallway  Level of Assistance Standby assist, set-up cues, supervision of patient - no hands on  Assistive Device None  Distance Ambulated (ft) 300 ft  Activity Response Tolerated well  Transport method Ambulatory  $Mobility charge 1 Mobility   Patient was agreeable to be mobilized. Patient got up on their own and refused the gait belt. Patient ambulated in the hallway 380f. Pt took 2 breaks during ambulation. Pt was asked of abdominal pain and scored pain 1/10. Pt held on to side rails in the hallway and had shortness of breath while ambulating the last half towards EOS. Pt was left in chair with all necessities in reach and family in room.  BFerd HibbsMobility Specialist

## 2022-02-20 NOTE — Evaluation (Signed)
Occupational Therapy Evaluation Patient Details Name: Herbert Wood MRN: [id number] DOB: Dec 02, 1934 Today's Date: 02/20/2022   History of Present Illness Patient is a 87 year old male who presented to the hospital on 7/21 with colon cancer. patient underwent Robotic assisted left hemicolectomy and Takedown of splenic flexure  PMH:HTN, HLD, Factor V Leiden with hx of DVT,Iron deficiency anemia, GERD,   Clinical Impression   Patient evaluated by Occupational Therapy with no further acute OT needs identified. All education has been completed and the patient has no further questions. Patient is S for ADLs at this time with wife support at home. See below for any follow-up Occupational Therapy or equipment needs. OT is signing off. Thank you for this referral.       Recommendations for follow up therapy are one component of a multi-disciplinary discharge planning process, led by the attending physician.  Recommendations may be updated based on patient status, additional functional criteria and insurance authorization.   Follow Up Recommendations  No OT follow up    Assistance Recommended at Discharge Intermittent Supervision/Assistance  Patient can return home with the following A little help with bathing/dressing/bathroom;Assistance with cooking/housework;Assist for transportation;Direct supervision/assist for financial management;Help with stairs or ramp for entrance;Direct supervision/assist for medications management    Functional Status Assessment  Patient has had a recent decline in their functional status and demonstrates the ability to make significant improvements in function in a reasonable and predictable amount of time.  Equipment Recommendations  Other (comment) Management consultant)    Recommendations for Other Services       Precautions / Restrictions Precautions Precautions: Fall Restrictions Weight Bearing Restrictions: No      Mobility Bed Mobility               General  bed mobility comments: patient was up in recliner and returned to the same    Transfers                          Balance Overall balance assessment: Mild deficits observed, not formally tested                                         ADL either performed or assessed with clinical judgement   ADL Overall ADL's : Modified independent                                       General ADL Comments: patient was able to complete washing up at sink in bathroom in standing with no LOB. patient was able to change socks sitting on commode with increased time and SUP with education on bringing ankles to lap v.s. bending. patient's wife reported patient wears complression stockins at home. wife reported she would assist with those. patient was able to complete functional mobility in room with no AD with unsteadyness at times. patient reported pain was impacting gait. no LOB noted. patient and wife were educated on ECT at home. patient and wife verbalzied understanding.     Vision Patient Visual Report: No change from baseline       Perception     Praxis      Pertinent Vitals/Pain Pain Assessment Pain Assessment: Faces Faces Pain Scale: Hurts a little bit Pain Location: abdomen with movement Pain Descriptors / Indicators: Discomfort,  Grimacing Pain Intervention(s): Limited activity within patient's tolerance, Monitored during session, Repositioned     Hand Dominance Right   Extremity/Trunk Assessment Upper Extremity Assessment Upper Extremity Assessment: Overall WFL for tasks assessed   Lower Extremity Assessment Lower Extremity Assessment: Defer to PT evaluation   Cervical / Trunk Assessment Cervical / Trunk Assessment: Normal   Communication Communication Communication: No difficulties   Cognition Arousal/Alertness: Awake/alert Behavior During Therapy: WFL for tasks assessed/performed Overall Cognitive Status: Within Functional Limits  for tasks assessed                                 General Comments: wife was present as well. patient was [facility] with noted hearing aids in place     General Comments       Exercises     Shoulder Instructions      Home Living Family/patient expects to be discharged to:: Private residence Living Arrangements: Spouse/significant other Available Help at Discharge: Family;Available 24 hours/day Type of Home: House Home Access: Stairs to enter [employer] of Steps: 1 Entrance Stairs-Rails: None Home Layout: One level     Bathroom Shower/Tub: Teacher, early years/pre: Handicapped height     Home Equipment: Conservation officer, nature (2 wheels);Cane - single point          Prior Functioning/Environment Prior Level of Function : Independent/Modified Independent             Mobility Comments: Household and short distanced community ambulator, drives ADLs Comments: Independent        OT Problem List:        OT Treatment/Interventions:      OT Goals(Current goals can be found in the care plan section) Acute Rehab OT Goals OT Goal Formulation: All assessment and education complete, DC therapy  OT Frequency:      Co-evaluation              AM-PAC OT "6 Clicks" Daily Activity     Outcome Measure Help from another person eating meals?: None Help from another person taking care of personal grooming?: None Help from another person toileting, which includes using toliet, bedpan, or urinal?: A Little Help from another person bathing (including washing, rinsing, drying)?: A Little Help from another person to put on and taking off regular upper body clothing?: A Little Help from another person to put on and taking off regular lower body clothing?: A Little 6 Click Score: 20   End of Session Nurse Communication: Mobility status  Activity Tolerance: Patient tolerated treatment well Patient left: in chair;with call bell/phone within reach;with  family/visitor present  OT Visit Diagnosis: Unsteadiness on feet (R26.81)                Time: 8416-6063 OT Time Calculation (min): 25 min Charges:  OT General Charges $OT Visit: 1 Visit OT Evaluation $OT Eval Low Complexity: 1 Low OT Treatments $Self Care/Home Management : 8-22 mins  Jackelyn Poling OTR/L, MS Acute Rehabilitation Department Office# [phone] Pager# [phone]   Marcellina Millin 02/20/2022, 11:58 AM

## 2022-02-20 NOTE — Evaluation (Signed)
Physical Therapy Evaluation Patient Details Name: Herbert Wood MRN: [id number] DOB: 02-01-1935 Today's Date: 02/20/2022  History of Present Illness  Patient is a 87 year old male who presented to the hospital on 7/21 with colon cancer. patient underwent Robotic assisted left hemicolectomy and Takedown of splenic flexure  PMH:HTN, HLD, Factor V Leiden with hx of DVT,Iron deficiency anemia, GERD,  Clinical Impression  On eval, pt was Supv-Min guard A for mobility. He walked ~250 feet without a device. Pt is unsteady but no overt LOB. Plan is for pt to return home where he lives with his wife. He politely declines PT f/u after discharge. He has access to DME at home if it is needed. Will plan to follow pt during this hospital stay.        Recommendations for follow up therapy are one component of a multi-disciplinary discharge planning process, led by the attending physician.  Recommendations may be updated based on patient status, additional functional criteria and insurance authorization.  Follow Up Recommendations No PT follow up      Assistance Recommended at Discharge PRN  Patient can return home with the following       Equipment Recommendations None recommended by PT  Recommendations for Other Services       Functional Status Assessment Patient has had a recent decline in their functional status and demonstrates the ability to make significant improvements in function in a reasonable and predictable amount of time.     Precautions / Restrictions Precautions Precautions: Fall Restrictions Weight Bearing Restrictions: No      Mobility  Bed Mobility Overal bed mobility: Needs Assistance Bed Mobility: Rolling, Sidelying to Sit Rolling: Supervision Sidelying to sit: Supervision, HOB elevated       General bed mobility comments: Increased time. No physical assistance provided.    Transfers Overall transfer level: Needs assistance Equipment used: None Transfers: Sit  to/from Stand Sit to Stand: Supervision           General transfer comment: Supv for safety. Increased time 2* pt has chronic issues with lightheadedness if he moves too fast    Ambulation/Gait Ambulation/Gait assistance: Min guard Gait Distance (Feet): 250 Feet Assistive device: None Gait Pattern/deviations: Step-through pattern, Decreased stride length       General Gait Details: Unsteady at times but no overt LOB. He occasionally reached out for hallway handrail. Dyspnea 2/4-pt reports this is not new for him  Financial trader Rankin (Stroke Patients Only)       Balance Overall balance assessment: Mild deficits observed, not formally tested                                           Pertinent Vitals/Pain Pain Assessment Pain Assessment: 0-10 Pain Score: 7  Pain Location: abdomen with movement Pain Descriptors / Indicators: Discomfort, Grimacing Pain Intervention(s): Limited activity within patient's tolerance, Monitored during session, Repositioned    Home Living Family/patient expects to be discharged to:: Private residence Living Arrangements: Spouse/significant other Available Help at Discharge: Family;Available 24 hours/day Type of Home: House Home Access: Stairs to enter Entrance Stairs-Rails: None Entrance Stairs-Number of Steps: 1   Home Layout: One level Home Equipment: Conservation officer, nature (2 wheels);Cane - single point      Prior Function Prior Level of Function : Independent/Modified Independent  Mobility Comments: Household and short distanced community ambulator, drives ADLs Comments: Independent     Hand Dominance   Dominant Hand: Right    Extremity/Trunk Assessment   Upper Extremity Assessment Upper Extremity Assessment: Defer to OT evaluation    Lower Extremity Assessment Lower Extremity Assessment: Generalized weakness    Cervical / Trunk Assessment Cervical  / Trunk Assessment: Normal  Communication   Communication: No difficulties  Cognition Arousal/Alertness: Awake/alert Behavior During Therapy: WFL for tasks assessed/performed Overall Cognitive Status: Within Functional Limits for tasks assessed                                 General Comments: wife was present as well. patient was [facility] with noted hearing aids in place        General Comments      Exercises     Assessment/Plan    PT Assessment Patient needs continued PT services  PT Problem List Decreased balance       PT Treatment Interventions DME instruction;Gait training;Functional mobility training;Therapeutic activities;Balance training;Therapeutic exercise    PT Goals (Current goals can be found in the Care Plan section)  Acute Rehab PT Goals Patient Stated Goal: home PT Goal Formulation: With patient Time For Goal Achievement: 03/06/22 Potential to Achieve Goals: Good    Frequency Min 3X/week     Co-evaluation               AM-PAC PT "6 Clicks" Mobility  Outcome Measure Help needed turning from your back to your side while in a flat bed without using bedrails?: None Help needed moving from lying on your back to sitting on the side of a flat bed without using bedrails?: None Help needed moving to and from a bed to a chair (including a wheelchair)?: A Little Help needed standing up from a chair using your arms (e.g., wheelchair or bedside chair)?: None Help needed to walk in hospital room?: A Little Help needed climbing 3-5 steps with a railing? : A Little 6 Click Score: 21    End of Session Equipment Utilized During Treatment: Gait belt Activity Tolerance: Patient tolerated treatment well Patient left: in chair;with call bell/phone within reach;with family/visitor present   PT Visit Diagnosis: Unsteadiness on feet (R26.81)    Time: 6384-5364 PT Time Calculation (min) (ACUTE ONLY): 16 min   Charges:   PT Evaluation $PT Eval Low  Complexity: [place], PT Acute Rehabilitation  Office: [phone] Pager: [phone]

## 2022-02-21 MED ORDER — ACETAMINOPHEN 500 MG PO TABS: 1000.0000 mg | ORAL_TABLET | Freq: Three times a day (TID) | ORAL | 0 refills | Status: DC | PRN

## 2022-02-21 MED ORDER — TRAMADOL HCL 50 MG PO TABS
50.0000 mg | ORAL_TABLET | Freq: Four times a day (QID) | ORAL | 0 refills | Status: DC | PRN
Start: 2022-02-21 — End: 2022-07-20

## 2022-02-21 NOTE — Discharge Instructions (Addendum)
[facility], [place] [phone]  OPEN ABDOMINAL SURGERY: POST OP INSTRUCTIONS  Always review your discharge instruction sheet given to you by the facility where your surgery was performed.  IF YOU HAVE DISABILITY OR FAMILY LEAVE FORMS, YOU MUST BRING THEM TO THE OFFICE FOR PROCESSING.  PLEASE DO NOT GIVE THEM TO YOUR DOCTOR.  A prescription for pain medication may be given to you upon discharge.  Take your pain medication as prescribed, if needed.  If narcotic pain medicine is not needed, then you may take acetaminophen (Tylenol) as needed. You should not take NSAIDs such as Ibuprofen since you are on Eliquis. You cannot drive or operate heavy machinery while taking narcotic pain medications like Oxycodone, Ultram or Tramadol.  Take your usually prescribed medications unless otherwise directed. If you need a refill on your pain medication, please contact your pharmacy. They will contact our office to request authorization.  Prescriptions will not be filled after 5pm or on week-ends. You should follow a light diet the first few days after arrival home, such as soup and crackers, pudding, etc.unless your doctor has advised otherwise. Be sure to include lots of fluids daily. Most patients will experience some swelling and bruising on the chest and neck area.  Ice packs will help.  Swelling and bruising can take several days to resolve Most patients will experience some swelling and bruising in the area of the incision. Ice pack will help. Swelling and bruising can take several days to resolve..  It is common to experience some constipation if taking pain medication after surgery.  Increasing fluid intake and taking a stool softener will usually help or prevent this problem from occurring.  A mild laxative (Milk of Magnesia or Miralax) should be taken according to package directions if there are no bowel movements after 48 hours. Your incisions was closed with skin glue. You may shower  in 24 hours.  The glue will flake off over the next 2-3 weeks. ACTIVITIES:  You may resume regular (light) daily activities beginning the next day--such as daily self-care, walking, climbing stairs--gradually increasing activities as tolerated.  You may have sexual intercourse when it is comfortable.  Refrain from any heavy lifting or straining until approved by your doctor. You may drive when you no longer are taking prescription pain medication, you can comfortably wear a seatbelt, and you can safely maneuver your car and apply brakes You should see your doctor in the office for a follow-up appointment approximately two weeks after your surgery.  Make sure that you call for this appointment within a day or two after you arrive home to insure a convenient appointment time.   WHEN TO CALL YOUR DOCTOR: Fever over 101.0 Inability to urinate Nausea and/or vomiting Extreme swelling or bruising Continued bleeding from incision. Increased pain, redness, or drainage from the incision. Difficulty swallowing or breathing Muscle cramping or spasms. Numbness or tingling in hands or feet or around lips.  The clinic staff is available to answer your questions during regular business hours.  Please don't hesitate to call and ask to speak to one of the nurses if you have concerns.  For further questions, please visit [email or web address]

## 2022-02-21 NOTE — Discharge Summary (Signed)
Patient ID: Herbert Wood [id number] 08/31/1934 87 y.o.  Admit date: 02/17/2022 Discharge date: 02/21/2022   Discharge Diagnosis S/p Robotic assisted left hemicolectomy, takedown of splenic flexure by Dr. 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 male with history of HTN, HLD, Factor V Leiden with hx of DVT in past (on Eliquis), Iron deficiency anemia, GERD, whom was seen in the office 6/21 as a referral by Dr. Montez Morita for evaluation of recently diagnosed splenic flexure colon cancer.  CT A/P 11/01/21 for abdominal pain suggested acute diverticulitis at the splenic flexure with a 3.5 cm "intramural abscess." No obvious colonic masses were seen.  He underwent colonoscopy with Dr. Jenetta Downer 01/10/22: 2 sessile polyps in transverse colon - removed - Fungating, ulcerated, partially obstructing mass at 42 cm from the anus. This was circumferential. Measured 3 cm in length. No bleeding. Biopsied and area distally was then tattooed. Pediatric scope was able to get past this lesion.  PATH: D. COLON, TRANSVERSE, POLYPECTOMY:  - Fragments of at least intramucosal adenocarcinoma and high-grade  dysplasia, see comment  - Separate fragment of a tubular adenoma without high-grade dysplasia   E. COLON, DESCENDING, MASS, BIOPSY:  - Invasive moderately differentiated adenocarcinoma, see comment   Staging CT chest/abdomen/pelvis 01/13/2022: 1. No evidence of metastatic disease in the chest, abdomen, or pelvis. 2. Focal area of thickening of the splenic flexure and proximal descending colon which has improved since the prior CT likely representing  improvement of the previously seen diverticulitis. Underlying mass is not excluded. Correlation with colon cancer screening markers recommended. Colonoscopy may provide better evaluation if clinically indicated. 3. Several tiny pulmonary nodules as well as scattered areas of interstitial irregularity in the right upper lobe, likely post inflammatory. No suspicious nodule. Attention on follow-up imaging recommended. 4. Aortic Atherosclerosis (ICD10-I70.0).  I spoke with Dr. Montez Morita 6/21 and reviewed colonoscopy images... He had spoken with pathology and the transverse colon polyp being benign with findings noted in report secondary to scope channel contamination and not a separate cancerous polyp  He reports he has been doing well. He denies any abdominal pain. He did have some abdominal pain when he first had this unusual "diverticulitis" of the splenic flexure. He has had none since that time. He has a good appetite. No nausea or vomiting. He has had an unintentional approximate 25 pound weight loss over the last 4 to 6 months. He is here today with his wife. Generally does not have bright red blood per rectum except for when he took his colonoscopy prep.   Denies any changes in his health or health history since we met in the office. Has been off his Eliquis since 7/16 as per his rheumatologist (not cardiologist as we initially thought). Tolerated prep with satisfactory result. Denies any complaints at present, states he is ready for surgery.    Procedures Dr. Dema Severin - 02/17/22 Robotic assisted left hemicolectomy Takedown of splenic flexure Intraoperative assessment of perfusion using ICG fluorescence imaging Bilateral transversus abdominus plane (TAP) blocks  Hospital Course:  Patient presented on 7/21 for planned procedure for splenic flexure colon cancer. He underwent above procedure  and tolerated this well. Transferred to the floor. Foley d/c'd POD 1. Mild ileus post op that  resolved and diet was advanced and tolerated. Hgb stabilized post op and home Eliquis was restarted. Patient worked with therapies who recommended no f/u. On POD4, the patient was voiding well, tolerating diet, ambulating well, pain well controlled, vital signs stable, incisions c/d/i and felt stable for discharge home. Discussed discharge instructions, restrictions and return/call back precautions.   Allergies as of 02/21/2022       Reactions   Gadobenate Nausea And Vomiting   Immediately upon the infusion of 73m multihance contrast  Patient had exteme nausea and vomiting.   No other symptoms noted .  No injury.  MRI scan was completed after patient felt better.   Immediately upon the infusion of 146mmultihance contrast  Patient had exteme nausea and vomiting.   No other symptoms noted .  No injury.  MRI scan was completed after patient felt better.          Medication List     STOP taking these medications    metroNIDAZOLE 500 MG tablet Commonly known as: FLAGYL       TAKE these medications    acetaminophen 500 MG tablet Commonly known as: TYLENOL Take 2 tablets (1,000 mg total) by mouth every 8 (eight) hours as needed.   albuterol 108 (90 Base) MCG/ACT inhaler Commonly known as: VENTOLIN HFA Inhale 1-2 puffs into the lungs every 6 (six) hours as needed for wheezing or shortness of breath.   amitriptyline 10 MG tablet Commonly known as: ELAVIL Take 10 mg by mouth every evening.   amLODipine 5 MG tablet Commonly known as: NORVASC Take 5 mg by mouth 2 (two) times daily.   apixaban 5 MG Tabs tablet Commonly known as: ELIQUIS Take 5 mg by mouth 2 (two) times daily.   atorvastatin 40 MG tablet Commonly known as: LIPITOR Take 40 mg by mouth every evening.   bisacodyl 5 MG EC tablet Generic drug: bisacodyl Take 10 mg by mouth as directed.   carvedilol 6.25 MG tablet Commonly known as: COREG Take 6.25 mg by mouth 2 (two) times daily with a meal.   dorzolamide 2 %  ophthalmic solution Commonly known as: TRUSOPT Place 1 drop into the left eye 2 (two) times daily.   ferrous sulfate 325 (65 FE) MG tablet Take 325 mg by mouth in the morning and at bedtime. Iron Nature's Bounty   FOLIC ACID PO Take 80109cg by mouth every evening. With B12 Now (Brand)   hydrALAZINE 25 MG tablet Commonly known as: APRESOLINE Take 50 mg by mouth in the morning and at bedtime.   irbesartan 300 MG tablet Commonly known as: AVAPRO Take 300 mg by mouth every evening.   latanoprost 0.005 % ophthalmic solution Commonly known as: XALATAN Place 1 drop into both eyes at bedtime.   linagliptin 5 MG Tabs tablet Commonly known as: TRADJENTA Take 5 mg by mouth in the morning.   loratadine 10 MG tablet Commonly known as: CLARITIN Take 10 mg by mouth every morning.   LORazepam 0.5 MG tablet Commonly known as: ATIVAN Take 0.5 mg by mouth 2 (two) times daily as needed for anxiety.   omeprazole 40 MG capsule Commonly known as: PRILOSEC Take 1 capsule (40 mg total) by mouth daily.   PROSTATE SUPPORT PO Take 1 capsule by mouth every evening. NOW Prostate Support   traMADol 50 MG tablet Commonly known as: ULTRAM Take 1 tablet (50 mg  total) by mouth every 6 (six) hours as needed (breakthrough pain).   vitamin B-12 1000 MCG tablet Commonly known as: CYANOCOBALAMIN Take 1,000 mcg by mouth daily. Now (Brand)   Vitamin C/Natural Rose Hips 1000 MG tablet Generic drug: ascorbic acid Take 1,000 mg by mouth every evening.   Vitamin D3 125 MCG (5000 UT) Caps Take 5,000 Units by mouth every evening.          Follow-up Information     Derek Jack, MD. Schedule an appointment as soon as possible for a visit.   Specialty: Hematology Why: For follow up Contact information: [place] [address] [phone]         Ileana Roup, MD Follow up.   Specialties: General Surgery, Colon and Rectal Surgery Why: Please call to confirm your  appointment date and time. We are working hard to make this for you. Please bring a copy of your photo ID and insurance card. Arrive 30 minutes prior to your appointment for paperwork. Contact information: [place] [address] [phone]                 Signed: Alferd Apa, [facility] 02/21/2022, 10:46 AM Please see Amion for pager number during day hours 7:00am-4:30pm

## 2022-02-21 NOTE — Plan of Care (Signed)
  Problem: Education: Goal: Understanding of discharge needs will improve Outcome: Completed/Met Goal: Verbalization of understanding of the causes of altered bowel function will improve Outcome: Completed/Met   Problem: Activity: Goal: Ability to tolerate increased activity will improve Outcome: Completed/Met   Problem: Bowel/Gastric: Goal: Gastrointestinal status for postoperative course will improve Outcome: Completed/Met   Problem: Health Behavior/Discharge Planning: Goal: Identification of community resources to assist with postoperative recovery needs will improve Outcome: Completed/Met   Problem: Nutritional: Goal: Will attain and maintain optimal nutritional status will improve Outcome: Completed/Met   Problem: Clinical Measurements: Goal: Postoperative complications will be avoided or minimized Outcome: Completed/Met   Problem: Respiratory: Goal: Respiratory status will improve Outcome: Completed/Met   Problem: Skin Integrity: Goal: Will show signs of wound healing Outcome: Completed/Met   Problem: Education: Goal: Knowledge of General Education information will improve Description: Including pain rating scale, medication(s)/side effects and non-pharmacologic comfort measures Outcome: Completed/Met   Problem: Health Behavior/Discharge Planning: Goal: Ability to manage health-related needs will improve Outcome: Completed/Met   Problem: Clinical Measurements: Goal: Ability to maintain clinical measurements within normal limits will improve Outcome: Completed/Met Goal: Will remain free from infection Outcome: Completed/Met Goal: Diagnostic test results will improve Outcome: Completed/Met Goal: Respiratory complications will improve Outcome: Completed/Met Goal: Cardiovascular complication will be avoided Outcome: Completed/Met   Problem: Activity: Goal: Risk for activity intolerance will decrease Outcome: Completed/Met   Problem: Nutrition: Goal:  Adequate nutrition will be maintained Outcome: Completed/Met   Problem: Coping: Goal: Level of anxiety will decrease Outcome: Completed/Met   Problem: Elimination: Goal: Will not experience complications related to bowel motility Outcome: Completed/Met Goal: Will not experience complications related to urinary retention Outcome: Completed/Met   Problem: Pain Managment: Goal: General experience of comfort will improve Outcome: Completed/Met   Problem: Safety: Goal: Ability to remain free from injury will improve Outcome: Completed/Met   Problem: Skin Integrity: Goal: Risk for impaired skin integrity will decrease Outcome: Completed/Met

## 2022-02-21 NOTE — Progress Notes (Signed)
Mobility Specialist - Progress Note     02/21/22 0900  Mobility  Activity Ambulated with assistance in hallway  Level of Assistance Standby assist, set-up cues, supervision of patient - no hands on  Assistive Device None  Distance Ambulated (ft) 310 ft  Activity Response Tolerated well  Transport method Ambulatory  $Mobility charge 1 Mobility   Pt was agreeable to be mobilized. Pt got out of bed independently. Pt ambulated in hallway 36f. When asked about abdominal pain Pt said it was 2/10. After returning to room Pt was left on chair with all necessities in reach and with family in room.  BFerd HibbsMobility Specialist

## 2022-02-21 NOTE — Progress Notes (Addendum)
4 Days Post-Op  Subjective: CC: Doing well. Some soreness around his incision that is well controlled with current medications. Tolerating soft diet and finished all of breakfast. No n/v. Passing flatus. BM x1 yesterday. Voiding. Worked with PT/OT. Walked ~217f without a device with therapies + 3074fwith mobility tech. No PT/OT f/u recommended. Plans to stay with wife after d/c.   Objective: Vital signs in last 24 hours: Temp:  [97.5 F (36.4 C)-98.6 F (37 C)] 98 F (36.7 C) (07/25 0437) Pulse Rate:  [66-73] 66 (07/25 0437) Resp:  [16-18] 16 (07/25 0437) BP: (130-163)/(56-59) 152/57 (07/25 0437) SpO2:  [94 %-96 %] 95 % (07/25 0437) Weight:  [96.4 kg] 96.4 kg (07/25 0500) Last BM Date : 02/20/22  Intake/Output from previous day: 07/24 0701 - 07/25 0700 In: 1240 [P.O.:1240] Out: 1150 [Urine:1150] Intake/Output this shift: No intake/output data recorded.  PE: Gen:  Alert, NAD, pleasant Card:  Reg Pulm:  CTAB, no W/R/R, effort normal Abd: Soft, protuberant, appropriately ttp around incisions - otherwise NT. No peritonitis. Incisions with glue intact appears well and are without drainage, bleeding, or signs of infection Ext:  No LE edema  Lab Results:  Recent Labs    02/19/22 0443 02/20/22 0434  WBC 15.4* 13.8*  HGB 9.2* 9.5*  HCT 28.5* 29.6*  PLT 193 188   BMET Recent Labs    02/19/22 0443 02/20/22 0434  NA 137 134*  K 4.3 4.3  CL 108 105  CO2 24 23  GLUCOSE 124* 119*  BUN 25* 23  CREATININE 1.27* 1.23  CALCIUM 8.7* 8.6*   PT/INR No results for input(s): "LABPROT", "INR" in the last 72 hours. CMP     Component Value Date/Time   NA 134 (L) 02/20/2022 0434   K 4.3 02/20/2022 0434   CL 105 02/20/2022 0434   CO2 23 02/20/2022 0434   GLUCOSE 119 (H) 02/20/2022 0434   BUN 23 02/20/2022 0434   CREATININE 1.23 02/20/2022 0434   CALCIUM 8.6 (L) 02/20/2022 0434   PROT 6.4 (L) 02/17/2022 0940   ALBUMIN 3.6 02/17/2022 0940   AST 17 02/17/2022 0940    ALT 18 02/17/2022 0940   ALKPHOS 79 02/17/2022 0940   BILITOT 1.0 02/17/2022 0940   GFRNONAA 58 (L) 02/20/2022 0434   Lipase     Component Value Date/Time   LIPASE 29 11/01/2021 1023    Studies/Results: No results found.  Anti-infectives: Anti-infectives (From admission, onward)    Start     Dose/Rate Route Frequency Ordered Stop   02/17/22 1400  neomycin (MYCIFRADIN) tablet 1,000 mg  Status:  Discontinued       See Hyperspace for full Linked Orders Report.   1,000 mg Oral 3 times per day 02/17/22 0940 02/17/22 0943   02/17/22 1400  metroNIDAZOLE (FLAGYL) tablet 1,000 mg  Status:  Discontinued       See Hyperspace for full Linked Orders Report.   1,000 mg Oral 3 times per day 02/17/22 0940 02/17/22 0943   02/17/22 0945  cefoTEtan (CEFOTAN) 2 g in sodium chloride 0.9 % 100 mL IVPB        2 g 200 mL/hr over 30 Minutes Intravenous On call to O.R. 02/17/22 0940 02/17/22 1115        Assessment/Plan POD 4 s/p Robotic assisted left hemicolectomy, takedown of splenic flexure by Dr. 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 Already followed by  Dr. Delton Coombes of Oncology.  - Seems to be doing well from a post surgical standpoint. He is voiding well, tolerating diet, ambulating well, pain well controlled, vital signs stable, hgb stable after resuming Eliquis and incisions c/d/I. Suspect he will be able to be d/c'd today with f/u with Dr. Dema Severin in the office. Discussed discharge instructions, restrictions and return/call back precautions with patient and his wife. Will confirm with Dr. Dema Severin before placing d/c order.   Addendum: Discussed with Dr. Dema Severin. Will plan for d/c today.   FEN - Soft VTE - SCDs, Eliquis (started 7/23) ID - Perio-op abx. None currently. Afebrile. No tachycardia or hypotension. WBC downtrending Foley - None, good uop (0.48m/kg/hr +  unmeasured x 3) Plan - Possible d/c today?  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

## 2022-02-22 ENCOUNTER — Other Ambulatory Visit (HOSPITAL_COMMUNITY): Payer: Self-pay

## 2022-02-23 LAB — SURGICAL PATHOLOGY

## 2022-03-01 ENCOUNTER — Other Ambulatory Visit: Payer: Self-pay

## 2022-03-01 NOTE — Progress Notes (Signed)
The proposed treatment discussed in conference is for discussion purpose only and is not a binding recommendation.  The patients have not been physically examined, or presented with their treatment options.  Therefore, final treatment plans cannot be decided.  

## 2022-03-09 ENCOUNTER — Inpatient Hospital Stay (HOSPITAL_COMMUNITY): Payer: Medicare Other

## 2022-03-09 ENCOUNTER — Inpatient Hospital Stay (HOSPITAL_COMMUNITY): Payer: Medicare Other | Admitting: Hematology

## 2022-03-16 ENCOUNTER — Inpatient Hospital Stay (HOSPITAL_BASED_OUTPATIENT_CLINIC_OR_DEPARTMENT_OTHER): Payer: Medicare Other | Admitting: Hematology

## 2022-03-16 ENCOUNTER — Inpatient Hospital Stay: Payer: Medicare Other | Attending: Hematology

## 2022-03-16 VITALS — BP 134/50 | HR 49 | Temp 97.7°F | Resp 18 | Wt 194.7 lb

## 2022-03-16 DIAGNOSIS — Z87891 Personal history of nicotine dependence: Secondary | ICD-10-CM | POA: Insufficient documentation

## 2022-03-16 DIAGNOSIS — I10 Essential (primary) hypertension: Secondary | ICD-10-CM | POA: Diagnosis not present

## 2022-03-16 DIAGNOSIS — Z86718 Personal history of other venous thrombosis and embolism: Secondary | ICD-10-CM | POA: Insufficient documentation

## 2022-03-16 DIAGNOSIS — D509 Iron deficiency anemia, unspecified: Secondary | ICD-10-CM | POA: Insufficient documentation

## 2022-03-16 DIAGNOSIS — Z7901 Long term (current) use of anticoagulants: Secondary | ICD-10-CM | POA: Insufficient documentation

## 2022-03-16 DIAGNOSIS — Z8052 Family history of malignant neoplasm of bladder: Secondary | ICD-10-CM | POA: Insufficient documentation

## 2022-03-16 DIAGNOSIS — E119 Type 2 diabetes mellitus without complications: Secondary | ICD-10-CM | POA: Insufficient documentation

## 2022-03-16 DIAGNOSIS — C187 Malignant neoplasm of sigmoid colon: Secondary | ICD-10-CM

## 2022-03-16 DIAGNOSIS — Z808 Family history of malignant neoplasm of other organs or systems: Secondary | ICD-10-CM | POA: Diagnosis not present

## 2022-03-16 DIAGNOSIS — N183 Chronic kidney disease, stage 3 unspecified: Secondary | ICD-10-CM | POA: Diagnosis not present

## 2022-03-16 LAB — CBC WITH DIFFERENTIAL/PLATELET
Abs Immature Granulocytes: 0.02 10*3/uL (ref 0.00–0.07)
Basophils Absolute: 0 10*3/uL (ref 0.0–0.1)
Basophils Relative: 1 %
Eosinophils Absolute: 0.1 10*3/uL (ref 0.0–0.5)
Eosinophils Relative: 2 %
HCT: 31.2 % — ABNORMAL LOW (ref 39.0–52.0)
Hemoglobin: 9.9 g/dL — ABNORMAL LOW (ref 13.0–17.0)
Immature Granulocytes: 0 %
Lymphocytes Relative: 15 %
Lymphs Abs: 0.9 10*3/uL (ref 0.7–4.0)
MCH: 31.5 pg (ref 26.0–34.0)
MCHC: 31.7 g/dL (ref 30.0–36.0)
MCV: 99.4 fL (ref 80.0–100.0)
Monocytes Absolute: 0.6 10*3/uL (ref 0.1–1.0)
Monocytes Relative: 10 %
Neutro Abs: 4.4 10*3/uL (ref 1.7–7.7)
Neutrophils Relative %: 72 %
Platelets: 187 10*3/uL (ref 150–400)
RBC: 3.14 MIL/uL — ABNORMAL LOW (ref 4.22–5.81)
RDW: 16.2 % — ABNORMAL HIGH (ref 11.5–15.5)
WBC: 6 10*3/uL (ref 4.0–10.5)
nRBC: 0 % (ref 0.0–0.2)

## 2022-03-16 LAB — COMPREHENSIVE METABOLIC PANEL
ALT: 34 U/L (ref 0–44)
AST: 27 U/L (ref 15–41)
Albumin: 3.6 g/dL (ref 3.5–5.0)
Alkaline Phosphatase: 87 U/L (ref 38–126)
Anion gap: 5 (ref 5–15)
BUN: 37 mg/dL — ABNORMAL HIGH (ref 8–23)
CO2: 25 mmol/L (ref 22–32)
Calcium: 9.1 mg/dL (ref 8.9–10.3)
Chloride: 111 mmol/L (ref 98–111)
Creatinine, Ser: 1.38 mg/dL — ABNORMAL HIGH (ref 0.61–1.24)
GFR, Estimated: 50 mL/min — ABNORMAL LOW (ref >60)
Glucose, Bld: 126 mg/dL — ABNORMAL HIGH (ref 70–99)
Potassium: 4.1 mmol/L (ref 3.5–5.1)
Sodium: 141 mmol/L (ref 135–145)
Total Bilirubin: 0.7 mg/dL (ref 0.3–1.2)
Total Protein: 6.8 g/dL (ref 6.5–8.1)

## 2022-03-16 NOTE — Patient Instructions (Addendum)
[place] at [facility] Discharge Instructions   You were seen and examined today by Dr. Delton Coombes.  He reviewed the results of your lab work. Your hemoglobin has improved. All other results are normal/stable.   We will see you back in 4 months. We will repeat at CT scan and lab work prior your next visit.    Thank you for choosing [place] at [facility] to provide your oncology and hematology care.  To afford each patient quality time with our provider, please arrive at least 15 minutes before your scheduled appointment time.   If you have a lab appointment with the [facility] please come in thru the Main Entrance and check in at the main information desk.  You need to re-schedule your appointment should you arrive 10 or more minutes late.  We strive to give you quality time with our providers, and arriving late affects you and other patients whose appointments are after yours.  Also, if you no show three or more times for appointments you may be dismissed from the clinic at the providers discretion.     Again, thank you for choosing [facility].  Our hope is that these requests will decrease the amount of time that you wait before being seen by our physicians.       _____________________________________________________________  Should you have questions after your visit to [facility], please contact our office at [phone] and follow the prompts.  Our office hours are 8:00 a.m. and 4:30 p.m. Monday - Friday.  Please note that voicemails left after 4:00 p.m. may not be returned until the following business day.  We are closed weekends and major holidays.  You do have access to a nurse 24-7, just call the main number to the clinic [phone] and do not press any options, hold on the line and a nurse will answer the phone.    For prescription refill requests, have your pharmacy contact our office and allow 72  hours.    Due to Covid, you will need to wear a mask upon entering the hospital. If you do not have a mask, a mask will be given to you at the Main Entrance upon arrival. For doctor visits, patients may have 1 support person age 79 or older with them. For treatment visits, patients can not have anyone with them due to social distancing guidelines and our immunocompromised population.

## 2022-03-16 NOTE — Progress Notes (Signed)
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  PRIOR THERAPY: Left hemicolectomy on 02/17/2022.  NGS Results: MMR preserved.  CURRENT THERAPY: Active surveillance.  BRIEF ONCOLOGIC HISTORY:  Oncology History  Cancer of sigmoid colon ([place])  01/25/2022 Initial Diagnosis   Cancer of sigmoid colon ([place])   01/25/2022 Cancer Staging   Staging form: Colon and Rectum, AJCC 8th Edition - Clinical stage from 01/25/2022: Stage IIA (cT3, cN0, cM0) - Signed by Derek Jack, MD on 03/16/2022 Histopathologic type: Adenocarcinoma, NOS Stage prefix: Initial diagnosis Total positive nodes: 0 Total nodes examined: 21 Histologic grade (G): G2 Histologic grading system: 4 grade system Microsatellite instability (MSI): Stable     CANCER STAGING: Cancer Staging  Cancer of sigmoid colon [facility]) Staging form: Colon and Rectum, AJCC 8th Edition - Clinical stage from 01/25/2022: Stage IIA (cT3, cN0, cM0) - Signed by Derek Jack, MD on 03/16/2022    INTERVAL HISTORY:  Herbert Wood 87 y.o. male returns for follow-up after his recent left hemicolectomy.  Energy levels are improving well.  Denies any abdominal pains.  Appetite is also improving.  He started taking iron supplements.  Appetite is 100% and energy levels are 50%.    REVIEW OF SYSTEMS:  Review of Systems  All other systems reviewed and are negative.    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   Murmur    Osteoarthritis    Senile purpura ([place])    Past Surgical History:  Procedure Laterality Date   BIOPSY  01/10/2022   Procedure: BIOPSY;  Surgeon: Harvel Quale, MD;  [facility];  Service: Gastroenterology;;   COLONOSCOPY WITH PROPOFOL N/A 01/10/2022   Procedure: COLONOSCOPY WITH PROPOFOL;  Surgeon: Harvel Quale, MD;  [facility];  Service: Gastroenterology;  Laterality: N/A;  per Darius Bump, pt to arrive at 9:15   ESOPHAGOGASTRODUODENOSCOPY (EGD) WITH PROPOFOL N/A 01/10/2022   Procedure: ESOPHAGOGASTRODUODENOSCOPY (EGD) WITH PROPOFOL;  Surgeon: Harvel Quale, MD;  [facility]: [facility];  Service: Gastroenterology;  Laterality: N/A;   HAND / FINGER LESION EXCISION     HERNIA REPAIR     MASTOIDECTOMY     PILONIDAL CYST EXCISION     POLYPECTOMY  01/10/2022   Procedure: POLYPECTOMY;  Surgeon: Harvel Quale, MD;  [facility]: [facility];  Service: Gastroenterology;;   SUBMUCOSAL TATTOO INJECTION  01/10/2022   Procedure: SUBMUCOSAL TATTOO INJECTION;  Surgeon: Harvel Quale, MD;  [facility];  Service: Gastroenterology;;   SURGERY OF LIP     skin cancer     SOCIAL HISTORY:  Social History   Socioeconomic History   Marital status: Married  Spouse name: Not on file   Number of children: Not on file   Years of education: Not on file   Highest education level: Not on file  Occupational History   Not on file  Tobacco Use   Smoking status: Former    Packs/day: 2.00    Years: 25.00    Total pack years: 50.00    Types: Cigarettes    Quit date: 40    Years since quitting: 28.6    Passive exposure: Past   Smokeless tobacco: Never  Vaping Use   Vaping Use: Never used  Substance and Sexual Activity   Alcohol use: Never   Drug use: Never   Sexual activity: Not  on file  Other Topics Concern   Not on file  Social History Narrative   Not on file   Social Determinants of Health   Financial Resource Strain: Not on file  Food Insecurity: Not on file  Transportation Needs: Not on file  Physical Activity: Not on file  Stress: Not on file  Social Connections: Not on file  Intimate Partner Violence: Not on file    FAMILY HISTORY:  No family history on file.  CURRENT MEDICATIONS:  Outpatient Encounter Medications as of 03/16/2022  Medication Sig Note   acetaminophen (TYLENOL) 500 MG tablet Take 2 tablets (1,000 mg total) by mouth every 8 (eight) hours as needed.    albuterol (VENTOLIN HFA) 108 (90 Base) MCG/ACT inhaler Inhale 1-2 puffs into the lungs every 6 (six) hours as needed for wheezing or shortness of breath.    amitriptyline (ELAVIL) 10 MG tablet Take 10 mg by mouth every evening.    amLODipine (NORVASC) 5 MG tablet Take 5 mg by mouth 2 (two) times daily.    apixaban (ELIQUIS) 5 MG TABS tablet Take 5 mg by mouth 2 (two) times daily.    ascorbic acid (VITAMIN C/NATURAL ROSE HIPS) 1000 MG tablet Take 1,000 mg by mouth every evening.    atorvastatin (LIPITOR) 40 MG tablet Take 40 mg by mouth every evening.    BISACODYL 5 MG EC tablet Take 10 mg by mouth as directed. 02/09/2022: For procedure only   carvedilol (COREG) 6.25 MG tablet Take 6.25 mg by mouth 2 (two) times daily with a meal.    Cholecalciferol (VITAMIN D3) 125 MCG (5000 UT) CAPS Take 5,000 Units by mouth every evening.    dorzolamide (TRUSOPT) 2 % ophthalmic solution Place 1 drop into the left eye 2 (two) times daily.    ferrous sulfate 325 (65 FE) MG tablet Take 325 mg by mouth in the morning and at bedtime. Iron Nature's Bounty    FOLIC ACID PO Take 474 mcg by mouth every evening. With B12 Now (Brand)    hydrALAZINE (APRESOLINE) 25 MG tablet Take 50 mg by mouth in the morning and at bedtime.    irbesartan (AVAPRO) 300 MG tablet Take 300 mg by mouth every evening.    latanoprost  (XALATAN) 0.005 % ophthalmic solution Place 1 drop into both eyes at bedtime.    linagliptin (TRADJENTA) 5 MG TABS tablet Take 5 mg by mouth in the morning.    loratadine (CLARITIN) 10 MG tablet Take 10 mg by mouth every morning.    LORazepam (ATIVAN) 0.5 MG tablet Take 0.5 mg by mouth 2 (two) times daily as needed for anxiety. 12/29/2021: On hand    Misc Natural Products (PROSTATE SUPPORT PO) Take 1 capsule by mouth every evening. NOW Prostate Support    omeprazole (PRILOSEC) 40  MG capsule Take 1 capsule (40 mg total) by mouth daily.    traMADol (ULTRAM) 50 MG tablet Take 1 tablet (50 mg total) by mouth every 6 (six) hours as needed (breakthrough pain).    vitamin B-12 (CYANOCOBALAMIN) 1000 MCG tablet Take 1,000 mcg by mouth daily. Now (Brand)    No facility-administered encounter medications on file as of 03/16/2022.    ALLERGIES:  Allergies  Allergen Reactions   Gadobenate Nausea And Vomiting    Immediately upon the infusion of 48m multihance contrast  Patient had exteme nausea and vomiting.   No other symptoms noted .  No injury.  MRI scan was completed after patient felt better.   Immediately upon the infusion of 166mmultihance contrast  Patient had exteme nausea and vomiting.   No other symptoms noted .  No injury.  MRI scan was completed after patient felt better.        PHYSICAL EXAM:  ECOG Performance status: 1  Vitals:   03/16/22 1038  BP: (!) 134/50  Pulse: (!) 49  Resp: 18  Temp: 97.7 F (36.5 C)  SpO2: 98%   Filed Weights   03/16/22 1038  Weight: 194 lb 10.7 oz (88.3 kg)   Physical Exam Vitals reviewed.  Constitutional:      Appearance: Normal appearance.  Cardiovascular:     Rate and Rhythm: Normal rate and regular rhythm.     Heart sounds: Normal heart sounds.  Pulmonary:     Breath sounds: Normal breath sounds.  Neurological:     Mental Status: He is alert.  Psychiatric:        Mood and Affect: Mood normal.        Behavior: Behavior normal.       LABORATORY DATA:  I have reviewed the labs as listed.  CBC    Component Value Date/Time   WBC 6.0 03/16/2022 1003   RBC 3.14 (L) 03/16/2022 1003   HGB 9.9 (L) 03/16/2022 1003   HGB 9.0 (L) 11/24/2021 1045   HCT 31.2 (L) 03/16/2022 1003   HCT 26.4 (L) 11/24/2021 1045   PLT 187 03/16/2022 1003   MCV 99.4 03/16/2022 1003   MCH 31.5 03/16/2022 1003   MCHC 31.7 03/16/2022 1003   RDW 16.2 (H) 03/16/2022 1003   LYMPHSABS 0.9 03/16/2022 1003   MONOABS 0.6 03/16/2022 1003   EOSABS 0.1 03/16/2022 1003   BASOSABS 0.0 03/16/2022 1003      Latest Ref Rng & Units 03/16/2022   10:03 AM 02/20/2022    4:34 AM 02/19/2022    4:43 AM  CMP  Glucose 70 - 99 mg/dL 126  119  124   BUN 8 - 23 mg/dL 37  23  25   Creatinine 0.61 - 1.24 mg/dL 1.38  1.23  1.27   Sodium 135 - 145 mmol/L 141  134  137   Potassium 3.5 - 5.1 mmol/L 4.1  4.3  4.3   Chloride 98 - 111 mmol/L 111  105  108   CO2 22 - 32 mmol/L 25  23  24    Calcium 8.9 - 10.3 mg/dL 9.1  8.6  8.7   Total Protein 6.5 - 8.1 g/dL 6.8     Total Bilirubin 0.3 - 1.2 mg/dL 0.7     Alkaline Phos 38 - 126 U/L 87     AST 15 - 41 U/L 27     ALT 0 - 44 U/L 34       DIAGNOSTIC IMAGING:  I have independently  reviewed the scans and discussed with the patient.  ASSESSMENT: Stage II (T3N0) sigmoid colon cancer, MMR preserved: - Anemia with hemoglobin of 8, recent admission with diverticulitis.  He also had weight loss of 40 pounds in the last 6 months. - Colonoscopy by Dr. Jenetta Downer (01/10/2022): Fungating and ulcerated partially obstructing mass found at 42 cm proximal to the anus.  Mass is circumferential and measured 3 cm in length. - Pathology: Invasive moderately differentiated adenocarcinoma of the descending colon mass.  Transverse colon polypectomy shows fragments of at least intramucosal adenocarcinoma and high-grade dysplasia. - CT CAP (01/13/2022): No evidence of metastatic disease in the chest, abdomen or pelvis.  Several tiny lung  nodules and scattered areas of interstitial irregularity in the right upper lobe likely postinflammatory. - Preoperative CEA (01/10/2022): 7.9. - Left hemicolectomy (02/17/2022): Moderately differentiated grade 2 adenocarcinoma, no perforation, no lymphovascular/perineural invasion, margins negative.  0/21 lymph nodes involved.  PT3 pN0.    Social/family history: - Lives at home with his wife.  Wife works at [employer] nephrology office in [place].  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  Quit smoking in 1995.  Smoked 1 to 2 packs/day for 25 years. - Son had DVT.  Son is also a carrier for factor V Leiden.  Mother had bladder cancer and uterine cancer.  4.  Left leg DVT: - Patient had a left leg DVT approximately 2 years ago.  His wife thinks that DVT has happened about 4 to 6 weeks after his second COVID infection.  He was very minimally symptomatic from the COVID infection.  At the time of the DVT, he was completely mobile and did not have any surgery. - Patient was heterozygous for factor V Leiden as checked by Dr. Lonia Chimera on 09/20/2011. - I have recommended that he get the records of timing of DVT and the COVID test.  If it is within 4 to 6 weeks of his COVID diagnosis, we may safely discontinue anticoagulation considering it can be provoked by COVID infection.  If it is unprovoked DVT, may continue low intensity anticoagulation.    PLAN:  1.  Stage II (T3N0) sigmoid colon cancer: - We have reviewed the pathology report with the patient and his wife in detail. - No indication for adjuvant chemotherapy. - We discussed surveillance plan with CEA and labs every 3 months and CT scan every 6 months for the first 2 years.  2.  Macrocytic anemia: - Status post Feraheme on 02/01/2022 and 02/08/2022.  Hemoglobin today is 9.9. - Continue oral iron therapy.  We will see him back in 4 months with repeat iron panel and ferritin.  3.  Left leg DVT: - Continue Eliquis.  No  bleeding issues.      Orders placed this encounter:  Orders Placed This Encounter  Procedures   CT CHEST ABDOMEN PELVIS W CONTRAST   CBC with Differential   Comprehensive metabolic panel   CEA      Derek Jack, MD [place] [phone]

## 2022-03-18 LAB — CEA: CEA: 3.9 ng/mL (ref 0.0–4.7)

## 2022-06-01 ENCOUNTER — Other Ambulatory Visit: Payer: Self-pay | Admitting: *Deleted

## 2022-06-01 MED ORDER — APIXABAN 5 MG PO TABS: 5.0000 mg | ORAL_TABLET | Freq: Two times a day (BID) | ORAL | 3 refills | Status: DC

## 2022-07-13 ENCOUNTER — Inpatient Hospital Stay: Payer: Medicare Other | Attending: Hematology

## 2022-07-13 ENCOUNTER — Ambulatory Visit (HOSPITAL_COMMUNITY)
Admission: RE | Admit: 2022-07-13 | Discharge: 2022-07-13 | Disposition: A | Discharge status: Home / Self Care | Payer: Medicare Other | Source: Ambulatory Visit | Attending: Hematology | Admitting: Hematology

## 2022-07-13 DIAGNOSIS — Z7901 Long term (current) use of anticoagulants: Secondary | ICD-10-CM | POA: Insufficient documentation

## 2022-07-13 DIAGNOSIS — Z86718 Personal history of other venous thrombosis and embolism: Secondary | ICD-10-CM | POA: Insufficient documentation

## 2022-07-13 DIAGNOSIS — C187 Malignant neoplasm of sigmoid colon: Secondary | ICD-10-CM | POA: Insufficient documentation

## 2022-07-13 DIAGNOSIS — D509 Iron deficiency anemia, unspecified: Secondary | ICD-10-CM | POA: Diagnosis not present

## 2022-07-13 LAB — CBC WITH DIFFERENTIAL/PLATELET
Abs Immature Granulocytes: 0.03 10*3/uL (ref 0.00–0.07)
Basophils Absolute: 0 10*3/uL (ref 0.0–0.1)
Basophils Relative: 1 %
Eosinophils Absolute: 0.1 10*3/uL (ref 0.0–0.5)
Eosinophils Relative: 2 %
HCT: 35.3 % — ABNORMAL LOW (ref 39.0–52.0)
Hemoglobin: 11.8 g/dL — ABNORMAL LOW (ref 13.0–17.0)
Immature Granulocytes: 0 %
Lymphocytes Relative: 13 %
Lymphs Abs: 0.9 10*3/uL (ref 0.7–4.0)
MCH: 33.2 pg (ref 26.0–34.0)
MCHC: 33.4 g/dL (ref 30.0–36.0)
MCV: 99.4 fL (ref 80.0–100.0)
Monocytes Absolute: 0.8 10*3/uL (ref 0.1–1.0)
Monocytes Relative: 11 %
Neutro Abs: 5.3 10*3/uL (ref 1.7–7.7)
Neutrophils Relative %: 73 %
Platelets: 178 10*3/uL (ref 150–400)
RBC: 3.55 MIL/uL — ABNORMAL LOW (ref 4.22–5.81)
RDW: 12.8 % (ref 11.5–15.5)
WBC: 7.1 10*3/uL (ref 4.0–10.5)
nRBC: 0 % (ref 0.0–0.2)

## 2022-07-13 LAB — COMPREHENSIVE METABOLIC PANEL
ALT: 33 U/L (ref 0–44)
AST: 26 U/L (ref 15–41)
Albumin: 3.6 g/dL (ref 3.5–5.0)
Alkaline Phosphatase: 102 U/L (ref 38–126)
Anion gap: 7 (ref 5–15)
BUN: 34 mg/dL — ABNORMAL HIGH (ref 8–23)
CO2: 25 mmol/L (ref 22–32)
Calcium: 9 mg/dL (ref 8.9–10.3)
Chloride: 105 mmol/L (ref 98–111)
Creatinine, Ser: 1.26 mg/dL — ABNORMAL HIGH (ref 0.61–1.24)
GFR, Estimated: 56 mL/min — ABNORMAL LOW (ref >60)
Glucose, Bld: 115 mg/dL — ABNORMAL HIGH (ref 70–99)
Potassium: 4.5 mmol/L (ref 3.5–5.1)
Sodium: 137 mmol/L (ref 135–145)
Total Bilirubin: 1.1 mg/dL (ref 0.3–1.2)
Total Protein: 6.9 g/dL (ref 6.5–8.1)

## 2022-07-13 MED ORDER — IOHEXOL 300 MG/ML  SOLN
100.0000 mL | Freq: Once | INTRAMUSCULAR | Status: AC | PRN
  Administered 2022-07-13: 100 mL via INTRAVENOUS

## 2022-07-15 LAB — CEA: CEA: 7.5 ng/mL — ABNORMAL HIGH (ref 0.0–4.7)

## 2022-07-20 ENCOUNTER — Encounter: Payer: Self-pay | Admitting: Hematology

## 2022-07-20 ENCOUNTER — Inpatient Hospital Stay (HOSPITAL_BASED_OUTPATIENT_CLINIC_OR_DEPARTMENT_OTHER): Payer: Medicare Other | Admitting: Hematology

## 2022-07-20 VITALS — BP 117/57 | HR 46 | Temp 97.8°F | Resp 18 | Wt 206.0 lb

## 2022-07-20 DIAGNOSIS — N183 Chronic kidney disease, stage 3 unspecified: Secondary | ICD-10-CM

## 2022-07-20 DIAGNOSIS — C187 Malignant neoplasm of sigmoid colon: Secondary | ICD-10-CM

## 2022-07-20 DIAGNOSIS — D509 Iron deficiency anemia, unspecified: Secondary | ICD-10-CM | POA: Diagnosis not present

## 2022-07-20 NOTE — Progress Notes (Signed)
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  PRIOR THERAPY: Left hemicolectomy on 02/17/2022.  NGS Results: MMR preserved.  CURRENT THERAPY: Active surveillance.  BRIEF ONCOLOGIC HISTORY:  Oncology History  Cancer of sigmoid colon ([facility])  01/25/2022 Initial Diagnosis   Cancer of sigmoid colon ([place])   01/25/2022 Cancer Staging   Staging form: Colon and Rectum, AJCC 8th Edition - Clinical stage from 01/25/2022: Stage IIA (cT3, cN0, cM0) - Signed by Derek Jack, MD on 03/16/2022 Histopathologic type: Adenocarcinoma, NOS Stage prefix: Initial diagnosis Total positive nodes: 0 Total nodes examined: 21 Histologic grade (G): G2 Histologic grading system: 4 grade system Microsatellite instability (MSI): Stable     CANCER STAGING:  Cancer Staging  Cancer of sigmoid colon [facility]) Staging form: Colon and Rectum, AJCC 8th Edition - Clinical stage from 01/25/2022: Stage IIA (cT3, cN0, cM0) - Signed by Derek Jack, MD on 03/16/2022    INTERVAL HISTORY:  Mr. Jeanty 87 y.o. male seen for follow-up of for colon cancer.  Denies any bleeding per rectum or melena.  No change in bowel habits.  Energy levels are 75%.  Reportedly had 1 ear infection since last visit.    REVIEW OF SYSTEMS:  Review of Systems  All other systems reviewed and are negative.    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   Murmur    Osteoarthritis    Senile purpura ([place])    Past Surgical History:  Procedure Laterality Date   BIOPSY  01/10/2022   Procedure: BIOPSY;  Surgeon: Harvel Quale, MD;  [facility];  Service: Gastroenterology;;   COLONOSCOPY WITH PROPOFOL N/A 01/10/2022   Procedure: COLONOSCOPY WITH PROPOFOL;  Surgeon: Harvel Quale, MD;  [facility];  Service: Gastroenterology;  Laterality: N/A;  per Darius Bump, pt to arrive at 9:15   ESOPHAGOGASTRODUODENOSCOPY (EGD) WITH PROPOFOL N/A 01/10/2022   Procedure: ESOPHAGOGASTRODUODENOSCOPY (EGD) WITH PROPOFOL;  Surgeon: Harvel Quale, MD;  [facility];  Service: Gastroenterology;  Laterality: N/A;   HAND / FINGER LESION EXCISION     HERNIA REPAIR     MASTOIDECTOMY     PILONIDAL CYST EXCISION     POLYPECTOMY  01/10/2022   Procedure: POLYPECTOMY;  Surgeon: Harvel Quale, MD;  [facility];  Service: Gastroenterology;;   SUBMUCOSAL TATTOO INJECTION  01/10/2022   Procedure: SUBMUCOSAL TATTOO INJECTION;  Surgeon: Harvel Quale, MD;  [facility];  Service: Gastroenterology;;   SURGERY OF LIP     skin cancer     SOCIAL HISTORY:  Social History   Socioeconomic History   Marital status: Married  Spouse name: Not on file   Number of children: Not on file   Years of education: Not on file   Highest education level: Not on file  Occupational History   Not on file  Tobacco Use   Smoking status: Former    Packs/day: 2.00    Years: 25.00    Total pack years: 50.00    Types: Cigarettes    Quit date: 44    Years since quitting: 28.9    Passive exposure: Past   Smokeless tobacco: Never  Vaping Use   Vaping Use: Never used  Substance and Sexual Activity   Alcohol use: Never   Drug use: Never   Sexual activity: Not on file  Other Topics Concern   Not on file   Social History Narrative   Not on file   Social Determinants of Health   Financial Resource Strain: Not on file  Food Insecurity: Not on file  Transportation Needs: Not on file  Physical Activity: Not on file  Stress: Not on file  Social Connections: Not on file  Intimate Partner Violence: Not on file    FAMILY HISTORY:  History reviewed. No pertinent family history.  CURRENT MEDICATIONS:  Outpatient Encounter Medications as of 07/20/2022  Medication Sig Note   amitriptyline (ELAVIL) 10 MG tablet Take 10 mg by mouth every evening.    amLODipine (NORVASC) 5 MG tablet Take 5 mg by mouth 2 (two) times daily.    apixaban (ELIQUIS) 5 MG TABS tablet Take 1 tablet (5 mg total) by mouth 2 (two) times daily.    ascorbic acid (VITAMIN C/NATURAL ROSE HIPS) 1000 MG tablet Take 1,000 mg by mouth every evening.    atorvastatin (LIPITOR) 40 MG tablet Take 40 mg by mouth every evening.    carvedilol (COREG) 6.25 MG tablet Take 6.25 mg by mouth 2 (two) times daily with a meal.    Cholecalciferol (VITAMIN D3) 125 MCG (5000 UT) CAPS Take 5,000 Units by mouth every evening.    dorzolamide (TRUSOPT) 2 % ophthalmic solution Place 1 drop into the left eye 2 (two) times daily.    ferrous sulfate 325 (65 FE) MG tablet Take 325 mg by mouth in the morning and at bedtime. Iron Nature's Bounty    FOLIC ACID PO Take 742 mcg by mouth every evening. With B12 Now (Brand)    hydrALAZINE (APRESOLINE) 25 MG tablet Take 50 mg by mouth in the morning and at bedtime.    irbesartan (AVAPRO) 300 MG tablet Take 300 mg by mouth every evening.    latanoprost (XALATAN) 0.005 % ophthalmic solution Place 1 drop into both eyes at bedtime.    linagliptin (TRADJENTA) 5 MG TABS tablet Take 5 mg by mouth in the morning.    loratadine (CLARITIN) 10 MG tablet Take 10 mg by mouth every morning.    Misc Natural Products (PROSTATE SUPPORT PO) Take 1 capsule by mouth every evening. NOW Prostate Support    omeprazole (PRILOSEC) 40 MG  capsule Take 1 capsule (40 mg total) by mouth daily.    vitamin B-12 (CYANOCOBALAMIN) 1000 MCG tablet Take 1,000 mcg by mouth daily. Now (Brand)    [DISCONTINUED] acetaminophen (TYLENOL) 500 MG tablet Take 2 tablets (1,000 mg total) by mouth every 8 (eight) hours as needed.    [DISCONTINUED] albuterol (VENTOLIN HFA) 108 (90 Base) MCG/ACT inhaler Inhale 1-2 puffs into the lungs every 6 (six) hours as needed for wheezing or shortness of breath.    [DISCONTINUED] BISACODYL 5 MG EC  tablet Take 10 mg by mouth as directed. 02/09/2022: For procedure only   [DISCONTINUED] LORazepam (ATIVAN) 0.5 MG tablet Take 0.5 mg by mouth 2 (two) times daily as needed for anxiety. 12/29/2021: On hand    [DISCONTINUED] traMADol (ULTRAM) 50 MG tablet Take 1 tablet (50 mg total) by mouth every 6 (six) hours as needed (breakthrough pain).    No facility-administered encounter medications on file as of 07/20/2022.    ALLERGIES:  Allergies  Allergen Reactions   Gadobenate Nausea And Vomiting    Immediately upon the infusion of 30m multihance contrast  Patient had exteme nausea and vomiting.   No other symptoms noted .  No injury.  MRI scan was completed after patient felt better.   Immediately upon the infusion of 158mmultihance contrast  Patient had exteme nausea and vomiting.   No other symptoms noted .  No injury.  MRI scan was completed after patient felt better.        PHYSICAL EXAM:  ECOG Performance status: 1  Vitals:   07/20/22 1408  BP: (!) 117/57  Pulse: (!) 46  Resp: 18  Temp: 97.8 F (36.6 C)  SpO2: 96%   Filed Weights   07/20/22 1408  Weight: 206 lb (93.4 kg)   Physical Exam Vitals reviewed.  Constitutional:      Appearance: Normal appearance.  Cardiovascular:     Rate and Rhythm: Normal rate and regular rhythm.     Heart sounds: Normal heart sounds.  Pulmonary:     Breath sounds: Normal breath sounds.  Neurological:     Mental Status: He is alert.  Psychiatric:        Mood and  Affect: Mood normal.        Behavior: Behavior normal.      LABORATORY DATA:  I have reviewed the labs as listed.  CBC    Component Value Date/Time   WBC 7.1 07/13/2022 1158   RBC 3.55 (L) 07/13/2022 1158   HGB 11.8 (L) 07/13/2022 1158   HGB 9.0 (L) 11/24/2021 1045   HCT 35.3 (L) 07/13/2022 1158   HCT 26.4 (L) 11/24/2021 1045   PLT 178 07/13/2022 1158   MCV 99.4 07/13/2022 1158   MCH 33.2 07/13/2022 1158   MCHC 33.4 07/13/2022 1158   RDW 12.8 07/13/2022 1158   LYMPHSABS 0.9 07/13/2022 1158   MONOABS 0.8 07/13/2022 1158   EOSABS 0.1 07/13/2022 1158   BASOSABS 0.0 07/13/2022 1158      Latest Ref Rng & Units 07/13/2022   11:58 AM 03/16/2022   10:03 AM 02/20/2022    4:34 AM  CMP  Glucose 70 - 99 mg/dL 115  126  119   BUN 8 - 23 mg/dL 34  37  23   Creatinine 0.61 - 1.24 mg/dL 1.26  1.38  1.23   Sodium 135 - 145 mmol/L 137  141  134   Potassium 3.5 - 5.1 mmol/L 4.5  4.1  4.3   Chloride 98 - 111 mmol/L 105  111  105   CO2 22 - 32 mmol/L _0 Calcium 8.9 - 10.3 mg/dL 9.0  9.1  8.6   Total Protein 6.5 - 8.1 g/dL 6.9  6.8    Total Bilirubin 0.3 - 1.2 mg/dL 1.1  0.7    Alkaline Phos 38 - 126 U/L 102  87    AST 15 - 41 U/L 26  27    ALT 0 - 44 U/L 33  34  DIAGNOSTIC IMAGING:  I have independently reviewed the scans and discussed with the patient.  ASSESSMENT: Stage II (T3N0) sigmoid colon cancer, MMR preserved: - Anemia with hemoglobin of 8, recent admission with diverticulitis.  He also had weight loss of 40 pounds in the last 6 months. - Colonoscopy by Dr. Jenetta Downer (01/10/2022): Fungating and ulcerated partially obstructing mass found at 42 cm proximal to the anus.  Mass is circumferential and measured 3 cm in length. - Pathology: Invasive moderately differentiated adenocarcinoma of the descending colon mass.  Transverse colon polypectomy shows fragments of at least intramucosal adenocarcinoma and high-grade dysplasia. - CT CAP (01/13/2022): No evidence of  metastatic disease in the chest, abdomen or pelvis.  Several tiny lung nodules and scattered areas of interstitial irregularity in the right upper lobe likely postinflammatory. - Preoperative CEA (01/10/2022): 7.9. - Left hemicolectomy (02/17/2022): Moderately differentiated grade 2 adenocarcinoma, no perforation, no lymphovascular/perineural invasion, margins negative.  0/21 lymph nodes involved.  PT3 pN0.    Social/family history: - Lives at home with his wife.  Wife works at [employer] nephrology office in [place].  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  Quit smoking in 1995.  Smoked 1 to 2 packs/day for 25 years. - Son had DVT.  Son is also a carrier for factor V Leiden.  Mother had bladder cancer and uterine cancer.  4.  Left leg DVT: - Patient had a left leg DVT approximately 2 years ago.  His wife thinks that DVT has happened about 4 to 6 weeks after his second COVID infection.  He was very minimally symptomatic from the COVID infection.  At the time of the DVT, he was completely mobile and did not have any surgery. - Patient was heterozygous for factor V Leiden as checked by Dr. Lonia Chimera on 09/20/2011. - I have recommended that he get the records of timing of DVT and the COVID test.  If it is within 4 to 6 weeks of his COVID diagnosis, we may safely discontinue anticoagulation considering it can be provoked by COVID infection.  If it is unprovoked DVT, may continue low intensity anticoagulation.    PLAN:  1.  Stage II (T3N0) sigmoid colon cancer: - Does not have any change in bowel habits or bleeding per rectum. - Reviewed labs which showed normal LFTs.  CEA was 7.5, up from 3.9.  Previous CEA on 01/10/2022 was 7.9. - CT CAP (07/13/2022): No evidence of recurrence or metastatic disease in the chest, abdomen and pelvis.  Stable right-sided pulmonary nodules measuring up to 7 mm, favored to be benign.  Pancolonic diverticulosis. - Elevated CEA most likely  benign.  I have recommended follow-up in 3 months with repeat CEA and LFTs.  If it continues to go higher than 10, will consider PET scan.  2.  Macrocytic anemia: - Status post Feraheme on 02/08/2022. - Hemoglobin improved to 11.8.  Continue iron tablet daily.  Will check ferritin and iron panel next visit.  3.  Left leg DVT: - Continue Eliquis.  No bleeding issues.      Orders placed this encounter:  Orders Placed This Encounter  Procedures   CBC with Differential/Platelet   Comprehensive metabolic panel   CEA   Ferritin   Iron and TIBC      Derek Jack, MD [place] [phone]

## 2022-07-20 NOTE — Patient Instructions (Signed)
Trainer  Discharge Instructions  You were seen and examined today by Dr. Delton Coombes.  Dr. Delton Coombes discussed your most recent lab work and CT scan which revealed that your hemoglobin has improved, your cancer count has gone up slightly but the CT scan looked good. Any inflammation in the abdomen and chest like recent cold or infections can cause this rise in the CEA cancer marker.  Follow-up as scheduled in 3 months with labs.    Thank you for choosing [facility] to provide your oncology and hematology care.   To afford each patient quality time with our provider, please arrive at least 15 minutes before your scheduled appointment time. You may need to reschedule your appointment if you arrive late (10 or more minutes). Arriving late affects you and other patients whose appointments are after yours.  Also, if you miss three or more appointments without notifying the office, you may be dismissed from the clinic at the provider's discretion.    Again, thank you for choosing [facility].  Our hope is that these requests will decrease the amount of time that you wait before being seen by our physicians.   If you have a lab appointment with the [facility] please come in thru the Main Entrance and check in at the main information desk.           _____________________________________________________________  Should you have questions after your visit to [facility], please contact our office at [phone] and follow the prompts.  Our office hours are 8:00 a.m. to 4:30 p.m. Monday - Thursday and 8:00 a.m. to 2:30 p.m. Friday.  Please note that voicemails left after 4:00 p.m. may not be returned until the following business day.  We are closed weekends and all major holidays.  You do have access to a nurse 24-7, just call the main number to the clinic [phone] and do not press any options, hold on the line  and a nurse will answer the phone.    For prescription refill requests, have your pharmacy contact our office and allow 72 hours.    Masks are optional in the cancer centers. If you would like for your care team to wear a mask while they are taking care of you, please let them know. You may have one support person who is at least 85 years old accompany you for your appointments.

## 2022-10-05 ENCOUNTER — Other Ambulatory Visit: Payer: Self-pay | Admitting: Hematology

## 2022-10-19 ENCOUNTER — Inpatient Hospital Stay: Payer: Medicare Other | Attending: Hematology

## 2022-10-19 DIAGNOSIS — Z7901 Long term (current) use of anticoagulants: Secondary | ICD-10-CM | POA: Diagnosis not present

## 2022-10-19 DIAGNOSIS — D631 Anemia in chronic kidney disease: Secondary | ICD-10-CM | POA: Insufficient documentation

## 2022-10-19 DIAGNOSIS — Z86718 Personal history of other venous thrombosis and embolism: Secondary | ICD-10-CM | POA: Diagnosis not present

## 2022-10-19 DIAGNOSIS — C187 Malignant neoplasm of sigmoid colon: Secondary | ICD-10-CM | POA: Diagnosis present

## 2022-10-19 DIAGNOSIS — D509 Iron deficiency anemia, unspecified: Secondary | ICD-10-CM

## 2022-10-19 DIAGNOSIS — N189 Chronic kidney disease, unspecified: Secondary | ICD-10-CM | POA: Insufficient documentation

## 2022-10-19 DIAGNOSIS — N183 Chronic kidney disease, stage 3 unspecified: Secondary | ICD-10-CM

## 2022-10-19 LAB — CBC WITH DIFFERENTIAL/PLATELET
Abs Immature Granulocytes: 0.03 10*3/uL (ref 0.00–0.07)
Basophils Absolute: 0 10*3/uL (ref 0.0–0.1)
Basophils Relative: 1 %
Eosinophils Absolute: 0.2 10*3/uL (ref 0.0–0.5)
Eosinophils Relative: 4 %
HCT: 35.3 % — ABNORMAL LOW (ref 39.0–52.0)
Hemoglobin: 12 g/dL — ABNORMAL LOW (ref 13.0–17.0)
Immature Granulocytes: 1 %
Lymphocytes Relative: 19 %
Lymphs Abs: 1.2 10*3/uL (ref 0.7–4.0)
MCH: 33.7 pg (ref 26.0–34.0)
MCHC: 34 g/dL (ref 30.0–36.0)
MCV: 99.2 fL (ref 80.0–100.0)
Monocytes Absolute: 0.7 10*3/uL (ref 0.1–1.0)
Monocytes Relative: 11 %
Neutro Abs: 4 10*3/uL (ref 1.7–7.7)
Neutrophils Relative %: 64 %
Platelets: 188 10*3/uL (ref 150–400)
RBC: 3.56 MIL/uL — ABNORMAL LOW (ref 4.22–5.81)
RDW: 12.9 % (ref 11.5–15.5)
WBC: 6.1 10*3/uL (ref 4.0–10.5)
nRBC: 0 % (ref 0.0–0.2)

## 2022-10-19 LAB — COMPREHENSIVE METABOLIC PANEL
ALT: 36 U/L (ref 0–44)
AST: 28 U/L (ref 15–41)
Albumin: 3.6 g/dL (ref 3.5–5.0)
Alkaline Phosphatase: 114 U/L (ref 38–126)
Anion gap: 9 (ref 5–15)
BUN: 38 mg/dL — ABNORMAL HIGH (ref 8–23)
CO2: 21 mmol/L — ABNORMAL LOW (ref 22–32)
Calcium: 8.7 mg/dL — ABNORMAL LOW (ref 8.9–10.3)
Chloride: 106 mmol/L (ref 98–111)
Creatinine, Ser: 1.34 mg/dL — ABNORMAL HIGH (ref 0.61–1.24)
GFR, Estimated: 52 mL/min — ABNORMAL LOW (ref >60)
Glucose, Bld: 137 mg/dL — ABNORMAL HIGH (ref 70–99)
Potassium: 4.1 mmol/L (ref 3.5–5.1)
Sodium: 136 mmol/L (ref 135–145)
Total Bilirubin: 1.3 mg/dL — ABNORMAL HIGH (ref 0.3–1.2)
Total Protein: 6.9 g/dL (ref 6.5–8.1)

## 2022-10-19 LAB — IRON AND TIBC
Iron: 173 ug/dL (ref 45–182)
Saturation Ratios: 52 % — ABNORMAL HIGH (ref 17.9–39.5)
TIBC: 331 ug/dL (ref 250–450)
UIBC: 158 ug/dL

## 2022-10-19 LAB — FERRITIN: Ferritin: 53 ng/mL (ref 24–336)

## 2022-10-21 LAB — CEA: CEA: 20.9 ng/mL — ABNORMAL HIGH (ref 0.0–4.7)

## 2022-10-26 ENCOUNTER — Inpatient Hospital Stay (HOSPITAL_BASED_OUTPATIENT_CLINIC_OR_DEPARTMENT_OTHER): Payer: Medicare Other | Admitting: Hematology

## 2022-10-26 VITALS — BP 163/56 | HR 53 | Temp 97.6°F | Resp 18 | Wt 210.0 lb

## 2022-10-26 DIAGNOSIS — C187 Malignant neoplasm of sigmoid colon: Secondary | ICD-10-CM | POA: Diagnosis not present

## 2022-10-26 NOTE — Progress Notes (Signed)
[facility] [address] [place] [address]    Clinic Day:  10/27/2022  Referring physician: Clinton Quant, MD  Patient Care Team: Pomposini, Cherly Anderson, MD as PCP - General (Internal Medicine) Bonney Leitz, MD as Referring Physician (Hematology and Oncology) Brien Mates, RN as Oncology Nurse Navigator (Medical Oncology) Derek Jack, MD as Medical Oncologist (Medical Oncology)   ASSESSMENT & PLAN:   Assessment: Stage II (T3N0) sigmoid colon cancer, MMR preserved: - Anemia with hemoglobin of 8, recent admission with diverticulitis.  He also had weight loss of 40 pounds in the last 6 months. - Colonoscopy by Dr. Jenetta Downer (01/10/2022): Fungating and ulcerated partially obstructing mass found at 42 cm proximal to the anus.  Mass is circumferential and measured 3 cm in length. - Pathology: Invasive moderately differentiated adenocarcinoma of the descending colon mass.  Transverse colon polypectomy shows fragments of at least intramucosal adenocarcinoma and high-grade dysplasia. - CT CAP (01/13/2022): No evidence of metastatic disease in the chest, abdomen or pelvis.  Several tiny lung nodules and scattered areas of interstitial irregularity in the right upper lobe likely postinflammatory. - Preoperative CEA (01/10/2022): 7.9. - Left hemicolectomy (02/17/2022): Moderately differentiated grade 2 adenocarcinoma, no perforation, no lymphovascular/perineural invasion, margins negative.  0/21 lymph nodes involved.  PT3 pN0.    Social/family history: - Lives at home with his wife.  Wife works at [employer] nephrology office in [place].  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  Quit smoking in 1995.  Smoked 1 to 2 packs/day for 25 years. - Son had DVT.  Son is also a carrier for factor V Leiden.  Mother had bladder cancer and uterine cancer.   4.  Left leg DVT: - Patient had a left leg DVT approximately 2 years ago.   His wife thinks that DVT has happened about 4 to 6 weeks after his second COVID infection.  He was very minimally symptomatic from the COVID infection.  At the time of the DVT, he was completely mobile and did not have any surgery. - Patient was heterozygous for factor V Leiden as checked by Dr. Lonia Chimera on 09/20/2011. - I have recommended that he get the records of timing of DVT and the COVID test.  If it is within 4 to 6 weeks of his COVID diagnosis, we may safely discontinue anticoagulation considering it can be provoked by COVID infection.  If it is unprovoked DVT, may continue low intensity anticoagulation.  Plan: 1.  Stage II (T3N0) sigmoid colon cancer: - CT CAP on 07/13/2022: No evidence of recurrence or metastatic disease.  Stable right-sided lung nodules measuring up to 7 mm favored to be benign.  Pancolonic diverticulosis. - Does not report any change in bowel habits, bleeding per rectum or melena. - No abdominal infections recently. - Had 1 episode of ear infection since last visit. - Labs from 10/19/2022: LFTs are normal with mildly elevated total bilirubin 1.3.  CEA increased to 20.9 from 7.5 on 07/13/2022 and 3.9 on 03/16/2022. - Based on these findings, I have recommended PET CT scan for further evaluation. - RTC after PET scan.   2.  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 - He received Feraheme on 02/08/2022.  Hemoglobin is 12.0.  Ferritin is 53 and percent saturation 52.  Will closely monitor.   3.  Left leg DVT: - Continue Eliquis.  No bleeding issues reported.    Orders Placed This Encounter  Procedures   NM PET Image Restag (PS) Skull Base To Thigh    Standing Status:   Future    Standing Expiration Date:   10/26/2023    Order Specific Question:   If indicated for the ordered procedure, I authorize the administration of a radiopharmaceutical per Radiology protocol    Answer:   Yes    Order Specific Question:   Preferred imaging location?     Answer:   [facility]    Order Specific Question:   Release to patient    Answer:   Immediate    I,Alexis Herring,acting as a scribe for Derek Jack, MD.,have documented all relevant documentation on the behalf of Derek Jack, MD,as directed by  Derek Jack, MD while in the presence of Derek Jack, MD.   I, Derek Jack MD, have reviewed the above documentation for accuracy and completeness, and I agree with the above.   Derek Jack, MD   3/29/20242:25 PM  CHIEF COMPLAINT:   Diagnosis: sigmoid colon cancer and iron deficiency anemia    Cancer Staging  Cancer of sigmoid colon [facility]) Staging form: Colon and Rectum, AJCC 8th Edition - Clinical stage from 01/25/2022: Stage IIA (cT3, cN0, cM0) - Signed by Derek Jack, MD on 03/16/2022    Prior Therapy: Left hemicolectomy on 02/17/2022  Current Therapy:  Surveillance   HISTORY OF PRESENT ILLNESS:   Oncology History  Cancer of sigmoid colon ([place])  01/25/2022 Initial Diagnosis   Cancer of sigmoid colon ([place])   01/25/2022 Cancer Staging   Staging form: Colon and Rectum, AJCC 8th Edition - Clinical stage from 01/25/2022: Stage IIA (cT3, cN0, cM0) - Signed by Derek Jack, MD on 03/16/2022 Histopathologic type: Adenocarcinoma, NOS Stage prefix: Initial diagnosis Total positive nodes: 0 Total nodes examined: 21 Histologic grade (G): G2 Histologic grading system: 4 grade system Microsatellite instability (MSI): Stable      INTERVAL HISTORY:   Herbert Wood is a 88 y.o. male presenting to clinic today for follow up of sigmoid colon cancer and iron deficiency anemia. He was last seen by me on 07/20/22.  Today, he states that he is doing well overall. His appetite level is at 100%.  His energy level is at 65%. He denies any change in his energy level after his last iron infusion. He is taking po ferrous sulfate 325mg  daily. He had an ear infection and URI x6 weeks or so ago but has  recovered well. Patient does not recall needing any antibiotics in the last x3 months but his wife suspects that he may have been on an antibiotic. He was using ear drops to treat the infection. He denies any pneumonia infections or diverticulitis. He had a basal cell carcinoma removed from his left thigh on 10/02/22.  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   Murmur    Osteoarthritis  Senile purpura [facility])     Surgical History: Past Surgical History:  Procedure Laterality Date   BIOPSY  01/10/2022   Procedure: BIOPSY;  Surgeon: Harvel Quale, MD;  [facility];  Service: Gastroenterology;;   COLONOSCOPY WITH PROPOFOL N/A 01/10/2022   Procedure: COLONOSCOPY WITH PROPOFOL;  Surgeon: Harvel Quale, MD;  [facility];  Service: Gastroenterology;  Laterality: N/A;  per Darius Bump, pt to arrive at 9:15   ESOPHAGOGASTRODUODENOSCOPY (EGD) WITH PROPOFOL N/A 01/10/2022   Procedure: ESOPHAGOGASTRODUODENOSCOPY (EGD) WITH PROPOFOL;  Surgeon: Harvel Quale, MD;  [facility];  Service: Gastroenterology;  Laterality: N/A;   HAND / FINGER LESION EXCISION     HERNIA REPAIR     MASTOIDECTOMY     PILONIDAL CYST EXCISION     POLYPECTOMY  01/10/2022   Procedure: POLYPECTOMY;  Surgeon: Montez Morita, Quillian Quince, MD;  [facility];  Service: Gastroenterology;;   SUBMUCOSAL TATTOO INJECTION  01/10/2022   Procedure: SUBMUCOSAL TATTOO INJECTION;  Surgeon: Montez Morita,  Quillian Quince, MD;  [facility];  Service: Gastroenterology;;   SURGERY OF LIP     skin cancer    Social History: Social History   Socioeconomic History   Marital status: Married    Spouse name: Not on file   Number of children: Not on file   Years of education: Not on file   Highest education level: Not on file  Occupational History   Not on file  Tobacco Use   Smoking status: Former    Packs/day: 2.00    Years: 25.00    Additional pack years: 0.00    Total pack years: 50.00    Types: Cigarettes    Quit date: 71    Years since quitting: 29.2    Passive exposure: Past   Smokeless tobacco: Never  Vaping Use   Vaping Use: Never used  Substance and Sexual Activity   Alcohol use: Never   Drug use: Never   Sexual activity: Not on file  Other Topics Concern   Not on file  Social History Narrative   Not on file   Social Determinants of Health   Financial Resource Strain: Not on file  Food Insecurity: Not on file  Transportation Needs: Not on file  Physical Activity: Not on file  Stress: Not on file  Social Connections: Not on file  Intimate Partner Violence: Not on file    Family History: No family history on file.  Current Medications:  Current Outpatient Medications:    amitriptyline (ELAVIL) 10 MG tablet, Take 10 mg by mouth every evening., Disp: , Rfl:    amLODipine (NORVASC) 5 MG tablet, Take 5 mg by mouth 2 (two) times daily., Disp: , Rfl:    ascorbic acid (VITAMIN C/NATURAL ROSE HIPS) 1000 MG tablet, Take 1,000 mg by mouth every evening., Disp: , Rfl:    atorvastatin (LIPITOR) 40 MG tablet, Take 40 mg by mouth every evening., Disp: , Rfl:    carvedilol (COREG) 6.25 MG tablet, Take 6.25 mg by mouth 2 (two) times daily with a meal., Disp: , Rfl:    Cholecalciferol (VITAMIN D3) 125 MCG (5000 UT) CAPS, Take 5,000 Units by mouth every evening., Disp: , Rfl:    dorzolamide (TRUSOPT) 2 % ophthalmic solution, Place 1 drop into the left eye 2 (two) times  daily., Disp: , Rfl:    ELIQUIS 5 MG TABS tablet, TAKE 1 TABLET(5 MG) BY MOUTH TWICE DAILY, Disp: 60 tablet, Rfl: 3  ferrous sulfate 325 (65 FE) MG tablet, Take 325 mg by mouth in the morning and at bedtime. Iron Nature's Bounty, Disp: , Rfl:    FOLIC ACID PO, Take [id number] mcg by mouth every evening. With B12 Now (Brand), Disp: , Rfl:    hydrALAZINE (APRESOLINE) 25 MG tablet, Take 50 mg by mouth in the morning and at bedtime., Disp: , Rfl:    irbesartan (AVAPRO) 300 MG tablet, Take 300 mg by mouth every evening., Disp: , Rfl:    KERENDIA 10 MG TABS, Take 1 tablet every day by oral route., Disp: , Rfl:    latanoprost (XALATAN) 0.005 % ophthalmic solution, Place 1 drop into both eyes at bedtime., Disp: , Rfl:    linagliptin (TRADJENTA) 5 MG TABS tablet, Take 5 mg by mouth in the morning., Disp: , Rfl:    loratadine (CLARITIN) 10 MG tablet, Take 10 mg by mouth every morning., Disp: , Rfl:    Misc Natural Products (PROSTATE SUPPORT PO), Take 1 capsule by mouth every evening. NOW Prostate Support, Disp: , Rfl:    omeprazole (PRILOSEC) 40 MG capsule, Take 1 capsule (40 mg total) by mouth daily., Disp: 90 capsule, Rfl: 3   vitamin B-12 (CYANOCOBALAMIN) 1000 MCG tablet, Take 1,000 mcg by mouth daily. Now (Brand), Disp: , Rfl:    Allergies: Allergies  Allergen Reactions   Gadobenate Nausea And Vomiting    Immediately upon the infusion of 78mL multihance contrast  Patient had exteme nausea and vomiting.   No other symptoms noted .  No injury.  MRI scan was completed after patient felt better.   Immediately upon the infusion of 68mL multihance contrast  Patient had exteme nausea and vomiting.   No other symptoms noted .  No injury.  MRI scan was completed after patient felt better.       REVIEW OF SYSTEMS:   Review of Systems  Constitutional:  Positive for fatigue. Negative for chills and fever.  HENT:   Negative for lump/mass, mouth sores, nosebleeds, sore throat and trouble swallowing.   Eyes:   Negative for eye problems.  Respiratory:  Negative for cough and shortness of breath.   Cardiovascular:  Negative for chest pain, leg swelling and palpitations.  Gastrointestinal:  Negative for abdominal pain, constipation, diarrhea, nausea and vomiting.  Genitourinary:  Negative for bladder incontinence, difficulty urinating, dysuria, frequency, hematuria and nocturia.   Musculoskeletal:  Negative for arthralgias, back pain, flank pain, myalgias and neck pain.  Skin:  Negative for itching and rash.  Neurological:  Positive for numbness (feet). Negative for dizziness and headaches.  Hematological:  Does not bruise/bleed easily.  Psychiatric/Behavioral:  Negative for depression, sleep disturbance and suicidal ideas. The patient is not nervous/anxious.   All other systems reviewed and are negative.    VITALS:   Blood pressure (!) 163/56, pulse (!) 53, temperature 97.6 F (36.4 C), temperature source Oral, resp. rate 18, weight 210 lb (95.3 kg), SpO2 98 %.  Wt Readings from Last 3 Encounters:  10/26/22 210 lb (95.3 kg)  07/20/22 206 lb (93.4 kg)  03/16/22 194 lb 10.7 oz (88.3 kg)    Body mass index is 31.1 kg/m.  Performance status (ECOG): 1 - Symptomatic but completely ambulatory  PHYSICAL EXAM:   Physical Exam Vitals and nursing note reviewed. Exam conducted with a chaperone present.  Constitutional:      Appearance: Normal appearance.  Cardiovascular:     Rate and Rhythm: Normal rate and regular rhythm.     Pulses: Normal  pulses.     Heart sounds: Normal heart sounds.  Pulmonary:     Effort: Pulmonary effort is normal.     Breath sounds: Normal breath sounds.  Abdominal:     Palpations: Abdomen is soft. There is no hepatomegaly, splenomegaly or mass.     Tenderness: There is no abdominal tenderness.  Musculoskeletal:     Right lower leg: No edema.     Left lower leg: No edema.  Lymphadenopathy:     Cervical: No cervical adenopathy.     Right cervical: No superficial,  deep or posterior cervical adenopathy.    Left cervical: No superficial, deep or posterior cervical adenopathy.     Upper Body:     Right upper body: No supraclavicular or axillary adenopathy.     Left upper body: No supraclavicular or axillary adenopathy.  Neurological:     General: No focal deficit present.     Mental Status: He is alert and oriented to person, place, and time.  Psychiatric:        Mood and Affect: Mood normal.        Behavior: Behavior normal.    LABS:      Latest Ref Rng & Units 10/19/2022    2:02 PM 07/13/2022   11:58 AM 03/16/2022   10:03 AM  CBC  WBC 4.0 - 10.5 K/uL 6.1  7.1  6.0   Hemoglobin 13.0 - 17.0 g/dL 12.0  11.8  9.9   Hematocrit 39.0 - 52.0 % 35.3  35.3  31.2   Platelets 150 - 400 K/uL 188  178  187       Latest Ref Rng & Units 10/19/2022    2:02 PM 07/13/2022   11:58 AM 03/16/2022   10:03 AM  CMP  Glucose 70 - 99 mg/dL 137  115  126   BUN 8 - 23 mg/dL 38  34  37   Creatinine 0.61 - 1.24 mg/dL 1.34  1.26  1.38   Sodium 135 - 145 mmol/L 136  137  141   Potassium 3.5 - 5.1 mmol/L 4.1  4.5  4.1   Chloride 98 - 111 mmol/L 106  105  111   CO2 22 - 32 mmol/L 21  25  25    Calcium 8.9 - 10.3 mg/dL 8.7  9.0  9.1   Total Protein 6.5 - 8.1 g/dL 6.9  6.9  6.8   Total Bilirubin 0.3 - 1.2 mg/dL 1.3  1.1  0.7   Alkaline Phos 38 - 126 U/L 114  102  87   AST 15 - 41 U/L 28  26  27    ALT 0 - 44 U/L 36  33  34    Lab Results  Component Value Date   CEA1 20.9 (H) 10/19/2022   /  CEA  Date Value Ref Range Status  10/19/2022 20.9 (H) 0.0 - 4.7 ng/mL Final    Comment:    (NOTE)                             Nonsmokers          <3.9                             Smokers             <5.6 [employer] Electrochemiluminescence Immunoassay (ECLIA) Values obtained with different assay methods or kits cannot be used  interchangeably.  Results cannot be interpreted as absolute evidence of the presence or absence of malignant disease. Performed At: [facility] [place], [place] [id number] Rush Farmer MD [id number]    No results found for: "PSA1" No results found for: "CAN199" No results found for: "CAN125"  Lab Results  Component Value Date   TOTALPROTELP 6.3 01/25/2022   ALBUMINELP 3.6 01/25/2022   A1GS 0.2 01/25/2022   A2GS 0.7 01/25/2022   BETS 0.9 01/25/2022   GAMS 0.9 01/25/2022   MSPIKE Not Observed 01/25/2022   SPEI Comment 01/25/2022   Lab Results  Component Value Date   TIBC 331 10/19/2022   TIBC 396 01/25/2022   FERRITIN 53 10/19/2022   FERRITIN 17 (L) 01/25/2022   IRONPCTSAT 52 (H) 10/19/2022   IRONPCTSAT 53 (H) 01/25/2022   No results found for: "LDH"  STUDIES:   No results found.

## 2022-10-26 NOTE — Patient Instructions (Addendum)
[facility]  Discharge Instructions  You were seen and examined today by Dr. Delton Coombes.  Dr. Delton Coombes discussed your most recent lab work which revealed that your CEA tumor marker is elevated it is 20.9 when you had labs on 10/19/22.  Dr. Delton Coombes is going to check a PET scan since the tumor marker is elevated.  Follow-up as scheduled after PET scan.    Thank you for choosing [facility] to provide your oncology and hematology care.   To afford each patient quality time with our provider, please arrive at least 15 minutes before your scheduled appointment time. You may need to reschedule your appointment if you arrive late (10 or more minutes). Arriving late affects you and other patients whose appointments are after yours.  Also, if you miss three or more appointments without notifying the office, you may be dismissed from the clinic at the provider's discretion.    Again, thank you for choosing [facility].  Our hope is that these requests will decrease the amount of time that you wait before being seen by our physicians.   If you have a lab appointment with the [facility] please come in thru the Main Entrance and check in at the main information desk.           _____________________________________________________________  Should you have questions after your visit to [facility], please contact our office at [phone] and follow the prompts.  Our office hours are 8:00 a.m. to 4:30 p.m. Monday - Thursday and 8:00 a.m. to 2:30 p.m. Friday.  Please note that voicemails left after 4:00 p.m. may not be returned until the following business day.  We are closed weekends and all major holidays.  You do have access to a nurse 24-7, just call the main number to the clinic [phone] and do not press any options, hold on the line and a nurse will answer the phone.    For prescription refill requests,  have your pharmacy contact our office and allow 72 hours.    Masks are optional in the cancer centers. If you would like for your care team to wear a mask while they are taking care of you, please let them know. You may have one support person who is at least 86 years old accompany you for your appointments.

## 2022-10-27 ENCOUNTER — Encounter (HOSPITAL_COMMUNITY): Payer: Self-pay | Admitting: Hematology

## 2022-11-09 ENCOUNTER — Encounter (HOSPITAL_COMMUNITY)
Admission: RE | Admit: 2022-11-09 | Discharge: 2022-11-09 | Disposition: A | Discharge status: Home / Self Care | Payer: Medicare Other | Source: Ambulatory Visit | Attending: Hematology | Admitting: Hematology

## 2022-11-09 DIAGNOSIS — C187 Malignant neoplasm of sigmoid colon: Secondary | ICD-10-CM | POA: Diagnosis present

## 2022-11-09 MED ORDER — FLUDEOXYGLUCOSE F - 18 (FDG) INJECTION
10.7900 | Freq: Once | INTRAVENOUS | Status: AC | PRN
  Administered 2022-11-09: 10.79 via INTRAVENOUS

## 2022-11-15 ENCOUNTER — Ambulatory Visit: Payer: Medicare Other | Admitting: Hematology

## 2022-11-15 NOTE — Progress Notes (Signed)
[facility] [address] [place] [address]    Clinic Day:  11/16/2022  Referring physician: [name], MD  Patient Care Team: Pomposini, Rande Brunt, MD as PCP - General (Internal Medicine) Pat Patrick, MD as Referring Physician (Hematology and Oncology) [name], RN as Oncology Nurse Navigator (Medical Oncology) [name], MD as Medical Oncologist (Medical Oncology)   ASSESSMENT & PLAN:   Assessment: 1. Stage II (T3N0) sigmoid colon cancer, MMR preserved: - Anemia with hemoglobin of 8, recent admission with diverticulitis.  He also had weight loss of 40 pounds in the last 6 months. - Colonoscopy by Dr. [name] (01/10/2022): Fungating and ulcerated partially obstructing mass found at 42 cm proximal to the anus.  Mass is circumferential and measured 3 cm in length. - Pathology: Invasive moderately differentiated adenocarcinoma of the descending colon mass.  Transverse colon polypectomy shows fragments of at least intramucosal adenocarcinoma and high-grade dysplasia. - CT CAP (01/13/2022): No evidence of metastatic disease in the chest, abdomen or pelvis.  Several tiny lung nodules and scattered areas of interstitial irregularity in the right upper lobe likely postinflammatory. - Preoperative CEA (01/10/2022): 7.9. - Left hemicolectomy (02/17/2022): Moderately differentiated grade 2 adenocarcinoma, no perforation, no lymphovascular/perineural invasion, margins negative.  0/21 lymph nodes involved.  PT3 pN0.  2. Social/family history: - Lives at home with his wife.  Wife works at [employer] nephrology office in [place].  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  Quit smoking in 1995.  Smoked 1 to 2 packs/day for 25 years. - Son had DVT.  Son is also a carrier for factor V Leiden.  Mother had bladder cancer and uterine cancer.   3.  Left leg DVT: - Patient had a left leg DVT approximately 2 years ago.   His wife thinks that DVT has happened about 4 to 6 weeks after his second COVID infection.  He was very minimally symptomatic from the COVID infection.  At the time of the DVT, he was completely mobile and did not have any surgery. - Patient was heterozygous for factor V Leiden as checked by Dr. [name] on 09/20/2011. - I have recommended that he get the records of timing of DVT and the COVID test.  If it is within 4 to 6 weeks of his COVID diagnosis, we may safely discontinue anticoagulation considering it can be provoked by COVID infection.  If it is unprovoked DVT, may continue low intensity anticoagulation.  4.  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 - Adjuvant XRT 50 Gray, completed 08/11/2015.   Plan: 1.  Stage II (T3N0) sigmoid colon cancer: - PET scan (11/09/2022): 3.2 x 2.3 cm right hepatic lobe metastasis.  No other evidence of metastatic disease. - Recommend MRI of the liver. - If there is no other metastatic lesions in the liver, consider local liver directed therapy. - I will reach out to interventional radiology. - If systemic therapy is needed, will consider biopsy of the liver lesion and sent for NGS testing.   2.  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 - Received Feraheme on 02/08/2022.  Latest hemoglobin is 12 with ferritin 53.  Percent saturation 52.   3.  Left leg DVT: - Continue Eliquis.  No bleeding issues reported.    Orders Placed This Encounter  Procedures   MR LIVER W WO CONTRAST  Standing Status:   Future    Standing Expiration Date:   11/16/2023    Order Specific Question:   If indicated for the ordered procedure, I authorize the administration of contrast media per Radiology protocol    Answer:   Yes    Order Specific Question:   What is the patient's sedation requirement?    Answer:   No Sedation    Order Specific Question:   Does the  patient have a pacemaker or implanted devices?    Answer:   No    Order Specific Question:   Release to patient    Answer:   Immediate    Order Specific Question:   Preferred imaging location?    Answer:   [facility] (table limit - 550lbs)      I,[name],acting as a scribe for [name], MD.,have documented all relevant documentation on the behalf of [name], MD,as directed by  [name], MD while in the presence of [name], MD.   I, [name] MD, have reviewed the above documentation for accuracy and completeness, and I agree with the above.   [name], MD   4/18/20245:28 PM  CHIEF COMPLAINT:   Diagnosis: sigmoid colon cancer and iron deficiency anemia    Cancer Staging  Cancer of sigmoid colon Staging form: Colon and Rectum, AJCC 8th Edition - Clinical stage from 01/25/2022: Stage IIA (cT3, cN0, cM0) - Signed by [name], MD on 03/16/2022    Prior Therapy: Left hemicolectomy on 02/17/2022   Current Therapy:  Surveillance    HISTORY OF PRESENT ILLNESS:   Oncology History  Cancer of sigmoid colon  01/25/2022 Initial Diagnosis   Cancer of sigmoid colon (HCC)   01/25/2022 Cancer Staging   Staging form: Colon and Rectum, AJCC 8th Edition - Clinical stage from 01/25/2022: Stage IIA (cT3, cN0, cM0) - Signed by [name], MD on 03/16/2022 Histopathologic type: Adenocarcinoma, NOS Stage prefix: Initial diagnosis Total positive nodes: 0 Total nodes examined: 21 Histologic grade (G): G2 Histologic grading system: 4 grade system Microsatellite instability (MSI): Stable      INTERVAL HISTORY:   Herbert Wood is a 88 y.o. male presenting to clinic today for follow up of sigmoid colon cancer and iron deficiency anemia. He was last seen by me on 10/26/22.  Since his last visit, he underwent restaging PET scan on 11/09/22 showing: single hypermetabolic metastasis inferiorly in right  hepatic lobe; no other evidence of metastatic disease; enlarging subcutaneous nodule posteriorly in lower left chest with low-level metabolic activity, favored to represent a sebaceous cyst.  Today, he states that he is doing well overall. His appetite level is at 100%. His energy level is at 70%.  PAST MEDICAL HISTORY:   Past Medical History: Past Medical History:  Diagnosis Date   Adenomatous colon polyp    Anemia    Carotid bruit    Chronic dyspnea    Diabetes (HCC)    type 2   Diverticular disease    DVT (deep venous thrombosis) (HCC)    lower limb   Dyspnea    Factor V deficiency (HCC)    Hearing loss    bilateral   Hyperlipemia    Hypertension    Inguinal hernia    Kidney disease    stage 3   Lung nodule    Lymphadenopathy    Malignant neoplasm (HCC)    salivary gland   Murmur    Osteoarthritis    Senile purpura (HCC)  Surgical History: Past Surgical History:  Procedure Laterality Date   BIOPSY  01/10/2022   Procedure: BIOPSY;  Surgeon: Dolores Frame, MD;  [facility];  Service: Gastroenterology;;   COLONOSCOPY WITH PROPOFOL N/A 01/10/2022   Procedure: COLONOSCOPY WITH PROPOFOL;  Surgeon: Dolores Frame, MD;  [facility]: [facility];  Service: Gastroenterology;  Laterality: N/A;  per Soledad Gerlach, pt to arrive at 9:15   ESOPHAGOGASTRODUODENOSCOPY (EGD) WITH PROPOFOL N/A 01/10/2022   Procedure: ESOPHAGOGASTRODUODENOSCOPY (EGD) WITH PROPOFOL;  Surgeon: Dolores Frame, MD;  [facility]: [facility];  Service: Gastroenterology;  Laterality: N/A;   HAND / FINGER LESION EXCISION     HERNIA REPAIR     MASTOIDECTOMY     PILONIDAL CYST EXCISION     POLYPECTOMY  01/10/2022   Procedure: POLYPECTOMY;  Surgeon: Marguerita Merles, Reuel Boom, MD;  [facility]: [facility];  Service: Gastroenterology;;   SUBMUCOSAL TATTOO INJECTION  01/10/2022   Procedure: SUBMUCOSAL TATTOO INJECTION;  Surgeon: Marguerita Merles, Reuel Boom, MD;   [facility]: [facility];  Service: Gastroenterology;;   SURGERY OF LIP     skin cancer    Social History: Social History   Socioeconomic History   Marital status: Married    Spouse name: Not on file   Number of children: Not on file   Years of education: Not on file   Highest education level: Not on file  Occupational History   Not on file  Tobacco Use   Smoking status: Former    Packs/day: 2.00    Years: 25.00    Additional pack years: 0.00    Total pack years: 50.00    Types: Cigarettes    Quit date: 8    Years since quitting: 29.3    Passive exposure: Past   Smokeless tobacco: Never  Vaping Use   Vaping Use: Never used  Substance and Sexual Activity   Alcohol use: Never   Drug use: Never   Sexual activity: Not on file  Other Topics Concern   Not on file  Social History Narrative   Not on file   Social Determinants of Health   Financial Resource Strain: Not on file  Food Insecurity: Not on file  Transportation Needs: Not on file  Physical Activity: Not on file  Stress: Not on file  Social Connections: Not on file  Intimate Partner Violence: Not on file    Family History: No family history on file.  Current Medications:  Current Outpatient Medications:    amitriptyline (ELAVIL) 10 MG tablet, Take 10 mg by mouth every evening., Disp: , Rfl:    amLODipine (NORVASC) 5 MG tablet, Take 5 mg by mouth 2 (two) times daily., Disp: , Rfl:    ascorbic acid (VITAMIN C/NATURAL ROSE HIPS) 1000 MG tablet, Take 1,000 mg by mouth every evening., Disp: , Rfl:    atorvastatin (LIPITOR) 40 MG tablet, Take 40 mg by mouth every evening., Disp: , Rfl:    carvedilol (COREG) 6.25 MG tablet, Take 6.25 mg by mouth 2 (two) times daily with a meal., Disp: , Rfl:    Cholecalciferol (VITAMIN D3) 125 MCG (5000 UT) CAPS, Take 5,000 Units by mouth every evening., Disp: , Rfl:    dorzolamide (TRUSOPT) 2 % ophthalmic solution, Place 1 drop into the left eye 2 (two) times daily., Disp: ,  Rfl:    ELIQUIS 5 MG TABS tablet, TAKE 1 TABLET(5 MG) BY MOUTH TWICE DAILY, Disp: 60 tablet, Rfl: 3   ferrous sulfate 325 (65 FE) MG  tablet, Take 325 mg by mouth in the morning and at bedtime. Iron Nature's Bounty, Disp: , Rfl:    FOLIC ACID PO, Take 800 mcg by mouth every evening. With B12 Now (Brand), Disp: , Rfl:    hydrALAZINE (APRESOLINE) 25 MG tablet, Take 50 mg by mouth in the morning and at bedtime., Disp: , Rfl:    irbesartan (AVAPRO) 300 MG tablet, Take 300 mg by mouth every evening., Disp: , Rfl:    KERENDIA 10 MG TABS, Take 1 tablet every day by oral route., Disp: , Rfl:    latanoprost (XALATAN) 0.005 % ophthalmic solution, Place 1 drop into both eyes at bedtime., Disp: , Rfl:    linagliptin (TRADJENTA) 5 MG TABS tablet, Take 5 mg by mouth in the morning., Disp: , Rfl:    loratadine (CLARITIN) 10 MG tablet, Take 10 mg by mouth every morning., Disp: , Rfl:    Misc Natural Products (PROSTATE SUPPORT PO), Take 1 capsule by mouth every evening. NOW Prostate Support, Disp: , Rfl:    omeprazole (PRILOSEC) 40 MG capsule, Take 1 capsule (40 mg total) by mouth daily., Disp: 90 capsule, Rfl: 3   vitamin B-12 (CYANOCOBALAMIN) 1000 MCG tablet, Take 1,000 mcg by mouth daily. Now (Brand), Disp: , Rfl:    Allergies: Allergies  Allergen Reactions   Gadobenate Nausea And Vomiting    Immediately upon the infusion of 15mL multihance contrast  Patient had exteme nausea and vomiting.   No other symptoms noted .  No injury.  MRI scan was completed after patient felt better.   Immediately upon the infusion of 15mL multihance contrast  Patient had exteme nausea and vomiting.   No other symptoms noted .  No injury.  MRI scan was completed after patient felt better.       REVIEW OF SYSTEMS:   Review of Systems  Constitutional:  Negative for chills, fatigue and fever.  HENT:   Negative for lump/mass, mouth sores, nosebleeds, sore throat and trouble swallowing.   Eyes:  Negative for eye problems.   Respiratory:  Negative for cough and shortness of breath (On exertion).   Cardiovascular:  Negative for chest pain, leg swelling and palpitations.  Gastrointestinal:  Negative for abdominal pain, constipation, diarrhea, nausea and vomiting.  Genitourinary:  Negative for bladder incontinence, difficulty urinating, dysuria, frequency, hematuria and nocturia.   Musculoskeletal:  Negative for arthralgias, back pain, flank pain, myalgias and neck pain.  Skin:  Negative for itching and rash.  Neurological:  Negative for dizziness, headaches and numbness.  Hematological:  Does not bruise/bleed easily.  Psychiatric/Behavioral:  Negative for depression, sleep disturbance and suicidal ideas. The patient is not nervous/anxious.   All other systems reviewed and are negative.    VITALS:   Blood pressure (!) 179/62, pulse (!) 56, temperature 97.6 F (36.4 C), temperature source Oral, resp. rate 18, weight 211 lb 11.2 oz (96 kg), SpO2 97 %.  Wt Readings from Last 3 Encounters:  11/16/22 211 lb 11.2 oz (96 kg)  10/26/22 210 lb (95.3 kg)  07/20/22 206 lb (93.4 kg)    Body mass index is 31.35 kg/m.  Performance status (ECOG): 1 - Symptomatic but completely ambulatory  PHYSICAL EXAM:   Physical Exam Vitals and nursing note reviewed. Exam conducted with a chaperone present.  Constitutional:      Appearance: Normal appearance.  Cardiovascular:     Rate and Rhythm: Normal rate and regular rhythm.     Pulses: Normal pulses.     Heart sounds:  Normal heart sounds.  Pulmonary:     Effort: Pulmonary effort is normal.     Breath sounds: Normal breath sounds.  Abdominal:     Palpations: Abdomen is soft. There is no hepatomegaly, splenomegaly or mass.     Tenderness: There is no abdominal tenderness.  Musculoskeletal:     Right lower leg: No edema.     Left lower leg: No edema.  Lymphadenopathy:     Cervical: No cervical adenopathy.     Right cervical: No superficial, deep or posterior cervical  adenopathy.    Left cervical: No superficial, deep or posterior cervical adenopathy.     Upper Body:     Right upper body: No supraclavicular or axillary adenopathy.     Left upper body: No supraclavicular or axillary adenopathy.  Neurological:     General: No focal deficit present.     Mental Status: He is alert and oriented to person, place, and time.  Psychiatric:        Mood and Affect: Mood normal.        Behavior: Behavior normal.     LABS:      Latest Ref Rng & Units 10/19/2022    2:02 PM 07/13/2022   11:58 AM 03/16/2022   10:03 AM  CBC  WBC 4.0 - 10.5 K/uL 6.1  7.1  6.0   Hemoglobin 13.0 - 17.0 g/dL 54.0  98.1  9.9   Hematocrit 39.0 - 52.0 % 35.3  35.3  31.2   Platelets 150 - 400 K/uL 188  178  187       Latest Ref Rng & Units 10/19/2022    2:02 PM 07/13/2022   11:58 AM 03/16/2022   10:03 AM  CMP  Glucose 70 - 99 mg/dL 191  478  295   BUN 8 - 23 mg/dL 38  34  37   Creatinine 0.61 - 1.24 mg/dL 6.21  3.08  6.57   Sodium 135 - 145 mmol/L 136  137  141   Potassium 3.5 - 5.1 mmol/L 4.1  4.5  4.1   Chloride 98 - 111 mmol/L 106  105  111   CO2 22 - 32 mmol/L 21  25  25    Calcium 8.9 - 10.3 mg/dL 8.7  9.0  9.1   Total Protein 6.5 - 8.1 g/dL 6.9  6.9  6.8   Total Bilirubin 0.3 - 1.2 mg/dL 1.3  1.1  0.7   Alkaline Phos 38 - 126 U/L 114  102  87   AST 15 - 41 U/L 28  26  27    ALT 0 - 44 U/L 36  33  34      Lab Results  Component Value Date   CEA1 20.9 (H) 10/19/2022   /  CEA  Date Value Ref Range Status  10/19/2022 20.9 (H) 0.0 - 4.7 ng/mL Final    Comment:    (NOTE)                             Nonsmokers          <3.9                             Smokers             <5.6 Roche [employer]ostics Electrochemiluminescence Immunoassay (ECLIA) Values obtained with different assay methods or kits cannot be used interchangeably.  Results cannot  be interpreted as absolute evidence of the presence or absence of malignant disease. Performed At: [facility] [address] [place], [place] [id number] Jolene Schimke MD EV:[id number]    No results found for: "PSA1" No results found for: "EXH371" No results found for: "CAN125"  Lab Results  Component Value Date   TOTALPROTELP 6.3 01/25/2022   ALBUMINELP 3.6 01/25/2022   A1GS 0.2 01/25/2022   A2GS 0.7 01/25/2022   BETS 0.9 01/25/2022   GAMS 0.9 01/25/2022   MSPIKE Not Observed 01/25/2022   SPEI Comment 01/25/2022   Lab Results  Component Value Date   TIBC 331 10/19/2022   TIBC 396 01/25/2022   FERRITIN 53 10/19/2022   FERRITIN 17 (L) 01/25/2022   IRONPCTSAT 52 (H) 10/19/2022   IRONPCTSAT 53 (H) 01/25/2022   No results found for: "LDH"   STUDIES:   NM PET Image Restag (PS) Skull Base To Thigh  Result Date: 11/13/2022 CLINICAL DATA:  Subsequent treatment strategy for colon cancer. Elevated CEA levels. EXAM: NUCLEAR MEDICINE PET SKULL BASE TO THIGH TECHNIQUE: 10.8 mCi F-18 FDG was injected intravenously. Full-ring PET imaging was performed from the skull base to thigh after the radiotracer. CT data was obtained and used for attenuation correction and anatomic localization. Fasting blood glucose: 137 mg/dl COMPARISON:  CT of the chest, abdomen and pelvis 07/13/2022 and 01/13/2022 FINDINGS: Mediastinal blood pool activity: SUV max 2.2 NECK: No hypermetabolic cervical lymph nodes are identified. No suspicious activity identified within the pharyngeal mucosal space. Incidental CT findings: There is mucous retention cyst in the left maxillary sinus and bilateral carotid atherosclerosis. CHEST: There are no hypermetabolic mediastinal, hilar or axillary lymph nodes. No hypermetabolic pulmonary activity or suspicious nodularity. Pulmonary assessment mildly limited by breathing artifact. Scattered tiny subpleural nodules in the right lung are grossly unchanged from previous CTs and considered benign. Incidental CT findings: Diffuse atherosclerosis of the aorta, great vessels and coronary arteries. There are  calcifications of the aortic valve. Scattered pulmonary scarring without acute abnormality or suspicious nodularity. ABDOMEN/PELVIS: There is a hypermetabolic metastasis inferiorly in the right hepatic lobe measuring approximately 3.2 x 2.3 cm on image 173/3 which demonstrates an SUV max of 9.3. No other hypermetabolic activity within the liver, spleen, pancreas or adrenal glands. There is no hypermetabolic nodal activity in the abdomen or pelvis. Physiologic bowel activity. No suspicious peritoneal findings. Incidental CT findings: Stable renal cysts bilaterally for which no follow-up imaging is recommended. Diffuse aortic and branch vessel atherosclerosis. Diffuse colonic diverticulosis status post partial colectomy and anastomosis in the left upper quadrant. The prostate gland is moderately enlarged. SKELETON: There is no hypermetabolic activity to suggest osseous metastatic disease. There is enlarging subcutaneous nodule posteriorly in the lower left chest which measures 1.1 cm on image 130/3. This demonstrates low level metabolic activity (SUV max 2.1). No other subcutaneous lesions are identified, and this is favored to reflect a sebaceous cyst. Incidental CT findings: Mild spondylosis. IMPRESSION: 1. Single hypermetabolic metastasis inferiorly in the right hepatic lobe. 2. No other evidence of metastatic disease. 3. Enlarging subcutaneous nodule posteriorly in the lower left chest with low level metabolic activity, favored to reflect a sebaceous cyst. Attention on follow-up recommended to exclude atypical subcutaneous metastasis. 4. Diffuse colonic diverticulosis status post partial colectomy. 5.  Aortic Atherosclerosis (ICD10-I70.0). Electronically Signed   By: Carey Bullocks M.D.   On: 11/13/2022 09:09

## 2022-11-16 ENCOUNTER — Inpatient Hospital Stay: Payer: Medicare Other | Attending: Hematology | Admitting: Hematology

## 2022-11-16 VITALS — BP 179/62 | HR 56 | Temp 97.6°F | Resp 18 | Wt 211.7 lb

## 2022-11-16 DIAGNOSIS — Z08 Encounter for follow-up examination after completed treatment for malignant neoplasm: Secondary | ICD-10-CM | POA: Insufficient documentation

## 2022-11-16 DIAGNOSIS — Z85038 Personal history of other malignant neoplasm of large intestine: Secondary | ICD-10-CM | POA: Insufficient documentation

## 2022-11-16 DIAGNOSIS — Z86718 Personal history of other venous thrombosis and embolism: Secondary | ICD-10-CM | POA: Insufficient documentation

## 2022-11-16 DIAGNOSIS — K769 Liver disease, unspecified: Secondary | ICD-10-CM

## 2022-11-16 DIAGNOSIS — D509 Iron deficiency anemia, unspecified: Secondary | ICD-10-CM | POA: Diagnosis not present

## 2022-11-16 DIAGNOSIS — Z7901 Long term (current) use of anticoagulants: Secondary | ICD-10-CM | POA: Diagnosis not present

## 2022-11-16 NOTE — Patient Instructions (Signed)
[facility] Dr. Ellin Saba.  He reviewed the results of your PET scan. It is showing a spot lighting up on the right lobe of your liver. We will arrange for you to have an MRI of the liver to see if this is the only spot on your liver. If it is, we will arrange for you to see an interventional radiologist to get an ablation done to this spot.   We will see you back after the MRI.   Return as scheduled.    Thank you for choosing [facility] at [facility] to provide your oncology and hematology care.  To afford each patient quality time with our provider, please arrive at least 15 minutes before your scheduled appointment time.   If you have a lab appointment with the Cancer Center please come in thru the Main Entrance and check in at the main information desk.  You need to re-schedule your appointment should you arrive 10 or more minutes late.  We strive to give you quality time with our providers, and arriving late affects you and other patients whose appointments are after yours.  Also, if you no show three or more times for appointments you may be dismissed from the clinic at the providers discretion.     Again, thank you for choosing [facility].  Our hope is that these requests will decrease the amount of time that you wait before being seen by our physicians.       _____________________________________________________________  Should you have questions after your visit to [facility], please contact our office at [phone] and follow the prompts.  Our office hours are 8:00 a.m. and 4:30 p.m. Monday - Friday.  Please note that voicemails left after 4:00 p.m. may not be returned until the following business day.  We are closed weekends and major holidays.  You do have access to a nurse 24-7, just call the main number to the clinic [phone]  and do not press any options, hold on the line and a nurse will answer the phone.    For prescription refill requests, have your pharmacy contact our office and allow 72 hours.    Due to Covid, you will need to wear a mask upon entering the hospital. If you do not have a mask, a mask will be given to you at the Main Entrance upon arrival. For doctor visits, patients may have 1 support person age 93 or older with them. For treatment visits, patients can not have anyone with them due to social distancing guidelines and our immunocompromised population.

## 2022-11-21 ENCOUNTER — Ambulatory Visit (HOSPITAL_COMMUNITY)
Admission: RE | Admit: 2022-11-21 | Discharge: 2022-11-21 | Disposition: A | Discharge status: Home / Self Care | Payer: Medicare Other | Source: Ambulatory Visit | Attending: Hematology | Admitting: Hematology

## 2022-11-21 ENCOUNTER — Other Ambulatory Visit: Payer: Self-pay | Admitting: *Deleted

## 2022-11-21 DIAGNOSIS — K769 Liver disease, unspecified: Secondary | ICD-10-CM

## 2022-11-21 MED ORDER — PREDNISONE 50 MG PO TABS: 50.0000 mg | ORAL_TABLET | ORAL | 0 refills | Status: DC

## 2022-11-21 MED ORDER — PREDNISONE 50 MG PO TABS: ORAL_TABLET | ORAL | 0 refills | Status: DC

## 2022-11-22 ENCOUNTER — Ambulatory Visit (HOSPITAL_COMMUNITY)
Admission: RE | Admit: 2022-11-22 | Discharge: 2022-11-22 | Disposition: A | Discharge status: Home / Self Care | Payer: Medicare Other | Source: Ambulatory Visit | Attending: Hematology | Admitting: Hematology

## 2022-11-22 DIAGNOSIS — K769 Liver disease, unspecified: Secondary | ICD-10-CM | POA: Diagnosis present

## 2022-11-22 MED ORDER — GADOBUTROL 1 MMOL/ML IV SOLN
10.0000 mL | Freq: Once | INTRAVENOUS | Status: AC | PRN
  Administered 2022-11-22: 10 mL via INTRAVENOUS

## 2022-11-22 NOTE — Progress Notes (Signed)
[facility] [address], [place] [address]    Clinic Day:  11/23/2022  Referring physician: [name], MD  Patient Care Team: Pomposini, Rande Brunt, MD as PCP - General (Internal Medicine) Herbert Patrick, MD as Referring Physician (Hematology and Oncology) [name], RN as Oncology Nurse Navigator (Medical Oncology) [name], MD as Medical Oncologist (Medical Oncology)   ASSESSMENT & PLAN:   Assessment: 1. Stage II (T3N0) sigmoid colon cancer, MMR preserved: - Anemia with hemoglobin of 8, recent admission with diverticulitis.  He also had weight loss of 40 pounds in the last 6 months. - Colonoscopy by Dr. [name] (01/10/2022): Fungating and ulcerated partially obstructing mass found at 42 cm proximal to the anus.  Mass is circumferential and measured 3 cm in length. - Pathology: Invasive moderately differentiated adenocarcinoma of the descending colon mass.  Transverse colon polypectomy shows fragments of at least intramucosal adenocarcinoma and high-grade dysplasia. - CT CAP (01/13/2022): No evidence of metastatic disease in the chest, abdomen or pelvis.  Several tiny lung nodules and scattered areas of interstitial irregularity in the right upper lobe likely postinflammatory. - Preoperative CEA (01/10/2022): 7.9. - Left hemicolectomy (02/17/2022): Moderately differentiated grade 2 adenocarcinoma, no perforation, no lymphovascular/perineural invasion, margins negative.  0/21 lymph nodes involved.  PT3 pN0.   2. Social/family history: - Lives at home with his wife.  Wife works at [employer] nephrology office in [place].  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  Quit smoking in 1995.  Smoked 1 to 2 packs/day for 25 years. - Son had DVT.  Son is also a carrier for factor V Leiden.  Mother had bladder cancer and uterine cancer.   3.  Left leg DVT: - Patient had a left leg DVT approximately 2 years ago.   His wife thinks that DVT has happened about 4 to 6 weeks after his second COVID infection.  He was very minimally symptomatic from the COVID infection.  At the time of the DVT, he was completely mobile and did not have any surgery. - Patient was heterozygous for factor V Leiden as checked by Dr. [name] on 09/20/2011. - I have recommended that he get the records of timing of DVT and the COVID test.  If it is within 4 to 6 weeks of his COVID diagnosis, we may safely discontinue anticoagulation considering it can be provoked by COVID infection.  If it is unprovoked DVT, may continue low intensity anticoagulation.   4.  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 - Adjuvant XRT 50 Gray, completed 08/11/2015.    Plan: 1.  Stage II (T3N0) sigmoid colon cancer: - PET scan on 11/09/2022: 3.2 x 2.3 cm right hepatic lobe metastasis with no other evidence of metastatic disease. - Reviewed MRI of the liver from 11/22/2022: Isolated inferior right hepatic lobe 2.8 cm. - We discussed options including surgery and liver directed therapy.  Because of his advanced age, he is interested in liver directed therapy. - I have reached out to Dr. [name] who has kindly agreed to see this patient on Wednesday to discuss ablation options. - I will schedule CT chest and MRI of the abdomen in 3 months after completion of ablation along with CEA level. - We have also discussed systemic therapy but would like to avoid because of his advanced age.   2.  Normocytic anemia: - Anemia from CKD  and functional iron deficiency. - Received Feraheme on 02/08/2022.  Ferritin is 53. - He was told to start taking iron tablet with stool softener.  Will repeat ferritin and iron panel at next visit.  If no improvement will consider another infusion of iron.   3.  Left leg DVT: - Continue Eliquis.  No bleeding issues reported.    Orders Placed This  Encounter  Procedures   CT Chest W Contrast    Standing Status:   Future    Standing Expiration Date:   11/23/2023    Order Specific Question:   If indicated for the ordered procedure, I authorize the administration of contrast media per Radiology protocol    Answer:   Yes    Order Specific Question:   Does the patient have a contrast media/X-ray dye allergy?    Answer:   Yes    Order Specific Question:   Preferred imaging location?    Answer:   [facility]    Order Specific Question:   Release to patient    Answer:   Immediate [1]   MR Abdomen W Wo Contrast    Standing Status:   Future    Standing Expiration Date:   11/23/2023    Order Specific Question:   If indicated for the ordered procedure, I authorize the administration of contrast media per Radiology protocol    Answer:   Yes    Order Specific Question:   What is the patient's sedation requirement?    Answer:   No Sedation    Order Specific Question:   Does the patient have a pacemaker or implanted devices?    Answer:   No    Order Specific Question:   Preferred imaging location?    Answer:   [facility] (table limit [phone])    Order Specific Question:   Release to patient    Answer:   Immediate   IR Radiologist Eval & Mgmt    Consult liver lesion Arrive [facility]    Standing Status:   Future    Standing Expiration Date:   11/23/2023    Order Specific Question:   Reason for Exam (SYMPTOM  OR DIAGNOSIS REQUIRED)    Answer:   Consult liver ablation    Order Specific Question:   Preferred Imaging Location?    Answer:   [facility]      I,[name],acting as a scribe for [name], MD.,have documented all relevant documentation on the behalf of [name], MD,as directed by  [name], MD while in the presence of [name], MD.   I, [name] MD, have reviewed the above documentation for accuracy and completeness, and I agree with the  above.   [name], MD   4/25/20246:32 PM  CHIEF COMPLAINT:   Diagnosis: sigmoid colon cancer and iron deficiency anemia    Cancer Staging  Cancer of sigmoid colon [facility]) Staging form: Colon and Rectum, AJCC 8th Edition - Clinical stage from 01/25/2022: Stage IIA (cT3, cN0, cM0) - Signed by [name], MD on 03/16/2022    Prior Therapy: Left hemicolectomy on 02/17/2022   Current Therapy:  Surveillance    HISTORY OF PRESENT ILLNESS:   Oncology History  Cancer of sigmoid colon (HCC)  01/25/2022 Initial Diagnosis   Cancer of sigmoid colon (HCC)   01/25/2022 Cancer Staging   Staging form: Colon and Rectum, AJCC 8th Edition - Clinical stage from 01/25/2022: Stage IIA (cT3, cN0, cM0) - Signed by [name],  MD on 03/16/2022 Histopathologic type: Adenocarcinoma, NOS Stage prefix: Initial diagnosis Total positive nodes: 0 Total nodes examined: 21 Histologic grade (G): G2 Histologic grading system: 4 grade system Microsatellite instability (MSI): Stable      INTERVAL HISTORY:   Wood is a 88 y.o. male presenting to clinic today for follow up of sigmoid colon cancer and iron deficiency anemia. He was last seen by me on 11/16/22.  Since his last visit, he underwent liver MRI yesterday, 11/22/22.  Today, he states that he is doing well overall. His appetite level is at 100%. His energy level is at 75%.  PAST MEDICAL HISTORY:   Past Medical History: Past Medical History:  Diagnosis Date   Adenomatous colon polyp    Anemia    Carotid bruit    Chronic dyspnea    Diabetes (HCC)    type 2   Diverticular disease    DVT (deep venous thrombosis) (HCC)    lower limb   Dyspnea    Factor V deficiency (HCC)    Hearing loss    bilateral   Hyperlipemia    Hypertension    Inguinal hernia    Kidney disease    stage 3   Lung nodule    Lymphadenopathy    Malignant neoplasm (HCC)    salivary gland   Murmur    Osteoarthritis    Senile purpura (HCC)      Surgical History: Past Surgical History:  Procedure Laterality Date   BIOPSY  01/10/2022   Procedure: BIOPSY;  Surgeon: Dolores Frame, MD;  [facility];  Service: Gastroenterology;;   COLONOSCOPY WITH PROPOFOL N/A 01/10/2022   Procedure: COLONOSCOPY WITH PROPOFOL;  Surgeon: Dolores Frame, MD;  [facility];  Service: Gastroenterology;  Laterality: N/A;  per Soledad Gerlach, pt to arrive at 9:15   ESOPHAGOGASTRODUODENOSCOPY (EGD) WITH PROPOFOL N/A 01/10/2022   Procedure: ESOPHAGOGASTRODUODENOSCOPY (EGD) WITH PROPOFOL;  Surgeon: Dolores Frame, MD;  [facility];  Service: Gastroenterology;  Laterality: N/A;   HAND / FINGER LESION EXCISION     HERNIA REPAIR     MASTOIDECTOMY     PILONIDAL CYST EXCISION     POLYPECTOMY  01/10/2022   Procedure: POLYPECTOMY;  Surgeon: Dolores Frame, MD;  [facility];  Service: Gastroenterology;;   SUBMUCOSAL TATTOO INJECTION  01/10/2022   Procedure: SUBMUCOSAL TATTOO INJECTION;  Surgeon: Marguerita Merles, Reuel Boom, MD;  Location: [facility];  Service: Gastroenterology;;   SURGERY OF LIP     skin cancer    Social History: Social History   Socioeconomic History   Marital status: Married    Spouse name: Not on file   Number of children: Not on file   Years of education: Not on file   Highest education level: Not on file  Occupational History   Not on file  Tobacco Use   Smoking status: Former    Packs/day: 2.00    Years: 25.00    Additional pack years: 0.00    Total pack years: 50.00    Types: Cigarettes    Quit date: 93    Years since quitting: 29.3    Passive exposure: Past   Smokeless tobacco: Never  Vaping Use   Vaping Use: Never used  Substance and Sexual Activity   Alcohol use: Never   Drug use: Never   Sexual activity: Not on file  Other Topics Concern   Not on file  Social History Narrative   Not on file  Social Determinants of  Health   Financial Resource Strain: Not on file  Food Insecurity: Not on file  Transportation Needs: Not on file  Physical Activity: Not on file  Stress: Not on file  Social Connections: Not on file  Intimate Partner Violence: Not on file    Family History: No family history on file.  Current Medications:  Current Outpatient Medications:    amitriptyline (ELAVIL) 10 MG tablet, Take 10 mg by mouth every evening., Disp: , Rfl:    amLODipine (NORVASC) 5 MG tablet, Take 5 mg by mouth 2 (two) times daily., Disp: , Rfl:    ascorbic acid (VITAMIN C/NATURAL ROSE HIPS) 1000 MG tablet, Take 1,000 mg by mouth every evening., Disp: , Rfl:    atorvastatin (LIPITOR) 40 MG tablet, Take 40 mg by mouth every evening., Disp: , Rfl:    carvedilol (COREG) 6.25 MG tablet, Take 6.25 mg by mouth 2 (two) times daily with a meal., Disp: , Rfl:    Cholecalciferol (VITAMIN D3) 125 MCG (5000 UT) CAPS, Take 5,000 Units by mouth every evening., Disp: , Rfl:    dorzolamide (TRUSOPT) 2 % ophthalmic solution, Place 1 drop into the left eye 2 (two) times daily., Disp: , Rfl:    ELIQUIS 5 MG TABS tablet, TAKE 1 TABLET(5 MG) BY MOUTH TWICE DAILY, Disp: 60 tablet, Rfl: 3   ferrous sulfate 325 (65 FE) MG tablet, Take 325 mg by mouth in the morning and at bedtime. Iron Nature's Bounty, Disp: , Rfl:    FOLIC ACID PO, Take 800 mcg by mouth every evening. With B12 Now (Brand), Disp: , Rfl:    hydrALAZINE (APRESOLINE) 25 MG tablet, Take 50 mg by mouth in the morning and at bedtime., Disp: , Rfl:    irbesartan (AVAPRO) 300 MG tablet, Take 300 mg by mouth every evening., Disp: , Rfl:    KERENDIA 10 MG TABS, Take 1 tablet every day by oral route., Disp: , Rfl:    latanoprost (XALATAN) 0.005 % ophthalmic solution, Place 1 drop into both eyes at bedtime., Disp: , Rfl:    linagliptin (TRADJENTA) 5 MG TABS tablet, Take 5 mg by mouth in the morning., Disp: , Rfl:    loratadine (CLARITIN) 10 MG tablet, Take 10 mg by mouth every  morning., Disp: , Rfl:    Misc Natural Products (PROSTATE SUPPORT PO), Take 1 capsule by mouth every evening. NOW Prostate Support, Disp: , Rfl:    omeprazole (PRILOSEC) 40 MG capsule, Take 1 capsule (40 mg total) by mouth daily., Disp: 90 capsule, Rfl: 3   predniSONE (DELTASONE) 50 MG tablet, Take 1 tablet 13 hours prior to CT Take 1 tablet   7 hours prior to CT Take 1 tablet   1 hour prior to CT Take 50 mg of Benadryl 1 hour prior to CT, Disp: 3 tablet, Rfl: 0   vitamin B-12 (CYANOCOBALAMIN) 1000 MCG tablet, Take 1,000 mcg by mouth daily. Now (Brand), Disp: , Rfl:    Allergies: Allergies  Allergen Reactions   Gadobenate Nausea And Vomiting    Immediately upon the infusion of 15mL multihance contrast  Patient had exteme nausea and vomiting.   No other symptoms noted .  No injury.  MRI scan was completed after patient felt better.   Immediately upon the infusion of 15mL multihance contrast  Patient had exteme nausea and vomiting.   No other symptoms noted .  No injury.  MRI scan was completed after patient felt better.  REVIEW OF SYSTEMS:   Review of Systems  Constitutional:  Negative for chills, fatigue and fever.  HENT:   Negative for lump/mass, mouth sores, nosebleeds, sore throat and trouble swallowing.   Eyes:  Negative for eye problems.  Respiratory:  Negative for cough and shortness of breath.   Cardiovascular:  Negative for chest pain, leg swelling and palpitations.  Gastrointestinal:  Negative for abdominal pain, constipation, diarrhea, nausea and vomiting.  Genitourinary:  Negative for bladder incontinence, difficulty urinating, dysuria, frequency, hematuria and nocturia.   Musculoskeletal:  Negative for arthralgias, back pain, flank pain, myalgias and neck pain.  Skin:  Negative for itching and rash.  Neurological:  Positive for dizziness and numbness. Negative for headaches.  Hematological:  Does not bruise/bleed easily.  Psychiatric/Behavioral:  Negative for depression,  sleep disturbance and suicidal ideas. The patient is not nervous/anxious.   All other systems reviewed and are negative.    VITALS:   Blood pressure (!) 168/52, pulse (!) 57, temperature (!) 97.5 F (36.4 C), temperature source Oral, resp. rate 18, height 5\' 10"  (1.778 m), weight 217 lb 4.8 oz (98.6 kg), SpO2 97 %.  Wt Readings from Last 3 Encounters:  11/23/22 217 lb 4.8 oz (98.6 kg)  11/16/22 211 lb 11.2 oz (96 kg)  10/26/22 210 lb (95.3 kg)    Body mass index is 31.18 kg/m.  Performance status (ECOG): 1 - Symptomatic but completely ambulatory  PHYSICAL EXAM:   Physical Exam Vitals and nursing note reviewed. Exam conducted with a chaperone present.  Constitutional:      Appearance: Normal appearance.  Cardiovascular:     Rate and Rhythm: Normal rate and regular rhythm.     Pulses: Normal pulses.     Heart sounds: Normal heart sounds.  Pulmonary:     Effort: Pulmonary effort is normal.     Breath sounds: Normal breath sounds.  Abdominal:     Palpations: Abdomen is soft. There is no hepatomegaly, splenomegaly or mass.     Tenderness: There is no abdominal tenderness.  Musculoskeletal:     Right lower leg: No edema.     Left lower leg: No edema.  Lymphadenopathy:     Cervical: No cervical adenopathy.     Right cervical: No superficial, deep or posterior cervical adenopathy.    Left cervical: No superficial, deep or posterior cervical adenopathy.     Upper Body:     Right upper body: No supraclavicular or axillary adenopathy.     Left upper body: No supraclavicular or axillary adenopathy.  Neurological:     General: No focal deficit present.     Mental Status: He is alert and oriented to person, place, and time.  Psychiatric:        Mood and Affect: Mood normal.        Behavior: Behavior normal.     LABS:      Latest Ref Rng & Units 10/19/2022    2:02 PM 07/13/2022   11:58 AM 03/16/2022   10:03 AM  CBC  WBC 4.0 - 10.5 K/uL 6.1  7.1  6.0   Hemoglobin 13.0 -  17.0 g/dL 96.0  45.4  9.9   Hematocrit 39.0 - 52.0 % 35.3  35.3  31.2   Platelets 150 - 400 K/uL 188  178  187       Latest Ref Rng & Units 10/19/2022    2:02 PM 07/13/2022   11:58 AM 03/16/2022   10:03 AM  CMP  Glucose 70 - 99 mg/dL  137  115  126   BUN 8 - 23 mg/dL 38  34  37   Creatinine 0.61 - 1.24 mg/dL 1.61  0.96  0.45   Sodium 135 - 145 mmol/L 136  137  141   Potassium 3.5 - 5.1 mmol/L 4.1  4.5  4.1   Chloride 98 - 111 mmol/L 106  105  111   CO2 22 - 32 mmol/L 21  25  25    Calcium 8.9 - 10.3 mg/dL 8.7  9.0  9.1   Total Protein 6.5 - 8.1 g/dL 6.9  6.9  6.8   Total Bilirubin 0.3 - 1.2 mg/dL 1.3  1.1  0.7   Alkaline Phos 38 - 126 U/L 114  102  87   AST 15 - 41 U/L 28  26  27    ALT 0 - 44 U/L 36  33  34      Lab Results  Component Value Date   CEA1 20.9 (H) 10/19/2022   /  CEA  Date Value Ref Range Status  10/19/2022 20.9 (H) 0.0 - 4.7 ng/mL Final    Comment:    (NOTE)                             Nonsmokers          <3.9                             Smokers             <5.6 Roche [employer] Electrochemiluminescence Immunoassay (ECLIA) Values obtained with different assay methods or kits cannot be used interchangeably.  Results cannot be interpreted as absolute evidence of the presence or absence of malignant disease. Performed At: [facility] [address] [place], [place] [address] Jolene Schimke MD NW:[id number]    No results found for: "PSA1" No results found for: "VHQ469" No results found for: "CAN125"  Lab Results  Component Value Date   TOTALPROTELP 6.3 01/25/2022   ALBUMINELP 3.6 01/25/2022   A1GS 0.2 01/25/2022   A2GS 0.7 01/25/2022   BETS 0.9 01/25/2022   GAMS 0.9 01/25/2022   MSPIKE Not Observed 01/25/2022   SPEI Comment 01/25/2022   Lab Results  Component Value Date   TIBC 331 10/19/2022   TIBC 396 01/25/2022   FERRITIN 53 10/19/2022   FERRITIN 17 (L) 01/25/2022   IRONPCTSAT 52 (H) 10/19/2022   IRONPCTSAT 53 (H) 01/25/2022    No results found for: "LDH"   STUDIES:   MR LIVER W WO CONTRAST  Result Date: 11/22/2022 CLINICAL DATA:  History of colon cancer with hypermetabolic lesion in the right hepatic lobe. EXAM: MRI ABDOMEN WITHOUT AND WITH CONTRAST TECHNIQUE: Multiplanar multisequence MR imaging of the abdomen was performed both before and after the administration of intravenous contrast. CONTRAST:  10mL GADAVIST GADOBUTROL 1 MMOL/ML IV SOLN COMPARISON:  11/09/2022 PET.  07/13/2022 CTs. FINDINGS: Portions of exam are mildly motion degraded. Lower chest: Normal heart size without pericardial or pleural effusion. Hepatobiliary: No cirrhosis. Corresponding to the CT abnormality, within the inferior right hepatic lobe, is a mildly T2 hyperintense, hypoenhancing lesion with restricted diffusion consistent with a metastasis. Example 2.8 by 2.2 cm on 49/4. 2.8 cm craniocaudal on coronal image 34/18. Normal gallbladder, without biliary ductal dilatation. Pancreas:  Normal, without mass or ductal dilatation. Spleen:  Normal in size, without focal abnormality. Adrenals/Urinary Tract: Normal adrenal glands. No  enhancing renal lesions. An interpolar right renal 1.4 cm lesion demonstrates precontrast T1 hyperintensity on 49/11, consistent with a hemorrhagic/proteinaceous cyst. Other lesions, including at up to 4.9 cm in the interpolar right kidney, are consistent with simple cysts and do not warrant imaging follow-up. No hydronephrosis. Stomach/Bowel: The proximal stomach is underdistended but appears thick walled, including on 24/14. Colonic diverticulosis. Normal abdominal small bowel. Vascular/Lymphatic: Aortic atherosclerosis. No retroperitoneal or retrocrural adenopathy. Other:  No ascites. Musculoskeletal: No acute osseous abnormality. IMPRESSION: 1. Mildly motion degraded exam. 2. Isolated inferior right hepatic lobe 2.8 cm metastasis. 3. Apparent proximal gastric wall thickening is at least partially due to underdistention.  Correlate with symptoms of gastritis. 4.  Aortic Atherosclerosis (ICD10-I70.0). Electronically Signed   By: Jeronimo Greaves M.D.   On: 11/22/2022 18:11   NM PET Image Restag (PS) Skull Base To Thigh  Result Date: 11/13/2022 CLINICAL DATA:  Subsequent treatment strategy for colon cancer. Elevated CEA levels. EXAM: NUCLEAR MEDICINE PET SKULL BASE TO THIGH TECHNIQUE: 10.8 mCi F-18 FDG was injected intravenously. Full-ring PET imaging was performed from the skull base to thigh after the radiotracer. CT data was obtained and used for attenuation correction and anatomic localization. Fasting blood glucose: 137 mg/dl COMPARISON:  CT of the chest, abdomen and pelvis 07/13/2022 and 01/13/2022 FINDINGS: Mediastinal blood pool activity: SUV max 2.2 NECK: No hypermetabolic cervical lymph nodes are identified. No suspicious activity identified within the pharyngeal mucosal space. Incidental CT findings: There is mucous retention cyst in the left maxillary sinus and bilateral carotid atherosclerosis. CHEST: There are no hypermetabolic mediastinal, hilar or axillary lymph nodes. No hypermetabolic pulmonary activity or suspicious nodularity. Pulmonary assessment mildly limited by breathing artifact. Scattered tiny subpleural nodules in the right lung are grossly unchanged from previous CTs and considered benign. Incidental CT findings: Diffuse atherosclerosis of the aorta, great vessels and coronary arteries. There are calcifications of the aortic valve. Scattered pulmonary scarring without acute abnormality or suspicious nodularity. ABDOMEN/PELVIS: There is a hypermetabolic metastasis inferiorly in the right hepatic lobe measuring approximately 3.2 x 2.3 cm on image 173/3 which demonstrates an SUV max of 9.3. No other hypermetabolic activity within the liver, spleen, pancreas or adrenal glands. There is no hypermetabolic nodal activity in the abdomen or pelvis. Physiologic bowel activity. No suspicious peritoneal findings.  Incidental CT findings: Stable renal cysts bilaterally for which no follow-up imaging is recommended. Diffuse aortic and branch vessel atherosclerosis. Diffuse colonic diverticulosis status post partial colectomy and anastomosis in the left upper quadrant. The prostate gland is moderately enlarged. SKELETON: There is no hypermetabolic activity to suggest osseous metastatic disease. There is enlarging subcutaneous nodule posteriorly in the lower left chest which measures 1.1 cm on image 130/3. This demonstrates low level metabolic activity (SUV max 2.1). No other subcutaneous lesions are identified, and this is favored to reflect a sebaceous cyst. Incidental CT findings: Mild spondylosis. IMPRESSION: 1. Single hypermetabolic metastasis inferiorly in the right hepatic lobe. 2. No other evidence of metastatic disease. 3. Enlarging subcutaneous nodule posteriorly in the lower left chest with low level metabolic activity, favored to reflect a sebaceous cyst. Attention on follow-up recommended to exclude atypical subcutaneous metastasis. 4. Diffuse colonic diverticulosis status post partial colectomy. 5.  Aortic Atherosclerosis (ICD10-I70.0). Electronically Signed   By: Carey Bullocks M.D.   On: 11/13/2022 09:09

## 2022-11-23 ENCOUNTER — Other Ambulatory Visit: Payer: Self-pay

## 2022-11-23 ENCOUNTER — Inpatient Hospital Stay (HOSPITAL_BASED_OUTPATIENT_CLINIC_OR_DEPARTMENT_OTHER): Payer: Medicare Other | Admitting: Hematology

## 2022-11-23 VITALS — BP 168/52 | HR 57 | Temp 97.5°F | Resp 18 | Ht 70.0 in | Wt 217.3 lb

## 2022-11-23 DIAGNOSIS — K769 Liver disease, unspecified: Secondary | ICD-10-CM

## 2022-11-23 DIAGNOSIS — C187 Malignant neoplasm of sigmoid colon: Secondary | ICD-10-CM | POA: Diagnosis not present

## 2022-11-23 DIAGNOSIS — Z08 Encounter for follow-up examination after completed treatment for malignant neoplasm: Secondary | ICD-10-CM | POA: Diagnosis not present

## 2022-11-23 NOTE — Patient Instructions (Signed)
[facility] at [facility] Discharge Instructions   You were seen and examined today by Dr. Ellin Saba.  He reviewed the results of your MRI. It is showing there is a solitary mass on the liver.   We will make a referral to interventional radiology for local treatment to this area of the liver.    Thank you for choosing  Cancer Center at [facility] to provide your oncology and hematology care.  To afford each patient quality time with our provider, please arrive at least 15 minutes before your scheduled appointment time.   If you have a lab appointment with the Cancer Center please come in thru the Main Entrance and check in at the main information desk.  You need to re-schedule your appointment should you arrive 10 or more minutes late.  We strive to give you quality time with our providers, and arriving late affects you and other patients whose appointments are after yours.  Also, if you no show three or more times for appointments you may be dismissed from the clinic at the providers discretion.     Again, thank you for choosing [facility].  Our hope is that these requests will decrease the amount of time that you wait before being seen by our physicians.       _____________________________________________________________  Should you have questions after your visit to [facility], please contact our office at [phone] and follow the prompts.  Our office hours are 8:00 a.m. and 4:30 p.m. Monday - Friday.  Please note that voicemails left after 4:00 p.m. may not be returned until the following business day.  We are closed weekends and major holidays.  You do have access to a nurse 24-7, just call the main number to the clinic [phone] and do not press any options, hold on the line and a nurse will answer the phone.    For prescription refill requests, have your pharmacy contact our office and allow 72 hours.    Due  to Covid, you will need to wear a mask upon entering the hospital. If you do not have a mask, a mask will be given to you at the Main Entrance upon arrival. For doctor visits, patients may have 1 support person age 57 or older with them. For treatment visits, patients can not have anyone with them due to social distancing guidelines and our immunocompromised population.

## 2022-11-23 NOTE — Progress Notes (Signed)
Referral for IR order placed per Dr. Ellin Saba verbal order.

## 2022-11-29 ENCOUNTER — Ambulatory Visit
Admission: RE | Admit: 2022-11-29 | Discharge: 2022-11-29 | Disposition: A | Discharge status: Home / Self Care | Payer: Medicare Other | Source: Ambulatory Visit | Attending: Hematology | Admitting: Hematology

## 2022-11-29 DIAGNOSIS — K769 Liver disease, unspecified: Secondary | ICD-10-CM

## 2022-11-29 NOTE — Consult Note (Signed)
Chief Complaint: Patient was seen in consultation today for liver ablation at the request of Katragadda,Sreedhar  Referring Physician(s): Katragadda,Sreedhar  History of Present Illness: Herbert Wood is a 88 y.o. 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 Angelena Form on 02/17/22 with pathology demonstrating a 5.8 x 4.2 cm mass, no perforation or lymphovascular invasion and 0/21 lymph nodes involved. He has been followed by Dr. Ellin Saba with initial post-op CEA of 7.9. During surveillance, CEA increased to 20.9 on 10/19/22, prompting further imaging.  PET scan on 11/09/2022 demonstrated a solitary hypermetabolic metastasis in the inferior aspect of the right lobe of the liver measuring approximately 3 cm.  MRI of the abdomen on 11/22/2022 demonstrated the same lesion measuring approximately 2.8 x 2.2 x 2.8 cm within the inferior tip of the right lobe.  No other hepatic metastatic lesions were identified.  No other metastatic disease was identified in the abdomen.  Herbert Wood is asymptomatic.  His appetite is good and he has not lost any weight.  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  Past Medical History:  Diagnosis Date   Adenomatous colon polyp    Anemia    Carotid bruit    Chronic dyspnea    Diabetes (HCC)    type 2   Diverticular disease    DVT (deep venous thrombosis) (HCC)    lower limb   Dyspnea    Factor V deficiency (HCC)    Hearing loss    bilateral   Hyperlipemia    Hypertension    Inguinal hernia    Kidney disease    stage 3   Lung nodule    Lymphadenopathy    Malignant neoplasm (HCC)    salivary gland   Murmur    Osteoarthritis    Senile purpura (HCC)     Past Surgical History:  Procedure Laterality Date   BIOPSY  01/10/2022   Procedure: BIOPSY;  Surgeon: Dolores Frame, MD;  [facility]: [facility];  Service:  Gastroenterology;;   COLONOSCOPY WITH PROPOFOL N/A 01/10/2022   Procedure: COLONOSCOPY WITH PROPOFOL;  Surgeon: Dolores Frame, MD;  [facility]: [facility];  Service: Gastroenterology;  Laterality: N/A;  per Soledad Gerlach, pt to arrive at 9:15   ESOPHAGOGASTRODUODENOSCOPY (EGD) WITH PROPOFOL N/A 01/10/2022   Procedure: ESOPHAGOGASTRODUODENOSCOPY (EGD) WITH PROPOFOL;  Surgeon: Dolores Frame, MD;  [facility]: [facility];  Service: Gastroenterology;  Laterality: N/A;   HAND / FINGER LESION EXCISION     HERNIA REPAIR     MASTOIDECTOMY     PILONIDAL CYST EXCISION     POLYPECTOMY  01/10/2022   Procedure: POLYPECTOMY;  Surgeon: Dolores Frame, MD;  [facility]: [facility];  Service: Gastroenterology;;   SUBMUCOSAL TATTOO INJECTION  01/10/2022   Procedure: SUBMUCOSAL TATTOO INJECTION;  Surgeon: Dolores Frame, MD;  [facility]: [facility];  Service: Gastroenterology;;   SURGERY OF LIP     skin cancer    Allergies: Gadobenate  Medications: Prior to Admission medications   Medication Sig Start Date End Date Taking? Authorizing Provider  amitriptyline (ELAVIL) 10 MG tablet Take 10 mg by mouth every evening.    [provider]  amLODipine (NORVASC) 5 MG tablet Take 5 mg by mouth 2 (two) times daily.    [provider]  ascorbic acid (VITAMIN C/NATURAL ROSE HIPS) 1000 MG tablet Take 1,000  mg by mouth every evening.    [provider]  atorvastatin (LIPITOR) 40 MG tablet Take 40 mg by mouth every evening.    [provider]  carvedilol (COREG) 6.25 MG tablet Take 6.25 mg by mouth 2 (two) times daily with a meal.    [provider]  Cholecalciferol (VITAMIN D3) 125 MCG (5000 UT) CAPS Take 5,000 Units by mouth every evening.    [provider]  dorzolamide (TRUSOPT) 2 % ophthalmic solution Place 1 drop into the left eye 2 (two) times daily.    [provider]  ELIQUIS 5 MG TABS tablet TAKE 1  TABLET(5 MG) BY MOUTH TWICE DAILY 10/05/22   [name], MD  ferrous sulfate 325 (65 FE) MG tablet Take 325 mg by mouth in the morning and at bedtime. Iron Nature's Bounty    [provider]  FOLIC ACID PO Take 800 mcg by mouth every evening. With B12 Now (Brand)    [provider]  hydrALAZINE (APRESOLINE) 25 MG tablet Take 50 mg by mouth in the morning and at bedtime.    [provider]  irbesartan (AVAPRO) 300 MG tablet Take 300 mg by mouth every evening. 10/03/21   [provider]  KERENDIA 10 MG TABS Take 1 tablet every day by oral route.    [provider]  latanoprost (XALATAN) 0.005 % ophthalmic solution Place 1 drop into both eyes at bedtime.    [provider]  linagliptin (TRADJENTA) 5 MG TABS tablet Take 5 mg by mouth in the morning.    [provider]  loratadine (CLARITIN) 10 MG tablet Take 10 mg by mouth every morning.    [provider]  Misc Natural Products (PROSTATE SUPPORT PO) Take 1 capsule by mouth every evening. NOW Prostate Support    [provider]  omeprazole (PRILOSEC) 40 MG capsule Take 1 capsule (40 mg total) by mouth daily. 01/10/22   Dolores Frame, MD  predniSONE (DELTASONE) 50 MG tablet Take 1 tablet 13 hours prior to CT Take 1 tablet   7 hours prior to CT Take 1 tablet   1 hour prior to CT Take 50 mg of Benadryl 1 hour prior to CT 11/21/22   [name], MD  vitamin B-12 (CYANOCOBALAMIN) 1000 MCG tablet Take 1,000 mcg by mouth daily. Now Charlsie Quest)    [provider]     No family history on file.  Social History   Socioeconomic History   Marital status: Married    Spouse name: Not on file   Number of children: Not on file   Years of education: Not on file   Highest education level: Not on file  Occupational History   Not on file  Tobacco Use   Smoking status: Former    Packs/day: 2.00    Years: 25.00    Additional pack years: 0.00    Total  pack years: 50.00    Types: Cigarettes    Quit date: 2    Years since quitting: 29.3    Passive exposure: Past   Smokeless tobacco: Never  Vaping Use   Vaping Use: Never used  Substance and Sexual Activity   Alcohol use: Never   Drug use: Never   Sexual activity: Not on file  Other Topics Concern   Not on file  Social History Narrative   Not on file   Social Determinants of Health   Financial Resource Strain: Not on file  Food Insecurity: Not on file  Transportation Needs: Not on file  Physical Activity: Not on file  Stress: Not on file  Social Connections: Not on file    ECOG Status: 0 - Asymptomatic  Review of Systems: A 12 point ROS discussed and pertinent positives are indicated in the HPI above.  All other systems are negative.  Review of Systems  Constitutional: Negative.   Respiratory: Negative.    Cardiovascular: Negative.   Gastrointestinal: Negative.   Genitourinary: Negative.   Musculoskeletal: Negative.   Neurological: Negative.     Vital Signs: BP (!) 160/59 (BP Location: Left Arm, Patient Position: Sitting, Cuff Size: Normal)   Pulse (!) 48   Temp 97.7 F (36.5 C) (Oral)   Resp 14   SpO2 97%    Physical Exam Vitals reviewed.  Constitutional:      General: He is not in acute distress.    Appearance: Normal appearance. He is not ill-appearing, toxic-appearing or diaphoretic.  Cardiovascular:     Rate and Rhythm: Normal rate and regular rhythm.     Heart sounds: Murmur heard.     No friction rub. No gallop.     Comments: 1-2/6 SEM Pulmonary:     Effort: Pulmonary effort is normal. No respiratory distress.     Breath sounds: Normal breath sounds. No stridor. No wheezing, rhonchi or rales.  Abdominal:     General: Bowel sounds are normal. There is no distension.     Palpations: Abdomen is soft. There is no mass.     Tenderness: There is no abdominal tenderness. There is no guarding or rebound.     Hernia: No hernia is present.   Musculoskeletal:        General: No swelling.  Skin:    General: Skin is warm and dry.     Coloration: Skin is not jaundiced.  Neurological:     General: No focal deficit present.     Mental Status: He is alert and oriented to person, place, and time.      Imaging: MR LIVER W WO CONTRAST  Result Date: 11/22/2022 CLINICAL DATA:  History of colon cancer with hypermetabolic lesion in the right hepatic lobe. EXAM: MRI ABDOMEN WITHOUT AND WITH CONTRAST TECHNIQUE: Multiplanar multisequence MR imaging of the abdomen was performed both before and after the administration of intravenous contrast. CONTRAST:  10mL GADAVIST GADOBUTROL 1 MMOL/ML IV SOLN COMPARISON:  11/09/2022 PET.  07/13/2022 CTs. FINDINGS: Portions of exam are mildly motion degraded. Lower chest: Normal heart size without pericardial or pleural effusion. Hepatobiliary: No cirrhosis. Corresponding to the CT abnormality, within the inferior right hepatic lobe, is a mildly T2 hyperintense, hypoenhancing lesion with restricted diffusion consistent with a metastasis. Example 2.8 by 2.2 cm on 49/4. 2.8 cm craniocaudal on coronal image 34/18. Normal gallbladder, without biliary ductal dilatation. Pancreas:  Normal, without mass or ductal dilatation. Spleen:  Normal in size, without focal abnormality. Adrenals/Urinary Tract: Normal adrenal glands. No enhancing renal lesions. An interpolar right renal 1.4 cm lesion demonstrates precontrast T1 hyperintensity on 49/11, consistent with a hemorrhagic/proteinaceous cyst. Other lesions, including at up to 4.9 cm in the interpolar right kidney, are consistent with simple cysts and do not warrant imaging follow-up. No hydronephrosis. Stomach/Bowel: The proximal stomach is underdistended but appears thick walled, including on 24/14. Colonic diverticulosis. Normal abdominal small bowel. Vascular/Lymphatic: Aortic atherosclerosis. No retroperitoneal or retrocrural adenopathy. Other:  No ascites. Musculoskeletal:  No acute osseous abnormality. IMPRESSION: 1. Mildly motion degraded exam. 2. Isolated inferior right hepatic lobe 2.8 cm metastasis. 3. Apparent  proximal gastric wall thickening is at least partially due to underdistention. Correlate with symptoms of gastritis. 4.  Aortic Atherosclerosis (ICD10-I70.0). Electronically Signed   By: Jeronimo Greaves M.D.   On: 11/22/2022 18:11   NM PET Image Restag (PS) Skull Base To Thigh  Result Date: 11/13/2022 CLINICAL DATA:  Subsequent treatment strategy for colon cancer. Elevated CEA levels. EXAM: NUCLEAR MEDICINE PET SKULL BASE TO THIGH TECHNIQUE: 10.8 mCi F-18 FDG was injected intravenously. Full-ring PET imaging was performed from the skull base to thigh after the radiotracer. CT data was obtained and used for attenuation correction and anatomic localization. Fasting blood glucose: 137 mg/dl COMPARISON:  CT of the chest, abdomen and pelvis 07/13/2022 and 01/13/2022 FINDINGS: Mediastinal blood pool activity: SUV max 2.2 NECK: No hypermetabolic cervical lymph nodes are identified. No suspicious activity identified within the pharyngeal mucosal space. Incidental CT findings: There is mucous retention cyst in the left maxillary sinus and bilateral carotid atherosclerosis. CHEST: There are no hypermetabolic mediastinal, hilar or axillary lymph nodes. No hypermetabolic pulmonary activity or suspicious nodularity. Pulmonary assessment mildly limited by breathing artifact. Scattered tiny subpleural nodules in the right lung are grossly unchanged from previous CTs and considered benign. Incidental CT findings: Diffuse atherosclerosis of the aorta, great vessels and coronary arteries. There are calcifications of the aortic valve. Scattered pulmonary scarring without acute abnormality or suspicious nodularity. ABDOMEN/PELVIS: There is a hypermetabolic metastasis inferiorly in the right hepatic lobe measuring approximately 3.2 x 2.3 cm on image 173/3 which demonstrates an SUV max of 9.3.  No other hypermetabolic activity within the liver, spleen, pancreas or adrenal glands. There is no hypermetabolic nodal activity in the abdomen or pelvis. Physiologic bowel activity. No suspicious peritoneal findings. Incidental CT findings: Stable renal cysts bilaterally for which no follow-up imaging is recommended. Diffuse aortic and branch vessel atherosclerosis. Diffuse colonic diverticulosis status post partial colectomy and anastomosis in the left upper quadrant. The prostate gland is moderately enlarged. SKELETON: There is no hypermetabolic activity to suggest osseous metastatic disease. There is enlarging subcutaneous nodule posteriorly in the lower left chest which measures 1.1 cm on image 130/3. This demonstrates low level metabolic activity (SUV max 2.1). No other subcutaneous lesions are identified, and this is favored to reflect a sebaceous cyst. Incidental CT findings: Mild spondylosis. IMPRESSION: 1. Single hypermetabolic metastasis inferiorly in the right hepatic lobe. 2. No other evidence of metastatic disease. 3. Enlarging subcutaneous nodule posteriorly in the lower left chest with low level metabolic activity, favored to reflect a sebaceous cyst. Attention on follow-up recommended to exclude atypical subcutaneous metastasis. 4. Diffuse colonic diverticulosis status post partial colectomy. 5.  Aortic Atherosclerosis (ICD10-I70.0). Electronically Signed   By: Carey Bullocks M.D.   On: 11/13/2022 09:09    Labs:  CBC: Recent Labs    02/20/22 0434 03/16/22 1003 07/13/22 1158 10/19/22 1402  WBC 13.8* 6.0 7.1 6.1  HGB 9.5* 9.9* 11.8* 12.0*  HCT 29.6* 31.2* 35.3* 35.3*  PLT 188 187 178 188    COAGS: No results for input(s): "INR", "APTT" in the last 8760 hours.  BMP: Recent Labs    02/20/22 0434 03/16/22 1003 07/13/22 1158 10/19/22 1402  NA 134* 141 137 136  K 4.3 4.1 4.5 4.1  CL 105 111 105 106  CO2 23 25 25  21*  GLUCOSE 119* 126* 115* 137*  BUN 23 37* 34* 38*  CALCIUM  8.6* 9.1 9.0 8.7*  CREATININE 1.23 1.38* 1.26* 1.34*  GFRNONAA 58* 50* 56* 52*    LIVER FUNCTION TESTS:  Recent Labs    02/17/22 0940 03/16/22 1003 07/13/22 1158 10/19/22 1402  BILITOT 1.0 0.7 1.1 1.3*  AST 17 27 26 28   ALT 18 34 33 36  ALKPHOS 79 87 102 114  PROT 6.4* 6.8 6.9 6.9  ALBUMIN 3.6 3.6 3.6 3.6    TUMOR MARKERS: CEA 20.9 on 10/29/22  Assessment and Plan:  Met with Herbert Wood and his wife.  We reviewed imaging findings which demonstrate a solitary metastatic lesion within the very inferior most tip of the right lobe of the liver measuring approximately 3 cm in greatest diameter.  This is in a relatively subcapsular location but is amenable to percutaneous thermal ablation based on location and size.  We reviewed details of percutaneous microwave thermal ablation including need for general anesthesia and probable overnight observation.  After reviewing imaging findings and treatment options, Herbert Wood would like to pursue percutaneous ablation.  We will begin the authorization and scheduling process.  The procedure will be performed at [facility].  Thank you for this interesting consult.  I greatly enjoyed meeting Herbert Wood and look forward to participating in their care.  A copy of this report was sent to the requesting provider on this date.  Electronically Signed: Reola Calkins 11/29/2022, 4:53 PM    I spent a total of 40 Minutes  in face to face in clinical consultation, greater than 50% of which was counseling/coordinating care for metastatic colon carcinoma to the liver.

## 2022-12-01 NOTE — Progress Notes (Signed)
Call received from radiology requesting clearance for patient to hold Eliquis two days prior to liver ablation. Patient discussed with Dr. Ellin Saba who is agreeable. Patient to hold eliquis two days prior to liver ablation per Dr. Ellin Saba. Radiology aware.

## 2022-12-05 ENCOUNTER — Other Ambulatory Visit: Payer: Self-pay | Admitting: Interventional Radiology

## 2022-12-05 DIAGNOSIS — K769 Liver disease, unspecified: Secondary | ICD-10-CM

## 2022-12-07 ENCOUNTER — Encounter (INDEPENDENT_AMBULATORY_CARE_PROVIDER_SITE_OTHER): Payer: Self-pay | Admitting: *Deleted

## 2022-12-13 NOTE — Patient Instructions (Signed)
DUE TO COVID-19 ONLY TWO VISITORS  (aged 86 and older)  ARE ALLOWED TO COME WITH YOU AND STAY IN THE WAITING ROOM ONLY DURING PRE OP AND PROCEDURE.   **NO VISITORS ARE ALLOWED IN THE SHORT STAY AREA OR RECOVERY ROOM!!**  IF YOU WILL BE ADMITTED INTO THE HOSPITAL YOU ARE ALLOWED ONLY FOUR SUPPORT PEOPLE DURING VISITATION HOURS ONLY (7 AM -8PM)   The support person(s) must pass our screening, gel in and out, and wear a mask at all times, including in the patient's room. Patients must also wear a mask when staff or their support person are in the room. Visitors GUEST BADGE MUST BE WORN VISIBLY  One adult visitor may remain with you overnight and MUST be in the room by 8 P.M.     Your procedure is scheduled on: 12/27/22   Report to [facility] Main Entrance    Report to admitting at : 6:15 AM   Call this number if you have problems the morning of surgery [phone]   Do not eat food :After Midnight.    Oral Hygiene is also important to reduce your risk of infection.                                    Remember - BRUSH YOUR TEETH THE MORNING OF SURGERY WITH YOUR REGULAR TOOTHPASTE  DENTURES WILL BE REMOVED PRIOR TO SURGERY PLEASE DO NOT APPLY "Poly grip" OR ADHESIVES!!!   Do NOT smoke after Midnight   Take these medicines the morning of surgery with A SIP OF WATER: loratadine,hydralazine,carvedilol,amlodipine,prednisone,kerendia,omeprazole.Eye drops as usual.  DO NOT TAKE ANY ORAL DIABETIC MEDICATIONS DAY OF YOUR SURGERY                              You may not have any metal on your body including hair pins, jewelry, and body piercing             Do not wear lotions, powders, perfumes/cologne, or deodorant              Men may shave face and neck.   Do not bring valuables to the hospital. [place] IS NOT             RESPONSIBLE   FOR VALUABLES.   Contacts, glasses, or bridgework may not be worn into surgery.   Bring small overnight bag day of surgery.   DO NOT  BRING YOUR HOME MEDICATIONS TO THE HOSPITAL. PHARMACY WILL DISPENSE MEDICATIONS LISTED ON YOUR MEDICATION LIST TO YOU DURING YOUR ADMISSION IN THE HOSPITAL!    Patients discharged on the day of surgery will not be allowed to drive home.  Someone NEEDS to stay with you for the first 24 hours after anesthesia.   Special Instructions: Bring a copy of your healthcare power of attorney and living will documents         the day of surgery if you haven't scanned them before.              Please read over the following fact sheets you were given: IF YOU HAVE QUESTIONS ABOUT YOUR PRE-OP INSTRUCTIONS PLEASE CALL [phone]    [facility] - Preparing for Surgery Before surgery, you can play an important role.  Because skin is not sterile, your skin needs to be as free of germs as possible.  You can  reduce the number of germs on your skin by washing with CHG (chlorahexidine gluconate) soap before surgery.  CHG is an antiseptic cleaner which kills germs and bonds with the skin to continue killing germs even after washing. Please DO NOT use if you have an allergy to CHG or antibacterial soaps.  If your skin becomes reddened/irritated stop using the CHG and inform your nurse when you arrive at Short Stay. Do not shave (including legs and underarms) for at least 48 hours prior to the first CHG shower.  You may shave your face/neck. Please follow these instructions carefully:  1.  Shower with CHG Soap the night before surgery and the  morning of Surgery.  2.  If you choose to wash your hair, wash your hair first as usual with your  normal  shampoo.  3.  After you shampoo, rinse your hair and body thoroughly to remove the  shampoo.                           4.  Use CHG as you would any other liquid soap.  You can apply chg directly  to the skin and wash                       Gently with a scrungie or clean washcloth.  5.  Apply the CHG Soap to your body ONLY FROM THE NECK DOWN.   Do not use on face/ open                            Wound or open sores. Avoid contact with eyes, ears mouth and genitals (private parts).                       Wash face,  Genitals (private parts) with your normal soap.             6.  Wash thoroughly, paying special attention to the area where your surgery  will be performed.  7.  Thoroughly rinse your body with warm water from the neck down.  8.  DO NOT shower/wash with your normal soap after using and rinsing off  the CHG Soap.                9.  Pat yourself dry with a clean towel.            10.  Wear clean pajamas.            11.  Place clean sheets on your bed the night of your first shower and do not  sleep with pets. Day of Surgery : Do not apply any lotions/deodorants the morning of surgery.  Please wear clean clothes to the hospital/surgery center.  FAILURE TO FOLLOW THESE INSTRUCTIONS MAY RESULT IN THE CANCELLATION OF YOUR SURGERY PATIENT SIGNATURE_________________________________  NURSE SIGNATURE__________________________________  ________________________________________________________________________

## 2022-12-14 ENCOUNTER — Encounter (HOSPITAL_COMMUNITY): Payer: Self-pay

## 2022-12-14 ENCOUNTER — Other Ambulatory Visit: Payer: Self-pay

## 2022-12-14 ENCOUNTER — Encounter (HOSPITAL_COMMUNITY)
Admission: RE | Admit: 2022-12-14 | Discharge: 2022-12-14 | Disposition: A | Discharge status: Home / Self Care | Payer: Medicare Other | Source: Ambulatory Visit | Attending: Interventional Radiology | Admitting: Interventional Radiology

## 2022-12-14 VITALS — BP 142/71 | HR 49 | Temp 97.6°F | Ht 70.0 in | Wt 211.0 lb

## 2022-12-14 DIAGNOSIS — E119 Type 2 diabetes mellitus without complications: Secondary | ICD-10-CM | POA: Insufficient documentation

## 2022-12-14 DIAGNOSIS — Z01818 Encounter for other preprocedural examination: Secondary | ICD-10-CM | POA: Diagnosis present

## 2022-12-14 DIAGNOSIS — I10 Essential (primary) hypertension: Secondary | ICD-10-CM | POA: Diagnosis not present

## 2022-12-14 LAB — BASIC METABOLIC PANEL
Anion gap: 8 (ref 5–15)
BUN: 32 mg/dL — ABNORMAL HIGH (ref 8–23)
CO2: 22 mmol/L (ref 22–32)
Calcium: 8.8 mg/dL — ABNORMAL LOW (ref 8.9–10.3)
Chloride: 109 mmol/L (ref 98–111)
Creatinine, Ser: 1.26 mg/dL — ABNORMAL HIGH (ref 0.61–1.24)
GFR, Estimated: 56 mL/min — ABNORMAL LOW (ref >60)
Glucose, Bld: 149 mg/dL — ABNORMAL HIGH (ref 70–99)
Potassium: 4 mmol/L (ref 3.5–5.1)
Sodium: 139 mmol/L (ref 135–145)

## 2022-12-14 LAB — CBC
HCT: 36 % — ABNORMAL LOW (ref 39.0–52.0)
Hemoglobin: 12.2 g/dL — ABNORMAL LOW (ref 13.0–17.0)
MCH: 33.7 pg (ref 26.0–34.0)
MCHC: 33.9 g/dL (ref 30.0–36.0)
MCV: 99.4 fL (ref 80.0–100.0)
Platelets: 193 10*3/uL (ref 150–400)
RBC: 3.62 MIL/uL — ABNORMAL LOW (ref 4.22–5.81)
RDW: 13.1 % (ref 11.5–15.5)
WBC: 7.1 10*3/uL (ref 4.0–10.5)
nRBC: 0 % (ref 0.0–0.2)

## 2022-12-14 LAB — TYPE AND SCREEN
ABO/RH(D): O POS
Antibody Screen: NEGATIVE

## 2022-12-14 LAB — HEMOGLOBIN A1C
Hgb A1c MFr Bld: 5.9 % — ABNORMAL HIGH (ref 4.8–5.6)
Mean Plasma Glucose: 122.63 mg/dL

## 2022-12-14 LAB — GLUCOSE, CAPILLARY: Glucose-Capillary: 161 mg/dL — ABNORMAL HIGH (ref 70–99)

## 2022-12-14 NOTE — Progress Notes (Signed)
For Short Stay: COVID SWAB appointment date:  Bowel Prep reminder:   For Anesthesia: PCP - Dr. Georgiann Mccoy Cardiologist - Dr. Daryel November: Octavio Manns. CT Chest: 07/13/22 Chest x-ray - 01/13/22 EKG - 12/14/22 Stress Test -  ECHO -  Cardiac Cath -  Pacemaker/ICD device last checked: Pacemaker orders received: Device Rep notified:  Spinal Cord Stimulator:  Sleep Study - Yes CPAP - NO  Fasting Blood Sugar - N/A Checks Blood Sugar __0___ times a day Date and result of last Hgb A1c-  Last dose of GLP1 agonist- N/A GLP1 instructions:   Last dose of SGLT-2 inhibitors- N/A SGLT-2 instructions:   Blood Thinner Instructions: Eliquis: last dose: 12/17/22 Aspirin Instructions: Last Dose:  Activity level: Can go up a flight of stairs and activities of daily living without stopping and without chest pain and/or shortness of breath   Able to exercise without chest pain and/or shortness of breath  Anesthesia review: Hx: DIA,HTN,CKD III,Murmur,Factor V  Patient denies shortness of breath, fever, cough and chest pain at PAT appointment   Patient verbalized understanding of instructions that were given to them at the PAT appointment. Patient was also instructed that they will need to review over the PAT instructions again at home before surgery.

## 2022-12-20 ENCOUNTER — Other Ambulatory Visit (INDEPENDENT_AMBULATORY_CARE_PROVIDER_SITE_OTHER): Payer: Self-pay | Admitting: Gastroenterology

## 2022-12-20 ENCOUNTER — Telehealth (INDEPENDENT_AMBULATORY_CARE_PROVIDER_SITE_OTHER): Payer: Self-pay

## 2022-12-20 MED ORDER — CLENPIQ 10-3.5-12 MG-GM -GM/160ML PO SOLN: 1.0000 | Freq: Once | ORAL | 0 refills | Status: AC

## 2022-12-20 NOTE — Telephone Encounter (Signed)
Patient wife Ander Slade, called this am says that Dr. [name] has a procedure to do an ablation on the patient's metastatic liver lesion. This is scheduled on 12/27/2022. Doctor Antony Odea had made a suggestion to the patient and his wife that due to the lesion being near the colon he thought a small bowel prep may be of benefit. When they called Dr. Antonietta Jewel office for this they told them to call our office to see what we would suggest. They use [facility] [address] [place], [place].  Also patient is due for his next TCS in June 2024 and the wife wants to know if he should push the tcs back a few weeks after this ablation. Please advise. Thanks

## 2022-12-20 NOTE — Telephone Encounter (Signed)
I sent Clenpiq to his pharmacy That should be fine, can postpone his repeat colonoscopy for July

## 2022-12-21 ENCOUNTER — Telehealth (INDEPENDENT_AMBULATORY_CARE_PROVIDER_SITE_OTHER): Payer: Self-pay | Admitting: Gastroenterology

## 2022-12-21 NOTE — Telephone Encounter (Signed)
noted 

## 2022-12-21 NOTE — Telephone Encounter (Signed)
    12/21/22  Herbert Wood 06/01/1935  What type of surgery is being performed? Colonoscopy   When is surgery scheduled? TBD  Clearance to hold Eliquis  Name of physician performing surgery?  Dr. Katrinka Blazing [facility] Phone: [phone] Fax: [phone]  Anethesia type (none, local, MAC, general)? MAC

## 2022-12-21 NOTE — Telephone Encounter (Signed)
Herbert Wood, not sure which of you need to know, but we need to postpone Tcs until July 2024. Patient wife made aware.  Patient wife Herbert Wood made aware prep sent in and that we can postpone Tcs until July 2024.

## 2022-12-21 NOTE — Telephone Encounter (Signed)
Medication clearance sent to Dr.K.  Contacted pt wife Ander Slade and asked if she wanted to go ahead and schedule TCS. Wife would like to get through surgery next week.  Will call pt back in June (around 2nd week of June)

## 2022-12-22 NOTE — Anesthesia Preprocedure Evaluation (Signed)
Anesthesia Evaluation  Patient identified by MRN, date of birth, ID band Patient awake    Reviewed: Allergy & Precautions, NPO status , Patient's Chart, lab work & pertinent test results  History of Anesthesia Complications Negative for: history of anesthetic complications  Airway Mallampati: III  TM Distance: >3 FB Neck ROM: Full    Dental no notable dental hx. (+) Dental Advisory Given   Pulmonary former smoker   Pulmonary exam normal        Cardiovascular hypertension, Pt. on medications + Peripheral Vascular Disease and + DVT  Normal cardiovascular exam  Echo 01/30/2022 (results on chart) Normal left ventricular systolic function LVEF 55 to 60%, mild aortic stenosis, mean aortic gradient 10.3 mmHg, valve area 1.9 cm.    Neuro/Psych negative neurological ROS  negative psych ROS   GI/Hepatic negative GI ROS, Neg liver ROS,,,  Endo/Other  diabetes    Renal/GU Renal disease  negative genitourinary   Musculoskeletal negative musculoskeletal ROS (+)    Abdominal   Peds negative pediatric ROS (+)  Hematology  (+) Blood dyscrasia, anemia Factor V deficiency   Anesthesia Other Findings   Reproductive/Obstetrics negative OB ROS                             Anesthesia Physical Anesthesia Plan  ASA: 3  Anesthesia Plan: General   Post-op Pain Management: Tylenol PO (pre-op)* and Minimal or no pain anticipated   Induction: Intravenous  PONV Risk Score and Plan: 2 and Ondansetron and Dexamethasone  Airway Management Planned: Oral ETT  Additional Equipment: None  Intra-op Plan:   Post-operative Plan: Extubation in OR  Informed Consent: I have reviewed the patients History and Physical, chart, labs and discussed the procedure including the risks, benefits and alternatives for the proposed anesthesia with the patient or authorized representative who has indicated his/her understanding  and acceptance.     Dental advisory given  Plan Discussed with: Anesthesiologist and CRNA  Anesthesia Plan Comments: (See PAT note 12/14/2022)        Anesthesia Quick Evaluation

## 2022-12-22 NOTE — Progress Notes (Signed)
Anesthesia Chart Review   Case: [id number] Date/Time: 12/27/22 0815   Procedure: CT microwave ablation of the liver   Anesthesia type: General   Pre-op diagnosis: right liver lesion   [facility]   Surgeons: Irish Lack, MD       DISCUSSION:88 y.o. 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 Angelena Form on 02/17/22 , CKD Stage III, right liver lesion scheduled for above procedure 12/27/2022 with Dr. Irish Lack.   Eliquis managed by Dr. Ellin Saba.  Per 12/01/2022 note patient can hold Eliquis 2 days prior to liver ablation.  Stress test 01/26/2022 normal study no evidence of infarct.  (results on chart)  Echo 01/30/2022 (results on chart) Normal left ventricular systolic function LVEF 55 to 60%, mild aortic stenosis, mean aortic gradient 10.3 mmHg, valve area 1.9 cm.  VS: BP (!) 142/71   Pulse (!) 49   Temp 36.4 C (Oral)   Ht 5\' 10"  (1.778 m)   Wt 95.7 kg   SpO2 98%   BMI 30.28 kg/m   PROVIDERS: Pomposini, Rande Brunt, MD  Daryel November, MD is Cardiologist  LABS: Labs reviewed: Acceptable for surgery. (all labs ordered are listed, but only abnormal results are displayed)  Labs Reviewed  HEMOGLOBIN A1C - Abnormal; Notable for the following components:      Result Value   Hgb A1c MFr Bld 5.9 (*)    All other components within normal limits  BASIC METABOLIC PANEL - Abnormal; Notable for the following components:   Glucose, Bld 149 (*)    BUN 32 (*)    Creatinine, Ser 1.26 (*)    Calcium 8.8 (*)    GFR, Estimated 56 (*)    All other components within normal limits  CBC - Abnormal; Notable for the following components:   RBC 3.62 (*)    Hemoglobin 12.2 (*)    HCT 36.0 (*)    All other components within normal limits  GLUCOSE, CAPILLARY - Abnormal; Notable for the following components:   Glucose-Capillary 161 (*)    All other components within normal limits  TYPE AND SCREEN      IMAGES:   EKG:   CV:  Past Medical History:  Diagnosis Date   Adenomatous colon polyp    Anemia    Carotid bruit    Chronic dyspnea    Diabetes (HCC)    type 2   Diverticular disease    DVT (deep venous thrombosis) (HCC)    lower limb   Dyspnea    Factor V deficiency (HCC)    Hearing loss    bilateral   Hyperlipemia    Hypertension    Inguinal hernia    Kidney disease    stage 3   Lung nodule    Lymphadenopathy    Malignant neoplasm (HCC)    salivary gland   Murmur    Osteoarthritis    Senile purpura (HCC)     Past Surgical History:  Procedure Laterality Date   BIOPSY  01/10/2022   Procedure: BIOPSY;  Surgeon: Dolores Frame, MD;  [facility];  Service: Gastroenterology;;   COLONOSCOPY WITH PROPOFOL N/A 01/10/2022   Procedure: COLONOSCOPY WITH PROPOFOL;  Surgeon: Dolores Frame, MD;  [facility];  Service: Gastroenterology;  Laterality: N/A;  per Soledad Gerlach, pt to arrive at 9:15   ESOPHAGOGASTRODUODENOSCOPY (EGD) WITH PROPOFOL N/A 01/10/2022   Procedure: ESOPHAGOGASTRODUODENOSCOPY (EGD)  WITH PROPOFOL;  Surgeon: Marguerita Merles, Reuel Boom, MD;  [facility];  Service: Gastroenterology;  Laterality: N/A;   HAND / FINGER LESION EXCISION     HERNIA REPAIR     MASTOIDECTOMY     PILONIDAL CYST EXCISION     POLYPECTOMY  01/10/2022   Procedure: POLYPECTOMY;  Surgeon: Marguerita Merles, Reuel Boom, MD;  [facility];  Service: Gastroenterology;;   SUBMUCOSAL TATTOO INJECTION  01/10/2022   Procedure: SUBMUCOSAL TATTOO INJECTION;  Surgeon: Marguerita Merles, Reuel Boom, MD;  [facility];  Service: Gastroenterology;;   SURGERY OF LIP     skin cancer    MEDICATIONS:  amitriptyline (ELAVIL) 10 MG tablet   amLODipine (NORVASC) 5 MG tablet   ascorbic acid (VITAMIN C/NATURAL ROSE HIPS) 1000 MG tablet   atorvastatin (LIPITOR) 40 MG tablet   carvedilol (COREG) 6.25 MG tablet   Cholecalciferol  (VITAMIN D3) 125 MCG (5000 UT) CAPS   dorzolamide (TRUSOPT) 2 % ophthalmic solution   ELIQUIS 5 MG TABS tablet   ferrous sulfate 325 (65 FE) MG tablet   folic acid (FOLVITE) 800 MCG tablet   hydrALAZINE (APRESOLINE) 25 MG tablet   irbesartan (AVAPRO) 300 MG tablet   KERENDIA 10 MG TABS   latanoprost (XALATAN) 0.005 % ophthalmic solution   linagliptin (TRADJENTA) 5 MG TABS tablet   loratadine (CLARITIN) 10 MG tablet   Misc Natural Products (PROSTATE SUPPORT PO)   omeprazole (PRILOSEC) 40 MG capsule   predniSONE (DELTASONE) 50 MG tablet   vitamin B-12 (CYANOCOBALAMIN) 1000 MCG tablet   No current facility-administered medications for this encounter.   Jodell Cipro Ward, PA-C [facility] [phone]

## 2022-12-26 ENCOUNTER — Encounter (INDEPENDENT_AMBULATORY_CARE_PROVIDER_SITE_OTHER): Payer: Self-pay

## 2022-12-26 ENCOUNTER — Other Ambulatory Visit: Payer: Self-pay | Admitting: Radiology

## 2022-12-26 ENCOUNTER — Telehealth (INDEPENDENT_AMBULATORY_CARE_PROVIDER_SITE_OTHER): Payer: Self-pay

## 2022-12-26 DIAGNOSIS — C787 Secondary malignant neoplasm of liver and intrahepatic bile duct: Secondary | ICD-10-CM

## 2022-12-26 NOTE — Telephone Encounter (Signed)
Per Leeroy Bock make sure the patient does a liquid diet for the rest of the day today, then start the prep today at 4pm by drinking one bottle of the premixed Clenpiq right from the bottle followed by (5) eight oz drinks of choice with in the next 5 hours, then drink the second bottle of the pre mixed Clenpiq right from the bottle, followed by (3) eight oz drinks with in the next three hours before going to be. (Instructions per our protocol for Clenpiq). Patient wife made aware, states understanding.

## 2022-12-26 NOTE — Telephone Encounter (Signed)
Patient wife called today says she picked up clenpiq from the pharmacy today as the patient is having a liver lesion ablation on 12/27/2022 with Dr. [name]. Per patient wife Dr. [name] wanted a "small" bowel prep done prior to the procedure,but when they called his office they were told to call the Gi office to send this in. Dr. [name] sent this in and the instructions were take one each by mouth once for one dose. The wife is wanting to know how to administer this as she feels the directions should not be taken as per a colonoscopy prep. She wants to know if he can just use one bottle today and not the second one. Please advise.

## 2022-12-26 NOTE — H&P (Signed)
Referring Physician(s): Katragadda,S  Supervising Physician: Irish Lack  Patient Status:  [facility] TBA  Chief Complaint:  Metastatic colon cancer to liver  Subjective: Patient known to IR service from consultation with Dr. [name] on 11/29/2022 to discuss treatment options for a solitary 3 cm hypermetabolic right lobe liver mass consistent with metastatic colon carcinoma.  Patient was deemed an appropriate candidate for CT-guided thermal ablation of the liver mass and presents today for the procedure.  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      Past Medical History:  Diagnosis Date   Adenomatous colon polyp    Anemia    Carotid bruit    Chronic dyspnea    Diabetes (HCC)    type 2   Diverticular disease    DVT (deep venous thrombosis) (HCC)    lower limb   Dyspnea    Factor V deficiency (HCC)    Hearing loss    bilateral   Hyperlipemia    Hypertension    Inguinal hernia    Kidney disease    stage 3   Lung nodule    Lymphadenopathy    Malignant neoplasm (HCC)    salivary gland   Murmur    Osteoarthritis    Senile purpura (HCC)    Past Surgical History:  Procedure Laterality Date   BIOPSY  01/10/2022   Procedure: BIOPSY;  Surgeon: Dolores Frame, MD;  [facility];  Service: Gastroenterology;;   COLONOSCOPY WITH PROPOFOL N/A 01/10/2022   Procedure: COLONOSCOPY WITH PROPOFOL;  Surgeon: Dolores Frame, MD;  [facility];  Service: Gastroenterology;  Laterality: N/A;  per Soledad Gerlach, pt to arrive at 9:15   ESOPHAGOGASTRODUODENOSCOPY (EGD) WITH PROPOFOL N/A 01/10/2022   Procedure: ESOPHAGOGASTRODUODENOSCOPY (EGD) WITH PROPOFOL;   Surgeon: Dolores Frame, MD;  [facility];  Service: Gastroenterology;  Laterality: N/A;   HAND / FINGER LESION EXCISION     HERNIA REPAIR     MASTOIDECTOMY     PILONIDAL CYST EXCISION     POLYPECTOMY  01/10/2022   Procedure: POLYPECTOMY;  Surgeon: Dolores Frame, MD;  [facility];  Service: Gastroenterology;;   SUBMUCOSAL TATTOO INJECTION  01/10/2022   Procedure: SUBMUCOSAL TATTOO INJECTION;  Surgeon: Dolores Frame, MD;  [facility];  Service: Gastroenterology;;   SURGERY OF LIP     skin cancer      Allergies: Gadobenate  Medications: Prior to Admission medications   Medication Sig Start Date End Date Taking? Authorizing Provider  amitriptyline (ELAVIL) 10 MG tablet Take 10 mg by mouth every evening.   Yes [provider]  amLODipine (NORVASC) 5 MG tablet Take 5 mg by mouth 2 (two) times daily.   Yes [provider]  ascorbic acid (VITAMIN C/NATURAL ROSE HIPS) 1000 MG tablet Take 1,000 mg by mouth every evening.   Yes [provider]  atorvastatin (LIPITOR) 40 MG tablet Take 40 mg by mouth every evening.   Yes [provider]  carvedilol (COREG) 6.25 MG tablet Take 6.25 mg by mouth 2 (two) times daily with a meal.   Yes [provider]  Cholecalciferol (VITAMIN D3) 125 MCG (5000 UT) CAPS Take 5,000 Units by mouth every evening.   Yes [provider]  dorzolamide (TRUSOPT) 2 % ophthalmic solution Place 1 drop into the left eye 2 (two) times daily.   Yes [provider]  ELIQUIS 5 MG TABS tablet TAKE 1 TABLET(5 MG) BY MOUTH TWICE DAILY 10/05/22  Yes [name], MD  ferrous sulfate 325 (65 FE) MG tablet Take 325 mg by mouth in the morning and at bedtime. Iron Nature's Bounty   Yes [provider]  folic acid (FOLVITE) 800 MCG tablet Take 800 mcg by mouth every evening. With B12 Now (Brand)   Yes [provider]  hydrALAZINE  (APRESOLINE) 25 MG tablet Take 50 mg by mouth in the morning and at bedtime.   Yes [provider]  irbesartan (AVAPRO) 300 MG tablet Take 300 mg by mouth every evening. 10/03/21  Yes [provider]  KERENDIA 10 MG TABS Take 10 mg by mouth daily.   Yes [provider]  latanoprost (XALATAN) 0.005 % ophthalmic solution Place 1 drop into both eyes at bedtime.   Yes [provider]  linagliptin (TRADJENTA) 5 MG TABS tablet Take 5 mg by mouth in the morning.   Yes [provider]  loratadine (CLARITIN) 10 MG tablet Take 10 mg by mouth every morning.   Yes [provider]  Misc Natural Products (PROSTATE SUPPORT PO) Take 1 capsule by mouth every evening. NOW Prostate Support   Yes [provider]  omeprazole (PRILOSEC) 40 MG capsule Take 1 capsule (40 mg total) by mouth daily. 01/10/22  Yes Dolores Frame, MD  vitamin B-12 (CYANOCOBALAMIN) 1000 MCG tablet Take 1,000 mcg by mouth daily. Now (Brand)   Yes [provider]  predniSONE (DELTASONE) 50 MG tablet Take 1 tablet 13 hours prior to CT Take 1 tablet   7 hours prior to CT Take 1 tablet   1 hour prior to CT Take 50 mg of Benadryl 1 hour prior to CT 11/21/22   [name], MD     Vital Signs:   Physical Exam  Imaging: No results found.  Labs:  CBC: Recent Labs    03/16/22 1003 07/13/22 1158 10/19/22 1402 12/14/22 1402  WBC 6.0 7.1 6.1 7.1  HGB 9.9* 11.8* 12.0* 12.2*  HCT 31.2* 35.3* 35.3* 36.0*  PLT 187 178 188 193    COAGS: No results for input(s): "INR", "APTT" in the last 8760 hours.  BMP: Recent Labs    03/16/22 1003 07/13/22 1158 10/19/22 1402 12/14/22 1402  NA 141 137 136 139  K 4.1 4.5 4.1 4.0  CL 111 105 106 109  CO2 25 25 21* 22  GLUCOSE 126* 115* 137* 149*  BUN 37* 34* 38* 32*  CALCIUM 9.1 9.0 8.7* 8.8*  CREATININE 1.38* 1.26* 1.34* 1.26*  GFRNONAA 50* 56* 52* 56*    LIVER FUNCTION TESTS: Recent Labs     02/17/22 0940 03/16/22 1003 07/13/22 1158 10/19/22 1402  BILITOT 1.0 0.7 1.1 1.3*  AST 17 27 26 28   ALT 18 34 33 36  ALKPHOS 79 87 102 114  PROT 6.4* 6.8 6.9 6.9  ALBUMIN 3.6 3.6 3.6 3.6    Assessment/plan:  88 year old male with past medical history significant for stage II descending colon carcinoma with prior resection on 02/17/2022 , anemia, carotid bruit, diabetes, diverticular disease, left lower extremity DVT, factor V Lieden on eliquis, hearing loss, hyperlipidemia, hypertension, chronic kidney disease, osteoarthritis ; now with solitary 3 cm hypermetabolic right lobe liver mass consistent with metastatic colon carcinoma.  Patient seen in consultation by Dr.  Yamagata on 11/29/2022 and deemed an appropriate candidate for CT-guided thermal ablation of the right liver mass.  He is scheduled today for the procedure.Risks and benefits of procedure was discussed with the patient and/or patient's family including,including but not limited to bleeding, infection, damage to adjacent structures, anesthesia related complications. All of the questions were answered and there is agreement to proceed.  Consent signed and in chart.   This procedure involves the use of CT and because of the nature of the planned procedure, it is possible that we will have prolonged use of CT.  Potential radiation risks to you include (but are not limited to) the following: - A slightly elevated risk for cancer  several years later in life. This risk is typically less than 0.5% percent. This risk is low in comparison to the normal incidence of human cancer, which is 33% for women and 50% for men according to the American Cancer Society. - Radiation induced injury can include skin redness, resembling a rash, tissue breakdown / ulcers and hair loss (which can be temporary or permanent).   The likelihood of either of these occurring depends on the difficulty of the procedure and whether you are sensitive to radiation  due to previous procedures, disease, or genetic conditions.   IF your procedure requires a prolonged use of radiation, you will be notified and given written instructions for further action.  It is your responsibility to monitor the irradiated area for the 2 weeks following the procedure and to notify your physician if you are concerned that you have suffered a radiation induced injury.         Electronically Signed: D. Jeananne Rama, PA-C 12/26/2022, 2:52 PM   I spent a total of 30 minutes at the the patient's bedside AND on the patient's hospital floor or unit, greater than 50% of which was counseling/coordinating care for CT-guided thermal ablation of right lobe liver mass

## 2022-12-27 ENCOUNTER — Observation Stay (HOSPITAL_COMMUNITY)
Admission: RE | Admit: 2022-12-27 | Discharge: 2022-12-27 | Disposition: A | Discharge status: Home / Self Care | Payer: Medicare Other | Source: Ambulatory Visit | Attending: Interventional Radiology | Admitting: Interventional Radiology

## 2022-12-27 ENCOUNTER — Ambulatory Visit (HOSPITAL_COMMUNITY): Payer: Medicare Other

## 2022-12-27 ENCOUNTER — Ambulatory Visit (HOSPITAL_BASED_OUTPATIENT_CLINIC_OR_DEPARTMENT_OTHER): Payer: Medicare Other | Admitting: Anesthesiology

## 2022-12-27 ENCOUNTER — Observation Stay (HOSPITAL_COMMUNITY)
Admission: RE | Admit: 2022-12-27 | Discharge: 2022-12-28 | Disposition: A | Discharge status: Home / Self Care | Payer: Medicare Other | Attending: Interventional Radiology | Admitting: Interventional Radiology

## 2022-12-27 ENCOUNTER — Ambulatory Visit (HOSPITAL_COMMUNITY): Payer: Medicare Other | Admitting: Physician Assistant

## 2022-12-27 ENCOUNTER — Encounter (HOSPITAL_COMMUNITY): Payer: Self-pay | Admitting: Interventional Radiology

## 2022-12-27 ENCOUNTER — Other Ambulatory Visit: Payer: Self-pay

## 2022-12-27 ENCOUNTER — Encounter (HOSPITAL_COMMUNITY)
Admission: RE | Disposition: A | Discharge status: Home / Self Care | Payer: Self-pay | Source: Home / Self Care | Attending: Interventional Radiology

## 2022-12-27 DIAGNOSIS — I129 Hypertensive chronic kidney disease with stage 1 through stage 4 chronic kidney disease, or unspecified chronic kidney disease: Secondary | ICD-10-CM | POA: Insufficient documentation

## 2022-12-27 DIAGNOSIS — E785 Hyperlipidemia, unspecified: Secondary | ICD-10-CM | POA: Diagnosis not present

## 2022-12-27 DIAGNOSIS — D682 Hereditary deficiency of other clotting factors: Secondary | ICD-10-CM | POA: Diagnosis not present

## 2022-12-27 DIAGNOSIS — N183 Chronic kidney disease, stage 3 unspecified: Secondary | ICD-10-CM | POA: Diagnosis not present

## 2022-12-27 DIAGNOSIS — I10 Essential (primary) hypertension: Secondary | ICD-10-CM | POA: Diagnosis not present

## 2022-12-27 DIAGNOSIS — D630 Anemia in neoplastic disease: Secondary | ICD-10-CM | POA: Diagnosis not present

## 2022-12-27 DIAGNOSIS — D631 Anemia in chronic kidney disease: Secondary | ICD-10-CM | POA: Diagnosis not present

## 2022-12-27 DIAGNOSIS — C189 Malignant neoplasm of colon, unspecified: Secondary | ICD-10-CM

## 2022-12-27 DIAGNOSIS — Z86718 Personal history of other venous thrombosis and embolism: Secondary | ICD-10-CM | POA: Diagnosis not present

## 2022-12-27 DIAGNOSIS — Z7902 Long term (current) use of antithrombotics/antiplatelets: Secondary | ICD-10-CM | POA: Insufficient documentation

## 2022-12-27 DIAGNOSIS — E1122 Type 2 diabetes mellitus with diabetic chronic kidney disease: Secondary | ICD-10-CM | POA: Diagnosis not present

## 2022-12-27 DIAGNOSIS — C787 Secondary malignant neoplasm of liver and intrahepatic bile duct: Secondary | ICD-10-CM | POA: Diagnosis not present

## 2022-12-27 DIAGNOSIS — K769 Liver disease, unspecified: Secondary | ICD-10-CM

## 2022-12-27 DIAGNOSIS — Z79899 Other long term (current) drug therapy: Secondary | ICD-10-CM | POA: Insufficient documentation

## 2022-12-27 HISTORY — PX: RADIOLOGY WITH ANESTHESIA: SHX6223

## 2022-12-27 LAB — CBC WITH DIFFERENTIAL/PLATELET
Abs Immature Granulocytes: 0.13 10*3/uL — ABNORMAL HIGH (ref 0.00–0.07)
Basophils Absolute: 0 10*3/uL (ref 0.0–0.1)
Basophils Relative: 0 %
Eosinophils Absolute: 0.1 10*3/uL (ref 0.0–0.5)
Eosinophils Relative: 1 %
HCT: 35 % — ABNORMAL LOW (ref 39.0–52.0)
Hemoglobin: 11.9 g/dL — ABNORMAL LOW (ref 13.0–17.0)
Immature Granulocytes: 1 %
Lymphocytes Relative: 8 %
Lymphs Abs: 0.7 10*3/uL (ref 0.7–4.0)
MCH: 32.8 pg (ref 26.0–34.0)
MCHC: 34 g/dL (ref 30.0–36.0)
MCV: 96.4 fL (ref 80.0–100.0)
Monocytes Absolute: 0.9 10*3/uL (ref 0.1–1.0)
Monocytes Relative: 9 %
Neutro Abs: 7.6 10*3/uL (ref 1.7–7.7)
Neutrophils Relative %: 81 %
Platelets: 174 10*3/uL (ref 150–400)
RBC: 3.63 MIL/uL — ABNORMAL LOW (ref 4.22–5.81)
RDW: 12.7 % (ref 11.5–15.5)
WBC: 9.4 10*3/uL (ref 4.0–10.5)
nRBC: 0 % (ref 0.0–0.2)

## 2022-12-27 LAB — COMPREHENSIVE METABOLIC PANEL
ALT: 23 U/L (ref 0–44)
AST: 22 U/L (ref 15–41)
Albumin: 3.8 g/dL (ref 3.5–5.0)
Alkaline Phosphatase: 86 U/L (ref 38–126)
Anion gap: 11 (ref 5–15)
BUN: 33 mg/dL — ABNORMAL HIGH (ref 8–23)
CO2: 28 mmol/L (ref 22–32)
Calcium: 8.5 mg/dL — ABNORMAL LOW (ref 8.9–10.3)
Chloride: 101 mmol/L (ref 98–111)
Creatinine, Ser: 1.5 mg/dL — ABNORMAL HIGH (ref 0.61–1.24)
GFR, Estimated: 45 mL/min — ABNORMAL LOW (ref >60)
Glucose, Bld: 138 mg/dL — ABNORMAL HIGH (ref 70–99)
Potassium: 3.5 mmol/L (ref 3.5–5.1)
Sodium: 140 mmol/L (ref 135–145)
Total Bilirubin: 2.7 mg/dL — ABNORMAL HIGH (ref 0.3–1.2)
Total Protein: 6.9 g/dL (ref 6.5–8.1)

## 2022-12-27 LAB — GLUCOSE, CAPILLARY
Glucose-Capillary: 152 mg/dL — ABNORMAL HIGH (ref 70–99)
Glucose-Capillary: 146 mg/dL — ABNORMAL HIGH (ref 70–99)

## 2022-12-27 LAB — PROTIME-INR
INR: 1.1 (ref 0.8–1.2)
Prothrombin Time: 14.7 seconds (ref 11.4–15.2)

## 2022-12-27 SURGERY — CT WITH ANESTHESIA
Anesthesia: General

## 2022-12-27 MED ORDER — CARVEDILOL 6.25 MG PO TABS
6.2500 mg | ORAL_TABLET | Freq: Two times a day (BID) | ORAL | Status: DC
  Administered 2022-12-27 – 2022-12-28 (×2): 6.25 mg via ORAL
  Filled 2022-12-27 (×2): qty 1

## 2022-12-27 MED ORDER — IRBESARTAN 300 MG PO TABS
300.0000 mg | ORAL_TABLET | Freq: Every evening | ORAL | Status: DC
  Administered 2022-12-27: 300 mg via ORAL
  Filled 2022-12-27: qty 2
  Filled 2022-12-27: qty 1

## 2022-12-27 MED ORDER — VITAMIN D 25 MCG (1000 UNIT) PO TABS
5000.0000 [IU] | ORAL_TABLET | Freq: Every evening | ORAL | Status: DC
  Administered 2022-12-27: 5000 [IU] via ORAL
  Filled 2022-12-27: qty 5

## 2022-12-27 MED ORDER — FENTANYL CITRATE (PF) 100 MCG/2ML IJ SOLN
INTRAMUSCULAR | Status: DC | PRN
  Administered 2022-12-27: 50 ug via INTRAVENOUS
  Administered 2022-12-27: 100 ug via INTRAVENOUS

## 2022-12-27 MED ORDER — ACETAMINOPHEN 500 MG PO TABS
1000.0000 mg | ORAL_TABLET | Freq: Once | ORAL | Status: AC
  Administered 2022-12-27: 1000 mg via ORAL
  Filled 2022-12-27: qty 2

## 2022-12-27 MED ORDER — ONDANSETRON HCL 4 MG/2ML IJ SOLN
INTRAMUSCULAR | Status: DC | PRN
  Administered 2022-12-27: 4 mg via INTRAVENOUS

## 2022-12-27 MED ORDER — DOCUSATE SODIUM 100 MG PO CAPS
100.0000 mg | ORAL_CAPSULE | Freq: Two times a day (BID) | ORAL | Status: DC
  Administered 2022-12-27 (×2): 100 mg via ORAL
  Filled 2022-12-27 (×2): qty 1

## 2022-12-27 MED ORDER — AMISULPRIDE (ANTIEMETIC) 5 MG/2ML IV SOLN: 10.0000 mg | Freq: Once | INTRAVENOUS | Status: DC | PRN

## 2022-12-27 MED ORDER — PROPOFOL 10 MG/ML IV BOLUS
INTRAVENOUS | Status: DC | PRN
  Administered 2022-12-27: 130 mg via INTRAVENOUS

## 2022-12-27 MED ORDER — FENTANYL CITRATE PF 50 MCG/ML IJ SOSY: 25.0000 ug | PREFILLED_SYRINGE | INTRAMUSCULAR | Status: DC | PRN

## 2022-12-27 MED ORDER — SODIUM CHLORIDE 0.9 % IV SOLN: INTRAVENOUS | Status: DC

## 2022-12-27 MED ORDER — LIDOCAINE 2% (20 MG/ML) 5 ML SYRINGE
INTRAMUSCULAR | Status: DC | PRN
  Administered 2022-12-27: 80 mg via INTRAVENOUS

## 2022-12-27 MED ORDER — LACTATED RINGERS IV SOLN: INTRAVENOUS | Status: DC

## 2022-12-27 MED ORDER — FINERENONE 10 MG PO TABS: 10.0000 mg | ORAL_TABLET | Freq: Every day | ORAL | Status: DC

## 2022-12-27 MED ORDER — VITAMIN B-12 1000 MCG PO TABS
1000.0000 ug | ORAL_TABLET | Freq: Every day | ORAL | Status: DC
  Administered 2022-12-27 – 2022-12-28 (×2): 1000 ug via ORAL
  Filled 2022-12-27 (×2): qty 1

## 2022-12-27 MED ORDER — SODIUM CHLORIDE 0.9 % IV SOLN
INTRAVENOUS | Status: AC
  Filled 2022-12-27: qty 250

## 2022-12-27 MED ORDER — LINAGLIPTIN 5 MG PO TABS
5.0000 mg | ORAL_TABLET | Freq: Every day | ORAL | Status: DC
  Administered 2022-12-28: 5 mg via ORAL
  Filled 2022-12-27: qty 1

## 2022-12-27 MED ORDER — DORZOLAMIDE HCL 2 % OP SOLN
1.0000 [drp] | Freq: Two times a day (BID) | OPHTHALMIC | Status: DC
  Administered 2022-12-27 – 2022-12-28 (×2): 1 [drp] via OPHTHALMIC
  Filled 2022-12-27: qty 10

## 2022-12-27 MED ORDER — PROMETHAZINE HCL 25 MG/ML IJ SOLN: 6.2500 mg | INTRAMUSCULAR | Status: DC | PRN

## 2022-12-27 MED ORDER — DEXAMETHASONE SODIUM PHOSPHATE 10 MG/ML IJ SOLN
INTRAMUSCULAR | Status: DC | PRN
  Administered 2022-12-27: 5 mg via INTRAVENOUS

## 2022-12-27 MED ORDER — HYDROCODONE-ACETAMINOPHEN 5-325 MG PO TABS: 1.0000 | ORAL_TABLET | ORAL | Status: DC | PRN

## 2022-12-27 MED ORDER — GLYCOPYRROLATE PF 0.2 MG/ML IJ SOSY
PREFILLED_SYRINGE | INTRAMUSCULAR | Status: DC | PRN
  Administered 2022-12-27: .2 mg via INTRAVENOUS

## 2022-12-27 MED ORDER — SUGAMMADEX SODIUM 200 MG/2ML IV SOLN
INTRAVENOUS | Status: DC | PRN
  Administered 2022-12-27: 200 mg via INTRAVENOUS

## 2022-12-27 MED ORDER — HYDRALAZINE HCL 50 MG PO TABS
50.0000 mg | ORAL_TABLET | Freq: Every day | ORAL | Status: DC
  Administered 2022-12-28: 50 mg via ORAL
  Filled 2022-12-27: qty 1

## 2022-12-27 MED ORDER — SENNOSIDES-DOCUSATE SODIUM 8.6-50 MG PO TABS: 1.0000 | ORAL_TABLET | Freq: Every day | ORAL | Status: DC | PRN

## 2022-12-27 MED ORDER — ROCURONIUM BROMIDE 10 MG/ML (PF) SYRINGE
PREFILLED_SYRINGE | INTRAVENOUS | Status: DC | PRN
  Administered 2022-12-27: 30 mg via INTRAVENOUS
  Administered 2022-12-27: 10 mg via INTRAVENOUS
  Administered 2022-12-27: 60 mg via INTRAVENOUS

## 2022-12-27 MED ORDER — AMLODIPINE BESYLATE 5 MG PO TABS
5.0000 mg | ORAL_TABLET | Freq: Two times a day (BID) | ORAL | Status: DC
  Administered 2022-12-27 – 2022-12-28 (×2): 5 mg via ORAL
  Filled 2022-12-27 (×2): qty 1

## 2022-12-27 MED ORDER — FENTANYL CITRATE (PF) 250 MCG/5ML IJ SOLN
INTRAMUSCULAR | Status: AC
  Filled 2022-12-27: qty 5

## 2022-12-27 MED ORDER — HYDROMORPHONE HCL 1 MG/ML IJ SOLN: 0.5000 mg | INTRAMUSCULAR | Status: DC | PRN

## 2022-12-27 MED ORDER — VITAMIN C 500 MG PO TABS
1000.0000 mg | ORAL_TABLET | Freq: Every evening | ORAL | Status: DC
  Administered 2022-12-27: 1000 mg via ORAL
  Filled 2022-12-27: qty 2

## 2022-12-27 MED ORDER — EPHEDRINE SULFATE-NACL 50-0.9 MG/10ML-% IV SOSY
PREFILLED_SYRINGE | INTRAVENOUS | Status: DC | PRN
  Administered 2022-12-27 (×2): 10 mg via INTRAVENOUS
  Administered 2022-12-27: 5 mg via INTRAVENOUS
  Administered 2022-12-27: 10 mg via INTRAVENOUS

## 2022-12-27 MED ORDER — PANTOPRAZOLE SODIUM 40 MG PO TBEC
40.0000 mg | DELAYED_RELEASE_TABLET | Freq: Every day | ORAL | Status: DC
  Administered 2022-12-28: 40 mg via ORAL
  Filled 2022-12-27: qty 1

## 2022-12-27 MED ORDER — PHENYLEPHRINE HCL-NACL 20-0.9 MG/250ML-% IV SOLN
INTRAVENOUS | Status: DC | PRN
  Administered 2022-12-27: 60 ug/min via INTRAVENOUS

## 2022-12-27 MED ORDER — ORAL CARE MOUTH RINSE: 15.0000 mL | Freq: Once | OROMUCOSAL | Status: AC

## 2022-12-27 MED ORDER — CHLORHEXIDINE GLUCONATE 0.12 % MT SOLN
15.0000 mL | Freq: Once | OROMUCOSAL | Status: AC
  Administered 2022-12-27: 15 mL via OROMUCOSAL

## 2022-12-27 MED ORDER — LATANOPROST 0.005 % OP SOLN
1.0000 [drp] | Freq: Every day | OPHTHALMIC | Status: DC
  Administered 2022-12-27: 1 [drp] via OPHTHALMIC
  Filled 2022-12-27: qty 2.5

## 2022-12-27 MED ORDER — FERROUS SULFATE 325 (65 FE) MG PO TABS
325.0000 mg | ORAL_TABLET | Freq: Every day | ORAL | Status: DC
  Administered 2022-12-28: 325 mg via ORAL
  Filled 2022-12-27: qty 1

## 2022-12-27 MED ORDER — ATORVASTATIN CALCIUM 40 MG PO TABS
40.0000 mg | ORAL_TABLET | Freq: Every evening | ORAL | Status: DC
  Administered 2022-12-27: 40 mg via ORAL
  Filled 2022-12-27: qty 1

## 2022-12-27 MED ORDER — SODIUM CHLORIDE 0.9 % IV SOLN
2.0000 g | Freq: Once | INTRAVENOUS | Status: AC
  Administered 2022-12-27: 2 g via INTRAVENOUS
  Filled 2022-12-27: qty 2

## 2022-12-27 MED ORDER — FOLIC ACID 1 MG PO TABS
1.0000 mg | ORAL_TABLET | Freq: Every evening | ORAL | Status: DC
  Administered 2022-12-27: 1 mg via ORAL
  Filled 2022-12-27: qty 1

## 2022-12-27 MED ORDER — AMITRIPTYLINE HCL 10 MG PO TABS
10.0000 mg | ORAL_TABLET | Freq: Every evening | ORAL | Status: DC
  Administered 2022-12-27: 10 mg via ORAL
  Filled 2022-12-27: qty 1

## 2022-12-27 MED ORDER — LORATADINE 10 MG PO TABS
10.0000 mg | ORAL_TABLET | Freq: Every morning | ORAL | Status: DC
  Administered 2022-12-28: 10 mg via ORAL
  Filled 2022-12-27: qty 1

## 2022-12-27 MED ORDER — ONDANSETRON HCL 4 MG/2ML IJ SOLN: 4.0000 mg | Freq: Four times a day (QID) | INTRAMUSCULAR | Status: DC | PRN

## 2022-12-27 NOTE — Anesthesia Procedure Notes (Signed)
Procedure Name: Intubation Date/Time: 12/27/2022 8:56 AM  Performed by: Theodosia Quay, CRNAPre-anesthesia Checklist: Patient identified, Emergency Drugs available, Suction available, Patient being monitored and Timeout performed Patient Re-evaluated:Patient Re-evaluated prior to induction Oxygen Delivery Method: Circle system utilized Preoxygenation: Pre-oxygenation with 100% oxygen Induction Type: IV induction Ventilation: Mask ventilation without difficulty Laryngoscope Size: Mac and 4 Grade View: Grade II Tube type: Oral Tube size: 7.0 mm Number of attempts: 1 Airway Equipment and Method: Stylet Placement Confirmation: ETT inserted through vocal cords under direct vision, positive ETCO2, CO2 detector and breath sounds checked- equal and bilateral Secured at: 22 cm Tube secured with: Tape Dental Injury: Teeth and Oropharynx as per pre-operative assessment

## 2022-12-27 NOTE — Plan of Care (Signed)

## 2022-12-27 NOTE — Procedures (Signed)
Interventional Radiology Procedure Note  Procedure: [place] and CT guided thermal ablation of liver metastasis  Complications: None  Estimated Blood Loss: < 10 mL  Findings: Roughly 2.4 x 3.5 cm metastatic lesion in inferior right lobe of liver treated with microwave ablation with single NeuWave XT probe.  Before ablation, hydrodissection performed between tip of liver and colon with 90 mL of dilute saline and contrast.  Ablation x 1 min at 95 W followed by 10 min at 65 W.   Plan: PACU recovery followed by overnight observation.  Jodi Marble. [name], M.D Pager:  [phone]

## 2022-12-27 NOTE — Progress Notes (Addendum)
Patient ID: Herbert Wood, male   DOB: 19-Apr-1935, 88 y.o.   MRN: [id number] Pt s/p thermal ablation liver lesion earlier today; currently without major c/o; still a little groggy from anesthesia, no sig abd pain,N/V; foley in place with yellow urine; spouse in room; puncture site RUQ NT; will plan for overnight obs, dc foley, SCD's, advance diet as tolerated; resume eliquis on 5/30; f/u with Dr. [name] in 3-4 weeks; f/u imaging in August

## 2022-12-27 NOTE — Transfer of Care (Signed)
Immediate Anesthesia Transfer of Care Note  Patient: Herbert Wood  Procedure(s) Performed: Procedure(s): CT microwave ablation of the liver (N/A)  Patient Location: PACU  Anesthesia Type:General  Level of Consciousness:  sedated, patient cooperative and responds to stimulation  Airway & Oxygen Therapy:Patient Spontanous Breathing and Patient connected to face mask oxgen  Post-op Assessment:  Report given to PACU RN and Post -op Vital signs reviewed and stable  Post vital signs:  Reviewed and stable  Last Vitals:  Vitals:   12/27/22 0659 12/27/22 1130  BP: (!) 152/59 (!) 114/50  Pulse:  (!) 55  Resp: 16 16  Temp: (!) 36.1 C   SpO2: 95% 100%    Complications: No apparent anesthesia complications

## 2022-12-27 NOTE — Anesthesia Postprocedure Evaluation (Signed)
Anesthesia Post Note  Patient: Herbert Wood  Procedure(s) Performed: CT microwave ablation of the liver     Patient location during evaluation: PACU Anesthesia Type: General Level of consciousness: sedated Pain management: pain level controlled Vital Signs Assessment: post-procedure vital signs reviewed and stable Respiratory status: spontaneous breathing and respiratory function stable Cardiovascular status: stable Postop Assessment: no apparent nausea or vomiting Anesthetic complications: no  No notable events documented.  Last Vitals:  Vitals:   12/27/22 1200 12/27/22 1215  BP: (!) 104/51 (!) 100/47  Pulse: (!) 58 (!) 58  Resp: 18 12  Temp:    SpO2: 91% 94%    Last Pain:  Vitals:   12/27/22 1215  TempSrc:   PainSc: 0-No pain                 Jones Viviani DANIEL

## 2022-12-27 NOTE — TOC CM/SW Note (Signed)
Transition of Care [facility]) - Inpatient Brief Assessment   Patient Details  Name: Herbert Wood MRN: [id number] Date of Birth: 1935/04/08  [facility] CM/SW Contact:    Durenda Guthrie, RN Phone Number: 12/27/2022, 2:57 PM   Clinical Narrative:    Transition of Care Asessment: Insurance and Status: Insurance coverage has been reviewed Patient has primary care physician: Yes (Dr. Georgiann Mccoy) Home environment has been reviewed: lives in single level home with wife Prior level of function:: independent prior to admission Prior/Current Home Services: No current home services Social Determinants of Health Reivew: SDOH reviewed no interventions necessary   Transition of care needs: no transition of care needs at this time

## 2022-12-27 NOTE — Sedation Documentation (Signed)
Anesthesia in to sedate and monitor. 

## 2022-12-28 ENCOUNTER — Encounter (HOSPITAL_COMMUNITY): Payer: Self-pay | Admitting: Interventional Radiology

## 2022-12-28 DIAGNOSIS — C189 Malignant neoplasm of colon, unspecified: Secondary | ICD-10-CM | POA: Diagnosis not present

## 2022-12-28 LAB — CBC
HCT: 32.3 % — ABNORMAL LOW (ref 39.0–52.0)
Hemoglobin: 10.8 g/dL — ABNORMAL LOW (ref 13.0–17.0)
MCH: 32.7 pg (ref 26.0–34.0)
MCHC: 33.4 g/dL (ref 30.0–36.0)
MCV: 97.9 fL (ref 80.0–100.0)
Platelets: 164 10*3/uL (ref 150–400)
RBC: 3.3 MIL/uL — ABNORMAL LOW (ref 4.22–5.81)
RDW: 12.8 % (ref 11.5–15.5)
WBC: 12.7 10*3/uL — ABNORMAL HIGH (ref 4.0–10.5)
nRBC: 0 % (ref 0.0–0.2)

## 2022-12-28 MED ORDER — APIXABAN 5 MG PO TABS
5.0000 mg | ORAL_TABLET | Freq: Two times a day (BID) | ORAL | Status: DC
  Administered 2022-12-28: 5 mg via ORAL
  Filled 2022-12-28: qty 1

## 2022-12-28 NOTE — Plan of Care (Signed)
  Problem: Education: Goal: Knowledge of General Education information will improve Description Including pain rating scale, medication(s)/side effects and non-pharmacologic comfort measures Outcome: Progressing   Problem: Health Behavior/Discharge Planning: Goal: Ability to manage health-related needs will improve Outcome: Progressing   

## 2022-12-28 NOTE — Discharge Summary (Signed)
Patient ID: Herbert Wood MRN: [id number] DOB/AGE: July 26, 1935 88 y.o.  Admit date: 12/27/2022 Discharge date: 12/28/2022  Supervising Physician: Herbert Wood  Patient Status: [facility] - In-pt  Admission Diagnoses: Metastatic colon cancer to the liver  Discharge Diagnoses: Metastatic colon cancer to liver, status post CT and ultrasound guided percutaneous microwave thermal ablation of metastatic lesion in the right lobe of the liver on 12/27/22 Principal Problem:   Colon carcinoma metastatic to liver [facility])  Past Medical History:  Diagnosis Date   Adenomatous colon polyp    Anemia    Carotid bruit    Chronic dyspnea    Diabetes (HCC)    type 2   Diverticular disease    DVT (deep venous thrombosis) (HCC)    lower limb   Dyspnea    Factor V deficiency (HCC)    Hearing loss    bilateral   Hyperlipemia    Hypertension    Inguinal hernia    Kidney disease    stage 3   Lung nodule    Lymphadenopathy    Malignant neoplasm (HCC)    salivary gland   Murmur    Osteoarthritis    Senile purpura (HCC)    Past Surgical History:  Procedure Laterality Date   BIOPSY  01/10/2022   Procedure: BIOPSY;  Surgeon: Herbert Frame, MD;  Location: [facility];  Service: Gastroenterology;;   COLONOSCOPY WITH PROPOFOL N/A 01/10/2022   Procedure: COLONOSCOPY WITH PROPOFOL;  Surgeon: Herbert Frame, MD;  Location: [facility];  Service: Gastroenterology;  Laterality: N/A;  per Herbert Wood, pt to arrive at 9:15   ESOPHAGOGASTRODUODENOSCOPY (EGD) WITH PROPOFOL N/A 01/10/2022   Procedure: ESOPHAGOGASTRODUODENOSCOPY (EGD) WITH PROPOFOL;  Surgeon: Herbert Frame, MD;  Location: [facility];  Service: Gastroenterology;  Laterality: N/A;   HAND / FINGER LESION EXCISION     HERNIA REPAIR     MASTOIDECTOMY     PILONIDAL CYST EXCISION     POLYPECTOMY  01/10/2022   Procedure: POLYPECTOMY;  Surgeon: Herbert Frame, MD;  Location: [facility];  Service:  Gastroenterology;;   RADIOLOGY WITH ANESTHESIA N/A 12/27/2022   Procedure: CT microwave ablation of the liver;  Surgeon: Herbert Lack, MD;  Location: [facility];  Service: Radiology;  Laterality: N/A;   SUBMUCOSAL TATTOO INJECTION  01/10/2022   Procedure: SUBMUCOSAL TATTOO INJECTION;  Surgeon: Herbert Frame, MD;  Location: [facility];  Service: Gastroenterology;;   SURGERY OF LIP     skin cancer     Discharged Condition: good  Hospital Course: Herbert Wood is an 88 year old male who was seen in consultation with Dr. Fredia Wood on 11/29/2022 to discuss treatment options for a solitary 3 cm hypermetabolic right lobe liver mass consistent with metastatic colon carcinoma.  Patient was deemed an appropriate candidate for CT-guided thermal ablation of the liver mass and he underwent the procedure at [facility] on 12/27/2022.  Procedure was performed via general anesthesia and without immediate complications.  The patient was subsequently monitored in the hospital overnight.  Overnight patient did well with no significant complaints.  On the day of discharge he was stable.  He was able to tolerate his diet, ambulate and void without significant difficulty.  He denied fever, headache, chest pain, dyspnea, cough, worsening abdominal pain, back pain, nausea, vomiting or bleeding.  Follow-up hemoglobin was 10.8 (11.9).  He was deemed stable for discharge at this time.  He will resume his home medications.  Dr. Fredia Wood will follow-up with  the patient in the IR clinic in 3 to 4 weeks.  Follow-up imaging will be obtained in August.  He will continue oncologic care with Dr. Ellin Wood as scheduled.  Patient was told to contact our service with any additional questions or concerns.  Consults: anesthesia  Significant Diagnostic Studies:  Results for orders placed or performed during the hospital encounter of 12/27/22  Glucose, capillary  Result Value Ref Range   Glucose-Capillary 152 (H) 70 - 99  mg/dL   Comment 1 Notify RN   CBC with Differential/Platelet  Result Value Ref Range   WBC 9.4 4.0 - 10.5 K/uL   RBC 3.63 (L) 4.22 - 5.81 MIL/uL   Hemoglobin 11.9 (L) 13.0 - 17.0 g/dL   HCT 16.1 (L) 09.6 - 04.5 %   MCV 96.4 80.0 - 100.0 fL   MCH 32.8 26.0 - 34.0 pg   MCHC 34.0 30.0 - 36.0 g/dL   RDW 40.9 81.1 - 91.4 %   Platelets 174 150 - 400 K/uL   nRBC 0.0 0.0 - 0.2 %   Neutrophils Relative % 81 %   Neutro Abs 7.6 1.7 - 7.7 K/uL   Lymphocytes Relative 8 %   Lymphs Abs 0.7 0.7 - 4.0 K/uL   Monocytes Relative 9 %   Monocytes Absolute 0.9 0.1 - 1.0 K/uL   Eosinophils Relative 1 %   Eosinophils Absolute 0.1 0.0 - 0.5 K/uL   Basophils Relative 0 %   Basophils Absolute 0.0 0.0 - 0.1 K/uL   Immature Granulocytes 1 %   Abs Immature Granulocytes 0.13 (H) 0.00 - 0.07 K/uL  Protime-INR  Result Value Ref Range   Prothrombin Time 14.7 11.4 - 15.2 seconds   INR 1.1 0.8 - 1.2  Comprehensive metabolic panel  Result Value Ref Range   Sodium 140 135 - 145 mmol/L   Potassium 3.5 3.5 - 5.1 mmol/L   Chloride 101 98 - 111 mmol/L   CO2 28 22 - 32 mmol/L   Glucose, Bld 138 (H) 70 - 99 mg/dL   BUN 33 (H) 8 - 23 mg/dL   Creatinine, Ser 7.82 (H) 0.61 - 1.24 mg/dL   Calcium 8.5 (L) 8.9 - 10.3 mg/dL   Total Protein 6.9 6.5 - 8.1 g/dL   Albumin 3.8 3.5 - 5.0 g/dL   AST 22 15 - 41 U/L   ALT 23 0 - 44 U/L   Alkaline Phosphatase 86 38 - 126 U/L   Total Bilirubin 2.7 (H) 0.3 - 1.2 mg/dL   GFR, Estimated 45 (L) >60 mL/min   Anion gap 11 5 - 15  Glucose, capillary  Result Value Ref Range   Glucose-Capillary 146 (H) 70 - 99 mg/dL  CBC  Result Value Ref Range   WBC 12.7 (H) 4.0 - 10.5 K/uL   RBC 3.30 (L) 4.22 - 5.81 MIL/uL   Hemoglobin 10.8 (L) 13.0 - 17.0 g/dL   HCT 95.6 (L) 21.3 - 08.6 %   MCV 97.9 80.0 - 100.0 fL   MCH 32.7 26.0 - 34.0 pg   MCHC 33.4 30.0 - 36.0 g/dL   RDW 57.8 46.9 - 62.9 %   Platelets 164 150 - 400 K/uL   nRBC 0.0 0.0 - 0.2 %     Treatments: CT/ultrasound-guided  thermal/microwave ablation of metastatic lesion from colon cancer in right lobe of liver on 12/27/22 via general anesthesia  Discharge Exam: Blood pressure (!) 107/55, pulse (!) 56, temperature 98.6 F (37 C), temperature source Oral, resp. rate 19, height 5'  10" (1.778 m), weight 220 lb (99.8 kg), SpO2 90 %. Awake, alert.  Chest clear to auscultation bilaterally.  Heart with slightly bradycardic rate, normal rhythm.  Abdomen soft, positive bowel sounds, puncture site right upper abdominal quadrant clean, dry, nontender, no hematoma.  No lower extremity edema.  Disposition: Discharge disposition: 01-Home or Self Care       Discharge Instructions     Call MD for:  difficulty breathing, headache or visual disturbances   Complete by: As directed    Call MD for:  extreme fatigue   Complete by: As directed    Call MD for:  hives   Complete by: As directed    Call MD for:  persistant dizziness or light-headedness   Complete by: As directed    Call MD for:  persistant nausea and vomiting   Complete by: As directed    Call MD for:  redness, tenderness, or signs of infection (pain, swelling, redness, odor or green/yellow discharge around incision site)   Complete by: As directed    Call MD for:  severe uncontrolled pain   Complete by: As directed    Call MD for:  temperature >100.4   Complete by: As directed    Change dressing (specify)   Complete by: As directed    May change bandage over right upper abdominal region daily for the next 2 to 3 days.  May wash site with soap and water.   Diet - low sodium heart healthy   Complete by: As directed    Discharge instructions   Complete by: As directed    May resume home medications, stay well-hydrated, avoid strenuous activity for the next 3 to 4 days   Driving Restrictions   Complete by: As directed    No driving for the next 24 hours   Increase activity slowly   Complete by: As directed    Lifting restrictions   Complete by: As  directed    No heavy lifting for the next 3 to 4 days   May shower / Bathe   Complete by: As directed    May walk up steps   Complete by: As directed       Allergies as of 12/28/2022       Reactions   Gadobenate Nausea And Vomiting   Immediately upon the infusion of 15mL multihance contrast  Patient had exteme nausea and vomiting.   No other symptoms noted .  No injury.  MRI scan was completed after patient felt better.   Immediately upon the infusion of 15mL multihance contrast  Patient had exteme nausea and vomiting.   No other symptoms noted .  No injury.  MRI scan was completed after patient felt better.          Medication List     STOP taking these medications    predniSONE 50 MG tablet Commonly known as: DELTASONE       TAKE these medications    amitriptyline 10 MG tablet Commonly known as: ELAVIL Take 10 mg by mouth every evening.   amLODipine 5 MG tablet Commonly known as: NORVASC Take 5 mg by mouth 2 (two) times daily.   atorvastatin 40 MG tablet Commonly known as: LIPITOR Take 40 mg by mouth every evening.   carvedilol 6.25 MG tablet Commonly known as: COREG Take 6.25 mg by mouth 2 (two) times daily with a meal.   cyanocobalamin 1000 MCG tablet Commonly known as: VITAMIN B12 Take 1,000 mcg by mouth daily. Now (  Brand)   dorzolamide 2 % ophthalmic solution Commonly known as: TRUSOPT Place 1 drop into the left eye 2 (two) times daily.   Eliquis 5 MG Tabs tablet Generic drug: apixaban TAKE 1 TABLET(5 MG) BY MOUTH TWICE DAILY   ferrous sulfate 325 (65 FE) MG tablet Take 325 mg by mouth in the morning and at bedtime. Iron Nature's Bounty   folic acid 800 MCG tablet Commonly known as: FOLVITE Take 800 mcg by mouth every evening. With B12 Now (Brand)   hydrALAZINE 25 MG tablet Commonly known as: APRESOLINE Take 50 mg by mouth in the morning and at bedtime.   irbesartan 300 MG tablet Commonly known as: AVAPRO Take 300 mg by mouth every  evening.   Kerendia 10 MG Tabs Generic drug: Finerenone Take 10 mg by mouth daily.   latanoprost 0.005 % ophthalmic solution Commonly known as: XALATAN Place 1 drop into both eyes at bedtime.   linagliptin 5 MG Tabs tablet Commonly known as: TRADJENTA Take 5 mg by mouth in the morning.   loratadine 10 MG tablet Commonly known as: CLARITIN Take 10 mg by mouth every morning.   omeprazole 40 MG capsule Commonly known as: PRILOSEC Take 1 capsule (40 mg total) by mouth daily.   PROSTATE SUPPORT PO Take 1 capsule by mouth every evening. NOW Prostate Support   Vitamin C/Natural Rose Hips 1000 MG tablet Generic drug: ascorbic acid Take 1,000 mg by mouth every evening.   Vitamin D3 125 MCG (5000 UT) Caps Take 5,000 Units by mouth every evening.               Discharge Care Instructions  (From admission, onward)           Start     Ordered   12/28/22 0000  Change dressing (specify)       Comments: May change bandage over right upper abdominal region daily for the next 2 to 3 days.  May wash site with soap and water.   12/28/22 1001            Follow-up Information     Herbert Lack, MD Follow up.   Specialties: Interventional Radiology, Radiology Why: Dr. Fredia Wood will contact you in 3 to 4 weeks to check status; call [phone] or [phone] with any questions Contact information: [address] [place] [address] [place] [address] [phone]         [name], MD Follow up.   Specialty: Hematology Why: Follow-up with Dr. Ellin Wood as scheduled Contact information: [address] [place] [address] [phone]                  Electronically Signed: D. Jeananne Rama, PA-C 12/28/2022, 10:05 AM   I have spent Less Than 30 Minutes discharging Herbert Wood.

## 2022-12-28 NOTE — Discharge Instructions (Signed)
May resume home medications, stay well-hydrated, avoid strenuous activity for the next 3 to 4 days 

## 2022-12-29 ENCOUNTER — Encounter (INDEPENDENT_AMBULATORY_CARE_PROVIDER_SITE_OTHER): Payer: Self-pay | Admitting: *Deleted

## 2023-01-03 MED ORDER — CLENPIQ 10-3.5-12 MG-GM -GM/175ML PO SOLN: 1.0000 | ORAL | 0 refills | Status: DC

## 2023-01-03 NOTE — Telephone Encounter (Signed)
Pt wife contacted and TCS scheduled for 02/27/23. Pt prefers Clenpiq. Sent to pharmacy. Instructions will be sent via mail once clearance received for Eliquis. Marland Kitchen

## 2023-01-03 NOTE — Addendum Note (Signed)
Addended by: Marlowe Shores on: 01/03/2023 11:57 AM   Modules accepted: Orders

## 2023-01-08 ENCOUNTER — Other Ambulatory Visit (INDEPENDENT_AMBULATORY_CARE_PROVIDER_SITE_OTHER): Payer: Self-pay | Admitting: Gastroenterology

## 2023-01-10 NOTE — Telephone Encounter (Signed)
Clearance resent to Dr.Katragadda

## 2023-01-12 ENCOUNTER — Ambulatory Visit
Admission: RE | Admit: 2023-01-12 | Discharge: 2023-01-12 | Disposition: A | Discharge status: Home / Self Care | Payer: Medicare Other | Source: Ambulatory Visit | Attending: Radiology | Admitting: Radiology

## 2023-01-12 DIAGNOSIS — C189 Malignant neoplasm of colon, unspecified: Secondary | ICD-10-CM

## 2023-01-12 NOTE — Telephone Encounter (Signed)
Resent clearance 

## 2023-01-12 NOTE — Progress Notes (Signed)
Chief Complaint: Patient was consulted remotely today (TeleHealth) for follow-up after microwave ablation of a colorectal liver metastasis on 12/27/2022.  History of Present Illness: Herbert Wood is a 88 y.o. 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  He underwent percutaneous microwave thermal ablation of the lesion on 12/27/2022.  At the time of the procedure the lesion measured approximately 2.5 x 3.4 cm by unenhanced CT.  The procedure was uncomplicated and the patient was observed overnight and sent home the next day.  He states that 2 days following the procedure, he developed some soreness in the region of treatment for approximately 3 to 4 days.  This has now essentially resolved with some occasional mild discomfort currently when he takes in a deep breath.  He denies fever, nausea, vomiting or diarrhea.  He has had normal appetite and bowel movements.  He is scheduled for a colonoscopy at the end of July with Dr. [name].  He has a follow-up appointment with Dr. Ellin Saba in August.  Past Medical History:  Diagnosis Date   Adenomatous colon polyp    Anemia    Carotid bruit    Chronic dyspnea    Diabetes (HCC)    type 2   Diverticular disease    DVT (deep venous thrombosis) (HCC)    lower limb   Dyspnea    Factor V deficiency (HCC)    Hearing loss    bilateral   Hyperlipemia    Hypertension    Inguinal hernia    Kidney disease    stage 3   Lung nodule    Lymphadenopathy    Malignant neoplasm (HCC)    salivary gland   Murmur    Osteoarthritis    Senile purpura (HCC)     Past Surgical History:  Procedure Laterality Date   BIOPSY  01/10/2022   Procedure: BIOPSY;  Surgeon: Dolores Frame, MD;  Location: [facility];  Service: Gastroenterology;;   COLONOSCOPY WITH PROPOFOL N/A 01/10/2022   Procedure: COLONOSCOPY WITH PROPOFOL;  Surgeon:  Dolores Frame, MD;  Location: [facility];  Service: Gastroenterology;  Laterality: N/A;  per Soledad Gerlach, pt to arrive at 9:15   ESOPHAGOGASTRODUODENOSCOPY (EGD) WITH PROPOFOL N/A 01/10/2022   Procedure: ESOPHAGOGASTRODUODENOSCOPY (EGD) WITH PROPOFOL;  Surgeon: Dolores Frame, MD;  Location: [facility];  Service: Gastroenterology;  Laterality: N/A;   HAND / FINGER LESION EXCISION     HERNIA REPAIR     MASTOIDECTOMY     PILONIDAL CYST EXCISION     POLYPECTOMY  01/10/2022   Procedure: POLYPECTOMY;  Surgeon: Dolores Frame, MD;  Location: [facility];  Service: Gastroenterology;;   RADIOLOGY WITH ANESTHESIA N/A 12/27/2022   Procedure: CT microwave ablation of the liver;  Surgeon: Irish Lack, MD;  Location: [facility];  Service: Radiology;  Laterality: N/A;   SUBMUCOSAL TATTOO INJECTION  01/10/2022   Procedure: SUBMUCOSAL TATTOO INJECTION;  Surgeon: Dolores Frame, MD;  Location: [facility];  Service: Gastroenterology;;   SURGERY OF LIP     skin cancer    Allergies: Gadobenate  Medications: Prior to Admission medications   Medication Sig Start Date End Date Taking? Authorizing Provider  amitriptyline (ELAVIL) 10 MG tablet Take 10 mg by mouth every evening.    [provider]  amLODipine (NORVASC) 5 MG tablet Take 5 mg by mouth 2 (two) times daily.  [provider]  ascorbic acid (VITAMIN C/NATURAL ROSE HIPS) 1000 MG tablet Take 1,000 mg by mouth every evening.    [provider]  atorvastatin (LIPITOR) 40 MG tablet Take 40 mg by mouth every evening.    [provider]  carvedilol (COREG) 6.25 MG tablet Take 6.25 mg by mouth 2 (two) times daily with a meal.    [provider]  Cholecalciferol (VITAMIN D3) 125 MCG (5000 UT) CAPS Take 5,000 Units by mouth every evening.    [provider]  dorzolamide (TRUSOPT) 2 % ophthalmic solution Place 1 drop into the left eye 2 (two) times daily.     [provider]  ELIQUIS 5 MG TABS tablet TAKE 1 TABLET(5 MG) BY MOUTH TWICE DAILY 10/05/22   [name], MD  ferrous sulfate 325 (65 FE) MG tablet Take 325 mg by mouth in the morning and at bedtime. Iron Nature's Bounty    [provider]  folic acid (FOLVITE) 800 MCG tablet Take 800 mcg by mouth every evening. With B12 Now (Brand)    [provider]  hydrALAZINE (APRESOLINE) 25 MG tablet Take 50 mg by mouth in the morning and at bedtime.    [provider]  irbesartan (AVAPRO) 300 MG tablet Take 300 mg by mouth every evening. 10/03/21   [provider]  KERENDIA 10 MG TABS Take 10 mg by mouth daily.    [provider]  latanoprost (XALATAN) 0.005 % ophthalmic solution Place 1 drop into both eyes at bedtime.    [provider]  linagliptin (TRADJENTA) 5 MG TABS tablet Take 5 mg by mouth in the morning.    [provider]  loratadine (CLARITIN) 10 MG tablet Take 10 mg by mouth every morning.    [provider]  Misc Natural Products (PROSTATE SUPPORT PO) Take 1 capsule by mouth every evening. NOW Prostate Support    [provider]  omeprazole (PRILOSEC) 40 MG capsule Take 1 capsule (40 mg total) by mouth daily. 01/10/22   Dolores Frame, MD  Sod Picosulfate-Mag Ox-Cit Acd [facility]) 10-3.5-12 MG-GM -GM/175ML SOLN Take 1 kit by mouth as directed. 01/03/23   Dolores Frame, MD  vitamin B-12 (CYANOCOBALAMIN) 1000 MCG tablet Take 1,000 mcg by mouth daily. Now [facility])    [provider]     No family history on file.  Social History   Socioeconomic History   Marital status: Married    Spouse name: Not on file   Number of children: Not on file   Years of education: Not on file   Highest education level: Not on file  Occupational History   Not on file  Tobacco Use   Smoking status: Former    Packs/day: 2.00    Years: 25.00    Additional pack years: 0.00    Total  pack years: 50.00    Types: Cigarettes    Quit date: 72    Years since quitting: 29.4    Passive exposure: Past   Smokeless tobacco: Never  Vaping Use   Vaping Use: Never used  Substance and Sexual Activity   Alcohol use: Never   Drug use: Never   Sexual activity: Not on file  Other Topics Concern   Not on file  Social History Narrative   Not on file   Social Determinants of Health   Financial Resource Strain: Not on file  Food Insecurity: No Food Insecurity (12/27/2022)   Hunger Vital Sign    Worried  About Running Out of Food in the Last Year: Never true    Ran Out of Food in the Last Year: Never true  Transportation Needs: No Transportation Needs (12/27/2022)   PRAPARE - Administrator, Civil Service (Medical): No    Lack of Transportation (Non-Medical): No  Physical Activity: Not on file  Stress: Not on file  Social Connections: Not on file    ECOG Status: 1 - Symptomatic but completely ambulatory  Review of Systems  Constitutional: Negative.   Respiratory: Negative.    Cardiovascular: Negative.   Gastrointestinal:        Minimal abdominal discomfort.  Genitourinary: Negative.   Musculoskeletal: Negative.   Neurological: Negative.     Review of Systems: A 12 point ROS discussed and pertinent positives are indicated in the HPI above.  All other systems are negative.   Physical Exam No direct physical exam was performed (except for noted visual exam findings with Video Visits).   Vital Signs: There were no vitals taken for this visit.  Imaging: CT GUIDE TISSUE ABLATION  Result Date: 12/27/2022 CLINICAL DATA:  Metastatic colorectal carcinoma to the liver with solitary metastatic lesion in the inferior right lobe. The patient presents for percutaneous thermal ablation of the metastatic lesion. EXAM: CT-GUIDED PERCUTANEOUS RADIOFREQUENCY ABLATION OF THE LIVER COMPARISON:  MRI of the abdomen on 11/22/2022 ANESTHESIA/SEDATION: Anesthesia:  General  Medications: 2 g IV cefoxitin. The antibiotic was administered in an appropriate time interval prior to needle puncture of the skin. CONTRAST:  None. PROCEDURE: The procedure, risks, benefits, and alternatives were explained to the patient. Questions regarding the procedure were encouraged and answered. The patient understands and consents to the procedure. A time-out was performed prior to initiating the procedure. The patient was placed under general anesthesia. Initial unenhanced CT was performed in a supine position to localize the liver. Ultrasound was also performed of the liver. The abdominal wall was prepped with chlorhexidine in a sterile fashion, and a sterile drape was applied covering the operative field. A sterile gown and sterile gloves were used for the procedure. Under combined ultrasound and CT guidance, a 15 cm length NeuWave XT percutaneous microwave thermal ablation probe was advanced into the liver. Probe positioning was confirmed by ultrasound and CT prior to ablation. Hydrodissection was also performed via a 22 gauge spinal needle positioned between the inferior tip of the liver and a segment of the hepatic flexure of the colon. A total of approximately 90 mL of a solution of hydrodissection fluid was injected which consisted of 250 mL saline mixed with 5 mL of Omnipaque 300 contrast. Microwave thermal ablation was performed through the single probe. One minute of thermal ablation was performed at 95 watts followed by 10 minutes ablation at 65 watts. Monitoring of ablation was performed real-time by ultrasound. COMPLICATIONS: None FINDINGS: Metastatic lesion within the inferior tip of the right lobe of the liver is visible by unenhanced CT. By ultrasound, the lesion it is difficult to accurately measure. Approximate measurement by unenhanced CT is 2.5 x 3.4 cm transversely. Along the inferior tip of the liver, there is a tiny segment of air in the hepatic flexure that is in fairly close  proximity to the liver capsule. For this reason, hydrodissection was performed prior to ablation which was successful in creating an appropriate gap between the liver and colon. Microwave thermal ablation was monitored by ultrasound with appropriate micro bubble gas formation within the liver encompassing the lesion. IMPRESSION: CT and ultrasound guided percutaneous  microwave thermal ablation of metastatic lesion in the right lobe of the liver. Electronically Signed   By: Irish Lack M.D.   On: 12/27/2022 12:18   DG Chest 1 View  Result Date: 12/27/2022 CLINICAL DATA:  Preoperative study.  No chest complaints. EXAM: CHEST  1 VIEW COMPARISON:  PET-CT dated November 09, 2022. FINDINGS: The heart size and mediastinal contours are within normal limits. Normal pulmonary vascularity. No focal consolidation, pleural effusion, or pneumothorax. No acute osseous abnormality. IMPRESSION: 1. No active disease. Electronically Signed   By: Obie Dredge M.D.   On: 12/27/2022 11:05    Labs:  CBC: Recent Labs    10/19/22 1402 12/14/22 1402 12/27/22 0830 12/28/22 0557  WBC 6.1 7.1 9.4 12.7*  HGB 12.0* 12.2* 11.9* 10.8*  HCT 35.3* 36.0* 35.0* 32.3*  PLT 188 193 174 164    COAGS: Recent Labs    12/27/22 0830  INR 1.1    BMP: Recent Labs    07/13/22 1158 10/19/22 1402 12/14/22 1402 12/27/22 0830  NA 137 136 139 140  K 4.5 4.1 4.0 3.5  CL 105 106 109 101  CO2 25 21* 22 28  GLUCOSE 115* 137* 149* 138*  BUN 34* 38* 32* 33*  CALCIUM 9.0 8.7* 8.8* 8.5*  CREATININE 1.26* 1.34* 1.26* 1.50*  GFRNONAA 56* 52* 56* 45*    LIVER FUNCTION TESTS: Recent Labs    03/16/22 1003 07/13/22 1158 10/19/22 1402 12/27/22 0830  BILITOT 0.7 1.1 1.3* 2.7*  AST 27 26 28 22   ALT 34 33 36 23  ALKPHOS 87 102 114 86  PROT 6.8 6.9 6.9 6.9  ALBUMIN 3.6 3.6 3.6 3.8     Assessment and Plan:  Mr. Davin is doing well following percutaneous thermal ablation of a solitary liver metastasis.  I recommended  follow-up restaging imaging in August around the time that he is scheduled to see Dr. Ellin Saba.  I will let Dr. Marice Potter office coordinate and schedule that imaging since he is usually imaged at [facility]. Presumably blood work will also be checked at that time including CEA. I will plan to go over how the ablation site looks with Mr. Summerlin after follow up imaging.    Electronically Signed: Reola Calkins 01/12/2023, 2:18 PM    I spent a total of  10 Minutes in remote  clinical consultation, greater than 50% of which was counseling/coordinating care post thermal ablation of a liver metastasis.    Visit type: Audio only (telephone). Audio (no video) only due to patient's lack of internet/smartphone capability. Alternative for in-person consultation at [facility], [address] [place], [place], [place]. This visit type was conducted due to national recommendations for restrictions regarding the COVID-19 Pandemic (e.g. social distancing).  This format is felt to be most appropriate for this patient at this time.  All issues noted in this document were discussed and addressed.

## 2023-01-17 NOTE — Telephone Encounter (Signed)
Resent clearance to Dr.K

## 2023-01-26 NOTE — Telephone Encounter (Signed)
Clearance re-sent

## 2023-02-05 ENCOUNTER — Ambulatory Visit (INDEPENDENT_AMBULATORY_CARE_PROVIDER_SITE_OTHER): Payer: Medicare Other | Admitting: Gastroenterology

## 2023-02-05 ENCOUNTER — Encounter (INDEPENDENT_AMBULATORY_CARE_PROVIDER_SITE_OTHER): Payer: Self-pay | Admitting: Gastroenterology

## 2023-02-05 VITALS — BP 163/78 | HR 82 | Temp 97.6°F | Ht 70.0 in | Wt 210.7 lb

## 2023-02-05 DIAGNOSIS — K219 Gastro-esophageal reflux disease without esophagitis: Secondary | ICD-10-CM | POA: Diagnosis not present

## 2023-02-05 DIAGNOSIS — R1012 Left upper quadrant pain: Secondary | ICD-10-CM | POA: Diagnosis not present

## 2023-02-05 DIAGNOSIS — R112 Nausea with vomiting, unspecified: Secondary | ICD-10-CM | POA: Insufficient documentation

## 2023-02-05 MED ORDER — SUCRALFATE 1 GM/10ML PO SUSP: 1.0000 g | Freq: Four times a day (QID) | ORAL | 1 refills | Status: DC

## 2023-02-05 NOTE — Patient Instructions (Addendum)
Continue with omeprazole 40mg  daily I am sending carafate 1g to be taken before meals and at bedtime  Avoid greasy, spicy, heavy foods for the next few days  Please let me know if you have any worsening symptoms or your symptoms fail to improve  We will plan for colonoscopy as scheduled on 7/30  Follow up 3 months

## 2023-02-05 NOTE — H&P (View-Only) (Signed)
Referring Provider: Pomposini, Rande Brunt, MD Primary Care Physician:  Jonelle Sports Rande Brunt, MD Primary GI Physician: [name]   Chief Complaint  Patient presents with   Nausea    Having nausea and vomiting. Had two episodes last week ( last Thursday and last night ) concerned about an area on abdomen that looks swollen to him.    HPI:   Herbert Wood is a 88 y.o. 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  However, the day after his visit he developed abdominal pain was admitted to the hospital after presenting possible diverticulitis with intramural abscess for which she was given ciprofloxacin and Flagyl.  GI consulted and the recommendation was to defer endoscopic evaluations at least 2 months after his initial presentation.Patient underwent antibiotic management for diverticulitis with intramural abscess. He was discharged home on ciprofloxacin and metronidazole.  He finished his antibiotics and reported feeling better afterwards.  Last seen may 2023, at that time, having nausea and vomiting with some lightheadedness, taking oral iron BID.   Recommended to have EGD/Colonoscopy in June, continue with PO iron BID, check orthostatics and discuss with PCP.  EGD and Colonoscopy as below, CT C/A/P in June 2023 without metastatic disease.  -patient underwent L hemicolectomy on 02/17/22  -Repeat PET san April 2024 with mets to R hepatic lobe  -MR liver 11/22/22 Isolated inferior right hepatic lobe 2.8 cm metastasis.Apparent proximal gastric wall thickening is at least partially due to underdistention. Correlate with symptoms of  gastritis. -Underwent percutaneous microwave thermal ablation of liver lesion on 12/27/22  Present:  He is following with Dr. Kirtland Bouchard, seeing him again in the coming weeks. He has not had chemo or radiation thus far. Decision on this is pending upcoming labs and imaging. Denies rectal bleeding, melena, having more constipation since hemicolectomy.   He notes he is having some abdominal pain that began on Thursday after he ate at [employer], states food was somewhat spicy. Noting he had some abdominal discomfort and indigestion after this. He felt better Friday. Yesterday he ate [place] food for lunch and began having some more abdominal pain, ate some apple pie with ice cream and notes abdominal pain worsened again. He vomited up some clear contents. He was able to keep some gatorade down. He is belching a lot recently. He does note more recent heartburn and acid regurgitation. Wife notes that he ran out of his omeprazole Rx and was off of PPI therapy for a few weeks restarted about 1 month ago when she got otc omeprazole and he had been taking 20mg  daily until Saturday when he felt sick, they increased back to 40mg  daily. No fevers or chills.   Last Colonoscopy: 12/2021- Two 2 to 6 mm polyps in the transverse colon,                            removed with a cold snare. Resected and retrieved.                           - Likely malignant partially obstructing tumor at  42 cm proximal to the anus. Biopsied. Tattooed(invasive moderately differentiated adenocarcinoma)                            - The distal rectum and anal verge are normal on                            retroflexion view.  Last Endoscopy:12/2021 - Esophageal mucosal changes classified as                            Barrett's stage C0-M1 per Prague criteria. Biopsied-BE                           - 5 cm hiatal hernia.                           - Erythematous mucosa in the antrum. Biopsied.                           -  Duodenitis. Biopsied-chronic/peptic duodenitis  Recommendations:  Repeat EGD in 5 years   Past Medical History:  Diagnosis Date   Adenomatous colon polyp    Anemia    Carotid bruit    Chronic dyspnea    Diabetes (HCC)    type 2   Diverticular disease    DVT (deep venous thrombosis) (HCC)    lower limb   Dyspnea    Factor V deficiency (HCC)    Hearing loss    bilateral   Hyperlipemia    Hypertension    Inguinal hernia    Kidney disease    stage 3   Lung nodule    Lymphadenopathy    Malignant neoplasm (HCC)    salivary gland   Murmur    Osteoarthritis    Senile purpura (HCC)     Past Surgical History:  Procedure Laterality Date   BIOPSY  01/10/2022   Procedure: BIOPSY;  Surgeon: Dolores Frame, MD;  Location: [facility];  Service: Gastroenterology;;   COLONOSCOPY WITH PROPOFOL N/A 01/10/2022   Procedure: COLONOSCOPY WITH PROPOFOL;  Surgeon: Dolores Frame, MD;  Location: [facility];  Service: Gastroenterology;  Laterality: N/A;  per Soledad Gerlach, pt to arrive at 9:15   ESOPHAGOGASTRODUODENOSCOPY (EGD) WITH PROPOFOL N/A 01/10/2022   Procedure: ESOPHAGOGASTRODUODENOSCOPY (EGD) WITH PROPOFOL;  Surgeon: Dolores Frame, MD;  Location: [facility];  Service: Gastroenterology;  Laterality: N/A;   HAND / FINGER LESION EXCISION     HERNIA REPAIR     MASTOIDECTOMY     PILONIDAL CYST EXCISION     POLYPECTOMY  01/10/2022   Procedure: POLYPECTOMY;  Surgeon: Dolores Frame, MD;  Location: [facility];  Service: Gastroenterology;;   RADIOLOGY WITH ANESTHESIA N/A 12/27/2022   Procedure: CT microwave ablation of the liver;  Surgeon: Irish Lack, MD;  Location: [facility];  Service: Radiology;  Laterality: N/A;   SUBMUCOSAL TATTOO INJECTION  01/10/2022   Procedure: SUBMUCOSAL TATTOO INJECTION;  Surgeon: Marguerita Merles, Reuel Boom, MD;  Location: [facility];  Service: Gastroenterology;;   SURGERY OF LIP     skin cancer    Current  Outpatient Medications  Medication Sig Dispense Refill   amitriptyline (ELAVIL) 10 MG tablet Take 10 mg by mouth every evening.  amLODipine (NORVASC) 5 MG tablet Take 5 mg by mouth 2 (two) times daily.     ascorbic acid (VITAMIN C/NATURAL ROSE HIPS) 1000 MG tablet Take 1,000 mg by mouth every evening.     atorvastatin (LIPITOR) 40 MG tablet Take 40 mg by mouth every evening.     carvedilol (COREG) 6.25 MG tablet Take 6.25 mg by mouth 2 (two) times daily with a meal.     Cholecalciferol (VITAMIN D3) 125 MCG (5000 UT) CAPS Take 5,000 Units by mouth every evening.     dorzolamide (TRUSOPT) 2 % ophthalmic solution Place 1 drop into the left eye 2 (two) times daily.     ELIQUIS 5 MG TABS tablet TAKE 1 TABLET(5 MG) BY MOUTH TWICE DAILY 60 tablet 3   ferrous sulfate 325 (65 FE) MG tablet Take 325 mg by mouth in the morning and at bedtime. Iron Nature's Bounty     folic acid (FOLVITE) 800 MCG tablet Take 800 mcg by mouth every evening. With B12 Now (Brand)     hydrALAZINE (APRESOLINE) 25 MG tablet Take 50 mg by mouth in the morning and at bedtime.     irbesartan (AVAPRO) 300 MG tablet Take 300 mg by mouth every evening.     KERENDIA 10 MG TABS Take 10 mg by mouth daily.     latanoprost (XALATAN) 0.005 % ophthalmic solution Place 1 drop into both eyes at bedtime.     linagliptin (TRADJENTA) 5 MG TABS tablet Take 5 mg by mouth in the morning.     loratadine (CLARITIN) 10 MG tablet Take 10 mg by mouth every morning.     Misc Natural Products (PROSTATE SUPPORT PO) Take 1 capsule by mouth every evening. NOW Prostate Support     omeprazole (PRILOSEC) 40 MG capsule Take 1 capsule (40 mg total) by mouth daily. 90 capsule 3   Sod Picosulfate-Mag Ox-Cit Acd (CLENPIQ) 10-3.5-12 MG-GM -GM/175ML SOLN Take 1 kit by mouth as directed. 350 mL 0   vitamin B-12 (CYANOCOBALAMIN) 1000 MCG tablet Take 1,000 mcg by mouth daily. Now (Brand)     No current facility-administered medications for this visit.    Allergies  as of 02/05/2023 - Review Complete 02/05/2023  Allergen Reaction Noted   Gadobenate Nausea And Vomiting 05/20/2015    No family history on file.  Social History   Socioeconomic History   Marital status: Married    Spouse name: Not on file   Number of children: Not on file   Years of education: Not on file   Highest education level: Not on file  Occupational History   Not on file  Tobacco Use   Smoking status: Former    Packs/day: 2.00    Years: 25.00    Additional pack years: 0.00    Total pack years: 50.00    Types: Cigarettes    Quit date: 65    Years since quitting: 29.5    Passive exposure: Past   Smokeless tobacco: Never  Vaping Use   Vaping Use: Never used  Substance and Sexual Activity   Alcohol use: Never   Drug use: Never   Sexual activity: Not on file  Other Topics Concern   Not on file  Social History Narrative   Not on file   Social Determinants of Health   Financial Resource Strain: Not on file  Food Insecurity: No Food Insecurity (12/27/2022)   Hunger Vital Sign    Worried About Running Out of Food in the Last Year: Never true  Ran Out of Food in the Last Year: Never true  Transportation Needs: No Transportation Needs (12/27/2022)   PRAPARE - Administrator, Civil Service (Medical): No    Lack of Transportation (Non-Medical): No  Physical Activity: Not on file  Stress: Not on file  Social Connections: Not on file    Review of systems General: negative for malaise, night sweats, fever, chills, weight loss Neck: Negative for lumps, goiter, pain and significant neck swelling Resp: Negative for cough, wheezing, dyspnea at rest CV: Negative for chest pain, leg swelling, palpitations, orthopnea GI: denies melena, hematochezia, diarrhea, constipation, dysphagia, odyonophagia, early satiety or unintentional weight loss. +nausea/vomiting +abdominal pain +GERD symptoms  MSK: Negative for joint pain or swelling, back pain, and muscle  pain. Derm: Negative for itching or rash Psych: Denies depression, anxiety, memory loss, confusion. No homicidal or suicidal ideation.  Heme: Negative for prolonged bleeding, bruising easily, and swollen nodes. Endocrine: Negative for cold or heat intolerance, polyuria, polydipsia and goiter. Neuro: negative for tremor, gait imbalance, syncope and seizures. The remainder of the review of systems is noncontributory.  Physical Exam: BP (!) 163/78   Pulse 82   Temp 97.6 F (36.4 C) (Oral)   Ht 5\' 10"  (1.778 m)   Wt 210 lb 11.2 oz (95.6 kg)   BMI 30.23 kg/m  General:   Alert and oriented. No distress noted. Pleasant and cooperative.  Head:  Normocephalic and atraumatic. Eyes:  Conjuctiva clear without scleral icterus. Mouth:  Oral mucosa pink and moist. Good dentition. No lesions. Heart: Normal rate and rhythm, s1 and s2 heart sounds present.  Lungs: Clear lung sounds in all lobes. Respirations equal and unlabored. Abdomen:  +BS, soft, non-tender and non-distended. No rebound or guarding. No HSM or masses noted. Derm: No palmar erythema or jaundice Msk:  Symmetrical without gross deformities. Normal posture. Extremities:  Without edema. Neurologic:  Alert and  oriented x4 Psych:  Alert and cooperative. Normal mood and affect.  Invalid input(s): "6 MONTHS"   ASSESSMENT: Herbert Wood is a 88 y.o. male presenting today for nausea/vomiting/GERD/abdominal pain  Patient noting upper left abdominal pain with some nausea, GERD symptoms that began after eating some spicy food Thursday night. Symptoms persisted after he ate spicy food/ice cream over the weekend. Having more indigestion. Recently on lower dose of omeprazole (20mg ) than prescribed as he ran out and was taking otc dosing, just recently increased to 40mg  of omeprazole on Saturday. His abdominal exam is benign. He has no rectal bleeding, melena or diarrhea. No fevers or chills. Hisotyr of peptic duodenitis on EGD last year,  differentials include duodenitis, GERD flare, gastritis,  Recommend continued omeprazole 40mg  daily for now, will send carafate 1g QID as well. Should avoid greasy, spicy, heavy foods and maintain a bland/soft diet for the next few days. He will let me know if symptoms are not improving.  Patient is scheduled for colonoscopy on 7/30 to follow up after R hemicolectomy for adenocarcinoma. He should keep appt for scheduled colonoscopy   PLAN:  Cotninue omeprazole 40mg  daily  2. Rx carafate 1g QID  3. Avoid greasy, spicy, heavy foods for the next few days 4. Pt to make me aware of any new or worsening GI symptoms 5. Keep scheduled Colonoscopy on 7/30  All questions were answered, patient verbalized understanding and is in agreement with plan as outlined above.   Follow Up: 3 months   Chelsea L. Jeanmarie Hubert, MSN, APRN, AGNP-C Adult-Gerontology Nurse Practitioner [facility]  I have reviewed the note and agree with the APP's assessment as described in this progress note  Katrinka Blazing, MD Gastroenterology and Hepatology [facility]

## 2023-02-05 NOTE — Telephone Encounter (Signed)
Verbal order from Dr.K nurse Lequita Halt to hold Eliquis 2-3 days prior to procedure. Will hold 2 days per protocol. Will sent instructions via mail.

## 2023-02-05 NOTE — Progress Notes (Signed)
Referring Provider: Pomposini, Rande Brunt, MD Primary Care Physician:  Jonelle Sports Rande Brunt, MD Primary GI Physician: [name]   Chief Complaint  Patient presents with   Nausea    Having nausea and vomiting. Had two episodes last week ( last Thursday and last night ) concerned about an area on abdomen that looks swollen to him.    HPI:   Herbert Wood is a 88 y.o. 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  However, the day after his visit he developed abdominal pain was admitted to the hospital after presenting possible diverticulitis with intramural abscess for which she was given ciprofloxacin and Flagyl.  GI consulted and the recommendation was to defer endoscopic evaluations at least 2 months after his initial presentation.Patient underwent antibiotic management for diverticulitis with intramural abscess. He was discharged home on ciprofloxacin and metronidazole.  He finished his antibiotics and reported feeling better afterwards.  Last seen may 2023, at that time, having nausea and vomiting with some lightheadedness, taking oral iron BID.   Recommended to have EGD/Colonoscopy in June, continue with PO iron BID, check orthostatics and discuss with PCP.  EGD and Colonoscopy as below, CT C/A/P in June 2023 without metastatic disease.  -patient underwent L hemicolectomy on 02/17/22  -Repeat PET san April 2024 with mets to R hepatic lobe  -MR liver 11/22/22 Isolated inferior right hepatic lobe 2.8 cm metastasis.Apparent proximal gastric wall thickening is at least partially due to underdistention. Correlate with symptoms of  gastritis. -Underwent percutaneous microwave thermal ablation of liver lesion on 12/27/22  Present:  He is following with Dr. Kirtland Bouchard, seeing him again in the coming weeks. He has not had chemo or radiation thus far. Decision on this is pending upcoming labs and imaging. Denies rectal bleeding, melena, having more constipation since hemicolectomy.   He notes he is having some abdominal pain that began on Thursday after he ate at [employer], states food was somewhat spicy. Noting he had some abdominal discomfort and indigestion after this. He felt better Friday. Yesterday he ate [place] food for lunch and began having some more abdominal pain, ate some apple pie with ice cream and notes abdominal pain worsened again. He vomited up some clear contents. He was able to keep some gatorade down. He is belching a lot recently. He does note more recent heartburn and acid regurgitation. Wife notes that he ran out of his omeprazole Rx and was off of PPI therapy for a few weeks restarted about 1 month ago when she got otc omeprazole and he had been taking 20mg  daily until Saturday when he felt sick, they increased back to 40mg  daily. No fevers or chills.   Last Colonoscopy: 12/2021- Two 2 to 6 mm polyps in the transverse colon,                            removed with a cold snare. Resected and retrieved.                           - Likely malignant partially obstructing tumor at  42 cm proximal to the anus. Biopsied. Tattooed(invasive moderately differentiated adenocarcinoma)                            - The distal rectum and anal verge are normal on                            retroflexion view.  Last Endoscopy:12/2021 - Esophageal mucosal changes classified as                            Barrett's stage C0-M1 per Prague criteria. Biopsied-BE                           - 5 cm hiatal hernia.                           - Erythematous mucosa in the antrum. Biopsied.                           -  Duodenitis. Biopsied-chronic/peptic duodenitis  Recommendations:  Repeat EGD in 5 years   Past Medical History:  Diagnosis Date   Adenomatous colon polyp    Anemia    Carotid bruit    Chronic dyspnea    Diabetes (HCC)    type 2   Diverticular disease    DVT (deep venous thrombosis) (HCC)    lower limb   Dyspnea    Factor V deficiency (HCC)    Hearing loss    bilateral   Hyperlipemia    Hypertension    Inguinal hernia    Kidney disease    stage 3   Lung nodule    Lymphadenopathy    Malignant neoplasm (HCC)    salivary gland   Murmur    Osteoarthritis    Senile purpura (HCC)     Past Surgical History:  Procedure Laterality Date   BIOPSY  01/10/2022   Procedure: BIOPSY;  Surgeon: Dolores Frame, MD;  Location: [facility];  Service: Gastroenterology;;   COLONOSCOPY WITH PROPOFOL N/A 01/10/2022   Procedure: COLONOSCOPY WITH PROPOFOL;  Surgeon: Dolores Frame, MD;  Location: [facility];  Service: Gastroenterology;  Laterality: N/A;  per Soledad Gerlach, pt to arrive at 9:15   ESOPHAGOGASTRODUODENOSCOPY (EGD) WITH PROPOFOL N/A 01/10/2022   Procedure: ESOPHAGOGASTRODUODENOSCOPY (EGD) WITH PROPOFOL;  Surgeon: Dolores Frame, MD;  Location: [facility];  Service: Gastroenterology;  Laterality: N/A;   HAND / FINGER LESION EXCISION     HERNIA REPAIR     MASTOIDECTOMY     PILONIDAL CYST EXCISION     POLYPECTOMY  01/10/2022   Procedure: POLYPECTOMY;  Surgeon: Dolores Frame, MD;  Location: [facility];  Service: Gastroenterology;;   RADIOLOGY WITH ANESTHESIA N/A 12/27/2022   Procedure: CT microwave ablation of the liver;  Surgeon: Irish Lack, MD;  Location: [facility];  Service: Radiology;  Laterality: N/A;   SUBMUCOSAL TATTOO INJECTION  01/10/2022   Procedure: SUBMUCOSAL TATTOO INJECTION;  Surgeon: Marguerita Merles, Reuel Boom, MD;  Location: [facility];  Service: Gastroenterology;;   SURGERY OF LIP     skin cancer    Current  Outpatient Medications  Medication Sig Dispense Refill   amitriptyline (ELAVIL) 10 MG tablet Take 10 mg by mouth every evening.  amLODipine (NORVASC) 5 MG tablet Take 5 mg by mouth 2 (two) times daily.     ascorbic acid (VITAMIN C/NATURAL ROSE HIPS) 1000 MG tablet Take 1,000 mg by mouth every evening.     atorvastatin (LIPITOR) 40 MG tablet Take 40 mg by mouth every evening.     carvedilol (COREG) 6.25 MG tablet Take 6.25 mg by mouth 2 (two) times daily with a meal.     Cholecalciferol (VITAMIN D3) 125 MCG (5000 UT) CAPS Take 5,000 Units by mouth every evening.     dorzolamide (TRUSOPT) 2 % ophthalmic solution Place 1 drop into the left eye 2 (two) times daily.     ELIQUIS 5 MG TABS tablet TAKE 1 TABLET(5 MG) BY MOUTH TWICE DAILY 60 tablet 3   ferrous sulfate 325 (65 FE) MG tablet Take 325 mg by mouth in the morning and at bedtime. Iron Nature's Bounty     folic acid (FOLVITE) 800 MCG tablet Take 800 mcg by mouth every evening. With B12 Now (Brand)     hydrALAZINE (APRESOLINE) 25 MG tablet Take 50 mg by mouth in the morning and at bedtime.     irbesartan (AVAPRO) 300 MG tablet Take 300 mg by mouth every evening.     KERENDIA 10 MG TABS Take 10 mg by mouth daily.     latanoprost (XALATAN) 0.005 % ophthalmic solution Place 1 drop into both eyes at bedtime.     linagliptin (TRADJENTA) 5 MG TABS tablet Take 5 mg by mouth in the morning.     loratadine (CLARITIN) 10 MG tablet Take 10 mg by mouth every morning.     Misc Natural Products (PROSTATE SUPPORT PO) Take 1 capsule by mouth every evening. NOW Prostate Support     omeprazole (PRILOSEC) 40 MG capsule Take 1 capsule (40 mg total) by mouth daily. 90 capsule 3   Sod Picosulfate-Mag Ox-Cit Acd (CLENPIQ) 10-3.5-12 MG-GM -GM/175ML SOLN Take 1 kit by mouth as directed. 350 mL 0   vitamin B-12 (CYANOCOBALAMIN) 1000 MCG tablet Take 1,000 mcg by mouth daily. Now (Brand)     No current facility-administered medications for this visit.    Allergies  as of 02/05/2023 - Review Complete 02/05/2023  Allergen Reaction Noted   Gadobenate Nausea And Vomiting 05/20/2015    No family history on file.  Social History   Socioeconomic History   Marital status: Married    Spouse name: Not on file   Number of children: Not on file   Years of education: Not on file   Highest education level: Not on file  Occupational History   Not on file  Tobacco Use   Smoking status: Former    Packs/day: 2.00    Years: 25.00    Additional pack years: 0.00    Total pack years: 50.00    Types: Cigarettes    Quit date: 65    Years since quitting: 29.5    Passive exposure: Past   Smokeless tobacco: Never  Vaping Use   Vaping Use: Never used  Substance and Sexual Activity   Alcohol use: Never   Drug use: Never   Sexual activity: Not on file  Other Topics Concern   Not on file  Social History Narrative   Not on file   Social Determinants of Health   Financial Resource Strain: Not on file  Food Insecurity: No Food Insecurity (12/27/2022)   Hunger Vital Sign    Worried About Running Out of Food in the Last Year: Never true  Ran Out of Food in the Last Year: Never true  Transportation Needs: No Transportation Needs (12/27/2022)   PRAPARE - Administrator, Civil Service (Medical): No    Lack of Transportation (Non-Medical): No  Physical Activity: Not on file  Stress: Not on file  Social Connections: Not on file    Review of systems General: negative for malaise, night sweats, fever, chills, weight loss Neck: Negative for lumps, goiter, pain and significant neck swelling Resp: Negative for cough, wheezing, dyspnea at rest CV: Negative for chest pain, leg swelling, palpitations, orthopnea GI: denies melena, hematochezia, diarrhea, constipation, dysphagia, odyonophagia, early satiety or unintentional weight loss. +nausea/vomiting +abdominal pain +GERD symptoms  MSK: Negative for joint pain or swelling, back pain, and muscle  pain. Derm: Negative for itching or rash Psych: Denies depression, anxiety, memory loss, confusion. No homicidal or suicidal ideation.  Heme: Negative for prolonged bleeding, bruising easily, and swollen nodes. Endocrine: Negative for cold or heat intolerance, polyuria, polydipsia and goiter. Neuro: negative for tremor, gait imbalance, syncope and seizures. The remainder of the review of systems is noncontributory.  Physical Exam: BP (!) 163/78   Pulse 82   Temp 97.6 F (36.4 C) (Oral)   Ht 5\' 10"  (1.778 m)   Wt 210 lb 11.2 oz (95.6 kg)   BMI 30.23 kg/m  General:   Alert and oriented. No distress noted. Pleasant and cooperative.  Head:  Normocephalic and atraumatic. Eyes:  Conjuctiva clear without scleral icterus. Mouth:  Oral mucosa pink and moist. Good dentition. No lesions. Heart: Normal rate and rhythm, s1 and s2 heart sounds present.  Lungs: Clear lung sounds in all lobes. Respirations equal and unlabored. Abdomen:  +BS, soft, non-tender and non-distended. No rebound or guarding. No HSM or masses noted. Derm: No palmar erythema or jaundice Msk:  Symmetrical without gross deformities. Normal posture. Extremities:  Without edema. Neurologic:  Alert and  oriented x4 Psych:  Alert and cooperative. Normal mood and affect.  Invalid input(s): "6 MONTHS"   ASSESSMENT: Herbert Wood is a 88 y.o. male presenting today for nausea/vomiting/GERD/abdominal pain  Patient noting upper left abdominal pain with some nausea, GERD symptoms that began after eating some spicy food Thursday night. Symptoms persisted after he ate spicy food/ice cream over the weekend. Having more indigestion. Recently on lower dose of omeprazole (20mg ) than prescribed as he ran out and was taking otc dosing, just recently increased to 40mg  of omeprazole on Saturday. His abdominal exam is benign. He has no rectal bleeding, melena or diarrhea. No fevers or chills. Hisotyr of peptic duodenitis on EGD last year,  differentials include duodenitis, GERD flare, gastritis,  Recommend continued omeprazole 40mg  daily for now, will send carafate 1g QID as well. Should avoid greasy, spicy, heavy foods and maintain a bland/soft diet for the next few days. He will let me know if symptoms are not improving.  Patient is scheduled for colonoscopy on 7/30 to follow up after R hemicolectomy for adenocarcinoma. He should keep appt for scheduled colonoscopy   PLAN:  Cotninue omeprazole 40mg  daily  2. Rx carafate 1g QID  3. Avoid greasy, spicy, heavy foods for the next few days 4. Pt to make me aware of any new or worsening GI symptoms 5. Keep scheduled Colonoscopy on 7/30  All questions were answered, patient verbalized understanding and is in agreement with plan as outlined above.   Follow Up: 3 months   Katianna Mcclenney L. Jeanmarie Hubert, MSN, APRN, AGNP-C Adult-Gerontology Nurse Practitioner [facility]  I have reviewed the note and agree with the APP's assessment as described in this progress note  Katrinka Blazing, MD Gastroenterology and Hepatology [facility]

## 2023-02-07 ENCOUNTER — Telehealth (INDEPENDENT_AMBULATORY_CARE_PROVIDER_SITE_OTHER): Payer: Self-pay | Admitting: *Deleted

## 2023-02-07 ENCOUNTER — Emergency Department (HOSPITAL_COMMUNITY): Payer: Medicare Other

## 2023-02-07 ENCOUNTER — Observation Stay (HOSPITAL_COMMUNITY)
Admission: EM | Admit: 2023-02-07 | Discharge: 2023-02-08 | Disposition: A | Discharge status: Home / Self Care | Payer: Medicare Other | Attending: Internal Medicine | Admitting: Internal Medicine

## 2023-02-07 ENCOUNTER — Encounter (HOSPITAL_COMMUNITY): Payer: Self-pay | Admitting: Emergency Medicine

## 2023-02-07 ENCOUNTER — Other Ambulatory Visit: Payer: Self-pay

## 2023-02-07 DIAGNOSIS — N179 Acute kidney failure, unspecified: Secondary | ICD-10-CM | POA: Diagnosis not present

## 2023-02-07 DIAGNOSIS — C187 Malignant neoplasm of sigmoid colon: Secondary | ICD-10-CM | POA: Diagnosis present

## 2023-02-07 DIAGNOSIS — Z85038 Personal history of other malignant neoplasm of large intestine: Secondary | ICD-10-CM | POA: Insufficient documentation

## 2023-02-07 DIAGNOSIS — Z7901 Long term (current) use of anticoagulants: Secondary | ICD-10-CM | POA: Diagnosis not present

## 2023-02-07 DIAGNOSIS — W19XXXA Unspecified fall, initial encounter: Secondary | ICD-10-CM | POA: Diagnosis present

## 2023-02-07 DIAGNOSIS — Z87891 Personal history of nicotine dependence: Secondary | ICD-10-CM | POA: Diagnosis not present

## 2023-02-07 DIAGNOSIS — R42 Dizziness and giddiness: Secondary | ICD-10-CM | POA: Diagnosis present

## 2023-02-07 DIAGNOSIS — N1831 Chronic kidney disease, stage 3a: Secondary | ICD-10-CM | POA: Diagnosis not present

## 2023-02-07 DIAGNOSIS — I129 Hypertensive chronic kidney disease with stage 1 through stage 4 chronic kidney disease, or unspecified chronic kidney disease: Secondary | ICD-10-CM | POA: Diagnosis not present

## 2023-02-07 DIAGNOSIS — Z79899 Other long term (current) drug therapy: Secondary | ICD-10-CM | POA: Diagnosis not present

## 2023-02-07 DIAGNOSIS — E119 Type 2 diabetes mellitus without complications: Secondary | ICD-10-CM

## 2023-02-07 DIAGNOSIS — E1122 Type 2 diabetes mellitus with diabetic chronic kidney disease: Secondary | ICD-10-CM | POA: Diagnosis not present

## 2023-02-07 DIAGNOSIS — N189 Chronic kidney disease, unspecified: Secondary | ICD-10-CM | POA: Diagnosis present

## 2023-02-07 DIAGNOSIS — Z7902 Long term (current) use of antithrombotics/antiplatelets: Secondary | ICD-10-CM | POA: Insufficient documentation

## 2023-02-07 DIAGNOSIS — D72829 Elevated white blood cell count, unspecified: Secondary | ICD-10-CM | POA: Diagnosis not present

## 2023-02-07 DIAGNOSIS — I10 Essential (primary) hypertension: Secondary | ICD-10-CM | POA: Diagnosis present

## 2023-02-07 DIAGNOSIS — Z86718 Personal history of other venous thrombosis and embolism: Secondary | ICD-10-CM | POA: Diagnosis not present

## 2023-02-07 LAB — BASIC METABOLIC PANEL
Anion gap: 10 (ref 5–15)
BUN: 50 mg/dL — ABNORMAL HIGH (ref 8–23)
CO2: 22 mmol/L (ref 22–32)
Calcium: 8.3 mg/dL — ABNORMAL LOW (ref 8.9–10.3)
Chloride: 100 mmol/L (ref 98–111)
Creatinine, Ser: 2.33 mg/dL — ABNORMAL HIGH (ref 0.61–1.24)
GFR, Estimated: 27 mL/min — ABNORMAL LOW (ref >60)
Glucose, Bld: 107 mg/dL — ABNORMAL HIGH (ref 70–99)
Potassium: 3.9 mmol/L (ref 3.5–5.1)
Sodium: 132 mmol/L — ABNORMAL LOW (ref 135–145)

## 2023-02-07 LAB — CBC
HCT: 35.8 % — ABNORMAL LOW (ref 39.0–52.0)
Hemoglobin: 12.2 g/dL — ABNORMAL LOW (ref 13.0–17.0)
MCH: 33 pg (ref 26.0–34.0)
MCHC: 34.1 g/dL (ref 30.0–36.0)
MCV: 96.8 fL (ref 80.0–100.0)
Platelets: 150 10*3/uL (ref 150–400)
RBC: 3.7 MIL/uL — ABNORMAL LOW (ref 4.22–5.81)
RDW: 12.7 % (ref 11.5–15.5)
WBC: 16.3 10*3/uL — ABNORMAL HIGH (ref 4.0–10.5)
nRBC: 0 % (ref 0.0–0.2)

## 2023-02-07 LAB — URINALYSIS, ROUTINE W REFLEX MICROSCOPIC
Bilirubin Urine: NEGATIVE
Glucose, UA: NEGATIVE mg/dL
Hgb urine dipstick: NEGATIVE
Ketones, ur: NEGATIVE mg/dL
Leukocytes,Ua: NEGATIVE
Nitrite: NEGATIVE
Protein, ur: >=300 mg/dL — AB
Specific Gravity, Urine: 1.013 (ref 1.005–1.030)
pH: 5 (ref 5.0–8.0)

## 2023-02-07 LAB — HEPATIC FUNCTION PANEL
ALT: 48 U/L — ABNORMAL HIGH (ref 0–44)
AST: 25 U/L (ref 15–41)
Albumin: 2.8 g/dL — ABNORMAL LOW (ref 3.5–5.0)
Alkaline Phosphatase: 87 U/L (ref 38–126)
Bilirubin, Direct: 0.4 mg/dL — ABNORMAL HIGH (ref 0.0–0.2)
Indirect Bilirubin: 1.8 mg/dL — ABNORMAL HIGH (ref 0.3–0.9)
Total Bilirubin: 2.2 mg/dL — ABNORMAL HIGH (ref 0.3–1.2)
Total Protein: 5.8 g/dL — ABNORMAL LOW (ref 6.5–8.1)

## 2023-02-07 LAB — TROPONIN I (HIGH SENSITIVITY)
Troponin I (High Sensitivity): 19 ng/L — ABNORMAL HIGH (ref <18)
Troponin I (High Sensitivity): 19 ng/L — ABNORMAL HIGH (ref <18)

## 2023-02-07 LAB — MAGNESIUM: Magnesium: 1.5 mg/dL — ABNORMAL LOW (ref 1.7–2.4)

## 2023-02-07 LAB — PROTIME-INR
INR: 1.8 — ABNORMAL HIGH (ref 0.8–1.2)
Prothrombin Time: 21.5 seconds — ABNORMAL HIGH (ref 11.4–15.2)

## 2023-02-07 LAB — PHOSPHORUS: Phosphorus: 3.2 mg/dL (ref 2.5–4.6)

## 2023-02-07 LAB — CBG MONITORING, ED: Glucose-Capillary: 99 mg/dL (ref 70–99)

## 2023-02-07 MED ORDER — ONDANSETRON HCL 4 MG PO TABS: 4.0000 mg | ORAL_TABLET | Freq: Four times a day (QID) | ORAL | Status: DC | PRN

## 2023-02-07 MED ORDER — MAGNESIUM SULFATE 2 GM/50ML IV SOLN
2.0000 g | Freq: Once | INTRAVENOUS | Status: AC
  Administered 2023-02-07: 2 g via INTRAVENOUS
  Filled 2023-02-07: qty 50

## 2023-02-07 MED ORDER — ONDANSETRON HCL 4 MG/2ML IJ SOLN: 4.0000 mg | Freq: Four times a day (QID) | INTRAMUSCULAR | Status: DC | PRN

## 2023-02-07 MED ORDER — SODIUM CHLORIDE 0.9 % IV SOLN: INTRAVENOUS | Status: DC

## 2023-02-07 MED ORDER — LACTATED RINGERS IV BOLUS
1000.0000 mL | Freq: Once | INTRAVENOUS | Status: AC
  Administered 2023-02-07: 1000 mL via INTRAVENOUS

## 2023-02-07 MED ORDER — ACETAMINOPHEN 325 MG PO TABS: 650.0000 mg | ORAL_TABLET | Freq: Four times a day (QID) | ORAL | Status: DC | PRN

## 2023-02-07 MED ORDER — ACETAMINOPHEN 650 MG RE SUPP: 650.0000 mg | Freq: Four times a day (QID) | RECTAL | Status: DC | PRN

## 2023-02-07 MED ORDER — APIXABAN 5 MG PO TABS
5.0000 mg | ORAL_TABLET | Freq: Two times a day (BID) | ORAL | Status: DC
  Administered 2023-02-08: 5 mg via ORAL
  Filled 2023-02-07: qty 1

## 2023-02-07 MED ORDER — POLYETHYLENE GLYCOL 3350 17 G PO PACK: 17.0000 g | PACK | Freq: Every day | ORAL | Status: DC | PRN

## 2023-02-07 NOTE — Assessment & Plan Note (Signed)
Dizziness likely secondary to dehydration, fall in the parking lot here.  Head CT unremarkable.  Reported right-sided weakness- not apparent on exam, no focal neurologic deficits on exam.  Likely more of generalized weakness.  MRI brain negative for stroke or other acute abnormality. -Ambulate in a.m. after hydration

## 2023-02-07 NOTE — ED Notes (Signed)
EDP at bedside to evaluate pt 

## 2023-02-07 NOTE — Assessment & Plan Note (Signed)
Colon cancer with metastasis to liver.  Status post CT and ultrasound-guided percutaneous microwave thermal ablation of metastatic lesion in the right lobe of the liver 12/27/2022.

## 2023-02-07 NOTE — ED Provider Notes (Signed)
[facility] AT [facility] Provider Note   CSN: [id number] Arrival date & time: 02/07/23  1237     History  Chief Complaint  Patient presents with   Dizziness    Herbert Wood is a 88 y.o. male.   Dizziness 88 year old male history of prior DVT, factor V Leiden deficiency on Eliquis, type 2 diabetes, hypertension, hyperlipidemia, CKD, iron deficiency anemia, colon cancer not on chemotherapy presenting for dizziness.  Patient is with his wife who provides further history.  He states symptoms started on Sunday when initially had some nausea and vomiting and mild generalized abdominal pain.  He saw his GI doctor July 8 he was concern for gastritis so was given sucralfate with improvement in pain.  His pain had resolved by yesterday and nausea had improved.  Last night and today he has been pain-free and eating.  However today he stood up and became lightheaded and dizzy.  He also felt like his legs gave out.  This lasted for about 20 seconds.  He almost fell but did not fall did not lose consciousness.  He did not hit his head.  Has not had any headache or vision changes.  He has no current weakness or numbness or pain at all.  No chest pain or difficulty breathing.  No fevers or chills.  No urinary symptoms.  He has had poor p.o. intake.     Home Medications Prior to Admission medications   Medication Sig Start Date End Date Taking? Authorizing Provider  albuterol (VENTOLIN HFA) 108 (90 Base) MCG/ACT inhaler Inhale into the lungs every 6 (six) hours as needed for wheezing or shortness of breath.    [provider]  amitriptyline (ELAVIL) 10 MG tablet Take 10 mg by mouth every evening.    [provider]  amLODipine (NORVASC) 5 MG tablet Take 5 mg by mouth 2 (two) times daily.    [provider]  ascorbic acid (VITAMIN C/NATURAL ROSE HIPS) 1000 MG tablet Take 1,000 mg by mouth every evening.    [provider]  atorvastatin  (LIPITOR) 40 MG tablet Take 40 mg by mouth every evening.    [provider]  carvedilol (COREG) 6.25 MG tablet Take 6.25 mg by mouth 2 (two) times daily with a meal.    [provider]  Cholecalciferol (VITAMIN D3) 125 MCG (5000 UT) CAPS Take 5,000 Units by mouth every evening.    [provider]  dorzolamide (TRUSOPT) 2 % ophthalmic solution Place 1 drop into the left eye 2 (two) times daily.    [provider]  ELIQUIS 5 MG TABS tablet TAKE 1 TABLET(5 MG) BY MOUTH TWICE DAILY 10/05/22   [name], MD  ferrous sulfate 325 (65 FE) MG tablet Take 325 mg by mouth in the morning and at bedtime. Iron Nature's Bounty    [provider]  folic acid (FOLVITE) 800 MCG tablet Take 800 mcg by mouth every evening. With B12 Now (Brand)    [provider]  hydrALAZINE (APRESOLINE) 25 MG tablet Take 50 mg by mouth in the morning and at bedtime.    [provider]  irbesartan (AVAPRO) 300 MG tablet Take 300 mg by mouth every evening. 10/03/21   [provider]  KERENDIA 10 MG TABS Take 10 mg by mouth daily.    [provider]  latanoprost (XALATAN) 0.005 % ophthalmic solution Place 1 drop into both eyes at bedtime.    [provider]  linagliptin (TRADJENTA)  5 MG TABS tablet Take 5 mg by mouth in the morning.    [provider]  loratadine (CLARITIN) 10 MG tablet Take 10 mg by mouth every morning.    [provider]  LORazepam (ATIVAN) 0.5 MG tablet Take 0.5 mg by mouth 2 (two) times daily. prn    [provider]  Misc Natural Products (PROSTATE SUPPORT PO) Take 1 capsule by mouth every evening. NOW Prostate Support    [provider]  omeprazole (PRILOSEC) 40 MG capsule Take 1 capsule (40 mg total) by mouth daily. 01/10/22   Dolores Frame, MD  Sod Picosulfate-Mag Ox-Cit Acd [facility] 10-3.5-12 MG-GM -GM/175ML SOLN Take 1 kit by mouth as directed. 01/03/23   Dolores Frame, MD  sucralfate (CARAFATE) 1 GM/10ML suspension Take 10 mLs (1 g total) by mouth 4 (four) times daily. 02/05/23   Carlan, Chelsea L, NP  vitamin B-12 (CYANOCOBALAMIN) 1000 MCG tablet Take 1,000 mcg by mouth daily. Now (Brand)    [provider]      Allergies    Gadobenate    Review of Systems   Review of Systems  Neurological:  Positive for dizziness.  Review of systems completed and notable as per HPI.  ROS otherwise negative.   Physical Exam Updated Vital Signs BP (!) 124/52   Pulse (!) 58   Temp 97.7 F (36.5 C) (Oral)   Resp (!) 23   Ht 5\' 10"  (1.778 m)   Wt 95.7 kg   SpO2 95%   BMI 30.28 kg/m  Physical Exam Vitals and nursing note reviewed.  Constitutional:      General: He is not in acute distress.    Appearance: He is well-developed.  HENT:     Head: Normocephalic and atraumatic.     Nose: Nose normal.     Mouth/Throat:     Mouth: Mucous membranes are moist.     Pharynx: Oropharynx is clear.  Eyes:     Extraocular Movements: Extraocular movements intact.     Conjunctiva/sclera: Conjunctivae normal.     Pupils: Pupils are equal, round, and reactive to light.  Cardiovascular:     Rate and Rhythm: Normal rate and regular rhythm.     Heart sounds: No murmur heard. Pulmonary:     Effort: Pulmonary effort is normal. No respiratory distress.     Breath sounds: Normal breath sounds.  Abdominal:     Palpations: Abdomen is soft.     Tenderness: There is no abdominal tenderness. There is no right CVA tenderness, left CVA tenderness, guarding or rebound.  Musculoskeletal:        General: No swelling.     Cervical back: Neck supple. No rigidity or tenderness.     Right lower leg: No edema.     Left lower leg: No edema.  Skin:    General: Skin is warm and dry.     Capillary Refill: Capillary refill takes less than 2 seconds.     Comments: Abrasion to L elbow, normal ROM without pain.  Neurological:     General: No focal deficit present.      Mental Status: He is alert and oriented to person, place, and time. Mental status is at baseline.     Cranial Nerves: No cranial nerve deficit.     Sensory: No sensory deficit.     Motor: No weakness.     Coordination: Coordination normal.     Gait: Gait normal.     Comments: Patient is awake  alert and appropriately oriented.  Cranial nerves II through XII are intact.  He has no clonus bilaterally.  He has full strength and normal sensation of bilateral upper and lower extremities.  Normal finger-to-nose bilaterally.  He is able to stand without dizziness or any other symptoms.  Normal gait.  Psychiatric:        Mood and Affect: Mood normal.     ED Results / Procedures / Treatments   Labs (all labs ordered are listed, but only abnormal results are displayed) Labs Reviewed  CBC - Abnormal; Notable for the following components:      Result Value   WBC 16.3 (*)    RBC 3.70 (*)    Hemoglobin 12.2 (*)    HCT 35.8 (*)    All other components within normal limits  MAGNESIUM - Abnormal; Notable for the following components:   Magnesium 1.5 (*)    All other components within normal limits  PROTIME-INR - Abnormal; Notable for the following components:   Prothrombin Time 21.5 (*)    INR 1.8 (*)    All other components within normal limits  BASIC METABOLIC PANEL - Abnormal; Notable for the following components:   Sodium 132 (*)    Glucose, Bld 107 (*)    BUN 50 (*)    Creatinine, Ser 2.33 (*)    Calcium 8.3 (*)    GFR, Estimated 27 (*)    All other components within normal limits  HEPATIC FUNCTION PANEL - Abnormal; Notable for the following components:   Total Protein 5.8 (*)    Albumin 2.8 (*)    ALT 48 (*)    Total Bilirubin 2.2 (*)    Bilirubin, Direct 0.4 (*)    Indirect Bilirubin 1.8 (*)    All other components within normal limits  TROPONIN I (HIGH SENSITIVITY) - Abnormal; Notable for the following components:   Troponin I (High Sensitivity) 19 (*)    All other components  within normal limits  TROPONIN I (HIGH SENSITIVITY) - Abnormal; Notable for the following components:   Troponin I (High Sensitivity) 19 (*)    All other components within normal limits  PHOSPHORUS  URINALYSIS, ROUTINE W REFLEX MICROSCOPIC  CBG MONITORING, ED    EKG EKG Interpretation Date/Time:  Wednesday February 07 2023 12:46:08 EDT Ventricular Rate:  83 PR Interval:  169 QRS Duration:  108 QT Interval:  403 QTC Calculation: 416 R Axis:   -64  Text Interpretation: Sinus rhythm Supraventricular bigeminy Left anterior fascicular block Anteroseptal infarct, old Confirmed by Vanetta Mulders [phone]) on 02/07/2023 3:59:45 PM  Radiology CT Head Wo Contrast  Result Date: 02/07/2023 CLINICAL DATA:  Dizziness, fall EXAM: CT HEAD WITHOUT CONTRAST TECHNIQUE: Contiguous axial images were obtained from the base of the skull through the vertex without intravenous contrast. RADIATION DOSE REDUCTION: This exam was performed according to the departmental dose-optimization program which includes automated exposure control, adjustment of the mA and/or kV according to patient size and/or use of iterative reconstruction technique. COMPARISON:  None Available. FINDINGS: Brain: No acute intracranial findings are seen. There are no signs of bleeding within the cranium. Cortical sulci are prominent. The decreased density in periventricular and subcortical white matter. Vascular: Scattered arterial calcifications are seen. Skull: No acute findings are seen. Sinuses/Orbits: Mucosal thickening and mucous retention cysts are seen in ethmoid and maxillary sinuses. The defect in right mastoid, possibly suggesting previous right mastoidectomy. Other: None. IMPRESSION: No acute intracranial findings are seen in noncontrast CT brain. Atrophy. Small vessel disease.  Chronic ethmoid and maxillary sinusitis. Electronically Signed   By: Ernie Avena M.D.   On: 02/07/2023 15:31   DG Chest 2 View  Result Date:  02/07/2023 CLINICAL DATA:  Presyncope.  Fall. EXAM: CHEST - 2 VIEW COMPARISON:  AP chest 12/27/2022 FINDINGS: Cardiac silhouette mediastinal contours are within normal limits with mildly to moderately tortuous descending thoracic aorta and high-grade atherosclerotic calcifications within the aortic arch. Unchanged mild elevation of the right hemidiaphragm. Mild right basilar greater than left basilar bronchovascular crowding. No focal airspace opacity to indicate pneumonia. No pleural effusion or pneumothorax. No pulmonary edema. Moderate multilevel degenerative disc changes of the thoracic spine. IMPRESSION: 1. No acute cardiopulmonary process. 2. Unchanged mild elevation of the right hemidiaphragm. 3. Mild right basilar greater than left basilar bronchovascular crowding. Electronically Signed   By: Neita Garnet M.D.   On: 02/07/2023 14:59   DG Elbow Complete Left  Result Date: 02/07/2023 CLINICAL DATA:  Fall.  Left elbow pain. EXAM: LEFT ELBOW - COMPLETE 3+ VIEW COMPARISON:  None Available. FINDINGS: Mildly decreased bone mineralization. Normal position of the distal anterior humeral fat pad without evidence of elbow joint effusion. Minimal radial head-neck junction degenerative spurs. Moderate radiocarpal and mild trochlea-coronoid joint space narrowing. Mild peripheral medial elbow degenerative osteophytosis. A corticated 5 mm ossicle at the common flexor tendon origin at the medial epicondyle, chronic enthesopathic change. No definite acute fracture is seen.  No dislocation. IMPRESSION: 1. No definite acute fracture is seen. 2. Mild-moderate elbow joint arthrosis. 3. Mild chronic enthesopathic change at the common flexor tendon origin. Electronically Signed   By: Neita Garnet M.D.   On: 02/07/2023 14:58    Procedures Procedures    Medications Ordered in ED Medications  lactated ringers bolus 1,000 mL (0 mLs Intravenous Stopped 02/07/23 1501)  magnesium sulfate IVPB 2 g 50 mL (2 g Intravenous New  Bag/Given 02/07/23 1500)    ED Course/ Medical Decision Making/ A&P                              Medical Decision Making Amount and/or Complexity of Data Reviewed Labs: ordered. Radiology: ordered.  Risk Prescription drug management. Decision regarding hospitalization.   Medical Decision Making:   Herbert Wood is a 88 y.o. male who presented to the ED today with episode of dizziness.  Vital signs reviewed notable for mild tachypnea in triage, though on my evaluation is normal work of breathing and no tachypnea.  Patient had some abdominal pain and vomiting earlier this week which has since resolved.  His abdominal exam here is completely benign without pain, low concern for acute surgical pathology at this time.  He did report episode of dizziness which sounded more consistent with presyncope.  He felt like his right leg gave out on him as well.  Here he has normal neurologic exam, no signs of active CVA but did consider possible TIA, intracranial bleeding given he is  Eliquis.  Will obtain CT of the head.  Also consider possible ACS although no chest pain, will screen with EKG, troponin and obtain lab workup.  I suspect he is likely dehydrated from recent decreased p.o. intake and vomiting which is probably contributing to his symptoms of dizziness.   Additional history discussed with patient's family/caregivers.  Patient placed on continuous vitals and telemetry monitoring while in ED which was reviewed periodically.  Reviewed and confirmed nursing documentation for past medical history, family history, social history.  Initial Study Results:   Laboratory  All laboratory results reviewed.  Labs notable for AKI, mild hyponatremia, leukocytosis, mild anemia, hypomagnesemia.  EKG sinus rhythm with bigeminy.  Intervals unremarkable.  No significant ST or T wave abnormalities.  Radiology:  All images reviewed independently.  CT head without acute abnormality.  Chest x-ray reviewed without  pneumonia, pneumothorax, pulmonary edema.  Elbow x-ray without fracture.  Agree with radiology report at this time.    Reassessment and Plan:   On reassessment, patient had another episode of dizziness with standing.  He is feeling somewhat better overall.  His lab work is concerning for AKI likely due to dehydration.  I suspect his overall presentation is related to presyncope and dehydration in setting of gastroenteritis.  I do not see signs of central stroke or infection at this time although urinalysis is pending.  Handoff was given to Dr. Deretha Emory at 3:45 PM with anticipation of admission.   Patient's presentation is most consistent with acute presentation with potential threat to life or bodily function.           Final Clinical Impression(s) / ED Diagnoses Final diagnoses:  AKI (acute kidney injury) (HCC)  Dizziness    Rx / DC Orders ED Discharge Orders     None         Laurence Spates, MD 02/07/23 1606

## 2023-02-07 NOTE — Assessment & Plan Note (Signed)
Resume Eliquis 

## 2023-02-07 NOTE — Assessment & Plan Note (Addendum)
Stable. - Several blood pressure medications on list including Norvasc 5, carvedilol 6.25, hydralazine 25, and irbesartan 300, awaiting med reconciliation. -Hold ARB with AKI

## 2023-02-07 NOTE — Assessment & Plan Note (Signed)
WBC 16.3.  At this time no suggestion of infectious etiology.  Recent vomiting abdominal pain resolved.  UA with rare bacteria, chest x-ray clear.  He is afebrile.  Vitals are stable. - Trend for now

## 2023-02-07 NOTE — ED Notes (Signed)
Urine sample requested. Pt unable to urinate at this time. 

## 2023-02-07 NOTE — Telephone Encounter (Signed)
[place] FYI -  Pt seen in office 2 days ago. His wife Herbert Wood called and left vm states he had a bad day, vomiting, couldn't eat. Today no more stomach pains, he is weak, ate breaksfast this morning and kept it down. Then tried to get up and he is dizzy and weak. She thinks he is dehydrated. She cannot get him to force fluids like he should. She thinks he needs fluids.   I called her to get more information. She states he ate last night and kept that down and slept all night which is usual for him. Could barely walk from den to [employer]. Dizzy having to hold the wall to walk. States today he is having weakness on right side. I advised her that he needs to go to ED right away to evaluate for stoke/and or to get fluids if he is dehydrated.  she states she is taking him to [facility] ED.

## 2023-02-07 NOTE — ED Notes (Signed)
Urine sample requested. Pt unable to urinate at this time.

## 2023-02-07 NOTE — ED Triage Notes (Signed)
Pt via POV with wife and got lightheaded and fell in the parking lot immediately upon arrival, sustaining a skin tear to left elbow. Pt's wife states he started feeling ill with n/v on Sunday and Monday, and then yesterday he was lethargic and shaky and unable to tolerate PO intake. Wife says he has been acting confused and not himself since Tuesday morning. This morning he became very dizzy at home and fell into a wall at home; wife called his GI doctor and was advised to come to ER for eval. Pt admitted en route that he has also had right-sided weakness in addition to dizziness.   Skin tear bandaged with telfa and rolled gauze; pt takes Eliquis. A/O on arrival. Wife says he did not hit his head when he fell in the parking lot.

## 2023-02-07 NOTE — Assessment & Plan Note (Addendum)
Controlled.  A1c 5.9. ? Tight Controlled considering age? - SSI- S - Hold linagliptin

## 2023-02-07 NOTE — ED Notes (Signed)
ED TO INPATIENT HANDOFF REPORT  ED Nurse Name and Phone #: Johnney Killian Name/Age/Gender Herbert Wood 88 y.o. male Room/Bed: APA08/APA08  Code Status   Code Status: Prior  Home/SNF/Other Home Patient oriented to: self, place, time, and situation Is this baseline? Yes   Triage Complete: Triage complete  Chief Complaint Acute kidney injury superimposed on chronic kidney disease (HCC) [N17.9, N18.9]  Triage Note Pt via POV with wife and got lightheaded and fell in the parking lot immediately upon arrival, sustaining a skin tear to left elbow. Pt's wife states he started feeling ill with n/v on Sunday and Monday, and then yesterday he was lethargic and shaky and unable to tolerate PO intake. Wife says he has been acting confused and not himself since Tuesday morning. This morning he became very dizzy at home and fell into a wall at home; wife called his GI doctor and was advised to come to ER for eval. Pt admitted en route that he has also had right-sided weakness in addition to dizziness.   Skin tear bandaged with telfa and rolled gauze; pt takes Eliquis. A/O on arrival. Wife says he did not hit his head when he fell in the parking lot.    Allergies Allergies  Allergen Reactions   Gadobenate Nausea And Vomiting    Immediately upon the infusion of 15mL multihance contrast  Patient had exteme nausea and vomiting.   No other symptoms noted .  No injury.  MRI scan was completed after patient felt better.   Immediately upon the infusion of 15mL multihance contrast  Patient had exteme nausea and vomiting.   No other symptoms noted .  No injury.  MRI scan was completed after patient felt better.       Level of Care/Admitting Diagnosis ED Disposition     ED Disposition  Admit   Condition  --   Comment  Hospital Area: [facility] [[id number]]  Level of Care: Telemetry [5]  Covid Evaluation: Asymptomatic - no recent exposure (last 10 days) testing not required  Diagnosis: Acute  kidney injury superimposed on chronic kidney disease (HCC) [[id number]]  Admitting Physician: Onnie Boer [phone]  Attending Physician: Onnie Boer Xenia.[email or web address]          B Medical/Surgery History Past Medical History:  Diagnosis Date   Adenomatous colon polyp    Anemia    Carotid bruit    Chronic dyspnea    Diabetes (HCC)    type 2   Diverticular disease    DVT (deep venous thrombosis) (HCC)    lower limb   Dyspnea    Factor V deficiency (HCC)    Hearing loss    bilateral   Hyperlipemia    Hypertension    Inguinal hernia    Kidney disease    stage 3   Lung nodule    Lymphadenopathy    Malignant neoplasm (HCC)    salivary gland   Murmur    Osteoarthritis    Senile purpura (HCC)    Past Surgical History:  Procedure Laterality Date   BIOPSY  01/10/2022   Procedure: BIOPSY;  Surgeon: Dolores Frame, MD;  [facility];  Service: Gastroenterology;;   COLONOSCOPY WITH PROPOFOL N/A 01/10/2022   Procedure: COLONOSCOPY WITH PROPOFOL;  Surgeon: Dolores Frame, MD;  [facility];  Service: Gastroenterology;  Laterality: N/A;  per Soledad Gerlach, pt to arrive at 9:15   ESOPHAGOGASTRODUODENOSCOPY (EGD) WITH PROPOFOL N/A 01/10/2022   Procedure: ESOPHAGOGASTRODUODENOSCOPY (EGD)  WITH PROPOFOL;  Surgeon: Marguerita Merles, Reuel Boom, MD;  [facility];  Service: Gastroenterology;  Laterality: N/A;   HAND / FINGER LESION EXCISION     HERNIA REPAIR     MASTOIDECTOMY     PILONIDAL CYST EXCISION     POLYPECTOMY  01/10/2022   Procedure: POLYPECTOMY;  Surgeon: Dolores Frame, MD;  [facility];  Service: Gastroenterology;;   RADIOLOGY WITH ANESTHESIA N/A 12/27/2022   Procedure: CT microwave ablation of the liver;  Surgeon: Irish Lack, MD;  Location: [facility];  Service: Radiology;  Laterality: N/A;   SUBMUCOSAL TATTOO INJECTION  01/10/2022   Procedure: SUBMUCOSAL TATTOO INJECTION;  Surgeon: Dolores Frame, MD;  [facility];  Service: Gastroenterology;;   SURGERY OF LIP     skin cancer     A IV Location/Drains/Wounds Patient Lines/Drains/Airways Status     Active Line/Drains/Airways     Name Placement date Placement time Site Days   Peripheral IV 02/07/23 20 G Right Antecubital 02/07/23  1327  Antecubital  less than 1   Incision - 5 Ports Abdomen 1: Upper 2: Right;Upper 3: Right;Lateral 4: Right 5: Right;Lower 02/17/22  1145  -- 355            Intake/Output Last 24 hours  Intake/Output Summary (Last 24 hours) at 02/07/2023 1943 Last data filed at 02/07/2023 1626 Gross per 24 hour  Intake 1050 ml  Output --  Net 1050 ml    Labs/Imaging Results for orders placed or performed during the hospital encounter of 02/07/23 (from the past 48 hour(s))  Troponin I (High Sensitivity)     Status: Abnormal   Collection Time: 02/07/23  1:10 PM  Result Value Ref Range   Troponin I (High Sensitivity) 19 (H) <18 ng/L    Comment: (NOTE) Elevated high sensitivity troponin I (hsTnI) values and significant  changes across serial measurements may suggest ACS but many other  chronic and acute conditions are known to elevate hsTnI results.  Refer to the "Links" section for chest pain algorithms and additional  guidance. Performed at [facility], [address], [place], [place] [address]   Magnesium     Status: Abnormal   Collection Time: 02/07/23  1:10 PM  Result Value Ref Range   Magnesium 1.5 (L) 1.7 - 2.4 mg/dL    Comment: Performed at [facility], [address] [place], [place] [address]  CBC     Status: Abnormal   Collection Time: 02/07/23  1:37 PM  Result Value Ref Range   WBC 16.3 (H) 4.0 - 10.5 K/uL   RBC 3.70 (L) 4.22 - 5.81 MIL/uL   Hemoglobin 12.2 (L) 13.0 - 17.0 g/dL   HCT 09.8 (L) 11.9 - 14.7 %   MCV 96.8 80.0 - 100.0 fL   MCH 33.0 26.0 - 34.0 pg   MCHC 34.1 30.0 - 36.0 g/dL   RDW 82.9 56.2 - 13.0 %   Platelets 150 150 - 400 K/uL   nRBC 0.0  0.0 - 0.2 %    Comment: Performed at [facility], [address] [place], [place] [address]  Protime-INR     Status: Abnormal   Collection Time: 02/07/23  1:52 PM  Result Value Ref Range   Prothrombin Time 21.5 (H) 11.4 - 15.2 seconds   INR 1.8 (H) 0.8 - 1.2    Comment: (NOTE) INR goal varies based on device and disease states. Performed at [facility], [address], [place], [place] [address]  Basic metabolic panel     Status: Abnormal   Collection Time: 02/07/23  2:28 PM  Result Value Ref Range   Sodium 132 (L) 135 - 145 mmol/L   Potassium 3.9 3.5 - 5.1 mmol/L   Chloride 100 98 - 111 mmol/L   CO2 22 22 - 32 mmol/L   Glucose, Bld 107 (H) 70 - 99 mg/dL    Comment: Glucose reference range applies only to samples taken after fasting for at least 8 hours.   BUN 50 (H) 8 - 23 mg/dL   Creatinine, Ser 1.61 (H) 0.61 - 1.24 mg/dL   Calcium 8.3 (L) 8.9 - 10.3 mg/dL   GFR, Estimated 27 (L) >60 mL/min    Comment: (NOTE) Calculated using the CKD-EPI Creatinine Equation (2021)    Anion gap 10 5 - 15    Comment: Performed at [facility], [address], [place], [place] [address]  Hepatic function panel     Status: Abnormal   Collection Time: 02/07/23  2:28 PM  Result Value Ref Range   Total Protein 5.8 (L) 6.5 - 8.1 g/dL   Albumin 2.8 (L) 3.5 - 5.0 g/dL   AST 25 15 - 41 U/L   ALT 48 (H) 0 - 44 U/L   Alkaline Phosphatase 87 38 - 126 U/L   Total Bilirubin 2.2 (H) 0.3 - 1.2 mg/dL   Bilirubin, Direct 0.4 (H) 0.0 - 0.2 mg/dL   Indirect Bilirubin 1.8 (H) 0.3 - 0.9 mg/dL    Comment: Performed at [facility], [address] [place], [place] [address]  Troponin I (High Sensitivity)     Status: Abnormal   Collection Time: 02/07/23  2:28 PM  Result Value Ref Range   Troponin I (High Sensitivity) 19 (H) <18 ng/L    Comment: (NOTE) Elevated high sensitivity troponin I (hsTnI) values and significant  changes across serial measurements may suggest ACS but many other  chronic and acute  conditions are known to elevate hsTnI results.  Refer to the "Links" section for chest pain algorithms and additional  guidance. Performed at [facility], [address], [place], [place] [address]   Phosphorus     Status: None   Collection Time: 02/07/23  2:28 PM  Result Value Ref Range   Phosphorus 3.2 2.5 - 4.6 mg/dL    Comment: Performed at [facility], [address], [place], [place] [address]  CBG monitoring, ED     Status: None   Collection Time: 02/07/23  3:05 PM  Result Value Ref Range   Glucose-Capillary 99 70 - 99 mg/dL    Comment: Glucose reference range applies only to samples taken after fasting for at least 8 hours.  Urinalysis, Routine w reflex microscopic -Urine, Clean Catch     Status: Abnormal   Collection Time: 02/07/23  4:24 PM  Result Value Ref Range   Color, Urine AMBER (A) YELLOW    Comment: BIOCHEMICALS MAY BE AFFECTED BY COLOR   APPearance CLOUDY (A) CLEAR   Specific Gravity, Urine 1.013 1.005 - 1.030   pH 5.0 5.0 - 8.0   Glucose, UA NEGATIVE NEGATIVE mg/dL   Hgb urine dipstick NEGATIVE NEGATIVE   Bilirubin Urine NEGATIVE NEGATIVE   Ketones, ur NEGATIVE NEGATIVE mg/dL   Protein, ur >=621 (A) NEGATIVE mg/dL   Nitrite NEGATIVE NEGATIVE   Leukocytes,Ua NEGATIVE NEGATIVE   RBC / HPF 0-5 0 - 5 RBC/hpf   WBC, UA 6-10 0 - 5 WBC/hpf   Bacteria, UA RARE (A) NONE SEEN  Squamous Epithelial / HPF 0-5 0 - 5 /HPF   Mucus PRESENT    Hyaline Casts, UA PRESENT     Comment: Performed at [facility], [address], [place], [place] [address]   MR Brain Wo Contrast (neuro protocol)  Result Date: 02/07/2023 CLINICAL DATA:  Neuro deficit, acute, stroke suspected EXAM: MRI HEAD WITHOUT CONTRAST TECHNIQUE: Multiplanar, multiecho pulse sequences of the brain and surrounding structures were obtained without intravenous contrast. COMPARISON:  Same day CT head. FINDINGS: Brain: No acute infarction, hemorrhage, hydrocephalus, extra-axial collection or mass lesion. Vascular:  Major arterial flow voids are maintained at the skull base. Skull and upper cervical spine: Normal marrow signal. Sinuses/Orbits: Mucosal thickening in the inferior maxillary sinuses with left maxillary sinus retention cyst versus polyp. Otherwise, clear sinuses. No acute orbital findings. Other: No mastoid effusions. IMPRESSION: No evidence of acute intracranial abnormality. Electronically Signed   By: Feliberto Harts M.D.   On: 02/07/2023 17:59   CT Head Wo Contrast  Result Date: 02/07/2023 CLINICAL DATA:  Dizziness, fall EXAM: CT HEAD WITHOUT CONTRAST TECHNIQUE: Contiguous axial images were obtained from the base of the skull through the vertex without intravenous contrast. RADIATION DOSE REDUCTION: This exam was performed according to the departmental dose-optimization program which includes automated exposure control, adjustment of the mA and/or kV according to patient size and/or use of iterative reconstruction technique. COMPARISON:  None Available. FINDINGS: Brain: No acute intracranial findings are seen. There are no signs of bleeding within the cranium. Cortical sulci are prominent. The decreased density in periventricular and subcortical white matter. Vascular: Scattered arterial calcifications are seen. Skull: No acute findings are seen. Sinuses/Orbits: Mucosal thickening and mucous retention cysts are seen in ethmoid and maxillary sinuses. The defect in right mastoid, possibly suggesting previous right mastoidectomy. Other: None. IMPRESSION: No acute intracranial findings are seen in noncontrast CT brain. Atrophy. Small vessel disease. Chronic ethmoid and maxillary sinusitis. Electronically Signed   By: Ernie Avena M.D.   On: 02/07/2023 15:31   DG Chest 2 View  Result Date: 02/07/2023 CLINICAL DATA:  Presyncope.  Fall. EXAM: CHEST - 2 VIEW COMPARISON:  [facility] chest 12/27/2022 FINDINGS: Cardiac silhouette mediastinal contours are within normal limits with mildly to moderately tortuous  descending thoracic aorta and high-grade atherosclerotic calcifications within the aortic arch. Unchanged mild elevation of the right hemidiaphragm. Mild right basilar greater than left basilar bronchovascular crowding. No focal airspace opacity to indicate pneumonia. No pleural effusion or pneumothorax. No pulmonary edema. Moderate multilevel degenerative disc changes of the thoracic spine. IMPRESSION: 1. No acute cardiopulmonary process. 2. Unchanged mild elevation of the right hemidiaphragm. 3. Mild right basilar greater than left basilar bronchovascular crowding. Electronically Signed   By: Neita Garnet M.D.   On: 02/07/2023 14:59   DG Elbow Complete Left  Result Date: 02/07/2023 CLINICAL DATA:  Fall.  Left elbow pain. EXAM: LEFT ELBOW - COMPLETE 3+ VIEW COMPARISON:  None Available. FINDINGS: Mildly decreased bone mineralization. Normal position of the distal anterior humeral fat pad without evidence of elbow joint effusion. Minimal radial head-neck junction degenerative spurs. Moderate radiocarpal and mild trochlea-coronoid joint space narrowing. Mild peripheral medial elbow degenerative osteophytosis. A corticated 5 mm ossicle at the common flexor tendon origin at the medial epicondyle, chronic enthesopathic change. No definite acute fracture is seen.  No dislocation. IMPRESSION: 1. No definite acute fracture is seen. 2. Mild-moderate elbow joint arthrosis. 3. Mild chronic enthesopathic change at the common flexor tendon origin. Electronically Signed   By: Kerin Salen.D.  On: 02/07/2023 14:58    Pending Labs Unresulted Labs (From admission, onward)    None       Vitals/Pain Today's Vitals   02/07/23 1700 02/07/23 1849 02/07/23 1900 02/07/23 1915  BP: (!) 136/56  (!) 156/65 (!) 156/62  Pulse: (!) 59  60 (!) 57  Resp: (!) 24  20 19   Temp:  98.2 F (36.8 C)    TempSrc:  Oral    SpO2: 95%  95% 95%  Weight:      Height:      PainSc:        Isolation Precautions No active  isolations  Medications Medications  0.9 %  sodium chloride infusion ( Intravenous New Bag/Given 02/07/23 1625)  lactated ringers bolus 1,000 mL (0 mLs Intravenous Stopped 02/07/23 1501)  magnesium sulfate IVPB 2 g 50 mL (0 g Intravenous Stopped 02/07/23 1626)    Mobility walks with person assist     Focused Assessments Cardiac Assessment Handoff:    No results found for: "CKTOTAL", "CKMB", "CKMBINDEX", "TROPONINI" No results found for: "DDIMER" Does the Patient currently have chest pain? No    R Recommendations: See Admitting Provider Note  Report given to:   Additional Notes:  ambulatory with assist

## 2023-02-07 NOTE — H&P (Signed)
History and Physical    Herbert Wood ZOX:[id number] DOB: [birth year] DOA: 02/07/2023  PCP: [name], MD   Patient coming from: Home   I have personally briefly reviewed patient's old medical records in [facility]  Chief Complaint: Weakness, Dizziness  HPI: Herbert Wood is a 88 y.o. 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  Patient presented to the ED with complaints of generalized weakness, dizziness, vomiting and abdominal pain.  At the time of my evaluation, patient is sleeping but arousable answers questions appropriately.  Spouse not presently at bedside reported that patient was confused 2 days ago.  Vomiting and abdominal pain has resolved today.  Spouse was concerned about dehydration.  Patient also reported right-sided weakness earlier today, but he might just have been generally weak.  Patient was holding onto the walls to walk at home due to dizziness.  Patient fell here in the parking lot.  Denies hitting his head.  ED Course: Temperature 98.2.  Heart rate 50s to 80.  Respiratory rate 17-29.  Blood pressure systolic 116-1 50s.  O2 sats greater than 94% on room air.  WBC 16.3.  Troponin 19X 2.  Magnesium 1.5.  Phosphorus 3.2.  UA with rare bacteria.  Chest x-ray, head CT, elbow x-ray all negative for acute abnormality.  Patient had MRI brain also unremarkable. Cr elevated 2.33.  WBC 16.3. 1 L bolus given.  Review of Systems: As per HPI all other systems reviewed and negative.  Past Medical History:  Diagnosis Date   Adenomatous colon polyp    Anemia    Carotid bruit    Chronic dyspnea    Diabetes (HCC)    type 2   Diverticular disease    DVT (deep venous thrombosis) (HCC)    lower limb   Dyspnea    Factor V deficiency (HCC)    Hearing loss    bilateral   Hyperlipemia    Hypertension    Inguinal hernia    Kidney disease    stage 3    Lung nodule    Lymphadenopathy    Malignant neoplasm (HCC)    salivary gland   Murmur    Osteoarthritis    Senile purpura (HCC)     Past Surgical History:  Procedure Laterality Date   BIOPSY  01/10/2022   Procedure: BIOPSY;  Surgeon: Dolores Frame, MD;  [facility]: [facility];  Service: Gastroenterology;;   COLONOSCOPY WITH PROPOFOL N/A 01/10/2022   Procedure: COLONOSCOPY WITH PROPOFOL;  Surgeon: Dolores Frame, MD;  Location: [facility];  Service: Gastroenterology;  Laterality: N/A;  per Soledad Gerlach, pt to arrive at 9:15   ESOPHAGOGASTRODUODENOSCOPY (EGD) WITH PROPOFOL N/A 01/10/2022   Procedure: ESOPHAGOGASTRODUODENOSCOPY (EGD) WITH PROPOFOL;  Surgeon: Dolores Frame, MD;  Location: [facility];  Service: Gastroenterology;  Laterality: N/A;   HAND / FINGER LESION EXCISION     HERNIA REPAIR     MASTOIDECTOMY     PILONIDAL CYST EXCISION     POLYPECTOMY  01/10/2022   Procedure: POLYPECTOMY;  Surgeon: Dolores Frame, MD;  Location: [facility];  Service: Gastroenterology;;   RADIOLOGY WITH ANESTHESIA N/A 12/27/2022   Procedure: CT microwave ablation of the liver;  Surgeon: Irish Lack, MD;  Location: [facility];  Service: Radiology;  Laterality: N/A;   SUBMUCOSAL TATTOO INJECTION  01/10/2022   Procedure: SUBMUCOSAL TATTOO INJECTION;  Surgeon: Dolores Frame, MD;  Location: [facility];  Service: Gastroenterology;;   SURGERY OF LIP     skin cancer     reports that he quit smoking about 29 years ago. His smoking use included cigarettes. He has a 50.00 pack-year smoking history. He has been exposed to tobacco smoke. He has never used smokeless tobacco. He reports that he does not drink alcohol and does not use drugs.  Allergies  Allergen Reactions   Gadobenate Nausea And Vomiting    Immediately upon the infusion of 15mL multihance contrast  Patient had exteme nausea and vomiting.   No other symptoms noted .  No injury.   MRI scan was completed after patient felt better.   Immediately upon the infusion of 15mL multihance contrast  Patient had exteme nausea and vomiting.   No other symptoms noted .  No injury.  MRI scan was completed after patient felt better.       Family History of hypertension.  Prior to Admission medications   Medication Sig Start Date End Date Taking? Authorizing Provider  albuterol (VENTOLIN HFA) 108 (90 Base) MCG/ACT inhaler Inhale into the lungs every 6 (six) hours as needed for wheezing or shortness of breath.    [provider]  amitriptyline (ELAVIL) 10 MG tablet Take 10 mg by mouth every evening.    [provider]  amLODipine (NORVASC) 5 MG tablet Take 5 mg by mouth 2 (two) times daily.    [provider]  ascorbic acid (VITAMIN C/NATURAL ROSE HIPS) 1000 MG tablet Take 1,000 mg by mouth every evening.    [provider]  atorvastatin (LIPITOR) 40 MG tablet Take 40 mg by mouth every evening.    [provider]  carvedilol (COREG) 6.25 MG tablet Take 6.25 mg by mouth 2 (two) times daily with a meal.    [provider]  Cholecalciferol (VITAMIN D3) 125 MCG (5000 UT) CAPS Take 5,000 Units by mouth every evening.    [provider]  dorzolamide (TRUSOPT) 2 % ophthalmic solution Place 1 drop into the left eye 2 (two) times daily.    [provider]  ELIQUIS 5 MG TABS tablet TAKE 1 TABLET(5 MG) BY MOUTH TWICE DAILY 10/05/22   [name], MD  ferrous sulfate 325 (65 FE) MG tablet Take 325 mg by mouth in the morning and at bedtime. Iron Nature's Bounty    [provider]  folic acid (FOLVITE) 800 MCG tablet Take 800 mcg by mouth every evening. With B12 Now (Brand)    [provider]  hydrALAZINE (APRESOLINE) 25 MG tablet Take 50 mg by mouth in the morning and at bedtime.    [provider]  irbesartan (AVAPRO) 300 MG tablet Take 300 mg by mouth every evening. 10/03/21   [provider]  KERENDIA 10 MG TABS Take 10 mg by mouth daily.    [provider]  latanoprost (XALATAN) 0.005 % ophthalmic solution Place 1 drop into both eyes at bedtime.    [provider]  linagliptin (TRADJENTA) 5 MG TABS tablet Take 5 mg by mouth in the morning.    [provider]  loratadine (CLARITIN) 10 MG tablet Take 10 mg by mouth every morning.    [provider]  LORazepam (ATIVAN) 0.5 MG tablet Take 0.5 mg by mouth 2 (two) times daily. prn    [provider]  Misc Natural Products (PROSTATE SUPPORT PO) Take 1 capsule by mouth every evening. NOW Prostate Support    [provider]  omeprazole (PRILOSEC) 40 MG capsule Take 1 capsule (40 mg total) by mouth daily. 01/10/22   Dolores Frame, MD  Sod Picosulfate-Mag Ox-Cit Acd [facility]) 10-3.5-12 MG-GM -GM/175ML SOLN Take 1 kit by mouth as directed. 01/03/23   Dolores Frame, MD  sucralfate (CARAFATE) 1 GM/10ML suspension Take 10 mLs (1 g total) by mouth 4 (four) times daily. 02/05/23   Carlan, Chelsea L, NP  vitamin B-12 (CYANOCOBALAMIN) 1000 MCG tablet Take 1,000 mcg by mouth daily. Now Chief Operating Officer)    [provider]    Physical Exam: Vitals:   02/07/23 1700 02/07/23 1849 02/07/23 1900 02/07/23 1915  BP: (!) 136/56  (!) 156/65 (!) 156/62  Pulse: (!) 59  60 (!) 57  Resp: (!) 24  20 19   Temp:  98.2 F (36.8 C)    TempSrc:  Oral    SpO2: 95%  95% 95%  Weight:      Height:        Constitutional: NAD, calm, comfortable Vitals:   02/07/23 1700 02/07/23 1849 02/07/23 1900 02/07/23 1915  BP: (!) 136/56  (!) 156/65 (!) 156/62  Pulse: (!) 59  60 (!) 57  Resp: (!) 24  20 19   Temp:  98.2 F (36.8 C)    TempSrc:  Oral    SpO2: 95%  95% 95%  Weight:      Height:       Eyes: PERRL, lids and conjunctivae normal ENMT: Mucous membranes are moist.   Neck: normal, supple, no masses, no thyromegaly Respiratory: clear to auscultation bilaterally, no wheezing,  no crackles. Normal respiratory effort. No accessory muscle use.  Cardiovascular: Regular rate and rhythm, no murmurs / rubs / gallops. No extremity edema.  Extremities warm Abdomen: no tenderness, no masses palpated. No hepatosplenomegaly. Bowel sounds positive.  Musculoskeletal: no clubbing / cyanosis. No joint deformity upper and lower extremities.  Skin: no rashes, lesions, ulcers. No induration Neurologic: 4+/5 strength in all extremities, speech fluent without aphasia, no facial asymmetry.  Hard of hearing wearing hearing aids Psychiatric: Normal judgment and insight. Alert and oriented x 3. Normal mood.   Labs on Admission: I have personally reviewed following labs and imaging studies  CBC: Recent Labs  Lab 02/07/23 1337  WBC 16.3*  HGB 12.2*  HCT 35.8*  MCV 96.8  PLT 150   Basic Metabolic Panel: Recent Labs  Lab 02/07/23 1310 02/07/23 1428  NA  --  132*  K  --  3.9  CL  --  100  CO2  --  22  GLUCOSE  --  107*  BUN  --  50*  CREATININE  --  2.33*  CALCIUM  --  8.3*  MG 1.5*  --   PHOS  --  3.2   GFR: Estimated Creatinine Clearance: 26.4 mL/min (A) (by C-G formula based on SCr of 2.33 mg/dL (H)). Liver Function Tests: Recent Labs  Lab 02/07/23 1428  AST 25  ALT 48*  ALKPHOS 87  BILITOT 2.2*  PROT 5.8*  ALBUMIN 2.8*   Coagulation Profile: Recent Labs  Lab 02/07/23 1352  INR 1.8*   CBG: Recent Labs  Lab 02/07/23 1505  GLUCAP 99   Urine analysis:    Component Value Date/Time   COLORURINE AMBER (A) 02/07/2023 1624   APPEARANCEUR CLOUDY (A) 02/07/2023 1624   LABSPEC 1.013 02/07/2023 1624   PHURINE 5.0 02/07/2023 1624   GLUCOSEU NEGATIVE 02/07/2023 1624   HGBUR NEGATIVE 02/07/2023 1624   BILIRUBINUR NEGATIVE 02/07/2023 1624   KETONESUR  NEGATIVE 02/07/2023 1624   PROTEINUR >=300 (A) 02/07/2023 1624   NITRITE NEGATIVE 02/07/2023 1624   LEUKOCYTESUR NEGATIVE 02/07/2023 1624    Radiological Exams on Admission: MR Brain Wo Contrast (neuro  protocol)  Result Date: 02/07/2023 CLINICAL DATA:  Neuro deficit, acute, stroke suspected EXAM: MRI HEAD WITHOUT CONTRAST TECHNIQUE: Multiplanar, multiecho pulse sequences of the brain and surrounding structures were obtained without intravenous contrast. COMPARISON:  Same day CT head. FINDINGS: Brain: No acute infarction, hemorrhage, hydrocephalus, extra-axial collection or mass lesion. Vascular: Major arterial flow voids are maintained at the skull base. Skull and upper cervical spine: Normal marrow signal. Sinuses/Orbits: Mucosal thickening in the inferior maxillary sinuses with left maxillary sinus retention cyst versus polyp. Otherwise, clear sinuses. No acute orbital findings. Other: No mastoid effusions. IMPRESSION: No evidence of acute intracranial abnormality. Electronically Signed   By: Feliberto Harts M.D.   On: 02/07/2023 17:59   CT Head Wo Contrast  Result Date: 02/07/2023 CLINICAL DATA:  Dizziness, fall EXAM: CT HEAD WITHOUT CONTRAST TECHNIQUE: Contiguous axial images were obtained from the base of the skull through the vertex without intravenous contrast. RADIATION DOSE REDUCTION: This exam was performed according to the departmental dose-optimization program which includes automated exposure control, adjustment of the mA and/or kV according to patient size and/or use of iterative reconstruction technique. COMPARISON:  None Available. FINDINGS: Brain: No acute intracranial findings are seen. There are no signs of bleeding within the cranium. Cortical sulci are prominent. The decreased density in periventricular and subcortical white matter. Vascular: Scattered arterial calcifications are seen. Skull: No acute findings are seen. Sinuses/Orbits: Mucosal thickening and mucous retention cysts are seen in ethmoid and maxillary sinuses. The defect in right mastoid, possibly suggesting previous right mastoidectomy. Other: None. IMPRESSION: No acute intracranial findings are seen in noncontrast CT  brain. Atrophy. Small vessel disease. Chronic ethmoid and maxillary sinusitis. Electronically Signed   By: Ernie Avena M.D.   On: 02/07/2023 15:31   DG Chest 2 View  Result Date: 02/07/2023 CLINICAL DATA:  Presyncope.  Fall. EXAM: CHEST - 2 VIEW COMPARISON:  [facility] chest 12/27/2022 FINDINGS: Cardiac silhouette mediastinal contours are within normal limits with mildly to moderately tortuous descending thoracic aorta and high-grade atherosclerotic calcifications within the aortic arch. Unchanged mild elevation of the right hemidiaphragm. Mild right basilar greater than left basilar bronchovascular crowding. No focal airspace opacity to indicate pneumonia. No pleural effusion or pneumothorax. No pulmonary edema. Moderate multilevel degenerative disc changes of the thoracic spine. IMPRESSION: 1. No acute cardiopulmonary process. 2. Unchanged mild elevation of the right hemidiaphragm. 3. Mild right basilar greater than left basilar bronchovascular crowding. Electronically Signed   By: Neita Garnet M.D.   On: 02/07/2023 14:59   DG Elbow Complete Left  Result Date: 02/07/2023 CLINICAL DATA:  Fall.  Left elbow pain. EXAM: LEFT ELBOW - COMPLETE 3+ VIEW COMPARISON:  None Available. FINDINGS: Mildly decreased bone mineralization. Normal position of the distal anterior humeral fat pad without evidence of elbow joint effusion. Minimal radial head-neck junction degenerative spurs. Moderate radiocarpal and mild trochlea-coronoid joint space narrowing. Mild peripheral medial elbow degenerative osteophytosis. A corticated 5 mm ossicle at the common flexor tendon origin at the medial epicondyle, chronic enthesopathic change. No definite acute fracture is seen.  No dislocation. IMPRESSION: 1. No definite acute fracture is seen. 2. Mild-moderate elbow joint arthrosis. 3. Mild chronic enthesopathic change at the common flexor tendon origin. Electronically Signed   By: Neita Garnet M.D.   On: 02/07/2023 14:58    EKG:  Independently reviewed.  Rate 83, QTc 416.  LAFB.  Supraventricular bigeminy.  Otherwise no significant ST or T wave changes from prior EKG.  Assessment/Plan Principal Problem:   Acute kidney injury superimposed on chronic kidney disease (HCC) Active Problems:   Leukocytosis   Fall   DM (diabetes mellitus), type 2 (HCC)   HTN (hypertension)   History of DVT (deep vein thrombosis)   Cancer of sigmoid colon (HCC)   Assessment and Plan: * Acute kidney injury superimposed on chronic kidney disease (HCC) Creatinine 2.3, baseline 1.2-1.5, CKD 3A.  Likely prerenal from poor oral intake, vomiting.  He is on ARBs. Vomiting and abdominal pain have resolved. -1 L bolus given, continue N/s 75cc/hr x 1 day -Hold irbesartan  Fall Dizziness likely secondary to dehydration, fall in the parking lot here.  Head CT unremarkable.  Reported right-sided weakness- not apparent on exam, no focal neurologic deficits on exam.  Likely more of generalized weakness.  MRI brain negative for stroke or other acute abnormality. -Ambulate in a.m. after hydration  Leukocytosis WBC 16.3.  At this time no suggestion of infectious etiology.  Recent vomiting abdominal pain resolved.  UA with rare bacteria, chest x-ray clear.  He is afebrile.  Vitals are stable. - Trend for now  DM (diabetes mellitus), type 2 (HCC) Controlled.  A1c 5.9. ? Tight Controlled considering age? - SSI- S - Hold linagliptin  HTN (hypertension) Stable. - Several blood pressure medications on list including Norvasc 5, carvedilol 6.25, hydralazine 25, and irbesartan 300, awaiting med reconciliation. -Hold ARB with AKI  Cancer of sigmoid colon (HCC) Colon cancer with metastasis to liver.  Status post CT and ultrasound-guided percutaneous microwave thermal ablation of metastatic lesion in the right lobe of the liver 12/27/2022.  History of DVT (deep vein thrombosis) Resume Eliquis   DVT prophylaxis: Eliquis Code Status: FULL Code-hard of  hearing, does not appear to fully understand my question. Assume full code for now. Family Communication: None At bedside Disposition Plan: ~ 1 daY Consults called: None  Admission status: Obs tele   Author: Onnie Boer, MD 02/07/2023 11:45 PM  For on call review [email or web address].

## 2023-02-07 NOTE — ED Notes (Signed)
Urine sample requested. Pt unable to urinate.

## 2023-02-07 NOTE — Assessment & Plan Note (Signed)
Creatinine 2.3, baseline 1.2-1.5, CKD 3A.  Likely prerenal from poor oral intake, vomiting.  He is on ARBs. Vomiting and abdominal pain have resolved. -1 L bolus given, continue N/s 75cc/hr x 1 day -Hold irbesartan

## 2023-02-08 DIAGNOSIS — E1129 Type 2 diabetes mellitus with other diabetic kidney complication: Secondary | ICD-10-CM | POA: Diagnosis not present

## 2023-02-08 DIAGNOSIS — I10 Essential (primary) hypertension: Secondary | ICD-10-CM

## 2023-02-08 DIAGNOSIS — N179 Acute kidney failure, unspecified: Secondary | ICD-10-CM | POA: Diagnosis not present

## 2023-02-08 DIAGNOSIS — Z86718 Personal history of other venous thrombosis and embolism: Secondary | ICD-10-CM

## 2023-02-08 DIAGNOSIS — C187 Malignant neoplasm of sigmoid colon: Secondary | ICD-10-CM

## 2023-02-08 LAB — CBC
HCT: 30.3 % — ABNORMAL LOW (ref 39.0–52.0)
Hemoglobin: 10.4 g/dL — ABNORMAL LOW (ref 13.0–17.0)
MCH: 33.2 pg (ref 26.0–34.0)
MCHC: 34.3 g/dL (ref 30.0–36.0)
MCV: 96.8 fL (ref 80.0–100.0)
Platelets: 135 10*3/uL — ABNORMAL LOW (ref 150–400)
RBC: 3.13 MIL/uL — ABNORMAL LOW (ref 4.22–5.81)
RDW: 12.5 % (ref 11.5–15.5)
WBC: 9.7 10*3/uL (ref 4.0–10.5)
nRBC: 0 % (ref 0.0–0.2)

## 2023-02-08 LAB — BASIC METABOLIC PANEL
Anion gap: 8 (ref 5–15)
BUN: 51 mg/dL — ABNORMAL HIGH (ref 8–23)
CO2: 21 mmol/L — ABNORMAL LOW (ref 22–32)
Calcium: 8.2 mg/dL — ABNORMAL LOW (ref 8.9–10.3)
Chloride: 104 mmol/L (ref 98–111)
Creatinine, Ser: 1.92 mg/dL — ABNORMAL HIGH (ref 0.61–1.24)
GFR, Estimated: 34 mL/min — ABNORMAL LOW (ref >60)
Glucose, Bld: 87 mg/dL (ref 70–99)
Potassium: 3.2 mmol/L — ABNORMAL LOW (ref 3.5–5.1)
Sodium: 133 mmol/L — ABNORMAL LOW (ref 135–145)

## 2023-02-08 LAB — GLUCOSE, CAPILLARY: Glucose-Capillary: 101 mg/dL — ABNORMAL HIGH (ref 70–99)

## 2023-02-08 NOTE — Hospital Course (Addendum)
Herbert Wood is a 88 y.o. 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   Spouse reported that patient was confused 2 days ago and is concerned about dehydration. Patient was holding onto the walls to walk at home due to dizziness, and possible question of R sided weakness (not on exam).    On arrival pt was afebrile but with increased respiratory rate 17-29, sats >94% on RA. WBC 16.3k.  Troponin 19X 2. UA with rare bacteria.  Chest x-ray, head CT, elbow x-ray, and MRI Brain all negative for acute abnormality. Cr elevated 2.33 (baseline 1.2-1.5) and pt received 1L bolus and mIVF.   This morning, 7/11, day of discharge, Cr improved to 1.92, WBCs normalized to 9.7k, and he has been able to ambulate safely multiple times.

## 2023-02-08 NOTE — Discharge Summary (Signed)
Physician Discharge Summary  Herbert Wood ZOX:[id number] DOB: 03/14/1935 DOA: 02/07/2023  PCP: [name], MD  Admit date: 02/07/2023  Discharge date: 02/08/2023  Admitted From: Home  Disposition:  Home  Recommendations for Outpatient Follow-up:  Follow up with PCP in 1 week Please obtain BMP/CBC in one week Please follow up on the following pending results: None at this time Please have your PCP check your kidney function before resuming Irbesartan 300mg  daily. Continue to drink plenty of fluids after returning home  Home Health: None  Equipment/Devices: None  Discharge Condition:Stable  CODE STATUS: Full  Diet recommendation: Heart Healthy  Brief/Interim Summary: Herbert Wood is a 88 y.o. 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   Spouse reported that patient was confused 2 days ago and is concerned about dehydration. Patient was holding onto the walls to walk at home due to dizziness, and possible question of R sided weakness (not on exam).    On arrival pt was afebrile but with increased respiratory rate 17-29, sats >94% on RA. WBC 16.3k.  Troponin 19X 2. UA with rare bacteria.  Chest x-ray, head CT, elbow x-ray, and MRI Brain all negative for acute abnormality. Cr elevated 2.33 (baseline 1.2-1.5) and pt received 1L bolus and mIVF.   This morning, 7/11, day of discharge, Cr improved to 1.92, WBCs normalized to 9.7k, and he has been able to ambulate safely multiple times.   Discharge Diagnoses:  Principal Problem:   Acute kidney injury superimposed on chronic kidney disease (HCC) Active Problems:   HTN  (hypertension)   Leukocytosis   History of DVT (deep vein thrombosis)   DM (diabetes mellitus), type 2 (HCC)   Cancer of sigmoid colon [facility])   Fall    Discharge Instructions  Discharge Instructions     Diet - low sodium heart healthy   Complete by: As directed    Increase activity slowly   Complete by: As directed       Allergies as of 02/08/2023       Reactions   Gadobenate Nausea And Vomiting   Immediately upon the infusion of 15mL multihance contrast  Patient had exteme nausea and vomiting.   No other symptoms noted .  No injury.  MRI scan was completed after patient felt better.   Immediately upon the infusion of 15mL multihance contrast  Patient had exteme nausea and vomiting.   No other symptoms noted .  No injury.  MRI scan was completed after patient felt better.          Medication List     STOP taking these medications    irbesartan 300 MG tablet Commonly known as: AVAPRO       TAKE these medications    albuterol 108 (90 Base) MCG/ACT inhaler Commonly known as: VENTOLIN HFA Inhale into the lungs every 6 (six) hours as needed for wheezing or shortness of breath.   amitriptyline 10 MG tablet Commonly known as: ELAVIL Take 10 mg by mouth every evening.   amLODipine 5 MG tablet Commonly known as: NORVASC Take 5 mg by mouth 2 (two) times daily.   atorvastatin 40 MG tablet Commonly known as: LIPITOR Take 40 mg by mouth every evening.   carvedilol  6.25 MG tablet Commonly known as: COREG Take 6.25 mg by mouth 2 (two) times daily with a meal.   Clenpiq 10-3.5-12 MG-GM -GM/175ML Soln Generic drug: Sod Picosulfate-Mag Ox-Cit Acd Take 1 kit by mouth as directed.   cyanocobalamin 1000 MCG tablet Commonly known as: VITAMIN B12 Take 1,000 mcg by mouth daily. Now (Brand)   dorzolamide 2 % ophthalmic solution Commonly known as: TRUSOPT Place 1 drop into the left eye 2 (two) times daily.   Eliquis 5 MG Tabs tablet Generic drug: apixaban TAKE 1  TABLET(5 MG) BY MOUTH TWICE DAILY   ferrous sulfate 325 (65 FE) MG tablet Take 325 mg by mouth in the morning and at bedtime. Iron Nature's Bounty   folic acid 800 MCG tablet Commonly known as: FOLVITE Take 800 mcg by mouth every evening. With B12 Now (Brand)   hydrALAZINE 25 MG tablet Commonly known as: APRESOLINE Take 50 mg by mouth in the morning and at bedtime.   Kerendia 10 MG Tabs Generic drug: Finerenone Take 10 mg by mouth daily.   latanoprost 0.005 % ophthalmic solution Commonly known as: XALATAN Place 1 drop into both eyes at bedtime.   linagliptin 5 MG Tabs tablet Commonly known as: TRADJENTA Take 5 mg by mouth in the morning.   loratadine 10 MG tablet Commonly known as: CLARITIN Take 10 mg by mouth every morning.   LORazepam 0.5 MG tablet Commonly known as: ATIVAN Take 0.5 mg by mouth 2 (two) times daily. prn   omeprazole 40 MG capsule Commonly known as: PRILOSEC Take 1 capsule (40 mg total) by mouth daily.   PROSTATE SUPPORT PO Take 1 capsule by mouth every evening. NOW Prostate Support   sucralfate 1 GM/10ML suspension Commonly known as: CARAFATE Take 10 mLs (1 g total) by mouth 4 (four) times daily.   Vitamin C/Natural Rose Hips 1000 MG tablet Generic drug: ascorbic acid Take 1,000 mg by mouth every evening.   Vitamin D3 125 MCG (5000 UT) Caps Take 5,000 Units by mouth every evening.        Follow-up Information     Pomposini, Rande Brunt, MD. Schedule an appointment as soon as possible for a visit in 1 week(s).   Specialty: Internal Medicine               Allergies  Allergen Reactions   Gadobenate Nausea And Vomiting    Immediately upon the infusion of 15mL multihance contrast  Patient had exteme nausea and vomiting.   No other symptoms noted .  No injury.  MRI scan was completed after patient felt better.   Immediately upon the infusion of 15mL multihance contrast  Patient had exteme nausea and vomiting.   No other symptoms noted .   No injury.  MRI scan was completed after patient felt better.       Consultations: None   Procedures/Studies: MR Brain Wo Contrast (neuro protocol)  Result Date: 02/07/2023 CLINICAL DATA:  Neuro deficit, acute, stroke suspected EXAM: MRI HEAD WITHOUT CONTRAST TECHNIQUE: Multiplanar, multiecho pulse sequences of the brain and surrounding structures were obtained without intravenous contrast. COMPARISON:  Same day CT head. FINDINGS: Brain: No acute infarction, hemorrhage, hydrocephalus, extra-axial collection or mass lesion. Vascular: Major arterial flow voids are maintained at the skull base. Skull and upper cervical spine: Normal marrow signal. Sinuses/Orbits: Mucosal thickening in the inferior maxillary sinuses with left maxillary sinus retention cyst versus polyp. Otherwise, clear sinuses. No acute orbital findings. Other: No mastoid effusions. IMPRESSION: No evidence of acute intracranial  abnormality. Electronically Signed   By: Feliberto Harts M.D.   On: 02/07/2023 17:59   CT Head Wo Contrast  Result Date: 02/07/2023 CLINICAL DATA:  Dizziness, fall EXAM: CT HEAD WITHOUT CONTRAST TECHNIQUE: Contiguous axial images were obtained from the base of the skull through the vertex without intravenous contrast. RADIATION DOSE REDUCTION: This exam was performed according to the departmental dose-optimization program which includes automated exposure control, adjustment of the mA and/or kV according to patient size and/or use of iterative reconstruction technique. COMPARISON:  None Available. FINDINGS: Brain: No acute intracranial findings are seen. There are no signs of bleeding within the cranium. Cortical sulci are prominent. The decreased density in periventricular and subcortical white matter. Vascular: Scattered arterial calcifications are seen. Skull: No acute findings are seen. Sinuses/Orbits: Mucosal thickening and mucous retention cysts are seen in ethmoid and maxillary sinuses. The defect in  right mastoid, possibly suggesting previous right mastoidectomy. Other: None. IMPRESSION: No acute intracranial findings are seen in noncontrast CT brain. Atrophy. Small vessel disease. Chronic ethmoid and maxillary sinusitis. Electronically Signed   By: Ernie Avena M.D.   On: 02/07/2023 15:31   DG Chest 2 View  Result Date: 02/07/2023 CLINICAL DATA:  Presyncope.  Fall. EXAM: CHEST - 2 VIEW COMPARISON:  AP chest 12/27/2022 FINDINGS: Cardiac silhouette mediastinal contours are within normal limits with mildly to moderately tortuous descending thoracic aorta and high-grade atherosclerotic calcifications within the aortic arch. Unchanged mild elevation of the right hemidiaphragm. Mild right basilar greater than left basilar bronchovascular crowding. No focal airspace opacity to indicate pneumonia. No pleural effusion or pneumothorax. No pulmonary edema. Moderate multilevel degenerative disc changes of the thoracic spine. IMPRESSION: 1. No acute cardiopulmonary process. 2. Unchanged mild elevation of the right hemidiaphragm. 3. Mild right basilar greater than left basilar bronchovascular crowding. Electronically Signed   By: Neita Garnet M.D.   On: 02/07/2023 14:59   DG Elbow Complete Left  Result Date: 02/07/2023 CLINICAL DATA:  Fall.  Left elbow pain. EXAM: LEFT ELBOW - COMPLETE 3+ VIEW COMPARISON:  None Available. FINDINGS: Mildly decreased bone mineralization. Normal position of the distal anterior humeral fat pad without evidence of elbow joint effusion. Minimal radial head-neck junction degenerative spurs. Moderate radiocarpal and mild trochlea-coronoid joint space narrowing. Mild peripheral medial elbow degenerative osteophytosis. A corticated 5 mm ossicle at the common flexor tendon origin at the medial epicondyle, chronic enthesopathic change. No definite acute fracture is seen.  No dislocation. IMPRESSION: 1. No definite acute fracture is seen. 2. Mild-moderate elbow joint arthrosis. 3. Mild  chronic enthesopathic change at the common flexor tendon origin. Electronically Signed   By: Neita Garnet M.D.   On: 02/07/2023 14:58     Discharge Exam: Vitals:   02/07/23 2347 02/08/23 0405  BP: 123/65 (!) 135/56  Pulse: (!) 59 (!) 58  Resp: 20 18  Temp: 97.8 F (36.6 C) 97.6 F (36.4 C)  SpO2: 96% 99%   Vitals:   02/07/23 1915 02/07/23 1959 02/07/23 2347 02/08/23 0405  BP: (!) 156/62 116/69 123/65 (!) 135/56  Pulse: (!) 57 70 (!) 59 (!) 58  Resp: 19 20 20 18   Temp:  97.7 F (36.5 C) 97.8 F (36.6 C) 97.6 F (36.4 C)  TempSrc:   Oral Oral  SpO2: 95% 99% 96% 99%  Weight:  94.4 kg    Height:        General: Pt is alert, awake, not in acute distress, eating breakfast Cardiovascular: RRR, S1/S2 +, no rubs, no gallops Respiratory: CTA  bilaterally, no wheezing, no rhonchi Abdominal: Soft, NT, ND, bowel sounds + Extremities: no edema, no cyanosis    The results of significant diagnostics from this hospitalization (including imaging, microbiology, ancillary and laboratory) are listed below for reference.     Microbiology: No results found for this or any previous visit (from the past 240 hour(s)).   Labs: BNP (last 3 results) No results for input(s): "BNP" in the last 8760 hours. Basic Metabolic Panel: Recent Labs  Lab 02/07/23 1310 02/07/23 1428 02/08/23 0427  NA  --  132* 133*  K  --  3.9 3.2*  CL  --  100 104  CO2  --  22 21*  GLUCOSE  --  107* 87  BUN  --  50* 51*  CREATININE  --  2.33* 1.92*  CALCIUM  --  8.3* 8.2*  MG 1.5*  --   --   PHOS  --  3.2  --    Liver Function Tests: Recent Labs  Lab 02/07/23 1428  AST 25  ALT 48*  ALKPHOS 87  BILITOT 2.2*  PROT 5.8*  ALBUMIN 2.8*   No results for input(s): "LIPASE", "AMYLASE" in the last 168 hours. No results for input(s): "AMMONIA" in the last 168 hours. CBC: Recent Labs  Lab 02/07/23 1337 02/08/23 0427  WBC 16.3* 9.7  HGB 12.2* 10.4*  HCT 35.8* 30.3*  MCV 96.8 96.8  PLT 150 135*    Cardiac Enzymes: No results for input(s): "CKTOTAL", "CKMB", "CKMBINDEX", "TROPONINI" in the last 168 hours. BNP: Invalid input(s): "POCBNP" CBG: Recent Labs  Lab 02/07/23 1505 02/08/23 0718  GLUCAP 99 101*   D-Dimer No results for input(s): "DDIMER" in the last 72 hours. Hgb A1c No results for input(s): "HGBA1C" in the last 72 hours. Lipid Profile No results for input(s): "CHOL", "HDL", "LDLCALC", "TRIG", "CHOLHDL", "LDLDIRECT" in the last 72 hours. Thyroid function studies No results for input(s): "TSH", "T4TOTAL", "T3FREE", "THYROIDAB" in the last 72 hours.  Invalid input(s): "FREET3" Anemia work up No results for input(s): "VITAMINB12", "FOLATE", "FERRITIN", "TIBC", "IRON", "RETICCTPCT" in the last 72 hours. Urinalysis    Component Value Date/Time   COLORURINE AMBER (A) 02/07/2023 1624   APPEARANCEUR CLOUDY (A) 02/07/2023 1624   LABSPEC 1.013 02/07/2023 1624   PHURINE 5.0 02/07/2023 1624   GLUCOSEU NEGATIVE 02/07/2023 1624   HGBUR NEGATIVE 02/07/2023 1624   BILIRUBINUR NEGATIVE 02/07/2023 1624   KETONESUR NEGATIVE 02/07/2023 1624   PROTEINUR >=300 (A) 02/07/2023 1624   NITRITE NEGATIVE 02/07/2023 1624   LEUKOCYTESUR NEGATIVE 02/07/2023 1624   Sepsis Labs Recent Labs  Lab 02/07/23 1337 02/08/23 0427  WBC 16.3* 9.7   Microbiology No results found for this or any previous visit (from the past 240 hour(s)).   Time coordinating discharge: 35 minutes  SIGNED:  Marcelline Mates, MS4 Working with Dr. Maurilio Lovely

## 2023-02-08 NOTE — Progress Notes (Signed)
Transition of Care [facility]) - Inpatient Brief Assessment   Patient Details  Name: Herbert Wood MRN: [id number] Date of Birth: March 06, 1935  [facility] of [facility] CM/SW Contact:    Leitha Bleak, RN Phone Number: 02/08/2023, 9:58 AM   Clinical Narrative:  Patient admitted in OBS for Acute kidney injury. Patient is discharging home. No needs.   Transition of Care Asessment: Insurance and Status: (P) Insurance coverage has been reviewed Patient has primary care physician: (P) Yes Home environment has been reviewed: (P) Home with spouse Prior level of function:: (P) Independent Prior/Current Home Services: (P) No current home services Social Determinants of Health Reivew: (P) SDOH reviewed no interventions necessary Readmission risk has been reviewed: (P) Yes Transition of care needs: (P) no transition of care needs at this time

## 2023-02-08 NOTE — Progress Notes (Signed)
Patient discharged home with instructions given on medications and follow up visits,verbalized understanding. IV discontinued ,catheter intact. Accompanied by staff to awaiting vehicle.

## 2023-02-12 ENCOUNTER — Other Ambulatory Visit: Payer: Self-pay | Admitting: Hematology

## 2023-02-22 ENCOUNTER — Encounter (HOSPITAL_COMMUNITY)
Admission: RE | Admit: 2023-02-22 | Discharge: 2023-02-22 | Disposition: A | Discharge status: Home / Self Care | Payer: Medicare Other | Source: Ambulatory Visit | Attending: Gastroenterology | Admitting: Gastroenterology

## 2023-02-22 ENCOUNTER — Ambulatory Visit (HOSPITAL_COMMUNITY): Payer: Medicare Other

## 2023-02-22 ENCOUNTER — Other Ambulatory Visit (HOSPITAL_COMMUNITY): Payer: Medicare Other

## 2023-02-22 NOTE — Pre-Procedure Instructions (Signed)
Spoke with wife, Ander Slade at [phone] and patient took iron this morning, which he was supposed to stop on 7/22. Dr [name] and Mindy at office messaged to see if they wanted to proceed on 7/30. I told wife I would call her back when I heard from them.

## 2023-02-22 NOTE — Pre-Procedure Instructions (Signed)
Attempted pre-op phone call. Left VM on [phone] and [phone] for patient to call us back.

## 2023-02-23 ENCOUNTER — Telehealth (INDEPENDENT_AMBULATORY_CARE_PROVIDER_SITE_OTHER): Payer: Self-pay | Admitting: Gastroenterology

## 2023-02-23 NOTE — Telephone Encounter (Signed)
Pt wife contacted and verbalized understanding. Wife states she has been sick and is usually on top of things. Pt will not take iron starting today per wife  Dolores Frame, MD  Marlowe Shores, LPN It should be OK for 7/30 but please ask him to not take any more thanks       Previous Messages    ----- Message ----- From: Marlowe Shores, LPN Sent: 1/61/0960   1:51 PM EDT To: Dolores Frame, MD Subject: FW: iron                                       ----- Message ----- From: Elsie Amis, RN Sent: 02/22/2023   1:11 PM EDT To: Marlowe Shores, LPN Subject: RE: iron                                      He never stopped it, so he's been on it the whole time. ----- Message ----- From: Marlowe Shores, LPN Sent: 4/54/0981  12:39 PM EDT To: Elsie Amis, RN Subject: RE: iron                                      Good afternoon. Did he take it any other day other than today? ----- Message ----- From: Elsie Amis, RN Sent: 02/22/2023   9:22 AM EDT To: Mindy S Estudillo, CMA; Marlowe Shores, LPN; * Subject: iron                                          Good morning! Mr Pellicano was supposed to stop his iron on 7/22 and has not. He took it today. Do you still want to proceed with TCS on 7/30?

## 2023-02-26 ENCOUNTER — Encounter (HOSPITAL_COMMUNITY): Payer: Self-pay

## 2023-02-26 ENCOUNTER — Encounter (INDEPENDENT_AMBULATORY_CARE_PROVIDER_SITE_OTHER): Payer: Self-pay

## 2023-02-26 ENCOUNTER — Telehealth (INDEPENDENT_AMBULATORY_CARE_PROVIDER_SITE_OTHER): Payer: Self-pay | Admitting: Gastroenterology

## 2023-02-26 ENCOUNTER — Other Ambulatory Visit: Payer: Self-pay

## 2023-02-26 NOTE — Telephone Encounter (Signed)
Pt wife Joy left voicemail requesting call back this morning before pt starts prep for procedure tomorrow.  Left message for pt wife to return call

## 2023-02-26 NOTE — Telephone Encounter (Signed)
Spoke with wife. Pt has sniffle and stopped up head on Saturday into Sunday(7/20-7/21); by Monday he was feeling better. Wife tested positive for COVID last week. Wife states her contagious period was over yesterday 02/25/23. Pt is not having any symptoms at this time. Wife states she spoke to someone at the hospital and they told her it would be fine to continue with procedure. Contacted Carolyn at Endo just to double check and make sure it would be ok to continue with procedure. Eber Jones states as long as pt is not having any symptoms, it should be ok to continue. Pt wife would have to wear a mask or wait in car. Pt wife verbalized understanding.

## 2023-02-27 ENCOUNTER — Ambulatory Visit (HOSPITAL_BASED_OUTPATIENT_CLINIC_OR_DEPARTMENT_OTHER): Payer: Medicare Other | Admitting: Certified Registered"

## 2023-02-27 ENCOUNTER — Encounter (HOSPITAL_COMMUNITY)
Admission: RE | Disposition: A | Discharge status: Home / Self Care | Payer: Self-pay | Source: Home / Self Care | Attending: Gastroenterology

## 2023-02-27 ENCOUNTER — Ambulatory Visit (HOSPITAL_COMMUNITY)
Admission: RE | Admit: 2023-02-27 | Discharge: 2023-02-27 | Disposition: A | Discharge status: Home / Self Care | Payer: Medicare Other | Attending: Gastroenterology | Admitting: Gastroenterology

## 2023-02-27 ENCOUNTER — Encounter (INDEPENDENT_AMBULATORY_CARE_PROVIDER_SITE_OTHER): Payer: Self-pay | Admitting: *Deleted

## 2023-02-27 ENCOUNTER — Ambulatory Visit (HOSPITAL_COMMUNITY): Payer: Medicare Other | Admitting: Certified Registered"

## 2023-02-27 ENCOUNTER — Encounter (HOSPITAL_COMMUNITY): Payer: Self-pay | Admitting: Gastroenterology

## 2023-02-27 DIAGNOSIS — Z85828 Personal history of other malignant neoplasm of skin: Secondary | ICD-10-CM | POA: Insufficient documentation

## 2023-02-27 DIAGNOSIS — E1122 Type 2 diabetes mellitus with diabetic chronic kidney disease: Secondary | ICD-10-CM | POA: Diagnosis not present

## 2023-02-27 DIAGNOSIS — D126 Benign neoplasm of colon, unspecified: Secondary | ICD-10-CM | POA: Diagnosis not present

## 2023-02-27 DIAGNOSIS — Z85038 Personal history of other malignant neoplasm of large intestine: Secondary | ICD-10-CM

## 2023-02-27 DIAGNOSIS — D128 Benign neoplasm of rectum: Secondary | ICD-10-CM | POA: Diagnosis not present

## 2023-02-27 DIAGNOSIS — K219 Gastro-esophageal reflux disease without esophagitis: Secondary | ICD-10-CM | POA: Insufficient documentation

## 2023-02-27 DIAGNOSIS — N183 Chronic kidney disease, stage 3 unspecified: Secondary | ICD-10-CM | POA: Diagnosis not present

## 2023-02-27 DIAGNOSIS — D682 Hereditary deficiency of other clotting factors: Secondary | ICD-10-CM | POA: Insufficient documentation

## 2023-02-27 DIAGNOSIS — C787 Secondary malignant neoplasm of liver and intrahepatic bile duct: Secondary | ICD-10-CM | POA: Insufficient documentation

## 2023-02-27 DIAGNOSIS — I129 Hypertensive chronic kidney disease with stage 1 through stage 4 chronic kidney disease, or unspecified chronic kidney disease: Secondary | ICD-10-CM

## 2023-02-27 DIAGNOSIS — Z7984 Long term (current) use of oral hypoglycemic drugs: Secondary | ICD-10-CM | POA: Insufficient documentation

## 2023-02-27 DIAGNOSIS — Z87891 Personal history of nicotine dependence: Secondary | ICD-10-CM | POA: Diagnosis not present

## 2023-02-27 DIAGNOSIS — Z86718 Personal history of other venous thrombosis and embolism: Secondary | ICD-10-CM | POA: Insufficient documentation

## 2023-02-27 DIAGNOSIS — E785 Hyperlipidemia, unspecified: Secondary | ICD-10-CM | POA: Diagnosis not present

## 2023-02-27 DIAGNOSIS — Z980 Intestinal bypass and anastomosis status: Secondary | ICD-10-CM | POA: Insufficient documentation

## 2023-02-27 DIAGNOSIS — Z86010 Personal history of colonic polyps: Secondary | ICD-10-CM | POA: Diagnosis not present

## 2023-02-27 DIAGNOSIS — Z85818 Personal history of malignant neoplasm of other sites of lip, oral cavity, and pharynx: Secondary | ICD-10-CM | POA: Diagnosis not present

## 2023-02-27 DIAGNOSIS — Z08 Encounter for follow-up examination after completed treatment for malignant neoplasm: Secondary | ICD-10-CM | POA: Diagnosis not present

## 2023-02-27 DIAGNOSIS — D122 Benign neoplasm of ascending colon: Secondary | ICD-10-CM | POA: Insufficient documentation

## 2023-02-27 DIAGNOSIS — K648 Other hemorrhoids: Secondary | ICD-10-CM | POA: Diagnosis not present

## 2023-02-27 DIAGNOSIS — Z09 Encounter for follow-up examination after completed treatment for conditions other than malignant neoplasm: Secondary | ICD-10-CM | POA: Diagnosis present

## 2023-02-27 DIAGNOSIS — K449 Diaphragmatic hernia without obstruction or gangrene: Secondary | ICD-10-CM | POA: Insufficient documentation

## 2023-02-27 DIAGNOSIS — Z7901 Long term (current) use of anticoagulants: Secondary | ICD-10-CM | POA: Diagnosis not present

## 2023-02-27 DIAGNOSIS — K5730 Diverticulosis of large intestine without perforation or abscess without bleeding: Secondary | ICD-10-CM | POA: Insufficient documentation

## 2023-02-27 HISTORY — PX: COLONOSCOPY WITH PROPOFOL: SHX5780

## 2023-02-27 HISTORY — PX: POLYPECTOMY: SHX5525

## 2023-02-27 LAB — HM COLONOSCOPY

## 2023-02-27 LAB — GLUCOSE, CAPILLARY: Glucose-Capillary: 149 mg/dL — ABNORMAL HIGH (ref 70–99)

## 2023-02-27 SURGERY — COLONOSCOPY WITH PROPOFOL
Anesthesia: General

## 2023-02-27 MED ORDER — EPHEDRINE SULFATE-NACL 50-0.9 MG/10ML-% IV SOSY
PREFILLED_SYRINGE | INTRAVENOUS | Status: DC | PRN
  Administered 2023-02-27: 10 mg via INTRAVENOUS

## 2023-02-27 MED ORDER — LIDOCAINE HCL (PF) 2 % IJ SOLN
INTRAMUSCULAR | Status: AC
  Filled 2023-02-27: qty 5

## 2023-02-27 MED ORDER — EPHEDRINE 5 MG/ML INJ
INTRAVENOUS | Status: AC
  Filled 2023-02-27: qty 5

## 2023-02-27 MED ORDER — GLYCOPYRROLATE PF 0.2 MG/ML IJ SOSY
PREFILLED_SYRINGE | INTRAMUSCULAR | Status: DC | PRN
  Administered 2023-02-27: .2 mg via INTRAVENOUS

## 2023-02-27 MED ORDER — PHENYLEPHRINE 80 MCG/ML (10ML) SYRINGE FOR IV PUSH (FOR BLOOD PRESSURE SUPPORT)
PREFILLED_SYRINGE | INTRAVENOUS | Status: AC
  Filled 2023-02-27: qty 10

## 2023-02-27 MED ORDER — PHENYLEPHRINE 80 MCG/ML (10ML) SYRINGE FOR IV PUSH (FOR BLOOD PRESSURE SUPPORT)
PREFILLED_SYRINGE | INTRAVENOUS | Status: DC | PRN
  Administered 2023-02-27: 160 ug via INTRAVENOUS

## 2023-02-27 MED ORDER — GLYCOPYRROLATE PF 0.2 MG/ML IJ SOSY
PREFILLED_SYRINGE | INTRAMUSCULAR | Status: AC
  Filled 2023-02-27: qty 1

## 2023-02-27 MED ORDER — LACTATED RINGERS IV SOLN: INTRAVENOUS | Status: DC

## 2023-02-27 MED ORDER — LIDOCAINE HCL (CARDIAC) PF 100 MG/5ML IV SOSY
PREFILLED_SYRINGE | INTRAVENOUS | Status: DC | PRN
  Administered 2023-02-27: 80 mg via INTRAVENOUS

## 2023-02-27 MED ORDER — PROPOFOL 10 MG/ML IV BOLUS
INTRAVENOUS | Status: DC | PRN
Start: 2023-02-27 — End: 2023-02-27
  Administered 2023-02-27: 80 mg via INTRAVENOUS

## 2023-02-27 MED ORDER — PROPOFOL 500 MG/50ML IV EMUL
INTRAVENOUS | Status: DC | PRN
  Administered 2023-02-27: 100 ug/kg/min via INTRAVENOUS

## 2023-02-27 MED ORDER — PROPOFOL 500 MG/50ML IV EMUL
INTRAVENOUS | Status: AC
  Filled 2023-02-27: qty 50

## 2023-02-27 NOTE — Discharge Instructions (Signed)
You are being discharged to home.  Resume your previous diet.  We are waiting for your pathology results.  Your physician has indicated that a repeat colonoscopy is not recommended due to your current age for surveillance.  Restart Eliquis tomorrow morning.

## 2023-02-27 NOTE — Transfer of Care (Addendum)
129Immediate Anesthesia Transfer of Care Note  Patient: Giordan Gilbertson  Procedure(s) Performed: COLONOSCOPY WITH PROPOFOL POLYPECTOMY  Patient Location: PACU and Short Stay  Anesthesia Type:General  Level of Consciousness: awake and patient cooperative  Airway & Oxygen Therapy: Patient Spontanous Breathing and Patient connected to face mask oxygen  Post-op Assessment: Report given to RN and Post -op Vital signs reviewed and stable  Post vital signs: Reviewed and stable  Last Vitals:  Vitals Value Taken Time  BP 129/35 02/27/23   0924  Temp 36.6 02/27/23   0924  Pulse 51 02/27/23   0924  Resp 22 02/27/23   0924  SpO2 96% 02/27/23   0924    Last Pain:  Vitals:   02/27/23 0748  PainSc: 0-No pain         Complications: No notable events documented.

## 2023-02-27 NOTE — Anesthesia Preprocedure Evaluation (Signed)
Anesthesia Evaluation  Patient identified by MRN, date of birth, ID band Patient awake    Reviewed: Allergy & Precautions, H&P , NPO status , Patient's Chart, lab work & pertinent test results, reviewed documented beta blocker date and time   Airway Mallampati: II  TM Distance: >3 FB Neck ROM: full    Dental no notable dental hx.    Pulmonary neg pulmonary ROS, shortness of breath, former smoker   Pulmonary exam normal breath sounds clear to auscultation       Cardiovascular Exercise Tolerance: Good hypertension, + Peripheral Vascular Disease  negative cardio ROS + Valvular Problems/Murmurs  Rhythm:regular Rate:Normal     Neuro/Psych  Neuromuscular disease negative neurological ROS  negative psych ROS   GI/Hepatic negative GI ROS, Neg liver ROS,GERD  ,,  Endo/Other  negative endocrine ROSdiabetes    Renal/GU Renal diseasenegative Renal ROS  negative genitourinary   Musculoskeletal   Abdominal   Peds  Hematology negative hematology ROS (+) Blood dyscrasia, anemia   Anesthesia Other Findings   Reproductive/Obstetrics negative OB ROS                             Anesthesia Physical Anesthesia Plan  ASA: 3  Anesthesia Plan: General   Post-op Pain Management:    Induction:   PONV Risk Score and Plan: Propofol infusion  Airway Management Planned:   Additional Equipment:   Intra-op Plan:   Post-operative Plan:   Informed Consent: I have reviewed the patients History and Physical, chart, labs and discussed the procedure including the risks, benefits and alternatives for the proposed anesthesia with the patient or authorized representative who has indicated his/her understanding and acceptance.     Dental Advisory Given  Plan Discussed with: CRNA  Anesthesia Plan Comments:        Anesthesia Quick Evaluation

## 2023-02-27 NOTE — Op Note (Signed)
[facility] Patient Name: Herbert Wood Procedure Date: 02/27/2023 8:46 AM MRN: [id number] Date of Birth: [birth year] Attending MD: Katrinka Blazing , , [id number] CSN: [id number] Age: 88 Admit Type: Outpatient Procedure:                Colonoscopy Indications:              High risk colon cancer surveillance: Personal                            history of colon cancer Providers:                Katrinka Blazing, Nena Polio, RN, Pandora Leiter,                            Technician Referring MD:              Medicines:                Monitored Anesthesia Care Complications:            No immediate complications. Estimated Blood Loss:     Estimated blood loss: none. Procedure:                Pre-Anesthesia Assessment:                           - Prior to the procedure, a History and Physical                            was performed, and patient medications, allergies                            and sensitivities were reviewed. The patient's                            tolerance of previous anesthesia was reviewed.                           - The risks and benefits of the procedure and the                            sedation options and risks were discussed with the                            patient. All questions were answered and informed                            consent was obtained.                           - ASA Grade Assessment: III - A patient with severe                            systemic disease.                           After obtaining informed consent, the colonoscope  was passed under direct vision. Throughout the                            procedure, the patient's blood pressure, pulse, and                            oxygen saturations were monitored continuously. The                            [id number]-HQ190L ([id number]) scope was introduced through                            the anus and advanced to the the cecum, identified                             by appendiceal orifice and ileocecal valve. The                            colonoscopy was performed without difficulty. The                            patient tolerated the procedure well. The quality                            of the bowel preparation was excellent. Scope In: 9:03:35 AM Scope Out: 9:16:48 AM Scope Withdrawal Time: 0 hours 7 minutes 54 seconds  Total Procedure Duration: 0 hours 13 minutes 13 seconds  Findings:      The perianal and digital rectal examinations were normal.      A 5 mm polyp was found in the ascending colon. The polyp was sessile.       The polyp was removed with a cold snare. Resection and retrieval were       complete.      A 1 mm polyp was found in the ascending colon. The polyp was sessile.       The polyp was removed with a cold biopsy forceps. Resection and       retrieval were complete.      There was evidence of a prior end-to-side colo-colonic anastomosis at 40       cm proximal to the anus. This was patent and was characterized by       healthy appearing mucosa. The anastomosis was traversed. Biopsies were       taken from an inflammatory polyp located at the anastomosis with a cold       forceps for histology.      A 3 mm polyp was found in the rectum. The polyp was sessile. The polyp       was removed with a cold snare. Resection and retrieval were complete.      Multiple large-mouthed and small-mouthed diverticula were found in the       entire colon.      Non-bleeding internal hemorrhoids were found during retroflexion. The       hemorrhoids were small. Impression:               - One 5 mm polyp in the ascending colon, removed  with a cold snare. Resected and retrieved.                           - One 1 mm polyp in the ascending colon, removed                            with a cold biopsy forceps. Resected and retrieved.                           - Patent end-to-side colo-colonic anastomosis,                             characterized by healthy appearing mucosa. Biopsied.                           - One 3 mm polyp in the rectum, removed with a cold                            snare. Resected and retrieved.                           - Diverticulosis in the entire examined colon.                           - Non-bleeding internal hemorrhoids. Moderate Sedation:      Per Anesthesia Care Recommendation:           - Discharge patient to home (ambulatory).                           - Resume previous diet.                           - Await pathology results.                           - Repeat colonoscopy is not recommended due to                            current age for surveillance.                           - Restart Eliquis tomorrow. Procedure Code(s):        --- Professional ---                           [phone], Colonoscopy, flexible; with removal of                            tumor(s), polyp(s), or other lesion(s) by snare                            technique                           [id number], 59, Colonoscopy, flexible; with biopsy,  single or multiple Diagnosis Code(s):        --- Professional ---                           [phone], Personal history of other malignant                            neoplasm of large intestine                           D12.2, Benign neoplasm of ascending colon                           D12.8, Benign neoplasm of rectum                           Z98.0, Intestinal bypass and anastomosis status                           K64.8, Other hemorrhoids                           K57.30, Diverticulosis of large intestine without                            perforation or abscess without bleeding CPT copyright 2022 [employer]. All rights reserved. The codes documented in this report are preliminary and upon coder review may  be revised to meet current compliance requirements. Katrinka Blazing, MD Katrinka Blazing,  02/27/2023 9:26:48  AM This report has been signed electronically. Number of Addenda: 0

## 2023-02-27 NOTE — Interval H&P Note (Signed)
History and Physical Interval Note:  02/27/2023 7:40 AM  Herbert Wood  has presented today for surgery, with the diagnosis of hx colon cancer.  The various methods of treatment have been discussed with the patient and family. After consideration of risks, benefits and other options for treatment, the patient has consented to  Procedure(s) with comments: COLONOSCOPY WITH PROPOFOL (N/A) - 9:15am;asa 3 as a surgical intervention.  The patient's history has been reviewed, patient examined, no change in status, stable for surgery.  I have reviewed the patient's chart and labs.  Questions were answered to the patient's satisfaction.     Katrinka Blazing Mayorga

## 2023-03-01 ENCOUNTER — Encounter (INDEPENDENT_AMBULATORY_CARE_PROVIDER_SITE_OTHER): Payer: Self-pay | Admitting: *Deleted

## 2023-03-02 ENCOUNTER — Encounter (HOSPITAL_COMMUNITY): Payer: Self-pay | Admitting: Gastroenterology

## 2023-03-02 NOTE — Anesthesia Postprocedure Evaluation (Signed)
Anesthesia Post Note  Patient: Herbert Wood  Procedure(s) Performed: COLONOSCOPY WITH PROPOFOL POLYPECTOMY  Patient location during evaluation: Phase II Anesthesia Type: General Level of consciousness: awake Pain management: pain level controlled Vital Signs Assessment: post-procedure vital signs reviewed and stable Respiratory status: spontaneous breathing and respiratory function stable Cardiovascular status: blood pressure returned to baseline and stable Postop Assessment: no headache and no apparent nausea or vomiting Anesthetic complications: no Comments: Late entry   No notable events documented.   Last Vitals:  Vitals:   02/27/23 0749 02/27/23 0924  BP: (!) 154/64 (!) 129/35  Pulse: (!) 56 (!) 51  Resp: 16 (!) 22  Temp: 36.8 C 36.6 C  SpO2: 95% 96%    Last Pain:  Vitals:   02/28/23 0949  TempSrc:   PainSc: 0-No pain                 Windell Norfolk

## 2023-03-07 IMAGING — CT CT ABD-PELV W/ CM
2 of 5 series · 15 of 46 positions shown, 17 images · IV contrast (Omnipaque or Isovue)
Comparison: None

CLINICAL DATA: Fever abdominal pain and vomiting since last night.

EXAM:
CT ABDOMEN AND PELVIS WITH CONTRAST
TECHNIQUE: Multidetector CT imaging of the abdomen and pelvis was performed
using the standard protocol following bolus administration of
intravenous contrast.

[Series 2: axial st · axial · 0.86mm/px · z∈[+582,+1042]mm · 12 of 106 slices shown, 14 images]
[im 7/106  soft-tissue]
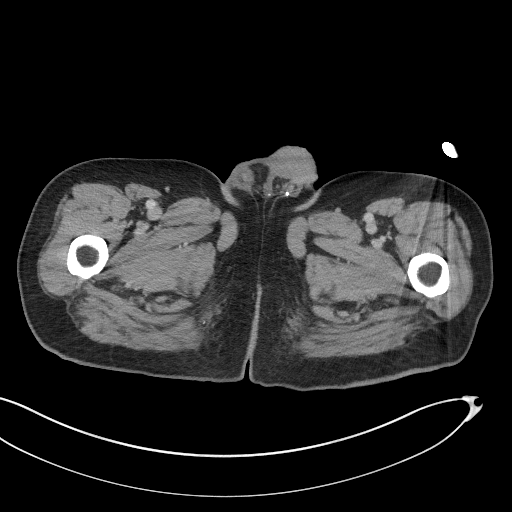
[im 7/106  bone]
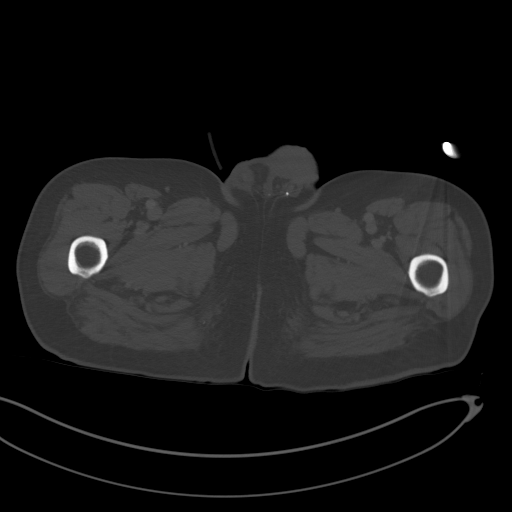
[im 14/106  soft-tissue]
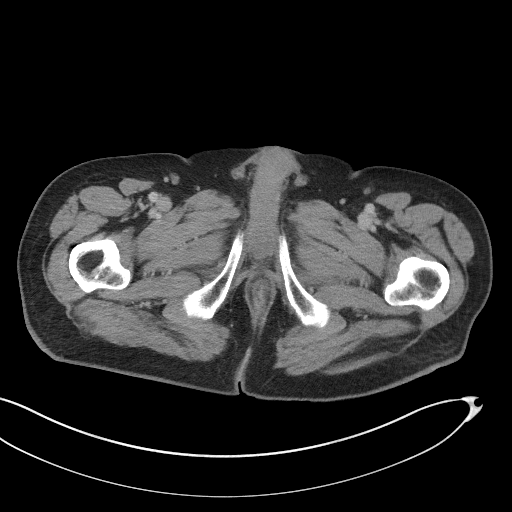
[im 27/106  soft-tissue]
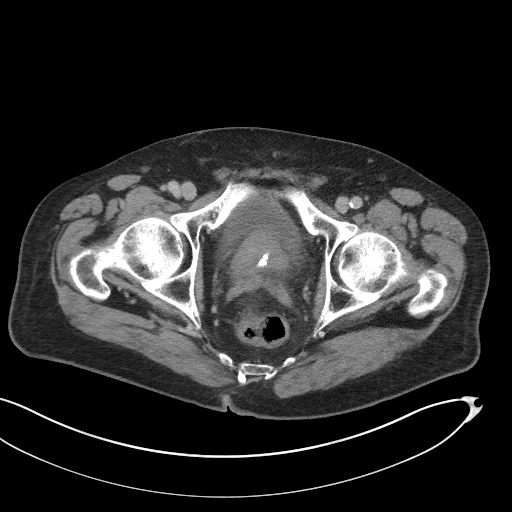
[im 33/106  soft-tissue]
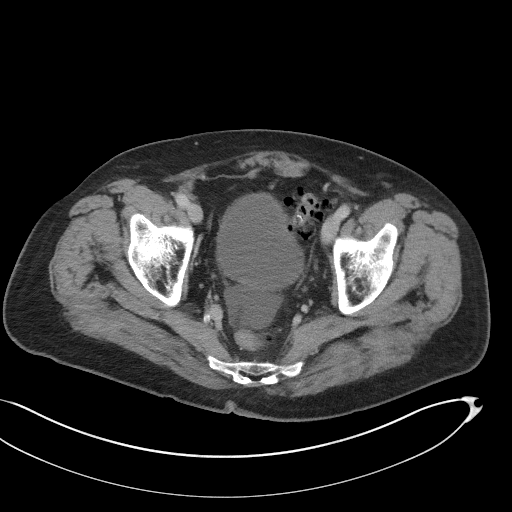
[im 40/106  soft-tissue]
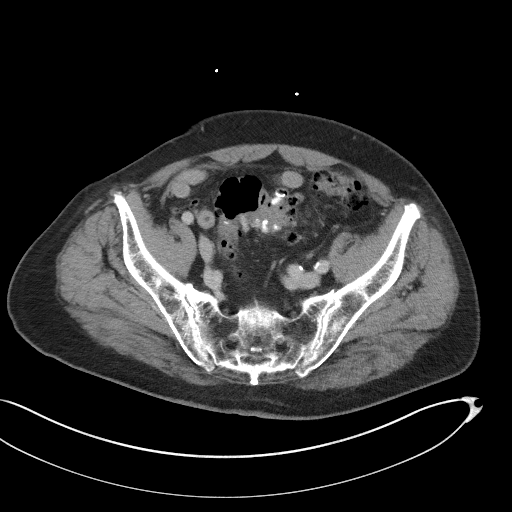
[im 46/106  soft-tissue]
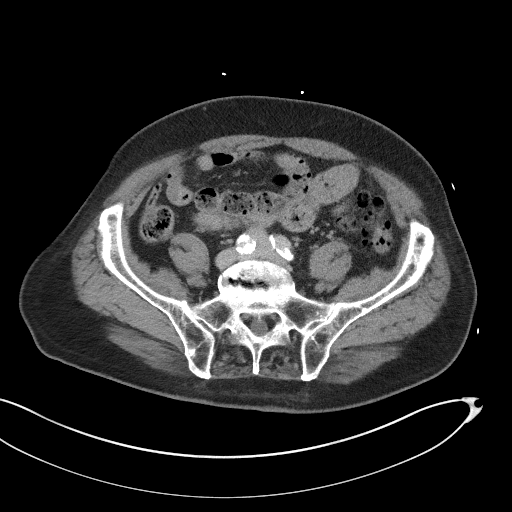
[im 60/106  soft-tissue]
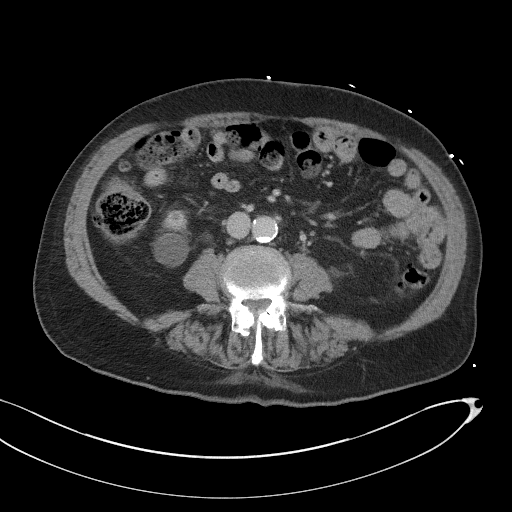
[im 66/106  soft-tissue]
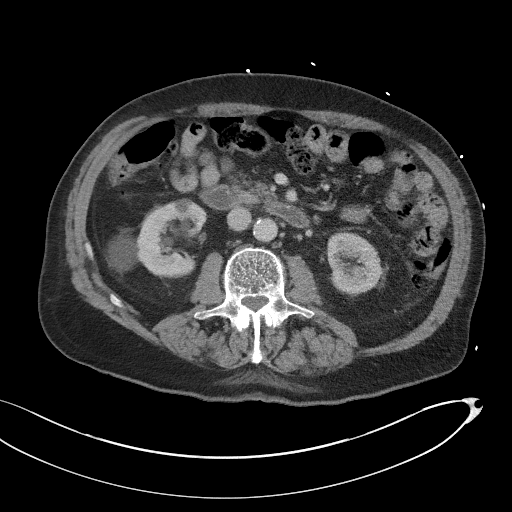
[im 73/106  soft-tissue]
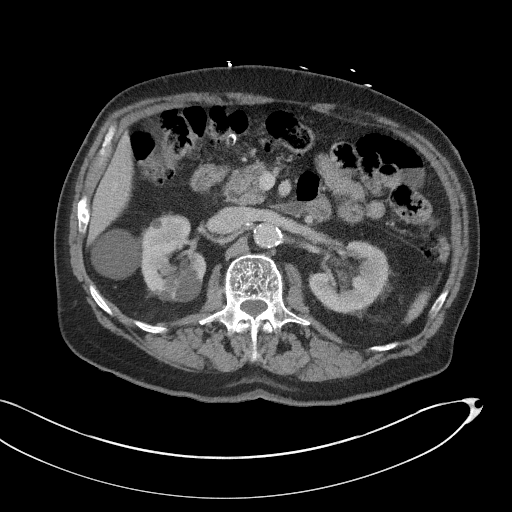
[im 73/106  bone]
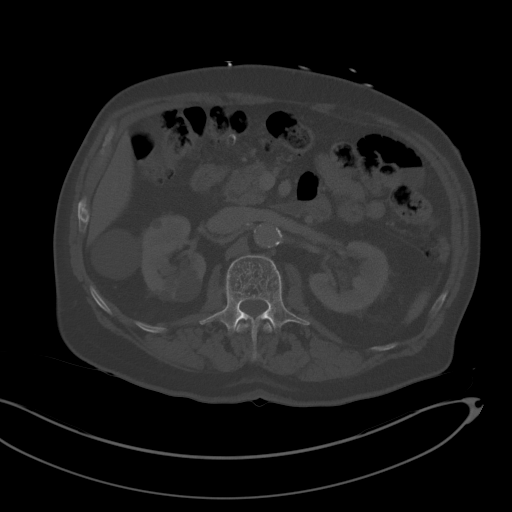
[im 79/106  soft-tissue]
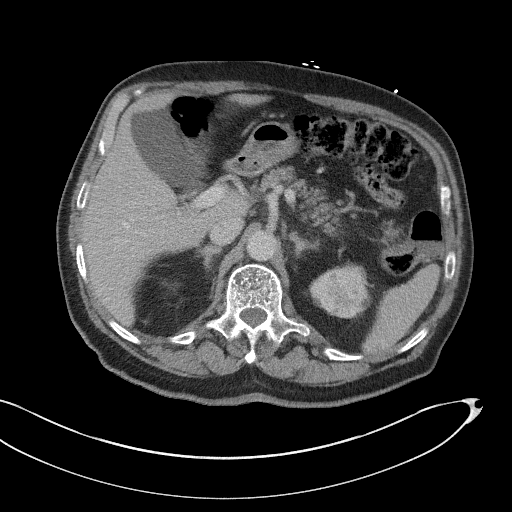
[im 92/106  soft-tissue]
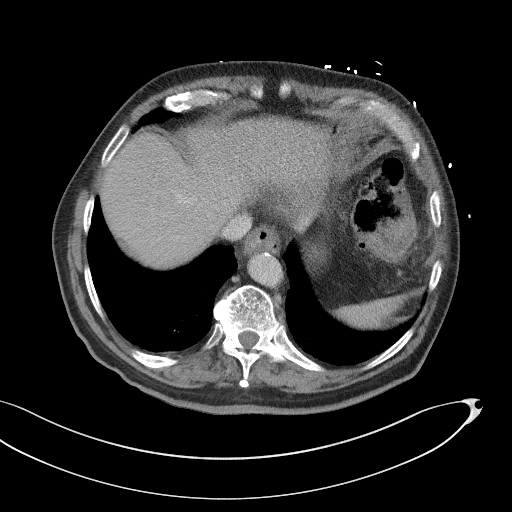
[im 99/106  soft-tissue]
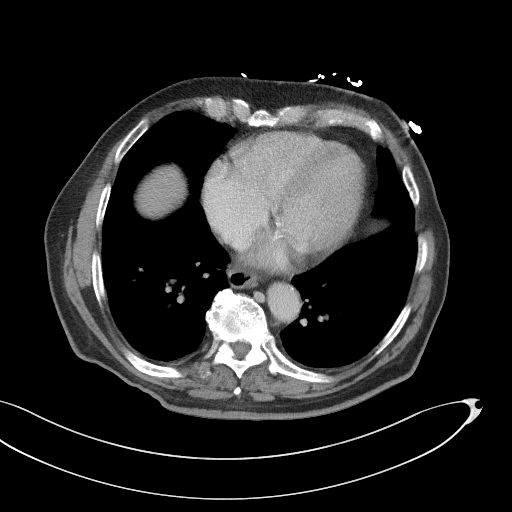

[Series 5: coronal st · coronal · 0.82mm/px · 3 of 110 slices shown]
[im 37/110  soft-tissue]
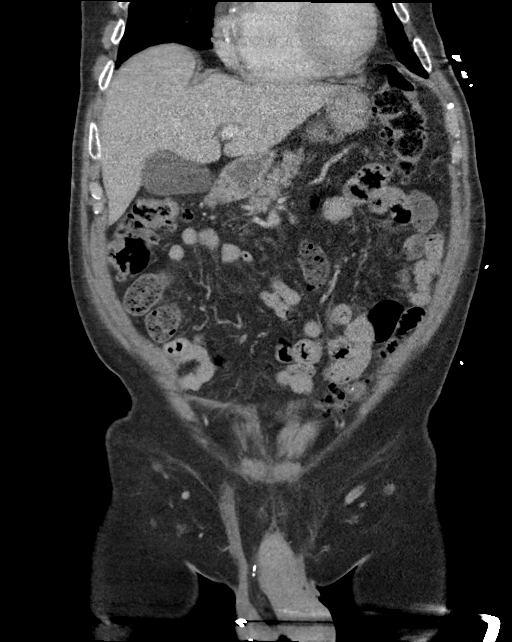
[im 49/110  soft-tissue]
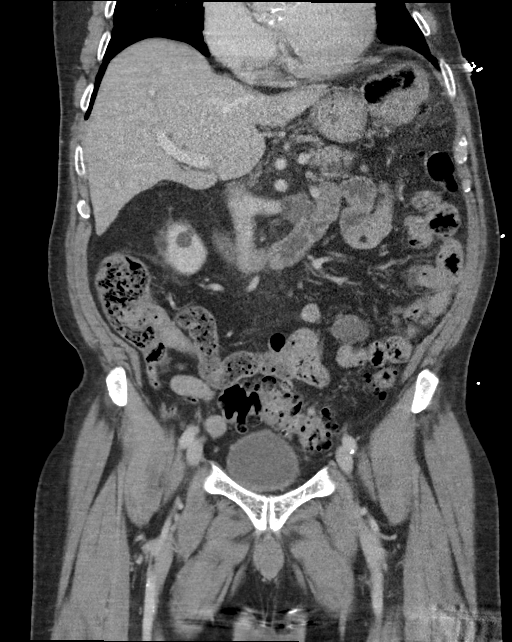
[im 61/110  soft-tissue]
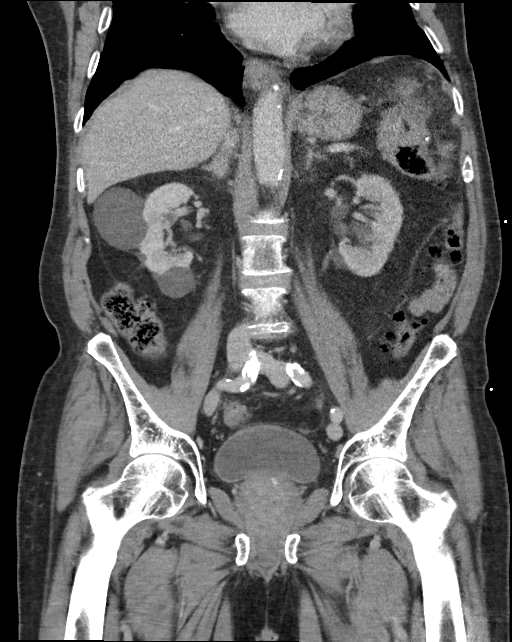

[15 of 46 positions shown; findings below may reference images not displayed]

RADIATION DOSE REDUCTION: This exam was performed according to the
departmental dose-optimization program which includes automated
exposure control, adjustment of the mA and/or kV according to
patient size and/or use of iterative reconstruction technique.

CONTRAST:  100mL OMNIPAQUE IOHEXOL 300 MG/ML  SOLN
FINDINGS: Lower chest: The lung bases are clear of acute process. No pleural
effusion or pulmonary lesions. The heart is normal in size. No
pericardial effusion. Aortic and coronary artery calcifications are
noted. The distal esophagus and aorta are unremarkable.

Hepatobiliary: No hepatic lesions or intrahepatic biliary
dilatation. The gallbladder is unremarkable. No common bile duct
dilatation.

Pancreas: No mass, inflammation ductal dilatation.

Spleen: Normal size.  No focal lesions.

Adrenals/Urinary Tract: The adrenal glands are normal.

There are numerous bilateral renal cysts. No worrisome renal
lesions. Small right renal calculi. No hydroureteronephrosis. The
bladder is unremarkable.

Stomach/Bowel: The stomach, duodenum, small bowel and terminal ileum
are unremarkable. The appendix is normal.

There is diffuse and severe colonic diverticulosis. Significant
inflammatory type changes involving the splenic flexure region the
colon. I suspect this is acute diverticulitis with a possible
intramural abscess measuring 3.5 cm. Surrounding inflammatory
changes involving the pericolonic fat. I do not see an obvious mass
lesion but would treat this patient for diverticulitis and recommend
correlation with colon cancer screening history and possible
follow-up colonoscopy if indicated.

Vascular/Lymphatic: Advanced atherosclerotic calcification involving
the aorta and branch vessels but no aneurysm or dissection. The
major venous structures are patent. No mesenteric or retroperitoneal
mass or adenopathy.

Reproductive: Enlarged prostate gland with median lobe hypertrophy
impressing on the base bladder. The seminal vesicles are
unremarkable.

Other: Small amount of free pelvic fluid is noted.

Musculoskeletal: No significant bony findings. Age related
osteoporosis.
IMPRESSION: 1. CT findings suggest acute diverticulitis involving the splenic
flexure region the colon with a probable 3.5 cm intramural abscess.
2. No obvious colonic mass. Recommend correlation with colon cancer
screening history and possible follow-up colonoscopy if indicated.
3. Advanced atherosclerotic calcification involving the aorta and
branch vessels.
4. Enlarged prostate gland with median lobe hypertrophy impressing
on the base bladder.
5. Numerous bilateral renal cysts.
6. Small right renal calculi.

Aortic Atherosclerosis ([id number]).

## 2023-03-15 ENCOUNTER — Other Ambulatory Visit: Payer: Self-pay | Admitting: Interventional Radiology

## 2023-03-15 DIAGNOSIS — C189 Malignant neoplasm of colon, unspecified: Secondary | ICD-10-CM

## 2023-03-19 ENCOUNTER — Other Ambulatory Visit: Payer: Self-pay

## 2023-03-19 MED ORDER — PREDNISONE 50 MG PO TABS: ORAL_TABLET | ORAL | 0 refills | Status: DC

## 2023-03-19 MED ORDER — DIPHENHYDRAMINE HCL 50 MG PO TABS: 50.0000 mg | ORAL_TABLET | Freq: Once | ORAL | 0 refills | Status: DC

## 2023-03-21 ENCOUNTER — Other Ambulatory Visit: Payer: Self-pay

## 2023-03-21 DIAGNOSIS — N183 Chronic kidney disease, stage 3 unspecified: Secondary | ICD-10-CM

## 2023-03-21 DIAGNOSIS — C187 Malignant neoplasm of sigmoid colon: Secondary | ICD-10-CM

## 2023-03-21 DIAGNOSIS — K769 Liver disease, unspecified: Secondary | ICD-10-CM

## 2023-03-21 DIAGNOSIS — D509 Iron deficiency anemia, unspecified: Secondary | ICD-10-CM

## 2023-03-22 ENCOUNTER — Inpatient Hospital Stay: Payer: Medicare Other | Attending: Hematology

## 2023-03-22 ENCOUNTER — Ambulatory Visit (HOSPITAL_COMMUNITY)
Admission: RE | Admit: 2023-03-22 | Discharge: 2023-03-22 | Disposition: A | Discharge status: Home / Self Care | Payer: Medicare Other | Source: Ambulatory Visit | Attending: Hematology | Admitting: Hematology

## 2023-03-22 DIAGNOSIS — N189 Chronic kidney disease, unspecified: Secondary | ICD-10-CM | POA: Insufficient documentation

## 2023-03-22 DIAGNOSIS — K769 Liver disease, unspecified: Secondary | ICD-10-CM

## 2023-03-22 DIAGNOSIS — C187 Malignant neoplasm of sigmoid colon: Secondary | ICD-10-CM | POA: Diagnosis present

## 2023-03-22 DIAGNOSIS — Z7901 Long term (current) use of anticoagulants: Secondary | ICD-10-CM | POA: Insufficient documentation

## 2023-03-22 DIAGNOSIS — Z86718 Personal history of other venous thrombosis and embolism: Secondary | ICD-10-CM | POA: Insufficient documentation

## 2023-03-22 DIAGNOSIS — C787 Secondary malignant neoplasm of liver and intrahepatic bile duct: Secondary | ICD-10-CM | POA: Insufficient documentation

## 2023-03-22 DIAGNOSIS — D631 Anemia in chronic kidney disease: Secondary | ICD-10-CM | POA: Insufficient documentation

## 2023-03-22 DIAGNOSIS — D509 Iron deficiency anemia, unspecified: Secondary | ICD-10-CM

## 2023-03-22 DIAGNOSIS — N183 Chronic kidney disease, stage 3 unspecified: Secondary | ICD-10-CM

## 2023-03-22 LAB — CBC WITH DIFFERENTIAL/PLATELET
Abs Immature Granulocytes: 0.03 10*3/uL (ref 0.00–0.07)
Basophils Absolute: 0 10*3/uL (ref 0.0–0.1)
Basophils Relative: 0 %
Eosinophils Absolute: 0 10*3/uL (ref 0.0–0.5)
Eosinophils Relative: 0 %
HCT: 37.7 % — ABNORMAL LOW (ref 39.0–52.0)
Hemoglobin: 12.8 g/dL — ABNORMAL LOW (ref 13.0–17.0)
Immature Granulocytes: 0 %
Lymphocytes Relative: 7 %
Lymphs Abs: 0.5 10*3/uL — ABNORMAL LOW (ref 0.7–4.0)
MCH: 33.1 pg (ref 26.0–34.0)
MCHC: 34 g/dL (ref 30.0–36.0)
MCV: 97.4 fL (ref 80.0–100.0)
Monocytes Absolute: 0 10*3/uL — ABNORMAL LOW (ref 0.1–1.0)
Monocytes Relative: 1 %
Neutro Abs: 6.2 10*3/uL (ref 1.7–7.7)
Neutrophils Relative %: 92 %
Platelets: 203 10*3/uL (ref 150–400)
RBC: 3.87 MIL/uL — ABNORMAL LOW (ref 4.22–5.81)
RDW: 13.2 % (ref 11.5–15.5)
WBC: 6.8 10*3/uL (ref 4.0–10.5)
nRBC: 0 % (ref 0.0–0.2)

## 2023-03-22 LAB — IRON AND TIBC
Iron: 105 ug/dL (ref 45–182)
Saturation Ratios: 32 % (ref 17.9–39.5)
TIBC: 325 ug/dL (ref 250–450)
UIBC: 220 ug/dL

## 2023-03-22 LAB — COMPREHENSIVE METABOLIC PANEL
ALT: 43 U/L (ref 0–44)
AST: 29 U/L (ref 15–41)
Albumin: 3.6 g/dL (ref 3.5–5.0)
Alkaline Phosphatase: 113 U/L (ref 38–126)
Anion gap: 10 (ref 5–15)
BUN: 35 mg/dL — ABNORMAL HIGH (ref 8–23)
CO2: 21 mmol/L — ABNORMAL LOW (ref 22–32)
Calcium: 9 mg/dL (ref 8.9–10.3)
Chloride: 106 mmol/L (ref 98–111)
Creatinine, Ser: 1.34 mg/dL — ABNORMAL HIGH (ref 0.61–1.24)
GFR, Estimated: 52 mL/min — ABNORMAL LOW (ref >60)
Glucose, Bld: 202 mg/dL — ABNORMAL HIGH (ref 70–99)
Potassium: 4.2 mmol/L (ref 3.5–5.1)
Sodium: 137 mmol/L (ref 135–145)
Total Bilirubin: 1.2 mg/dL (ref 0.3–1.2)
Total Protein: 7.2 g/dL (ref 6.5–8.1)

## 2023-03-22 LAB — FERRITIN: Ferritin: 96 ng/mL (ref 24–336)

## 2023-03-22 MED ORDER — GADOBUTROL 1 MMOL/ML IV SOLN
7.0000 mL | Freq: Once | INTRAVENOUS | Status: AC | PRN
  Administered 2023-03-22: 7 mL via INTRAVENOUS

## 2023-03-22 MED ORDER — IOHEXOL 300 MG/ML  SOLN
75.0000 mL | Freq: Once | INTRAMUSCULAR | Status: AC | PRN
  Administered 2023-03-22: 75 mL via INTRAVENOUS

## 2023-03-23 LAB — CEA: CEA: 11.6 ng/mL — ABNORMAL HIGH (ref 0.0–4.7)

## 2023-03-27 ENCOUNTER — Ambulatory Visit: Payer: Medicare Other | Admitting: Hematology

## 2023-03-27 NOTE — Progress Notes (Signed)
[facility] [address] [place] [address]    Clinic Day:  03/28/2023  Referring physician: [name], MD  Patient Care Team: Herbert Cosier, MD as PCP - General (Internal Medicine) Herbert Sarah, RN as Oncology Nurse Navigator (Medical Oncology) Herbert Massed, MD as Medical Oncologist (Medical Oncology)   ASSESSMENT & PLAN:   Assessment: 1. Stage II (T3N0) sigmoid colon cancer, MMR preserved: - Anemia with hemoglobin of 8, recent admission with diverticulitis.  He also had weight loss of 40 pounds in the last 6 months. - Colonoscopy by [facility] (01/10/2022): Fungating and ulcerated partially obstructing mass found at 42 cm proximal to the anus.  Mass is circumferential and measured 3 cm in length. - Pathology: Invasive moderately differentiated adenocarcinoma of the descending colon mass.  Transverse colon polypectomy shows fragments of at least intramucosal adenocarcinoma and high-grade dysplasia. - CT CAP (01/13/2022): No evidence of metastatic disease in the chest, abdomen or pelvis.  Several tiny lung nodules and scattered areas of interstitial irregularity in the right upper lobe likely postinflammatory. - Preoperative CEA (01/10/2022): 7.9. - Left hemicolectomy (02/17/2022): Moderately differentiated grade 2 adenocarcinoma, no perforation, no lymphovascular/perineural invasion, margins negative.  0/21 lymph nodes involved.  PT3 pN0. - PET scan on 11/09/2022: 3.2 x 2.3 cm right hepatic lobe metastasis. - MRI of the liver on 11/22/2022: Isolated inferior right hepatic lobe 2.8 cm. - Right lobe lesion thermal ablation on 12/27/2022.   2. Social/family history: - Lives at home with his wife.  Wife works at [employer] nephrology office in [place].  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  Quit smoking in 1995.  Smoked 1 to 2 packs/day for 25 years. - Son had DVT.  Son is also a carrier for factor V Leiden.  Mother  had bladder cancer and uterine cancer.   3.  Left leg DVT: - Patient had a left leg DVT approximately 2 years ago.  His wife thinks that DVT has happened about 4 to 6 weeks after his second COVID infection.  He was very minimally symptomatic from the COVID infection.  At the time of the DVT, he was completely mobile and did not have any surgery. - Patient was heterozygous for factor V Leiden as checked by Dr. Claude Wood on 09/20/2011. - I have recommended that he get the records of timing of DVT and the COVID test.  If it is within 4 to 6 weeks of his COVID diagnosis, we may safely discontinue anticoagulation considering it can be provoked by COVID infection.  If it is unprovoked DVT, may continue low intensity anticoagulation.   4.  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 - Adjuvant XRT 50 Gray, completed 08/11/2015.    Plan: 1.  Stage II (T3N0) sigmoid colon cancer: - Right lobe lesion thermal ablation on 12/27/2022. - He is doing very well. -Labs from 03/22/2023: Normal LFTs.  CEA is down to 11.6 from 20.9 in March. - CT chest on 03/22/2023: No evidence of metastatic disease.  Stable scattered small bilateral lung nodules. - MRI of the abdomen on 03/22/2023: Nonenhancing ablation defect of the inferior tip of the right lobe of the liver segment 6.  At the posterior aspect of this lesion there is some residual heterogeneously enhancing tissue corresponding in appearance and contour to the posterior aspect of the original lesion measuring about 1.5 x 0.9 cm.  This  may reflect post ablation local hyperemia.  No new liver lesions.  No evidence of lymphadenopathy. - Recommend follow-up in 3 months with repeat MRI and CT chest. - He has follow-up with Herbert Wood next week.   2.  Normocytic anemia: -Anemia from CKD and functional iron deficiency.  Hemoglobin today is 12.8.  Ferritin is 96.  Continue iron tablet  daily.   3.  Left leg DVT: -Continue Eliquis.  No bleeding issues.    Orders Placed This Encounter  Procedures   CT Chest W Contrast    Standing Status:   Future    Standing Expiration Date:   03/27/2024    Order Specific Question:   If indicated for the ordered procedure, I authorize the administration of contrast media per Radiology protocol    Answer:   Yes    Order Specific Question:   Does the patient have a contrast media/X-ray dye allergy?    Answer:   No    Order Specific Question:   Preferred imaging location?    Answer:   [facility]   MR Abdomen W Wo Contrast    Standing Status:   Future    Standing Expiration Date:   03/27/2024    Order Specific Question:   If indicated for the ordered procedure, I authorize the administration of contrast media per Radiology protocol    Answer:   Yes    Order Specific Question:   What is the patient's sedation requirement?    Answer:   No Sedation    Order Specific Question:   Does the patient have a pacemaker or implanted devices?    Answer:   No    Order Specific Question:   Preferred imaging location?    Answer:   [facility] (table limit - 550lbs)   CBC with Differential    Standing Status:   Future    Standing Expiration Date:   03/27/2024   Comprehensive metabolic panel    Standing Status:   Future    Standing Expiration Date:   03/27/2024   CEA    Standing Status:   Future    Standing Expiration Date:   03/27/2024   Iron and TIBC ([facility] ONLY)    Standing Status:   Future    Standing Expiration Date:   03/27/2024   Ferritin    Standing Status:   Future    Standing Expiration Date:   03/27/2024      I,Herbert Wood,acting as a scribe for Herbert Massed, MD.,have documented all relevant documentation on the behalf of Herbert Massed, MD,as directed by  Herbert Massed, MD while in the presence of Herbert Massed, MD.   I, Herbert Massed MD, have reviewed the  above documentation for accuracy and completeness, and I agree with the above.   Herbert Massed, MD   8/28/20246:10 PM  CHIEF COMPLAINT:   Diagnosis: sigmoid colon cancer and iron deficiency anemia    Cancer Staging  Cancer of sigmoid colon [facility]) Staging form: Colon and Rectum, AJCC 8th Edition - Clinical stage from 01/25/2022: Stage IIA (cT3, cN0, cM0) - Signed by Herbert Massed, MD on 03/16/2022    Prior Therapy: 1. Left hemicolectomy on 02/17/2022  2. Microwave ablation of liver metastasis, 12/27/22  Current Therapy:  Surveillance    HISTORY OF PRESENT ILLNESS:   Oncology History  Cancer of sigmoid colon (HCC)  01/25/2022 Initial Diagnosis   Cancer of sigmoid colon (HCC)   01/25/2022 Cancer Staging   Staging form: Colon  and Rectum, AJCC 8th Edition - Clinical stage from 01/25/2022: Stage IIA (cT3, cN0, cM0) - Signed by Herbert Massed, MD on 03/16/2022 Histopathologic type: Adenocarcinoma, NOS Stage prefix: Initial diagnosis Total positive nodes: 0 Total nodes examined: 21 Histologic grade (G): G2 Histologic grading system: 4 grade system Microsatellite instability (MSI): Stable      INTERVAL HISTORY:   Herbert Wood is a 88 y.o. male presenting to clinic today for follow up of sigmoid colon cancer and iron deficiency anemia. He was last seen by me on 11/23/22.  Since his last visit, he underwent restaging chest CT and abdomen MRI on 03/22/23.    Today, he states that he is doing well overall. His appetite level is at 100%. His energy level is at 100%.  PAST MEDICAL HISTORY:   Past Medical History: Past Medical History:  Diagnosis Date   Adenomatous colon polyp    Anemia    Carotid bruit    Chronic dyspnea    Diabetes (HCC)    type 2   Diverticular disease    DVT (deep venous thrombosis) (HCC)    lower limb   Dyspnea    Factor V deficiency (HCC)    Hearing loss    bilateral   Hyperlipemia    Hypertension    Inguinal hernia    Kidney disease     stage 3   Lung nodule    Lymphadenopathy    Malignant neoplasm (HCC)    salivary gland   Murmur    Osteoarthritis    Senile purpura (HCC)     Surgical History: Past Surgical History:  Procedure Laterality Date   BIOPSY  01/10/2022   Procedure: BIOPSY;  Surgeon: Dolores Frame, MD;  [facility]: [facility];  Service: Gastroenterology;;   COLONOSCOPY WITH PROPOFOL N/A 01/10/2022   Procedure: COLONOSCOPY WITH PROPOFOL;  Surgeon: Dolores Frame, MD;  Location: [facility];  Service: Gastroenterology;  Laterality: N/A;  per Soledad Gerlach, pt to arrive at 9:15   COLONOSCOPY WITH PROPOFOL N/A 02/27/2023   Procedure: COLONOSCOPY WITH PROPOFOL;  Surgeon: Dolores Frame, MD;  Location: [facility];  Service: Gastroenterology;  Laterality: N/A;  9:15am;asa 3   ESOPHAGOGASTRODUODENOSCOPY (EGD) WITH PROPOFOL N/A 01/10/2022   Procedure: ESOPHAGOGASTRODUODENOSCOPY (EGD) WITH PROPOFOL;  Surgeon: Dolores Frame, MD;  Location: [facility];  Service: Gastroenterology;  Laterality: N/A;   HAND / FINGER LESION EXCISION     HERNIA REPAIR     MASTOIDECTOMY     PILONIDAL CYST EXCISION     POLYPECTOMY  01/10/2022   Procedure: POLYPECTOMY;  Surgeon: Dolores Frame, MD;  Location: [facility];  Service: Gastroenterology;;   POLYPECTOMY  02/27/2023   Procedure: POLYPECTOMY;  Surgeon: Dolores Frame, MD;  Location: [facility];  Service: Gastroenterology;;   RADIOLOGY WITH ANESTHESIA N/A 12/27/2022   Procedure: CT microwave ablation of the liver;  Surgeon: Irish Lack, MD;  Location: [facility];  Service: Radiology;  Laterality: N/A;   SUBMUCOSAL TATTOO INJECTION  01/10/2022   Procedure: SUBMUCOSAL TATTOO INJECTION;  Surgeon: Dolores Frame, MD;  Location: [facility];  Service: Gastroenterology;;   SURGERY OF LIP     skin cancer    Social History: Social History   Socioeconomic History   Marital status: Married    Spouse  name: Not on file   Number of children: Not on file   Years of education: Not on file   Highest education level: Not on file  Occupational History  Not on file  Tobacco Use   Smoking status: Former    Current packs/day: 0.00    Average packs/day: 2.0 packs/day for 25.0 years (50.0 ttl pk-yrs)    Types: Cigarettes    Start date: 89    Quit date: 5    Years since quitting: 29.6    Passive exposure: Past   Smokeless tobacco: Never  Vaping Use   Vaping status: Never Used  Substance and Sexual Activity   Alcohol use: Never   Drug use: Never   Sexual activity: Not on file  Other Topics Concern   Not on file  Social History Narrative   Not on file   Social Determinants of Health   Financial Resource Strain: Not on file  Food Insecurity: No Food Insecurity (02/07/2023)   Hunger Vital Sign    Worried About Running Out of Food in the Last Year: Never true    Ran Out of Food in the Last Year: Never true  Transportation Needs: No Transportation Needs (02/07/2023)   PRAPARE - Administrator, Civil Service (Medical): No    Lack of Transportation (Non-Medical): No  Physical Activity: Not on file  Stress: Not on file  Social Connections: Not on file  Intimate Partner Violence: Not At Risk (02/07/2023)   Humiliation, Afraid, Rape, and Kick questionnaire    Fear of Current or Ex-Partner: No    Emotionally Abused: No    Physically Abused: No    Sexually Abused: No    Family History: No family history on file.  Current Medications:  Current Outpatient Medications:    amitriptyline (ELAVIL) 10 MG tablet, Take 10 mg by mouth every evening., Disp: , Rfl:    amLODipine (NORVASC) 5 MG tablet, Take 5 mg by mouth 2 (two) times daily., Disp: , Rfl:    ascorbic acid (VITAMIN C/NATURAL ROSE HIPS) 1000 MG tablet, Take 1,000 mg by mouth every evening., Disp: , Rfl:    atorvastatin (LIPITOR) 40 MG tablet, Take 40 mg by mouth every evening., Disp: , Rfl:    carvedilol (COREG)  6.25 MG tablet, Take 6.25 mg by mouth 2 (two) times daily with a meal., Disp: , Rfl:    Cholecalciferol (VITAMIN D3) 125 MCG (5000 UT) CAPS, Take 5,000 Units by mouth every evening., Disp: , Rfl:    dorzolamide (TRUSOPT) 2 % ophthalmic solution, Place 1 drop into the left eye 2 (two) times daily., Disp: , Rfl:    ELIQUIS 5 MG TABS tablet, TAKE 1 TABLET(5 MG) BY MOUTH TWICE DAILY, Disp: 60 tablet, Rfl: 3   ferrous sulfate 325 (65 FE) MG tablet, Take 325 mg by mouth in the morning. Iron Nature's Bounty, Disp: , Rfl:    folic acid (FOLVITE) 800 MCG tablet, Take 800 mcg by mouth every evening. With B12 Now (Brand), Disp: , Rfl:    hydrALAZINE (APRESOLINE) 25 MG tablet, Take 50 mg by mouth in the morning and at bedtime., Disp: , Rfl:    KERENDIA 10 MG TABS, Take 10 mg by mouth daily., Disp: , Rfl:    latanoprost (XALATAN) 0.005 % ophthalmic solution, Place 1 drop into both eyes at bedtime., Disp: , Rfl:    linagliptin (TRADJENTA) 5 MG TABS tablet, Take 5 mg by mouth in the morning., Disp: , Rfl:    loratadine (CLARITIN) 10 MG tablet, Take 10 mg by mouth every morning., Disp: , Rfl:    Misc Natural Products (PROSTATE SUPPORT PO), Take 1 capsule by mouth every evening. NOW Prostate  Support, Disp: , Rfl:    omeprazole (PRILOSEC) 40 MG capsule, Take 1 capsule (40 mg total) by mouth daily., Disp: 90 capsule, Rfl: 3   vitamin B-12 (CYANOCOBALAMIN) 1000 MCG tablet, Take 1,000 mcg by mouth every evening. Now (Brand), Disp: , Rfl:    albuterol (VENTOLIN HFA) 108 (90 Base) MCG/ACT inhaler, Inhale into the lungs every 6 (six) hours as needed for wheezing or shortness of breath. (Patient not taking: Reported on 03/28/2023), Disp: , Rfl:    LORazepam (ATIVAN) 0.5 MG tablet, Take 0.5 mg by mouth 2 (two) times daily as needed for anxiety. (Patient not taking: Reported on 03/28/2023), Disp: , Rfl:    sucralfate (CARAFATE) 1 GM/10ML suspension, Take 10 mLs (1 g total) by mouth 4 (four) times daily. (Patient not taking:  Reported on 03/28/2023), Disp: 420 mL, Rfl: 1   Allergies: Allergies  Allergen Reactions   Gadobenate Nausea And Vomiting    Immediately upon the infusion of 15mL multihance contrast  Patient had exteme nausea and vomiting.   No other symptoms noted .  No injury.  MRI scan was completed after patient felt better.   Immediately upon the infusion of 15mL multihance contrast  Patient had exteme nausea and vomiting.   No other symptoms noted .  No injury.  MRI scan was completed after patient felt better.       REVIEW OF SYSTEMS:   Review of Systems  Constitutional:  Negative for chills, fatigue and fever.  HENT:   Negative for lump/mass, mouth sores, nosebleeds, sore throat and trouble swallowing.   Eyes:  Negative for eye problems.  Respiratory:  Negative for cough and shortness of breath.   Cardiovascular:  Negative for chest pain, leg swelling and palpitations.  Gastrointestinal:  Negative for abdominal pain, constipation, diarrhea, nausea and vomiting.  Genitourinary:  Negative for bladder incontinence, difficulty urinating, dysuria, frequency, hematuria and nocturia.   Musculoskeletal:  Negative for arthralgias, back pain, flank pain, myalgias and neck pain.  Skin:  Negative for itching and rash.  Neurological:  Positive for dizziness and numbness. Negative for headaches.  Hematological:  Does not bruise/bleed easily.  Psychiatric/Behavioral:  Negative for depression, sleep disturbance and suicidal ideas. The patient is not nervous/anxious.   All other systems reviewed and are negative.    VITALS:   Blood pressure (!) 141/68, pulse (!) 53, temperature (!) 97.4 F (36.3 C), temperature source Oral, resp. rate 16, weight 212 lb 3.2 oz (96.3 kg), SpO2 95%.  Wt Readings from Last 3 Encounters:  03/28/23 212 lb 3.2 oz (96.3 kg)  02/26/23 208 lb 1.8 oz (94.4 kg)  02/07/23 208 lb 1.8 oz (94.4 kg)    Body mass index is 30.45 kg/m.  Performance status (ECOG): 1 - Symptomatic but  completely ambulatory  PHYSICAL EXAM:   Physical Exam Vitals and nursing note reviewed. Exam conducted with a chaperone present.  Constitutional:      Appearance: Normal appearance.  Cardiovascular:     Rate and Rhythm: Normal rate and regular rhythm.     Pulses: Normal pulses.     Heart sounds: Normal heart sounds.  Pulmonary:     Effort: Pulmonary effort is normal.     Breath sounds: Normal breath sounds.  Abdominal:     Palpations: Abdomen is soft. There is no hepatomegaly, splenomegaly or mass.     Tenderness: There is no abdominal tenderness.  Musculoskeletal:     Right lower leg: No edema.     Left lower leg: No edema.  Lymphadenopathy:     Cervical: No cervical adenopathy.     Right cervical: No superficial, deep or posterior cervical adenopathy.    Left cervical: No superficial, deep or posterior cervical adenopathy.     Upper Body:     Right upper body: No supraclavicular or axillary adenopathy.     Left upper body: No supraclavicular or axillary adenopathy.  Neurological:     General: No focal deficit present.     Mental Status: He is alert and oriented to person, place, and time.  Psychiatric:        Mood and Affect: Mood normal.        Behavior: Behavior normal.     LABS:      Latest Ref Rng & Units 03/22/2023    8:09 AM 02/08/2023    4:27 AM 02/07/2023    1:37 PM  CBC  WBC 4.0 - 10.5 K/uL 6.8  9.7  16.3   Hemoglobin 13.0 - 17.0 g/dL 45.4  09.8  11.9   Hematocrit 39.0 - 52.0 % 37.7  30.3  35.8   Platelets 150 - 400 K/uL 203  135  150       Latest Ref Rng & Units 03/22/2023    8:09 AM 02/08/2023    4:27 AM 02/07/2023    2:28 PM  CMP  Glucose 70 - 99 mg/dL 147  87  829   BUN 8 - 23 mg/dL 35  51  50   Creatinine 0.61 - 1.24 mg/dL 5.62  1.30  8.65   Sodium 135 - 145 mmol/L 137  133  132   Potassium 3.5 - 5.1 mmol/L 4.2  3.2  3.9   Chloride 98 - 111 mmol/L 106  104  100   CO2 22 - 32 mmol/L 21  21  22    Calcium 8.9 - 10.3 mg/dL 9.0  8.2  8.3   Total  Protein 6.5 - 8.1 g/dL 7.2   5.8   Total Bilirubin 0.3 - 1.2 mg/dL 1.2   2.2   Alkaline Phos 38 - 126 U/L 113   87   AST 15 - 41 U/L 29   25   ALT 0 - 44 U/L 43   48      Lab Results  Component Value Date   CEA1 11.6 (H) 03/22/2023   /  CEA  Date Value Ref Range Status  03/22/2023 11.6 (H) 0.0 - 4.7 ng/mL Final    Comment:    (NOTE)                             Nonsmokers          <3.9                             Smokers             <5.6 Roche [employer] Electrochemiluminescence Immunoassay (ECLIA) Values obtained with different assay methods or kits cannot be used interchangeably.  Results cannot be interpreted as absolute evidence of the presence or absence of malignant disease. Performed At: [facility] [address] [place], [place] [id number] Jolene Schimke MD MW:[id number]    No results found for: "PSA1" No results found for: "CAN199" No results found for: "CAN125"  Lab Results  Component Value Date   TOTALPROTELP 6.3 01/25/2022   ALBUMINELP 3.6 01/25/2022   A1GS 0.2 01/25/2022   A2GS 0.7  01/25/2022   BETS 0.9 01/25/2022   GAMS 0.9 01/25/2022   MSPIKE Not Observed 01/25/2022   SPEI Comment 01/25/2022   Lab Results  Component Value Date   TIBC 325 03/22/2023   TIBC 331 10/19/2022   TIBC 396 01/25/2022   FERRITIN 96 03/22/2023   FERRITIN 53 10/19/2022   FERRITIN 17 (L) 01/25/2022   IRONPCTSAT 32 03/22/2023   IRONPCTSAT 52 (H) 10/19/2022   IRONPCTSAT 53 (H) 01/25/2022   No results found for: "LDH"   STUDIES:   MR Abdomen W Wo Contrast  Result Date: 03/28/2023 CLINICAL DATA:  Colon cancer, status post percutaneous ablation of a right lobe liver metastasis EXAM: MRI ABDOMEN WITHOUT AND WITH CONTRAST TECHNIQUE: Multiplanar multisequence MR imaging of the abdomen was performed both before and after the administration of intravenous contrast. CONTRAST:  7mL GADAVIST GADOBUTROL 1 MMOL/ML IV SOLN COMPARISON:  MR abdomen, 11/22/2022 FINDINGS: Lower  chest: No acute abnormality. Hepatobiliary: Nonenhancing ablation defect of the inferior tip of the right lobe of the liver, hepatic segment VI, generally corresponding to mass seen on prior examination. At the posterior aspect of this lesion there is however some residual heterogeneously enhancing tissue which corresponds in appearance and contour to the posterior aspect of the originally identified lesion, this vicinity measuring 1.5 x 0.9 cm (series 14, image 58). No new liver lesions. No gallstones, gallbladder wall thickening, or biliary dilatation. Pancreas: Unremarkable. No pancreatic ductal dilatation or surrounding inflammatory changes. Spleen: Normal in size without significant abnormality. Adrenals/Urinary Tract: Adrenal glands are unremarkable. Numerous simple and thinly septated benign bilateral renal cortical and parapelvic cysts, requiring no specific further follow-up or characterization. Kidneys are otherwise normal, without obvious renal calculi, solid lesion, or hydronephrosis. Stomach/Bowel: Stomach is within normal limits. No evidence of bowel wall thickening, distention, or inflammatory changes. Vascular/Lymphatic: Aortic atherosclerosis. No enlarged abdominal lymph nodes. Other: No abdominal wall hernia or abnormality. No ascites. Musculoskeletal: No acute or significant osseous findings. IMPRESSION: 1. Nonenhancing ablation defect of the inferior tip of the right lobe of the liver, hepatic segment VI, generally corresponding to mass seen on prior examination. At the posterior aspect of this lesion there is however some residual heterogeneously enhancing tissue which corresponds in appearance and contour to the posterior aspect of the originally identified lesion, this vicinity measuring 1.5 x 0.9 cm. This may reflect post ablation local hyperemia but is concerning for residual or recurrent viable tumor and warrants close attention on follow-up. 2. No new liver lesions. 3. No evidence of  lymphadenopathy or metastatic disease in the abdomen. Aortic Atherosclerosis (ICD10-I70.0). Electronically Signed   By: Jearld Lesch M.D.   On: 03/28/2023 11:59   CT Chest W Contrast  Result Date: 03/28/2023 CLINICAL DATA:  Colon cancer staging EXAM: CT CHEST WITH CONTRAST TECHNIQUE: Multidetector CT imaging of the chest was performed during intravenous contrast administration. RADIATION DOSE REDUCTION: This exam was performed according to the departmental dose-optimization program which includes automated exposure control, adjustment of the mA and/or kV according to patient size and/or use of iterative reconstruction technique. CONTRAST:  75mL OMNIPAQUE IOHEXOL 300 MG/ML  SOLN COMPARISON:  PET-CT dated 11/09/2022.  CT chest dated 07/13/2022. FINDINGS: Cardiovascular: Heart is normal in size.  No pericardial effusion. No evidence thoracic aortic aneurysm. Atherosclerotic calcifications of the aortic root/arch. Mild 3 vessel coronary atherosclerosis. Mediastinum/Nodes: No suspicious mediastinal lymphadenopathy. Visualized thyroid is unremarkable. Lungs/Pleura: Mild biapical pleural-parenchymal scarring. Mild centrilobular and paraseptal emphysematous changes, upper lung predominant. Stable scattered small bilateral pulmonary nodules, most of which  are subpleural. Dominant nodules include: --8 x 4 mm nodule in the lateral right upper lobe (series 3/image 36) --6 mm mixed density/cystic nodule in the central right upper lobe (series 3/image 46) --4 x 5 mm nodule in the right lower lobe (series 3/image 101) --7 x 3 mm subpleural nodule in the right lower lobe (series 3/image 110) No focal consolidation. No pleural effusion or pneumothorax. Upper Abdomen: Visualized upper abdomen is better evaluated on concurrent MRI abdomen. Musculoskeletal: Degenerative changes of the visualized thoracolumbar spine. IMPRESSION: No evidence of metastatic disease in the chest. Stable scattered small bilateral pulmonary nodules, as  above, likely benign. Aortic Atherosclerosis (ICD10-I70.0) and Emphysema (ICD10-J43.9). Electronically Signed   By: Charline Bills M.D.   On: 03/28/2023 11:58

## 2023-03-28 ENCOUNTER — Inpatient Hospital Stay (HOSPITAL_BASED_OUTPATIENT_CLINIC_OR_DEPARTMENT_OTHER): Payer: Medicare Other | Admitting: Hematology

## 2023-03-28 VITALS — BP 141/68 | HR 53 | Temp 97.4°F | Resp 16 | Wt 212.2 lb

## 2023-03-28 DIAGNOSIS — C787 Secondary malignant neoplasm of liver and intrahepatic bile duct: Secondary | ICD-10-CM | POA: Diagnosis not present

## 2023-03-28 DIAGNOSIS — Z7901 Long term (current) use of anticoagulants: Secondary | ICD-10-CM | POA: Diagnosis not present

## 2023-03-28 DIAGNOSIS — K769 Liver disease, unspecified: Secondary | ICD-10-CM | POA: Diagnosis not present

## 2023-03-28 DIAGNOSIS — Z86718 Personal history of other venous thrombosis and embolism: Secondary | ICD-10-CM | POA: Diagnosis not present

## 2023-03-28 DIAGNOSIS — D509 Iron deficiency anemia, unspecified: Secondary | ICD-10-CM

## 2023-03-28 DIAGNOSIS — N189 Chronic kidney disease, unspecified: Secondary | ICD-10-CM | POA: Diagnosis not present

## 2023-03-28 DIAGNOSIS — D631 Anemia in chronic kidney disease: Secondary | ICD-10-CM | POA: Diagnosis not present

## 2023-03-28 DIAGNOSIS — C187 Malignant neoplasm of sigmoid colon: Secondary | ICD-10-CM | POA: Diagnosis present

## 2023-03-28 NOTE — Patient Instructions (Signed)
[facility] at [facility] Discharge Instructions   You were seen and examined today by Dr. Ellin Saba.  He reviewed the results of your lab work which are normal/stable.   The results of the MRI and CT are pending. We will call you with the results and determine follow-up once we see those results.   Return as scheduled.    Thank you for choosing [facility] at [facility] to provide your oncology and hematology care.  To afford each patient quality time with our provider, please arrive at least 15 minutes before your scheduled appointment time.   If you have a lab appointment with the Cancer Center please come in thru the Main Entrance and check in at the main information desk.  You need to re-schedule your appointment should you arrive 10 or more minutes late.  We strive to give you quality time with our providers, and arriving late affects you and other patients whose appointments are after yours.  Also, if you no show three or more times for appointments you may be dismissed from the clinic at the providers discretion.     Again, thank you for choosing [facility].  Our hope is that these requests will decrease the amount of time that you wait before being seen by our physicians.       _____________________________________________________________  Should you have questions after your visit to [facility], please contact our office at [phone] and follow the prompts.  Our office hours are 8:00 a.m. and 4:30 p.m. Monday - Friday.  Please note that voicemails left after 4:00 p.m. may not be returned until the following business day.  We are closed weekends and major holidays.  You do have access to a nurse 24-7, just call the main number to the clinic [phone] and do not press any options, hold on the line and a nurse will answer the phone.    For prescription refill requests, have your pharmacy contact our  office and allow 72 hours.    Due to Covid, you will need to wear a mask upon entering the hospital. If you do not have a mask, a mask will be given to you at the Main Entrance upon arrival. For doctor visits, patients may have 1 support person age 75 or older with them. For treatment visits, patients can not have anyone with them due to social distancing guidelines and our immunocompromised population.

## 2023-04-04 ENCOUNTER — Ambulatory Visit
Admission: RE | Admit: 2023-04-04 | Discharge: 2023-04-04 | Disposition: A | Discharge status: Home / Self Care | Payer: Medicare Other | Source: Ambulatory Visit | Attending: Interventional Radiology | Admitting: Interventional Radiology

## 2023-04-04 DIAGNOSIS — C189 Malignant neoplasm of colon, unspecified: Secondary | ICD-10-CM

## 2023-04-04 HISTORY — PX: IR RADIOLOGIST EVAL & MGMT: IMG5224

## 2023-04-04 NOTE — Progress Notes (Signed)
Chief Complaint: Patient was consulted remotely today (TeleHealth) for follow-up after microwave ablation of a colorectal liver metastasis on 12/27/2022.   History of Present Illness: Herbert Wood is a 88 y.o. 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  He underwent percutaneous microwave thermal ablation of the lesion on 12/27/2022.  At the time of the procedure the lesion measured approximately 2.5 x 3.4 cm by unenhanced CT.  The procedure was uncomplicated and the patient was observed overnight and sent home the next day. He has fully recovered from the procedure.  He underwent colonoscopy on 02/27/23 by Dr. [name]. Three polyps were removed and the colo-colonic anastomosis biopsied. All pathology was negative for dysplasia or carcinoma. CEA after ablation did decrease to 11.6 from 20.9 but has not come down to the nadir of 3.9 one year ago. A follow up MRI was performed on 8/22.2024. CT of the chest on 03/22/23 showed stable small pulmonary nodules felt most likely to be benign.   Past Medical History:  Diagnosis Date   Adenomatous colon polyp    Anemia    Carotid bruit    Chronic dyspnea    Diabetes (HCC)    type 2   Diverticular disease    DVT (deep venous thrombosis) (HCC)    lower limb   Dyspnea    Factor V deficiency (HCC)    Hearing loss    bilateral   Hyperlipemia    Hypertension    Inguinal hernia    Kidney disease    stage 3   Lung nodule    Lymphadenopathy    Malignant neoplasm (HCC)    salivary gland   Murmur    Osteoarthritis    Senile purpura (HCC)     Past Surgical History:  Procedure Laterality Date   BIOPSY  01/10/2022   Procedure: BIOPSY;  Surgeon: Dolores Frame, MD;  [facility];  Service: Gastroenterology;;   COLONOSCOPY WITH PROPOFOL N/A 01/10/2022   Procedure: COLONOSCOPY WITH PROPOFOL;  Surgeon: Dolores Frame, MD;  [facility]: [facility];  Service: Gastroenterology;  Laterality: N/A;  per Soledad Gerlach, pt to arrive at 9:15   COLONOSCOPY WITH PROPOFOL N/A 02/27/2023   Procedure: COLONOSCOPY WITH PROPOFOL;  Surgeon: Dolores Frame, MD;  [facility]: [facility];  Service: Gastroenterology;  Laterality: N/A;  9:15am;asa 3   ESOPHAGOGASTRODUODENOSCOPY (EGD) WITH PROPOFOL N/A 01/10/2022   Procedure: ESOPHAGOGASTRODUODENOSCOPY (EGD) WITH PROPOFOL;  Surgeon: Dolores Frame, MD;  [facility];  Service: Gastroenterology;  Laterality: N/A;   HAND / FINGER LESION EXCISION     HERNIA REPAIR     MASTOIDECTOMY     PILONIDAL CYST EXCISION     POLYPECTOMY  01/10/2022   Procedure: POLYPECTOMY;  Surgeon: Dolores Frame, MD;  [facility]: [facility];  Service: Gastroenterology;;   POLYPECTOMY  02/27/2023   Procedure: POLYPECTOMY;  Surgeon: Dolores Frame, MD;  [facility];  Service: Gastroenterology;;   RADIOLOGY WITH ANESTHESIA N/A 12/27/2022   Procedure: CT microwave ablation of the liver;  Surgeon: Irish Lack, MD;  Location: [facility];  Service: Radiology;  Laterality: N/A;   SUBMUCOSAL TATTOO INJECTION  01/10/2022   Procedure: SUBMUCOSAL TATTOO INJECTION;  Surgeon: Dolores Frame, MD;  [facility]: [facility];  Service: Gastroenterology;;   SURGERY OF LIP     skin cancer    Allergies: Gadobenate  Medications: Prior to Admission medications   Medication Sig Start Date End Date Taking? Authorizing Provider  albuterol (VENTOLIN HFA) 108 (90 Base) MCG/ACT inhaler Inhale into the lungs every 6 (six) hours as needed for wheezing or shortness of breath. Patient not taking: Reported on 03/28/2023    [provider]  amitriptyline (ELAVIL) 10 MG tablet Take 10 mg by mouth every evening.    [provider]  amLODipine (NORVASC) 5 MG tablet Take 5 mg by mouth 2 (two) times daily.    [provider]   ascorbic acid (VITAMIN C/NATURAL ROSE HIPS) 1000 MG tablet Take 1,000 mg by mouth every evening.    [provider]  atorvastatin (LIPITOR) 40 MG tablet Take 40 mg by mouth every evening.    [provider]  carvedilol (COREG) 6.25 MG tablet Take 6.25 mg by mouth 2 (two) times daily with a meal.    [provider]  Cholecalciferol (VITAMIN D3) 125 MCG (5000 UT) CAPS Take 5,000 Units by mouth every evening.    [provider]  dorzolamide (TRUSOPT) 2 % ophthalmic solution Place 1 drop into the left eye 2 (two) times daily.    [provider]  ELIQUIS 5 MG TABS tablet TAKE 1 TABLET(5 MG) BY MOUTH TWICE DAILY 02/12/23   [name], MD  ferrous sulfate 325 (65 FE) MG tablet Take 325 mg by mouth in the morning. Iron Nature's Bounty    [provider]  folic acid (FOLVITE) 800 MCG tablet Take 800 mcg by mouth every evening. With B12 Now (Brand)    [provider]  hydrALAZINE (APRESOLINE) 25 MG tablet Take 50 mg by mouth in the morning and at bedtime.    [provider]  KERENDIA 10 MG TABS Take 10 mg by mouth daily.    [provider]  latanoprost (XALATAN) 0.005 % ophthalmic solution Place 1 drop into both eyes at bedtime.    [provider]  linagliptin (TRADJENTA) 5 MG TABS tablet Take 5 mg by mouth in the morning.    [provider]  loratadine (CLARITIN) 10 MG tablet Take 10 mg by mouth every morning.    [provider]  LORazepam (ATIVAN) 0.5 MG tablet Take 0.5 mg by mouth 2 (two) times daily as needed for anxiety. Patient not taking: Reported on 03/28/2023    [provider]  Misc Natural Products (PROSTATE SUPPORT PO) Take 1 capsule by mouth every evening. NOW Prostate Support    [provider]  omeprazole (PRILOSEC) 40 MG capsule Take 1 capsule (40 mg total) by mouth daily. 01/10/22   Dolores Frame, MD  sucralfate (CARAFATE) 1 GM/10ML suspension  Take 10 mLs (1 g total) by mouth 4 (four) times daily. Patient not taking: Reported on 03/28/2023 02/05/23   Raquel James, NP  vitamin B-12 (CYANOCOBALAMIN) 1000 MCG tablet Take 1,000 mcg by mouth every evening. Now Charlsie Quest)    [provider]     No family history on file.  Social History   Socioeconomic History   Marital status: Married    Spouse name: Not on file   Number of children: Not on file   Years of education: Not on file   Highest education level: Not on file  Occupational History   Not on file  Tobacco Use   Smoking status: Former    Current packs/day: 0.00    Average packs/day: 2.0 packs/day for 25.0 years (50.0 ttl pk-yrs)    Types: Cigarettes  Start date: 41    Quit date: 18    Years since quitting: 29.6    Passive exposure: Past   Smokeless tobacco: Never  Vaping Use   Vaping status: Never Used  Substance and Sexual Activity   Alcohol use: Never   Drug use: Never   Sexual activity: Not on file  Other Topics Concern   Not on file  Social History Narrative   Not on file   Social Determinants of Health   Financial Resource Strain: Not on file  Food Insecurity: No Food Insecurity (02/07/2023)   Hunger Vital Sign    Worried About Running Out of Food in the Last Year: Never true    Ran Out of Food in the Last Year: Never true  Transportation Needs: No Transportation Needs (02/07/2023)   PRAPARE - Administrator, Civil Service (Medical): No    Lack of Transportation (Non-Medical): No  Physical Activity: Not on file  Stress: Not on file  Social Connections: Not on file    ECOG Status: 0 - Asymptomatic  Review of Systems  Constitutional: Negative.   Respiratory: Negative.    Cardiovascular: Negative.   Gastrointestinal: Negative.   Genitourinary: Negative.   Musculoskeletal: Negative.   Neurological: Negative.     Review of Systems: A 12 point ROS discussed and pertinent positives are indicated in the HPI above.  All  other systems are negative.   Physical Exam No direct physical exam was performed (except for noted visual exam findings with Video Visits).   Vital Signs: There were no vitals taken for this visit.  Imaging: MR Abdomen W Wo Contrast  Result Date: 03/28/2023 CLINICAL DATA:  Colon cancer, status post percutaneous ablation of a right lobe liver metastasis EXAM: MRI ABDOMEN WITHOUT AND WITH CONTRAST TECHNIQUE: Multiplanar multisequence MR imaging of the abdomen was performed both before and after the administration of intravenous contrast. CONTRAST:  7mL GADAVIST GADOBUTROL 1 MMOL/ML IV SOLN COMPARISON:  MR abdomen, 11/22/2022 FINDINGS: Lower chest: No acute abnormality. Hepatobiliary: Nonenhancing ablation defect of the inferior tip of the right lobe of the liver, hepatic segment VI, generally corresponding to mass seen on prior examination. At the posterior aspect of this lesion there is however some residual heterogeneously enhancing tissue which corresponds in appearance and contour to the posterior aspect of the originally identified lesion, this vicinity measuring 1.5 x 0.9 cm (series 14, image 58). No new liver lesions. No gallstones, gallbladder wall thickening, or biliary dilatation. Pancreas: Unremarkable. No pancreatic ductal dilatation or surrounding inflammatory changes. Spleen: Normal in size without significant abnormality. Adrenals/Urinary Tract: Adrenal glands are unremarkable. Numerous simple and thinly septated benign bilateral renal cortical and parapelvic cysts, requiring no specific further follow-up or characterization. Kidneys are otherwise normal, without obvious renal calculi, solid lesion, or hydronephrosis. Stomach/Bowel: Stomach is within normal limits. No evidence of bowel wall thickening, distention, or inflammatory changes. Vascular/Lymphatic: Aortic atherosclerosis. No enlarged abdominal lymph nodes. Other: No abdominal wall hernia or abnormality. No ascites.  Musculoskeletal: No acute or significant osseous findings. IMPRESSION: 1. Nonenhancing ablation defect of the inferior tip of the right lobe of the liver, hepatic segment VI, generally corresponding to mass seen on prior examination. At the posterior aspect of this lesion there is however some residual heterogeneously enhancing tissue which corresponds in appearance and contour to the posterior aspect of the originally identified lesion, this vicinity measuring 1.5 x 0.9 cm. This may reflect post ablation local hyperemia but is concerning for residual or recurrent viable tumor  and warrants close attention on follow-up. 2. No new liver lesions. 3. No evidence of lymphadenopathy or metastatic disease in the abdomen. Aortic Atherosclerosis (ICD10-I70.0). Electronically Signed   By: Jearld Lesch M.D.   On: 03/28/2023 11:59   CT Chest W Contrast  Result Date: 03/28/2023 CLINICAL DATA:  Colon cancer staging EXAM: CT CHEST WITH CONTRAST TECHNIQUE: Multidetector CT imaging of the chest was performed during intravenous contrast administration. RADIATION DOSE REDUCTION: This exam was performed according to the departmental dose-optimization program which includes automated exposure control, adjustment of the mA and/or kV according to patient size and/or use of iterative reconstruction technique. CONTRAST:  75mL OMNIPAQUE IOHEXOL 300 MG/ML  SOLN COMPARISON:  PET-CT dated 11/09/2022.  CT chest dated 07/13/2022. FINDINGS: Cardiovascular: Heart is normal in size.  No pericardial effusion. No evidence thoracic aortic aneurysm. Atherosclerotic calcifications of the aortic root/arch. Mild 3 vessel coronary atherosclerosis. Mediastinum/Nodes: No suspicious mediastinal lymphadenopathy. Visualized thyroid is unremarkable. Lungs/Pleura: Mild biapical pleural-parenchymal scarring. Mild centrilobular and paraseptal emphysematous changes, upper lung predominant. Stable scattered small bilateral pulmonary nodules, most of which are  subpleural. Dominant nodules include: --8 x 4 mm nodule in the lateral right upper lobe (series 3/image 36) --6 mm mixed density/cystic nodule in the central right upper lobe (series 3/image 46) --4 x 5 mm nodule in the right lower lobe (series 3/image 101) --7 x 3 mm subpleural nodule in the right lower lobe (series 3/image 110) No focal consolidation. No pleural effusion or pneumothorax. Upper Abdomen: Visualized upper abdomen is better evaluated on concurrent MRI abdomen. Musculoskeletal: Degenerative changes of the visualized thoracolumbar spine. IMPRESSION: No evidence of metastatic disease in the chest. Stable scattered small bilateral pulmonary nodules, as above, likely benign. Aortic Atherosclerosis (ICD10-I70.0) and Emphysema (ICD10-J43.9). Electronically Signed   By: Charline Bills M.D.   On: 03/28/2023 11:58    Labs:  CBC: Recent Labs    12/28/22 0557 02/07/23 1337 02/08/23 0427 03/22/23 0809  WBC 12.7* 16.3* 9.7 6.8  HGB 10.8* 12.2* 10.4* 12.8*  HCT 32.3* 35.8* 30.3* 37.7*  PLT 164 150 135* 203    COAGS: Recent Labs    12/27/22 0830 02/07/23 1352  INR 1.1 1.8*    BMP: Recent Labs    12/27/22 0830 02/07/23 1428 02/08/23 0427 03/22/23 0809  NA 140 132* 133* 137  K 3.5 3.9 3.2* 4.2  CL 101 100 104 106  CO2 28 22 21* 21*  GLUCOSE 138* 107* 87 202*  BUN 33* 50* 51* 35*  CALCIUM 8.5* 8.3* 8.2* 9.0  CREATININE 1.50* 2.33* 1.92* 1.34*  GFRNONAA 45* 27* 34* 52*    LIVER FUNCTION TESTS: Recent Labs    10/19/22 1402 12/27/22 0830 02/07/23 1428 03/22/23 0809  BILITOT 1.3* 2.7* 2.2* 1.2  AST 28 22 25 29   ALT 36 23 48* 43  ALKPHOS 114 86 87 113  PROT 6.9 6.9 5.8* 7.2  ALBUMIN 3.6 3.8 2.8* 3.6    TUMOR MARKERS: CEA 11.6 on 03/22/23  CEA 20.9 on 10/19/22   Assessment and Plan:  I spoke with Herbert Wood by phone. I reviewed MRI results with him that demonstrate a well circumscribed ablation defect in the inferior tip of the right lobe of the liver  corresponding to the treated metastasis. There is some enhancement along the posterior aspect of the ablation zone, which may represent residual viable tissue versus hyperemia and enhancement related to coagulative necrosis, but given that the CEA did not drop to more significantly, this should be followed closely with another  MRI. I told Herbert Wood that should a nodular area of enhancement be persistent or increase in size, we could discuss the possibility of a second ablation treatment, which should be more straightforward. At his age, I would prefer not to put him through a second procedure, if we could avoid it. I recommended following his CEA and performing a follow up MRI in 3-6 months. I will message Dr. Ellin Saba.   Electronically Signed: Reola Calkins 04/04/2023, 11:28 AM    I spent a total of 15 Minutes in remote  clinical consultation, greater than 50% of which was counseling/coordinating care post ablation of a liver metastasis.    Visit type: Audio only (telephone). Audio (no video) only due to patient's lack of internet/smartphone capability. Alternative for in-person consultation at [facility], [address] [place], [place], [place]. This visit type was conducted due to national recommendations for restrictions regarding the COVID-19 Pandemic (e.g. social distancing).  This format is felt to be most appropriate for this patient at this time.  All issues noted in this document were discussed and addressed.

## 2023-05-19 IMAGING — CT CT CHEST-ABD-PELV W/ CM
2 of 5 series · 13 of 36 positions shown, 15 images · IV contrast (Omnipaque or Isovue)
Comparison: CT abdomen pelvis dated 11/01/2021.

CLINICAL DATA: Colon cancer staging.

EXAM:
CT CHEST, ABDOMEN, AND PELVIS WITH CONTRAST
TECHNIQUE: Multidetector CT imaging of the chest, abdomen and pelvis was
performed following the standard protocol during bolus
administration of intravenous contrast.

[Series 2: cap with · axial · 0.77mm/px · z∈[+853,+1428]mm · 10 of 141 slices shown, 12 images]
[im 13/141  mediastinal]
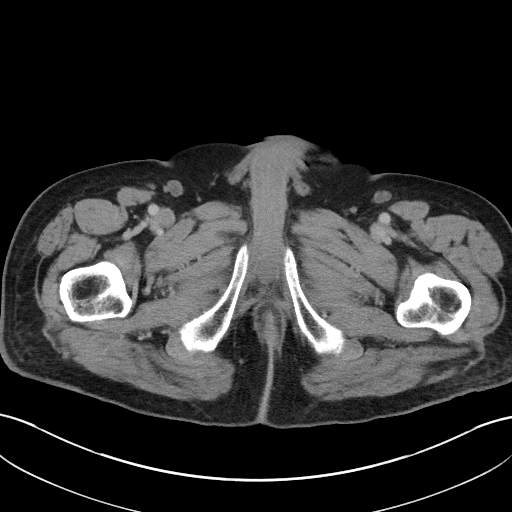
[im 13/141  bone]
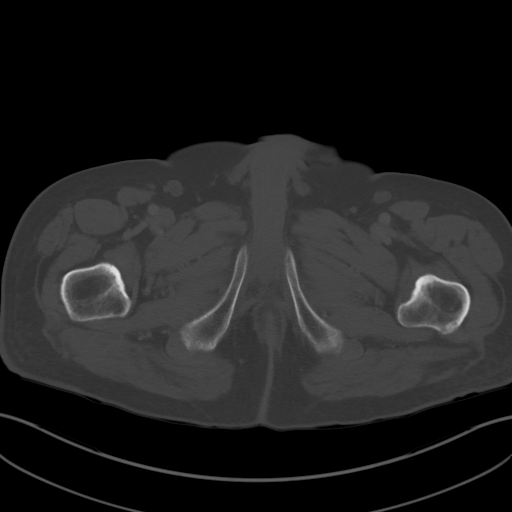
[im 26/141  mediastinal]
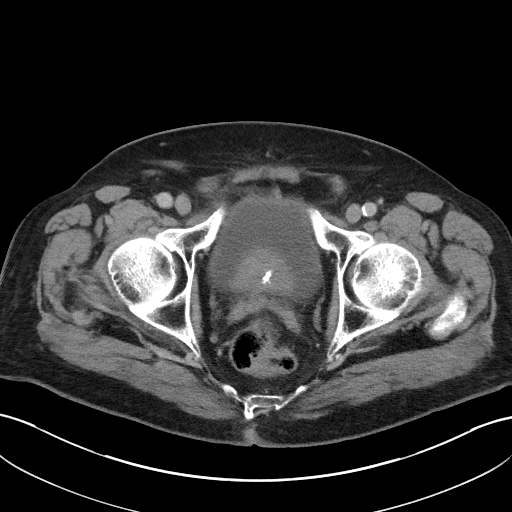
[im 39/141  mediastinal]
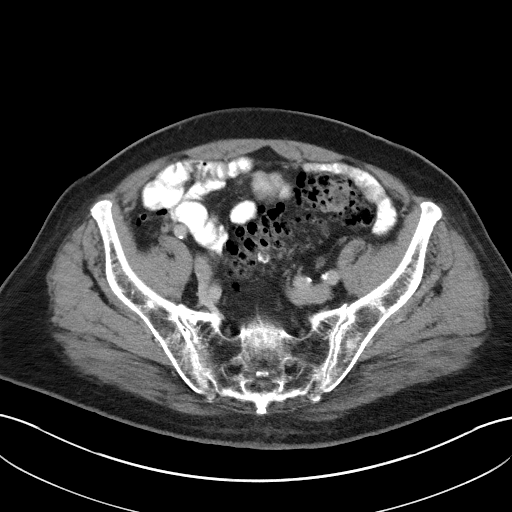
[im 51/141  mediastinal]
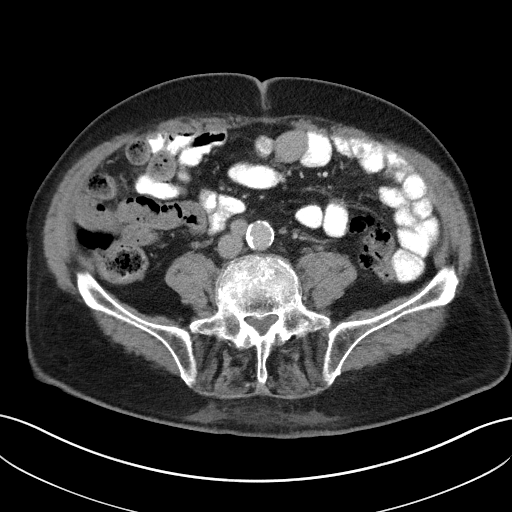
[im 64/141  mediastinal]
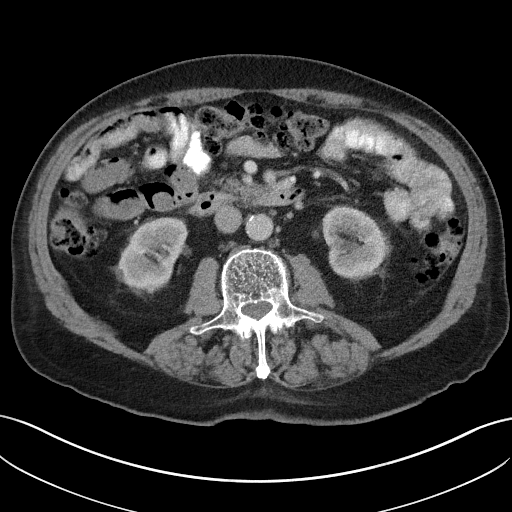
[im 77/141  mediastinal]
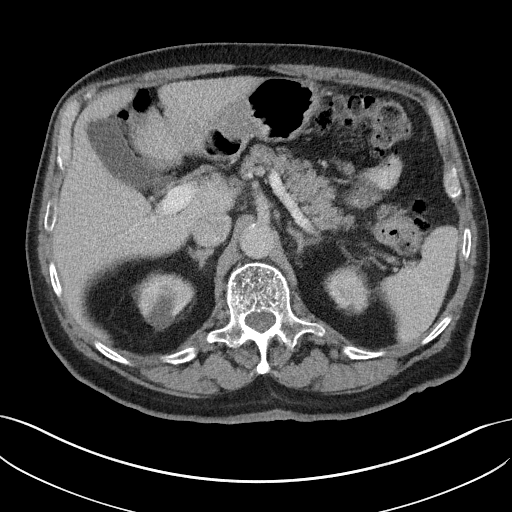
[im 90/141  mediastinal]
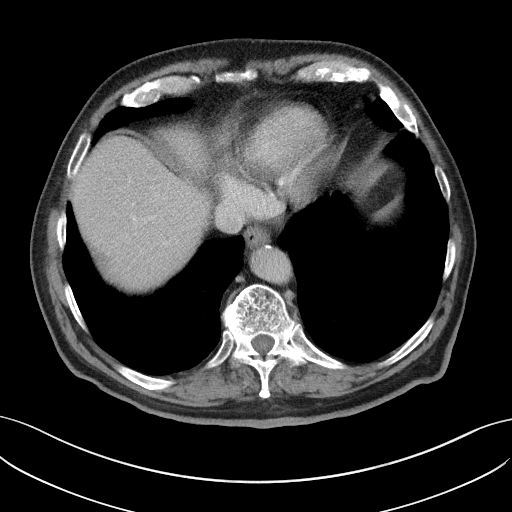
[im 102/141  mediastinal]
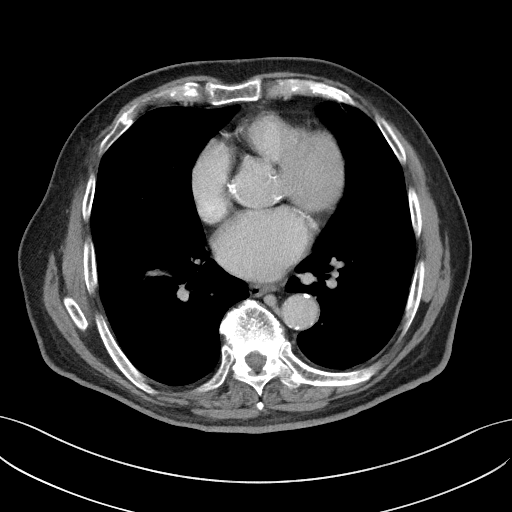
[im 115/141  mediastinal]
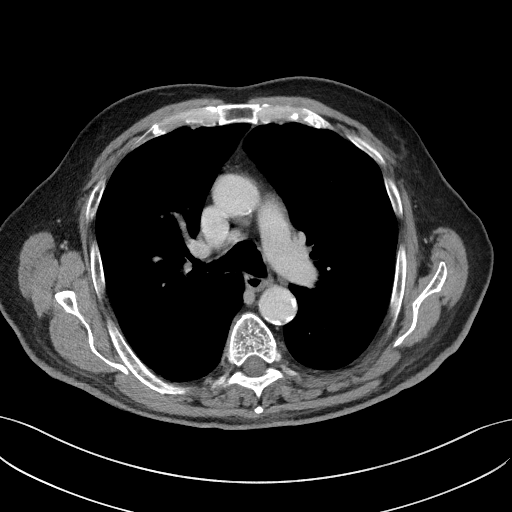
[im 115/141  bone]
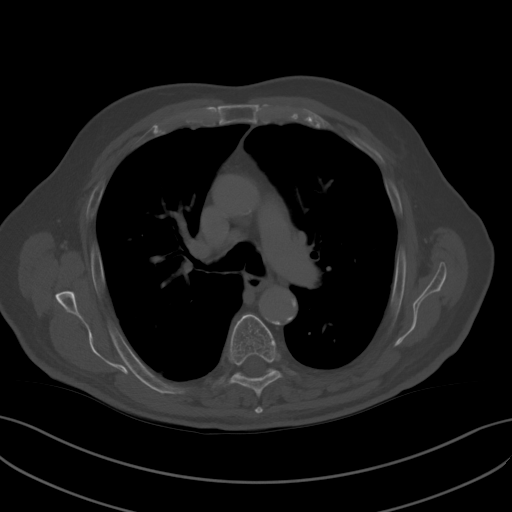
[im 128/141  mediastinal]
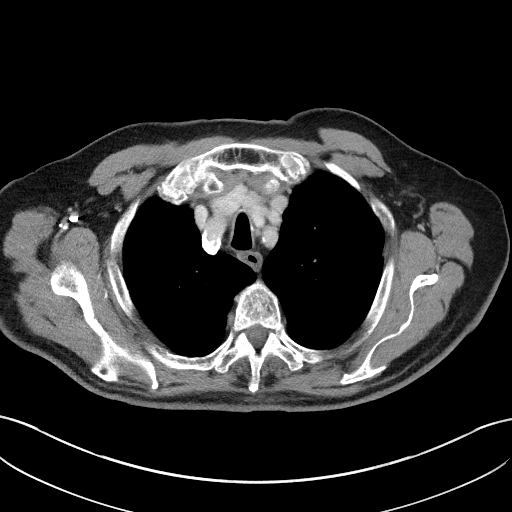

[Series 4: coronals · coronal · 0.85mm/px · 3 of 165 slices shown]
[im 33/165  mediastinal]
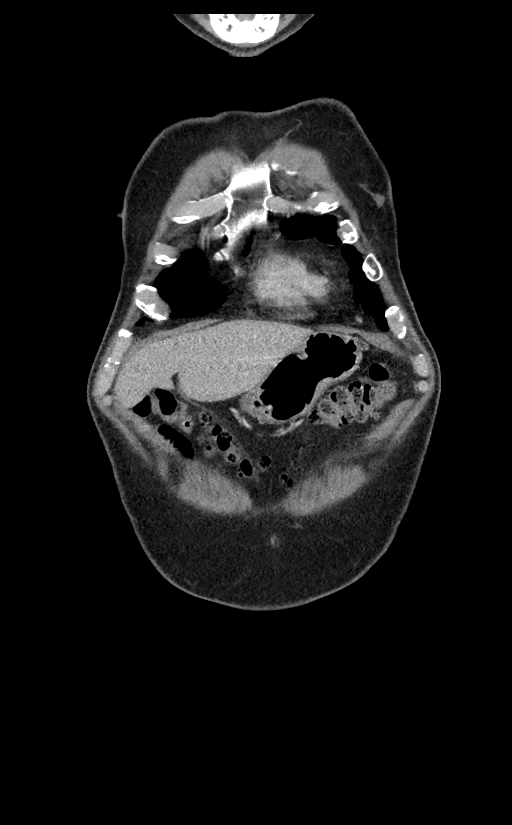
[im 66/165  mediastinal]
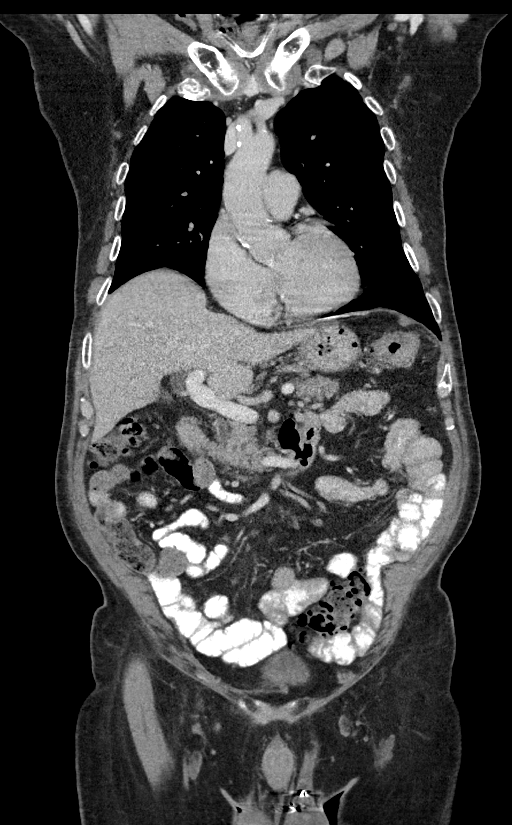
[im 99/165  mediastinal]
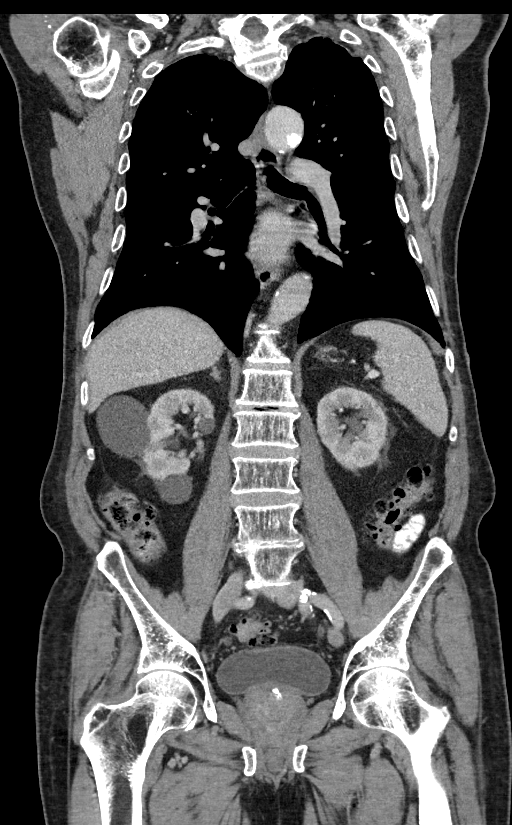

[13 of 36 positions shown; findings below may reference images not displayed]

RADIATION DOSE REDUCTION: This exam was performed according to the
departmental dose-optimization program which includes automated
exposure control, adjustment of the mA and/or kV according to
patient size and/or use of iterative reconstruction technique.

CONTRAST:  100mL OMNIPAQUE IOHEXOL 300 MG/ML  SOLN
FINDINGS: CT CHEST FINDINGS

Cardiovascular: There is no cardiomegaly or pericardial effusion.
Three-vessel coronary vascular calcification. Moderate
atherosclerotic calcification of the thoracic aorta. No aneurysmal
dilatation or dissection. The origins of the great vessels of the
aortic arch and the central pulmonary arteries appear patent as
visualized.

Mediastinum/Nodes: No hilar or mediastinal adenopathy. The esophagus
and the thyroid gland are grossly unremarkable. No mediastinal fluid
collection.

Lungs/Pleura: There are several scattered areas of interstitial
irregularity and nodularity primarily involving the right upper
lobe, likely related to chronic changes and interstitial crowding
(for example 46/3 and 37/3). There is a 2 mm right middle lobe
subpleural nodule (86/3). A 5 mm right lower lobe nodule (170/3), a
7 mm right lower lobe posterior subpleural nodule (116/3). No focal
consolidation, pleural effusion, or pneumothorax. The central
airways are patent.

Musculoskeletal: Osteopenia with degenerative changes of the spine.
No acute osseous pathology. No suspicious bone lesions.

CT ABDOMEN PELVIS FINDINGS

No intra-abdominal free air or free fluid.

Hepatobiliary: The liver is unremarkable. The gallbladder is
unremarkable.

Pancreas: Unremarkable. No pancreatic ductal dilatation or
surrounding inflammatory changes.

Spleen: Normal in size without focal abnormality.

Adrenals/Urinary Tract: The adrenal glands are unremarkable.
Multiple bilateral renal cysts and additional subcentimeter
hypodense lesions which are too small to characterize. There is no
hydronephrosis on either side. There is symmetric enhancement and
excretion of contrast by both kidneys. The visualized ureters and
urinary bladder appear unremarkable.

Stomach/Bowel: There is severe sigmoid diverticulosis and diffuse
colonic diverticula without active inflammatory changes. There is
focal area of thickening of the splenic flexure and proximal
descending colon which has improved since the prior CT likely
representing improvement of the previously seen diverticulitis.
Underlying mass is not excluded. Correlation with colon cancer
screening markers recommended. Colonoscopy may provide better
evaluation if clinically indicated. There is a 3 cm distal duodenal
diverticulum without active inflammation. There is no bowel
obstruction. The appendix is normal.

Vascular/Lymphatic: Advanced aortoiliac atherosclerotic disease. The
IVC is unremarkable. No portal venous gas. There is no adenopathy.

Reproductive: Enlarged prostate gland measuring 6 cm in transverse
axial diameter. The seminal vesicles are symmetric. Partially
visualized bilateral hydroceles. Bilateral vasectomy clips.

Other: None

Musculoskeletal: Osteopenia degenerative changes of the spine. No
acute osseous pathology. No suspicious bone lesions.
IMPRESSION: 1. No evidence of metastatic disease in the chest, abdomen, or
pelvis.
2. Focal area of thickening of the splenic flexure and proximal
descending colon which has improved since the prior CT likely
representing improvement of the previously seen diverticulitis.
Underlying mass is not excluded. Correlation with colon cancer
screening markers recommended. Colonoscopy may provide better
evaluation if clinically indicated.
3. Several tiny pulmonary nodules as well as scattered areas of
interstitial irregularity in the right upper lobe, likely post
inflammatory. No suspicious nodule. Attention on follow-up imaging
recommended.
4. Aortic Atherosclerosis ([id number]-C43.3).

## 2023-05-21 ENCOUNTER — Encounter (INDEPENDENT_AMBULATORY_CARE_PROVIDER_SITE_OTHER): Payer: Self-pay | Admitting: Gastroenterology

## 2023-05-21 ENCOUNTER — Ambulatory Visit (INDEPENDENT_AMBULATORY_CARE_PROVIDER_SITE_OTHER): Payer: Medicare Other | Admitting: Gastroenterology

## 2023-05-21 VITALS — BP 138/66 | HR 79 | Temp 97.7°F | Ht 70.0 in | Wt 214.1 lb

## 2023-05-21 DIAGNOSIS — K219 Gastro-esophageal reflux disease without esophagitis: Secondary | ICD-10-CM

## 2023-05-21 MED ORDER — OMEPRAZOLE 40 MG PO CPDR: 40.0000 mg | DELAYED_RELEASE_CAPSULE | Freq: Every day | ORAL | 3 refills | Status: DC

## 2023-05-21 NOTE — Patient Instructions (Signed)
Please continue with omeprazole 40mg  once daily Be mindful of greasy, spicy, fried, citrus foods, caffeine, carbonated drinks, chocolate and alcohol as these can increase reflux symptoms Stay upright 2-3 hours after eating, prior to lying down and avoid eating late in the evenings.  Follow up 1 year  It was a pleasure to see you today. I want to create trusting relationships with patients and provide genuine, compassionate, and quality care. I truly value your feedback! please be on the lookout for a survey regarding your visit with me today. I appreciate your input about our visit and your time in completing this!    Margarito Dehaas L. Jeanmarie Hubert, MSN, APRN, AGNP-C Adult-Gerontology Nurse Practitioner [facility] at [facility]

## 2023-05-21 NOTE — Progress Notes (Addendum)
Referring Provider: Pomposini, Rande Brunt, MD Primary Care Physician:  [name], MD Primary GI Physician: Dr. [name]   Chief Complaint  Patient presents with   Follow-up    Patient here today for  a three month follow up on her Genella Rife. Per patient he is not having any gi issues.    HPI:   Herbert Wood is a 88 y.o. 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 Some acid regurgitation. Belching.   Recommended to continue omeprazole 40mg  daily, rx carafate 1g QID, reflux precautions, keep scheduled colonoscopy.  Present: Symptoms improved with omeprazole and carafate. He is currently on omeprazole 40mg  daily. Denies breakthrough. Having 1 BM per day without rectal bleeding or melena. No abdominal pain. Doing well overall, he has no GI complaints at this time.    Last Colonoscopy:01/2023 - One 5 mm polyp in the ascending colon, removed                            with a cold snare. Resected and retrieved.                           - One 1 mm polyp in the ascending colon, removed                            with a cold biopsy forceps. Resected and retrieved.                           - Patent end-to-side colo-colonic anastomosis,                            characterized by healthy appearing mucosa. Biopsied.                           - One 3 mm polyp in the rectum, removed with a cold                            snare. Resected and retrieved.                           - Diverticulosis in the entire examined colon.                           - Non-bleeding internal hemorrhoids. A. ASCENDING COLON, POLYPECTOMY: Tubular adenoma Negative for high-grade dysplasia and carcinoma   B. COLON,  ANASTOMOSIS, POLYP, BIOPSY: Reactive colonic mucosa with activity and hyperplastic change compatible with anastomotic site Negative for dysplasia and carcinoma   C. RECTUM, POLYPECTOMY: Tubular adenoma, focally serrated Negative for high-grade dysplasia and carcinoma    Colonoscopy: 12/2021- Two 2 to 6 mm polyps in the transverse colon,                            removed with a cold snare. Resected  and retrieved.                           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 Biopsied. Tattooed(invasive moderately differentiated adenocarcinoma)                            - The distal rectum and anal verge are normal on                            retroflexion view.   Last Endoscopy:12/2021 - Esophageal mucosal changes classified as                            Barrett's stage C0-M1 per [place] criteria. Biopsied-BE                           - 5 cm hiatal hernia.                           - Erythematous mucosa in the antrum. Biopsied.                           - Duodenitis. Biopsied-chronic/peptic duodenitis   Recommendations:  Repeat EGD possibly in 3 years depending on overall health   Past Medical History:  Diagnosis Date   Adenomatous colon polyp    Anemia    Carotid bruit    Chronic dyspnea    Diabetes (HCC)    type 2   Diverticular disease    DVT (deep venous thrombosis) (HCC)    lower limb   Dyspnea    Factor V deficiency (HCC)    Hearing loss    bilateral   Hyperlipemia    Hypertension    Inguinal hernia    Kidney disease    stage 3   Lung nodule    Lymphadenopathy    Malignant neoplasm (HCC)    salivary gland   Murmur    Osteoarthritis    Senile purpura (HCC)     Past Surgical History:  Procedure Laterality Date   BIOPSY  01/10/2022   Procedure: BIOPSY;  Surgeon: Dolores Frame, MD;  Location: [facility];  Service: Gastroenterology;;   COLONOSCOPY WITH PROPOFOL N/A 01/10/2022    Procedure: COLONOSCOPY WITH PROPOFOL;  Surgeon: Dolores Frame, MD;  Location: [facility];  Service: Gastroenterology;  Laterality: N/A;  per Soledad Gerlach, pt to arrive at 9:15   COLONOSCOPY WITH PROPOFOL N/A 02/27/2023   Procedure: COLONOSCOPY WITH PROPOFOL;  Surgeon: Dolores Frame, MD;  Location: [facility];  Service: Gastroenterology;  Laterality: N/A;  9:15am;asa 3   ESOPHAGOGASTRODUODENOSCOPY (EGD) WITH PROPOFOL N/A 01/10/2022   Procedure: ESOPHAGOGASTRODUODENOSCOPY (EGD) WITH PROPOFOL;  Surgeon: Dolores Frame, MD;  [facility]: [facility];  Service: Gastroenterology;  Laterality: N/A;   HAND / FINGER LESION EXCISION     HERNIA REPAIR     IR RADIOLOGIST EVAL & MGMT  04/04/2023   MASTOIDECTOMY     PILONIDAL CYST EXCISION     POLYPECTOMY  01/10/2022  Procedure: POLYPECTOMY;  Surgeon: Dolores Frame, MD;  Location: [facility];  Service: Gastroenterology;;   POLYPECTOMY  02/27/2023   Procedure: POLYPECTOMY;  Surgeon: Dolores Frame, MD;  Location: [facility];  Service: Gastroenterology;;   RADIOLOGY WITH ANESTHESIA N/A 12/27/2022   Procedure: CT microwave ablation of the liver;  Surgeon: Irish Lack, MD;  Location: [facility];  Service: Radiology;  Laterality: N/A;   SUBMUCOSAL TATTOO INJECTION  01/10/2022   Procedure: SUBMUCOSAL TATTOO INJECTION;  Surgeon: Marguerita Merles, Reuel Boom, MD;  Location: [facility];  Service: Gastroenterology;;   SURGERY OF LIP     skin cancer    Current Outpatient Medications  Medication Sig Dispense Refill   amitriptyline (ELAVIL) 10 MG tablet Take 10 mg by mouth every evening.     amLODipine (NORVASC) 5 MG tablet Take 5 mg by mouth 2 (two) times daily.     ascorbic acid (VITAMIN C/NATURAL ROSE HIPS) 1000 MG tablet Take 1,000 mg by mouth every evening.     atorvastatin (LIPITOR) 40 MG tablet Take 40 mg by mouth every evening.     carvedilol (COREG) 6.25 MG tablet Take 6.25 mg by mouth 2  (two) times daily with a meal.     Cholecalciferol (VITAMIN D3) 125 MCG (5000 UT) CAPS Take 5,000 Units by mouth every evening.     dorzolamide (TRUSOPT) 2 % ophthalmic solution Place 1 drop into the left eye 2 (two) times daily.     ELIQUIS 5 MG TABS tablet TAKE 1 TABLET(5 MG) BY MOUTH TWICE DAILY 60 tablet 3   ferrous sulfate 325 (65 FE) MG tablet Take 325 mg by mouth daily with breakfast.     folic acid (FOLVITE) 800 MCG tablet Take 800 mcg by mouth every evening. With B12 Now (Brand)     furosemide (LASIX) 20 MG tablet Take 20 mg by mouth. As needed.     hydrALAZINE (APRESOLINE) 25 MG tablet Take 50 mg by mouth in the morning and at bedtime.     KERENDIA 10 MG TABS Take 10 mg by mouth daily.     latanoprost (XALATAN) 0.005 % ophthalmic solution Place 1 drop into both eyes at bedtime.     linagliptin (TRADJENTA) 5 MG TABS tablet Take 5 mg by mouth in the morning.     loratadine (CLARITIN) 10 MG tablet Take 10 mg by mouth every morning.     LORazepam (ATIVAN) 0.5 MG tablet Take 0.5 mg by mouth 2 (two) times daily as needed for anxiety.     Misc Natural Products (PROSTATE SUPPORT PO) Take 1 capsule by mouth every evening. NOW Prostate Support     omeprazole (PRILOSEC) 40 MG capsule Take 1 capsule (40 mg total) by mouth daily. 90 capsule 3   OVER THE COUNTER MEDICATION Now Methyl b-12 1000 mcg daily Now prostate support soft gel daily     vitamin B-12 (CYANOCOBALAMIN) 1000 MCG tablet Take 1,000 mcg by mouth every evening. Now (Brand)     albuterol (VENTOLIN HFA) 108 (90 Base) MCG/ACT inhaler Inhale into the lungs every 6 (six) hours as needed for wheezing or shortness of breath. (Patient not taking: Reported on 03/28/2023)     sucralfate (CARAFATE) 1 GM/10ML suspension Take 10 mLs (1 g total) by mouth 4 (four) times daily. (Patient not taking: Reported on 03/28/2023) 420 mL 1   No current facility-administered medications for this visit.    Allergies as of 05/21/2023 - Review Complete  05/21/2023  Allergen Reaction Noted  Gadobenate Nausea And Vomiting 05/20/2015    History reviewed. No pertinent family history.  Social History   Socioeconomic History   Marital status: Married    Spouse name: Not on file   Number of children: Not on file   Years of education: Not on file   Highest education level: Not on file  Occupational History   Not on file  Tobacco Use   Smoking status: Former    Current packs/day: 0.00    Average packs/day: 2.0 packs/day for 25.0 years (50.0 ttl pk-yrs)    Types: Cigarettes    Start date: 63    Quit date: 21    Years since quitting: 29.8    Passive exposure: Past   Smokeless tobacco: Never  Vaping Use   Vaping status: Never Used  Substance and Sexual Activity   Alcohol use: Never   Drug use: Never   Sexual activity: Not on file  Other Topics Concern   Not on file  Social History Narrative   Not on file   Social Determinants of Health   Financial Resource Strain: Not on file  Food Insecurity: No Food Insecurity (02/07/2023)   Hunger Vital Sign    Worried About Running Out of Food in the Last Year: Never true    Ran Out of Food in the Last Year: Never true  Transportation Needs: No Transportation Needs (02/07/2023)   PRAPARE - Administrator, Civil Service (Medical): No    Lack of Transportation (Non-Medical): No  Physical Activity: Not on file  Stress: Not on file  Social Connections: Not on file   Review of systems General: negative for malaise, night sweats, fever, chills, weight loss Neck: Negative for lumps, goiter, pain and significant neck swelling Resp: Negative for cough, wheezing, dyspnea at rest CV: Negative for chest pain, leg swelling, palpitations, orthopnea GI: denies melena, hematochezia, nausea, vomiting, diarrhea, constipation, dysphagia, odyonophagia, early satiety or unintentional weight loss.  The remainder of the review of systems is noncontributory.  Physical Exam: BP 138/66 (BP  Location: Left Arm, Patient Position: Sitting, Cuff Size: Large)   Pulse 79   Temp 97.7 F (36.5 C) (Oral)   Ht 5\' 10"  (1.778 m)   Wt 214 lb 1.6 oz (97.1 kg)   BMI 30.72 kg/m  General:   Alert and oriented. No distress noted. Pleasant and cooperative.  Head:  Normocephalic and atraumatic. Eyes:  Conjuctiva clear without scleral icterus. Mouth:  Oral mucosa pink and moist. Good dentition. No lesions. Heart: Normal rate and rhythm, s1 and s2 heart sounds present.  Lungs: Clear lung sounds in all lobes. Respirations equal and unlabored. Abdomen:  +BS, soft, non-tender and non-distended. No rebound or guarding. No HSM or masses noted. Neurologic:  Alert and  oriented x4 Psych:  Alert and cooperative. Normal mood and affect.  Invalid input(s): "6 MONTHS"   ASSESSMENT: Herbert Wood is a 88 y.o. male presenting today for follow up of GERD  Previously with some indigestion, abdominal discomfort, improved with course of carafate and omeprazole. He is continued on omeprazole 40mg  daily without breakthrough. Denies dysphagia, odynophagia. Will continue with current PPI regimen and good reflux precautions.     PLAN:  Continue with omeprazole 40mg  daily  2. Continue with good reflux precautions    All questions were answered, patient verbalized understanding and is in agreement with plan as outlined above.   Follow Up: 1 year   Zen Cedillos L. Jeanmarie Hubert, MSN, APRN, AGNP-C Adult-Gerontology Nurse Practitioner [facility]  I have reviewed the note and agree with the APP's assessment as described in this progress note  Katrinka Blazing, MD Gastroenterology and Hepatology [facility] Gastroenterology

## 2023-06-05 ENCOUNTER — Other Ambulatory Visit: Payer: Self-pay | Admitting: Hematology

## 2023-06-27 ENCOUNTER — Other Ambulatory Visit: Payer: Self-pay | Admitting: *Deleted

## 2023-06-27 ENCOUNTER — Inpatient Hospital Stay: Payer: Medicare Other | Attending: Hematology

## 2023-06-27 DIAGNOSIS — C187 Malignant neoplasm of sigmoid colon: Secondary | ICD-10-CM | POA: Insufficient documentation

## 2023-06-27 DIAGNOSIS — D509 Iron deficiency anemia, unspecified: Secondary | ICD-10-CM

## 2023-06-27 DIAGNOSIS — K769 Liver disease, unspecified: Secondary | ICD-10-CM

## 2023-06-27 LAB — CBC WITH DIFFERENTIAL/PLATELET
Abs Immature Granulocytes: 0.02 10*3/uL (ref 0.00–0.07)
Basophils Absolute: 0.1 10*3/uL (ref 0.0–0.1)
Basophils Relative: 1 %
Eosinophils Absolute: 0.2 10*3/uL (ref 0.0–0.5)
Eosinophils Relative: 3 %
HCT: 37.2 % — ABNORMAL LOW (ref 39.0–52.0)
Hemoglobin: 12.8 g/dL — ABNORMAL LOW (ref 13.0–17.0)
Immature Granulocytes: 0 %
Lymphocytes Relative: 14 %
Lymphs Abs: 1.1 10*3/uL (ref 0.7–4.0)
MCH: 33.8 pg (ref 26.0–34.0)
MCHC: 34.4 g/dL (ref 30.0–36.0)
MCV: 98.2 fL (ref 80.0–100.0)
Monocytes Absolute: 0.7 10*3/uL (ref 0.1–1.0)
Monocytes Relative: 9 %
Neutro Abs: 5.6 10*3/uL (ref 1.7–7.7)
Neutrophils Relative %: 73 %
Platelets: 176 10*3/uL (ref 150–400)
RBC: 3.79 MIL/uL — ABNORMAL LOW (ref 4.22–5.81)
RDW: 13 % (ref 11.5–15.5)
WBC: 7.7 10*3/uL (ref 4.0–10.5)
nRBC: 0 % (ref 0.0–0.2)

## 2023-06-27 LAB — COMPREHENSIVE METABOLIC PANEL
ALT: 42 U/L (ref 0–44)
AST: 27 U/L (ref 15–41)
Albumin: 3.8 g/dL (ref 3.5–5.0)
Alkaline Phosphatase: 104 U/L (ref 38–126)
Anion gap: 8 (ref 5–15)
BUN: 37 mg/dL — ABNORMAL HIGH (ref 8–23)
CO2: 22 mmol/L (ref 22–32)
Calcium: 9.1 mg/dL (ref 8.9–10.3)
Chloride: 110 mmol/L (ref 98–111)
Creatinine, Ser: 1.37 mg/dL — ABNORMAL HIGH (ref 0.61–1.24)
GFR, Estimated: 50 mL/min — ABNORMAL LOW (ref >60)
Glucose, Bld: 144 mg/dL — ABNORMAL HIGH (ref 70–99)
Potassium: 4.1 mmol/L (ref 3.5–5.1)
Sodium: 140 mmol/L (ref 135–145)
Total Bilirubin: 2 mg/dL — ABNORMAL HIGH (ref <1.2)
Total Protein: 6.4 g/dL — ABNORMAL LOW (ref 6.5–8.1)

## 2023-06-27 LAB — FERRITIN: Ferritin: 87 ng/mL (ref 24–336)

## 2023-06-27 LAB — IRON AND TIBC
Iron: 141 ug/dL (ref 45–182)
Saturation Ratios: 43 % — ABNORMAL HIGH (ref 17.9–39.5)
TIBC: 327 ug/dL (ref 250–450)
UIBC: 186 ug/dL

## 2023-06-27 MED ORDER — PREDNISONE 50 MG PO TABS: ORAL_TABLET | ORAL | 0 refills | Status: DC

## 2023-06-28 LAB — CEA: CEA: 46.1 ng/mL — ABNORMAL HIGH (ref 0.0–4.7)

## 2023-07-02 ENCOUNTER — Ambulatory Visit (HOSPITAL_COMMUNITY)
Admission: RE | Admit: 2023-07-02 | Discharge: 2023-07-02 | Disposition: A | Discharge status: Home / Self Care | Payer: Medicare Other | Source: Ambulatory Visit | Attending: Hematology | Admitting: Hematology

## 2023-07-02 DIAGNOSIS — C187 Malignant neoplasm of sigmoid colon: Secondary | ICD-10-CM

## 2023-07-02 DIAGNOSIS — K769 Liver disease, unspecified: Secondary | ICD-10-CM

## 2023-07-02 MED ORDER — IOHEXOL 300 MG/ML  SOLN
75.0000 mL | Freq: Once | INTRAMUSCULAR | Status: AC | PRN
Start: 2023-07-02 — End: 2023-07-02
  Administered 2023-07-02: 75 mL via INTRAVENOUS

## 2023-07-02 MED ORDER — GADOBUTROL 1 MMOL/ML IV SOLN
7.0000 mL | Freq: Once | INTRAVENOUS | Status: AC | PRN
  Administered 2023-07-02: 7 mL via INTRAVENOUS

## 2023-07-09 ENCOUNTER — Inpatient Hospital Stay: Payer: Medicare Other | Admitting: Hematology

## 2023-07-12 ENCOUNTER — Inpatient Hospital Stay: Payer: Medicare Other | Attending: Hematology | Admitting: Hematology

## 2023-07-12 VITALS — BP 142/61 | HR 67 | Temp 97.6°F | Resp 20 | Wt 209.8 lb

## 2023-07-12 DIAGNOSIS — Z7901 Long term (current) use of anticoagulants: Secondary | ICD-10-CM | POA: Insufficient documentation

## 2023-07-12 DIAGNOSIS — D631 Anemia in chronic kidney disease: Secondary | ICD-10-CM | POA: Diagnosis not present

## 2023-07-12 DIAGNOSIS — C787 Secondary malignant neoplasm of liver and intrahepatic bile duct: Secondary | ICD-10-CM | POA: Insufficient documentation

## 2023-07-12 DIAGNOSIS — Z87891 Personal history of nicotine dependence: Secondary | ICD-10-CM | POA: Diagnosis not present

## 2023-07-12 DIAGNOSIS — N189 Chronic kidney disease, unspecified: Secondary | ICD-10-CM | POA: Diagnosis not present

## 2023-07-12 DIAGNOSIS — I129 Hypertensive chronic kidney disease with stage 1 through stage 4 chronic kidney disease, or unspecified chronic kidney disease: Secondary | ICD-10-CM | POA: Insufficient documentation

## 2023-07-12 DIAGNOSIS — D509 Iron deficiency anemia, unspecified: Secondary | ICD-10-CM | POA: Diagnosis not present

## 2023-07-12 DIAGNOSIS — Z79899 Other long term (current) drug therapy: Secondary | ICD-10-CM | POA: Diagnosis not present

## 2023-07-12 DIAGNOSIS — E1122 Type 2 diabetes mellitus with diabetic chronic kidney disease: Secondary | ICD-10-CM | POA: Insufficient documentation

## 2023-07-12 DIAGNOSIS — C187 Malignant neoplasm of sigmoid colon: Secondary | ICD-10-CM | POA: Insufficient documentation

## 2023-07-12 DIAGNOSIS — Z86718 Personal history of other venous thrombosis and embolism: Secondary | ICD-10-CM | POA: Insufficient documentation

## 2023-07-12 MED ORDER — TRAMADOL HCL 50 MG PO TABS: 50.0000 mg | ORAL_TABLET | Freq: Four times a day (QID) | ORAL | 0 refills | Status: DC | PRN

## 2023-07-12 NOTE — Patient Instructions (Addendum)
[facility] at [facility] Discharge Instructions   You were seen and examined today by Dr. Ellin Saba.  He reviewed the results of your lab work which are mostly normal/stable. Your CEA has increased significantly. We will plan to get a PET scan. We are awaiting the results of the CT scan of the chest.   We will see you back after the PET scan to review the results.   Return as scheduled.    Thank you for choosing [facility] at [facility] to provide your oncology and hematology care.  To afford each patient quality time with our provider, please arrive at least 15 minutes before your scheduled appointment time.   If you have a lab appointment with the Cancer Center please come in thru the Main Entrance and check in at the main information desk.  You need to re-schedule your appointment should you arrive 10 or more minutes late.  We strive to give you quality time with our providers, and arriving late affects you and other patients whose appointments are after yours.  Also, if you no show three or more times for appointments you may be dismissed from the clinic at the providers discretion.     Again, thank you for choosing [facility].  Our hope is that these requests will decrease the amount of time that you wait before being seen by our physicians.       _____________________________________________________________  Should you have questions after your visit to [facility], please contact our office at [phone] and follow the prompts.  Our office hours are 8:00 a.m. and 4:30 p.m. Monday - Friday.  Please note that voicemails left after 4:00 p.m. may not be returned until the following business day.  We are closed weekends and major holidays.  You do have access to a nurse 24-7, just call the main number to the clinic [phone] and do not press any options, hold on the line and a nurse will answer the phone.     For prescription refill requests, have your pharmacy contact our office and allow 72 hours.    Due to Covid, you will need to wear a mask upon entering the hospital. If you do not have a mask, a mask will be given to you at the Main Entrance upon arrival. For doctor visits, patients may have 1 support person age 41 or older with them. For treatment visits, patients can not have anyone with them due to social distancing guidelines and our immunocompromised population.

## 2023-07-12 NOTE — Progress Notes (Addendum)
[facility] [address] [place] [address]    Clinic Day:  07/12/2023  Referring physician: [name], MD  Patient Care Team: [name], MD as PCP - General (Internal Medicine) [name], RN as Oncology Nurse Navigator (Medical Oncology) [name], MD as Medical Oncologist (Medical Oncology)   ASSESSMENT & PLAN:   Assessment: 1. Stage II (T3N0) sigmoid colon cancer, MMR preserved: - Anemia with hemoglobin of 8, recent admission with diverticulitis.  He also had weight loss of 40 pounds in the last 6 months. - Colonoscopy by Dr. [name] (01/10/2022): Fungating and ulcerated partially obstructing mass found at 42 cm proximal to the anus.  Mass is circumferential and measured 3 cm in length. - Pathology: Invasive moderately differentiated adenocarcinoma of the descending colon mass.  Transverse colon polypectomy shows fragments of at least intramucosal adenocarcinoma and high-grade dysplasia. - CT CAP (01/13/2022): No evidence of metastatic disease in the chest, abdomen or pelvis.  Several tiny lung nodules and scattered areas of interstitial irregularity in the right upper lobe likely postinflammatory. - Preoperative CEA (01/10/2022): 7.9. - Left hemicolectomy (02/17/2022): Moderately differentiated grade 2 adenocarcinoma, no perforation, no lymphovascular/perineural invasion, margins negative.  0/21 lymph nodes involved.  PT3 pN0. - PET scan on 11/09/2022: 3.2 x 2.3 cm right hepatic lobe metastasis. - MRI of the liver on 11/22/2022: Isolated inferior right hepatic lobe 2.8 cm. - Right lobe lesion thermal ablation on 12/27/2022.   2. Social/family history: - Lives at home with his wife.  Wife works at [employer] nephrology office in [place].  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  Quit smoking in 1995.  Smoked 1 to 2 packs/day for 25 years. - Son had DVT.  Son is also a carrier for factor V Leiden.  Mother had  bladder cancer and uterine cancer.   3.  Left leg DVT: - Patient had a left leg DVT approximately 2 years ago.  His wife thinks that DVT has happened about 4 to 6 weeks after his second COVID infection.  He was very minimally symptomatic from the COVID infection.  At the time of the DVT, he was completely mobile and did not have any surgery. - Patient was heterozygous for factor V Leiden as checked by Dr. [name] on 09/20/2011. - I have recommended that he get the records of timing of DVT and the COVID test.  If it is within 4 to 6 weeks of his COVID diagnosis, we may safely discontinue anticoagulation considering it can be provoked by COVID infection.  If it is unprovoked DVT, may continue low intensity anticoagulation.   4.  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 - Adjuvant XRT 50 Gray, completed 08/11/2015.    Plan: 1.  Stage II (T3N0) sigmoid colon cancer: - Reviewed labs from 06/27/2023: CEA increased to 46 from 11.6. - LFTs are grossly unchanged with mildly elevated total bilirubin. - Reviewed MRI of the abdomen from 07/02/2023: Limited by breathing motion artifact but without any major significant changes and no new lesions.  Treated lesion is grossly similar in appearance and most likely there is a residual heterogeneous contrast-enhancement of the posterior margin of the lesion. - CT chest on 07/02/2023: New 6 x 5 mm right lower lobe nodule.  Numerous other subcentimeter and subpleural nodules are grossly unchanged.  CT chest results were not available at the time of follow-up.  I  have called and informed the patient. - Recommend PET CT scan to evaluate for metastatic disease.  RTC after the PET scan.   2.  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  Continue iron tablet daily.  Ferritin is 87 and hemoglobin 12.8.   3.  Left leg DVT: - Continue Eliquis.  No bleeding  issues.  4.  Low back pain: - He had on and off low back pain for 10+ years.  He reports exacerbation of low back pain for the last few days, started a day after his MRI and CT scan.  He cannot take NSAIDs due to CKD. - Will give him tramadol 50 mg to be taken as needed with stool softener.    Orders Placed This Encounter  Procedures   NM PET Image Restag (PS) Skull Base To Thigh    Standing Status:   Future    Expected Date:   07/26/2023    Expiration Date:   07/11/2024    If indicated for the ordered procedure, I authorize the administration of a radiopharmaceutical per Radiology protocol:   Yes    Preferred imaging location?:   [facility]      I,Helena R Teague,acting as a scribe for [name], MD.,have documented all relevant documentation on the behalf of [name], MD,as directed by  [name], MD while in the presence of [name], MD.  I, [name] MD, have reviewed the above documentation for accuracy and completeness, and I agree with the above.    [name], MD   12/12/20244:51 PM  CHIEF COMPLAINT:   Diagnosis: sigmoid colon cancer and iron deficiency anemia    Cancer Staging  Cancer of sigmoid colon [facility]) Staging form: Colon and Rectum, AJCC 8th Edition - Clinical stage from 01/25/2022: Stage IIA (cT3, cN0, cM0) - Signed by [name], MD on 03/16/2022    Prior Therapy: 1. Left hemicolectomy on 02/17/2022  2. Microwave ablation of liver metastasis, 12/27/22  Current Therapy:  Surveillance    HISTORY OF PRESENT ILLNESS:   Oncology History  Cancer of sigmoid colon (HCC)  01/25/2022 Initial Diagnosis   Cancer of sigmoid colon (HCC)   01/25/2022 Cancer Staging   Staging form: Colon and Rectum, AJCC 8th Edition - Clinical stage from 01/25/2022: Stage IIA (cT3, cN0, cM0) - Signed by [name], MD on 03/16/2022 Histopathologic type: Adenocarcinoma, NOS Stage prefix: Initial  diagnosis Total positive nodes: 0 Total nodes examined: 21 Histologic grade (G): G2 Histologic grading system: 4 grade system Microsatellite instability (MSI): Stable      INTERVAL HISTORY:   Oswell is a 88 y.o. male presenting to clinic today for follow up of sigmoid colon cancer and iron deficiency anemia. He was last seen by me on 03/28/23.  Since his last visit, he underwent restaging chest CT and abdomen MRI on 07/02/23. MRI of the abdomen found: very limited exam by breath motion artifact, and this significantly compromised evaluation of ablated lesion in the inferior right lobe, hepatic segment VI; lesion is overall grossly similar in appearance, and most likely there is residual heterogeneous contrast enhancement of the posterior margin of the lesion as previously assessed; no obvious new liver lesions; signal characteristics suggestive of hepatic parenchymal iron deposition; and pancolonic diverticulosis.   Today, he states that he is doing well overall. His appetite level is at 80%. His energy level is at 80%. He is accompanied by his wife. He reports lower back pain radiating to the hip that is  a 7/10 severity since 07/03/23 likely due to bending over for long periods of time. He has a history of back spasms and back pain 5-10 years ago. He has been taking OTC Tylenol for he pain. He was told by his PCP he has a bulging disc many years ago and had his first physical therapy appointment today. He is taking iron as prescribed.  PAST MEDICAL HISTORY:   Past Medical History: Past Medical History:  Diagnosis Date   Adenomatous colon polyp    Anemia    Carotid bruit    Chronic dyspnea    Diabetes (HCC)    type 2   Diverticular disease    DVT (deep venous thrombosis) (HCC)    lower limb   Dyspnea    Factor V deficiency (HCC)    Hearing loss    bilateral   Hyperlipemia    Hypertension    Inguinal hernia    Kidney disease    stage 3   Lung nodule    Lymphadenopathy     Malignant neoplasm (HCC)    salivary gland   Murmur    Osteoarthritis    Senile purpura (HCC)     Surgical History: Past Surgical History:  Procedure Laterality Date   BIOPSY  01/10/2022   Procedure: BIOPSY;  Surgeon: Dolores Frame, MD;  [facility]: [facility];  Service: Gastroenterology;;   COLONOSCOPY WITH PROPOFOL N/A 01/10/2022   Procedure: COLONOSCOPY WITH PROPOFOL;  Surgeon: Dolores Frame, MD;  [facility]: [facility];  Service: Gastroenterology;  Laterality: N/A;  per Soledad Gerlach, pt to arrive at 9:15   COLONOSCOPY WITH PROPOFOL N/A 02/27/2023   Procedure: COLONOSCOPY WITH PROPOFOL;  Surgeon: Dolores Frame, MD;  [facility]: [facility];  Service: Gastroenterology;  Laterality: N/A;  9:15am;asa 3   ESOPHAGOGASTRODUODENOSCOPY (EGD) WITH PROPOFOL N/A 01/10/2022   Procedure: ESOPHAGOGASTRODUODENOSCOPY (EGD) WITH PROPOFOL;  Surgeon: Dolores Frame, MD;  [facility];  Service: Gastroenterology;  Laterality: N/A;   HAND / FINGER LESION EXCISION     HERNIA REPAIR     IR RADIOLOGIST EVAL & MGMT  04/04/2023   MASTOIDECTOMY     PILONIDAL CYST EXCISION     POLYPECTOMY  01/10/2022   Procedure: POLYPECTOMY;  Surgeon: Dolores Frame, MD;  [facility]: [facility];  Service: Gastroenterology;;   POLYPECTOMY  02/27/2023   Procedure: POLYPECTOMY;  Surgeon: Dolores Frame, MD;  [facility];  Service: Gastroenterology;;   RADIOLOGY WITH ANESTHESIA N/A 12/27/2022   Procedure: CT microwave ablation of the liver;  Surgeon: Irish Lack, MD;  Location: [facility];  Service: Radiology;  Laterality: N/A;   SUBMUCOSAL TATTOO INJECTION  01/10/2022   Procedure: SUBMUCOSAL TATTOO INJECTION;  Surgeon: Dolores Frame, MD;  Location: [facility];  Service: Gastroenterology;;   SURGERY OF LIP     skin cancer    Social History: Social History   Socioeconomic History   Marital status: Married    Spouse name:  Not on file   Number of children: Not on file   Years of education: Not on file   Highest education level: Not on file  Occupational History   Not on file  Tobacco Use   Smoking status: Former    Current packs/day: 0.00    Average packs/day: 2.0 packs/day for 25.0 years (50.0 ttl pk-yrs)    Types: Cigarettes    Start date: 26    Quit date: 46    Years since quitting: 66.9  Passive exposure: Past   Smokeless tobacco: Never  Vaping Use   Vaping status: Never Used  Substance and Sexual Activity   Alcohol use: Never   Drug use: Never   Sexual activity: Not on file  Other Topics Concern   Not on file  Social History Narrative   Not on file   Social Drivers of Health   Financial Resource Strain: Not on file  Food Insecurity: No Food Insecurity (02/07/2023)   Hunger Vital Sign    Worried About Running Out of Food in the Last Year: Never true    Ran Out of Food in the Last Year: Never true  Transportation Needs: No Transportation Needs (02/07/2023)   PRAPARE - Administrator, Civil Service (Medical): No    Lack of Transportation (Non-Medical): No  Physical Activity: Not on file  Stress: Not on file  Social Connections: Not on file  Intimate Partner Violence: Not At Risk (02/07/2023)   Humiliation, Afraid, Rape, and Kick questionnaire    Fear of Current or Ex-Partner: No    Emotionally Abused: No    Physically Abused: No    Sexually Abused: No    Family History: No family history on file.  Current Medications:  Current Outpatient Medications:    albuterol (VENTOLIN HFA) 108 (90 Base) MCG/ACT inhaler, Inhale into the lungs every 6 (six) hours as needed for wheezing or shortness of breath., Disp: , Rfl:    amitriptyline (ELAVIL) 10 MG tablet, Take 10 mg by mouth every evening., Disp: , Rfl:    amLODipine (NORVASC) 5 MG tablet, Take 5 mg by mouth 2 (two) times daily., Disp: , Rfl:    ascorbic acid (VITAMIN C/NATURAL ROSE HIPS) 1000 MG tablet, Take 1,000 mg  by mouth every evening., Disp: , Rfl:    atorvastatin (LIPITOR) 40 MG tablet, Take 40 mg by mouth every evening., Disp: , Rfl:    carvedilol (COREG) 6.25 MG tablet, Take 6.25 mg by mouth 2 (two) times daily with a meal., Disp: , Rfl:    Cholecalciferol (VITAMIN D3) 125 MCG (5000 UT) CAPS, Take 5,000 Units by mouth every evening., Disp: , Rfl:    dorzolamide (TRUSOPT) 2 % ophthalmic solution, Place 1 drop into the left eye 2 (two) times daily., Disp: , Rfl:    ELIQUIS 5 MG TABS tablet, TAKE 1 TABLET(5 MG) BY MOUTH TWICE DAILY, Disp: 60 tablet, Rfl: 3   ferrous sulfate 325 (65 FE) MG tablet, Take 325 mg by mouth daily with breakfast., Disp: , Rfl:    folic acid (FOLVITE) 800 MCG tablet, Take 800 mcg by mouth every evening. With B12 Now (Brand), Disp: , Rfl:    furosemide (LASIX) 20 MG tablet, Take 20 mg by mouth. As needed., Disp: , Rfl:    hydrALAZINE (APRESOLINE) 25 MG tablet, Take 50 mg by mouth in the morning and at bedtime., Disp: , Rfl:    KERENDIA 10 MG TABS, Take 10 mg by mouth daily., Disp: , Rfl:    latanoprost (XALATAN) 0.005 % ophthalmic solution, Place 1 drop into both eyes at bedtime., Disp: , Rfl:    linagliptin (TRADJENTA) 5 MG TABS tablet, Take 5 mg by mouth in the morning., Disp: , Rfl:    loratadine (CLARITIN) 10 MG tablet, Take 10 mg by mouth every morning., Disp: , Rfl:    LORazepam (ATIVAN) 0.5 MG tablet, Take 0.5 mg by mouth 2 (two) times daily as needed for anxiety., Disp: , Rfl:    Misc Natural Products (  PROSTATE SUPPORT PO), Take 1 capsule by mouth every evening. NOW Prostate Support, Disp: , Rfl:    omeprazole (PRILOSEC) 40 MG capsule, Take 1 capsule (40 mg total) by mouth daily., Disp: 90 capsule, Rfl: 3   OVER THE COUNTER MEDICATION, Now Methyl b-12 1000 mcg daily Now prostate support soft gel daily, Disp: , Rfl:    predniSONE (DELTASONE) 50 MG tablet, Take 1 tablet 13 hours prior to scan, 7 hours prior to scan and 1 hour prior to scan for contrast allergy Take 50 mg  Benadryl 1 hour prior to scan, Disp: 3 tablet, Rfl: 0   sucralfate (CARAFATE) 1 GM/10ML suspension, Take 10 mLs (1 g total) by mouth 4 (four) times daily., Disp: 420 mL, Rfl: 1   traMADol (ULTRAM) 50 MG tablet, Take 1 tablet (50 mg total) by mouth every 6 (six) hours as needed., Disp: 30 tablet, Rfl: 0   vitamin B-12 (CYANOCOBALAMIN) 1000 MCG tablet, Take 1,000 mcg by mouth every evening. Now (Brand), Disp: , Rfl:    Allergies: Allergies  Allergen Reactions   Gadobenate Nausea And Vomiting    Immediately upon the infusion of 15mL multihance contrast  Patient had exteme nausea and vomiting.   No other symptoms noted .  No injury.  MRI scan was completed after patient felt better.   Immediately upon the infusion of 15mL multihance contrast  Patient had exteme nausea and vomiting.   No other symptoms noted .  No injury.  MRI scan was completed after patient felt better.       REVIEW OF SYSTEMS:   Review of Systems  Constitutional:  Negative for chills, fatigue and fever.  HENT:   Negative for lump/mass, mouth sores, nosebleeds, sore throat and trouble swallowing.   Eyes:  Negative for eye problems.  Respiratory:  Positive for cough and shortness of breath.   Cardiovascular:  Negative for chest pain, leg swelling and palpitations.  Gastrointestinal:  Negative for abdominal pain, constipation, diarrhea, nausea and vomiting.  Genitourinary:  Negative for bladder incontinence, difficulty urinating, dysuria, frequency, hematuria and nocturia.   Musculoskeletal:  Positive for back pain. Negative for arthralgias, flank pain (7/10 severity), myalgias and neck pain.  Skin:  Negative for itching and rash.  Neurological:  Negative for dizziness, headaches and numbness.  Hematological:  Does not bruise/bleed easily.  Psychiatric/Behavioral:  Negative for depression, sleep disturbance and suicidal ideas. The patient is not nervous/anxious.   All other systems reviewed and are negative.    VITALS:    Blood pressure (!) 142/61, pulse 67, temperature 97.6 F (36.4 C), temperature source Tympanic, resp. rate 20, weight 209 lb 12.8 oz (95.2 kg), SpO2 98%.  Wt Readings from Last 3 Encounters:  07/12/23 209 lb 12.8 oz (95.2 kg)  05/21/23 214 lb 1.6 oz (97.1 kg)  03/28/23 212 lb 3.2 oz (96.3 kg)    Body mass index is 30.1 kg/m.  Performance status (ECOG): 1 - Symptomatic but completely ambulatory  PHYSICAL EXAM:   Physical Exam Vitals and nursing note reviewed. Exam conducted with a chaperone present.  Constitutional:      Appearance: Normal appearance.  Cardiovascular:     Rate and Rhythm: Normal rate and regular rhythm.     Pulses: Normal pulses.     Heart sounds: Normal heart sounds.  Pulmonary:     Effort: Pulmonary effort is normal.     Breath sounds: Normal breath sounds.  Abdominal:     Palpations: Abdomen is soft. There is no hepatomegaly, splenomegaly or  mass.     Tenderness: There is no abdominal tenderness.  Musculoskeletal:     Right lower leg: No edema.     Left lower leg: No edema.  Lymphadenopathy:     Cervical: No cervical adenopathy.     Right cervical: No superficial, deep or posterior cervical adenopathy.    Left cervical: No superficial, deep or posterior cervical adenopathy.     Upper Body:     Right upper body: No supraclavicular or axillary adenopathy.     Left upper body: No supraclavicular or axillary adenopathy.  Neurological:     General: No focal deficit present.     Mental Status: He is alert and oriented to person, place, and time.  Psychiatric:        Mood and Affect: Mood normal.        Behavior: Behavior normal.     LABS:      Latest Ref Rng & Units 06/27/2023    9:18 AM 03/22/2023    8:09 AM 02/08/2023    4:27 AM  CBC  WBC 4.0 - 10.5 K/uL 7.7  6.8  9.7   Hemoglobin 13.0 - 17.0 g/dL 19.1  47.8  29.5   Hematocrit 39.0 - 52.0 % 37.2  37.7  30.3   Platelets 150 - 400 K/uL 176  203  135       Latest Ref Rng & Units 06/27/2023     9:18 AM 03/22/2023    8:09 AM 02/08/2023    4:27 AM  CMP  Glucose 70 - 99 mg/dL 621  308  87   BUN 8 - 23 mg/dL 37  35  51   Creatinine 0.61 - 1.24 mg/dL 6.57  8.46  9.62   Sodium 135 - 145 mmol/L 140  137  133   Potassium 3.5 - 5.1 mmol/L 4.1  4.2  3.2   Chloride 98 - 111 mmol/L 110  106  104   CO2 22 - 32 mmol/L 22  21  21    Calcium 8.9 - 10.3 mg/dL 9.1  9.0  8.2   Total Protein 6.5 - 8.1 g/dL 6.4  7.2    Total Bilirubin <1.2 mg/dL 2.0  1.2    Alkaline Phos 38 - 126 U/L 104  113    AST 15 - 41 U/L 27  29    ALT 0 - 44 U/L 42  43       Lab Results  Component Value Date   CEA1 46.1 (H) 06/27/2023   /  CEA  Date Value Ref Range Status  06/27/2023 46.1 (H) 0.0 - 4.7 ng/mL Final    Comment:    (NOTE)                             Nonsmokers          <3.9                             Smokers             <5.6 [employer] Electrochemiluminescence Immunoassay (ECLIA) Values obtained with different assay methods or kits cannot be used interchangeably.  Results cannot be interpreted as absolute evidence of the presence or absence of malignant disease. Performed At: [facility] [address] [place], [place] [id number] Jolene Schimke MD MW:[id number]    No results found for: "PSA1" No results found for: "QIH474" No  results found for: "CAN125"  Lab Results  Component Value Date   TOTALPROTELP 6.3 01/25/2022   ALBUMINELP 3.6 01/25/2022   A1GS 0.2 01/25/2022   A2GS 0.7 01/25/2022   BETS 0.9 01/25/2022   GAMS 0.9 01/25/2022   MSPIKE Not Observed 01/25/2022   SPEI Comment 01/25/2022   Lab Results  Component Value Date   TIBC 327 06/27/2023   TIBC 325 03/22/2023   TIBC 331 10/19/2022   FERRITIN 87 06/27/2023   FERRITIN 96 03/22/2023   FERRITIN 53 10/19/2022   IRONPCTSAT 43 (H) 06/27/2023   IRONPCTSAT 32 03/22/2023   IRONPCTSAT 52 (H) 10/19/2022   No results found for: "LDH"   STUDIES:   CT Chest W Contrast Result Date: 07/12/2023 CLINICAL DATA:   History of colon cancer and liver metastases EXAM: CT CHEST WITH CONTRAST TECHNIQUE: Multidetector CT imaging of the chest was performed during intravenous contrast administration. RADIATION DOSE REDUCTION: This exam was performed according to the departmental dose-optimization program which includes automated exposure control, adjustment of the mA and/or kV according to patient size and/or use of iterative reconstruction technique. CONTRAST:  75mL OMNIPAQUE IOHEXOL 300 MG/ML  SOLN COMPARISON:  03/22/2023 FINDINGS: Cardiovascular: The heart is unremarkable without pericardial effusion. Stable calcifications of the mitral and aortic valve. No evidence of thoracic aortic aneurysm or dissection. There is dense atherosclerosis of the aortic arch, with high-grade stenosis of the proximal descending thoracic aorta and the origin of the left subclavian artery, unchanged since prior exam. Stenosis estimated greater than 70%. Mediastinum/Nodes: No enlarged mediastinal, hilar, or axillary lymph nodes. Thyroid gland, trachea, and esophagus demonstrate no significant findings. Lungs/Pleura: No acute airspace disease, effusion, or pneumothorax. Mild emphysema. Central airways are patent. Scattered small pulmonary nodules are are again identified, with index nodules as follows: Right upper lobe, solid nodule, image 36/3, 6 x 4 mm. Previously 8 x 4 mm. Right upper lobe, sub solid nodule, image 46/3, 6 x 7 mm. Unchanged. Right lower lobe, solid nodule, image 108/3, 4 x 5 mm.  Stable. Right lower lobe, solid nodule, image 117/3, 8 x 4 mm. Previously 7 x 3 mm. There is a new solid 5 x 6 mm nodule within the right lower lobe image 110/3. No other new pulmonary nodules or masses. Upper Abdomen: Hypodense mass within the inferior right lobe liver image 174/2 measures 5.5 x 3.0 cm, corresponding to the known history of treated liver metastasis. No other acute upper abdominal findings. Musculoskeletal: No acute or destructive bony  abnormalities. Reconstructed images demonstrate no additional findings. IMPRESSION: 1. New 6 x 5 mm right lower lobe nodule which will require close attention on follow-up. The numerous other subcentimeter and subpleural nodules seen previously are grossly unchanged. 2. Hypodensity inferior right lobe liver compatible with history of treated metastatic disease. This has been recently evaluated by MRI. 3. Aortic Atherosclerosis (ICD10-I70.0). High-grade stenosis of the distal aortic arch and origin of the left subclavian artery, estimated greater than 70%, stable. 4.  Emphysema (ICD10-J43.9). Electronically Signed   By: Sharlet Salina M.D.   On: 07/12/2023 16:10   MR Abdomen W Wo Contrast Result Date: 07/02/2023 CLINICAL DATA:  Colon cancer with liver metastasis. Status post percutaneous ablation of the right lobe EXAM: MRI ABDOMEN WITHOUT AND WITH CONTRAST TECHNIQUE: Multiplanar multisequence MR imaging of the abdomen was performed both before and after the administration of intravenous contrast. CONTRAST:  7mL GADAVIST GADOBUTROL 1 MMOL/ML IV SOLN COMPARISON:  03/22/2023 FINDINGS: Examination is significantly limited by breath motion artifact, particularly multiphasic contrast  enhanced sequences. Lower chest: No acute abnormality. Hepatobiliary: Evaluation of the liver on today's examination unfortunately very limited by breath motion artifact, and this significantly compromises evaluation of ablated lesion in the inferior right lobe, hepatic segment VI (series 14, image 45). Lesion is overall grossly similar in appearance, and most likely there is residual heterogeneous contrast enhancement of the posterior margin of the lesion as previously assessed. No obvious new liver lesions. Mild hepatic signal inversion on in and opposed phase imaging as well as intrinsic T2 hypointensity of the liver parenchyma. Possible tiny gallstones or mobile sludge (series 4, image 21). No gallbladder wall thickening, or biliary  dilatation. Pancreas: Unremarkable. No pancreatic ductal dilatation or surrounding inflammatory changes. Spleen: Normal in size without significant abnormality. Adrenals/Urinary Tract: Adrenal glands are unremarkable. Numerous bilateral simple and thinly septated fluid signal renal cortical and parapelvic cysts, benign, requiring no specific further follow-up or characterization. Stomach/Bowel: Stomach is within normal limits. No evidence of bowel wall thickening, distention, or inflammatory changes. Pancolonic diverticulosis Vascular/Lymphatic: No significant vascular findings are present. No enlarged abdominal lymph nodes. Other: Small, broad-based midline ventral hernia (series 4, image 33). No ascites. Musculoskeletal: No acute or significant osseous findings. IMPRESSION: 1. Evaluation of the liver on today's examination unfortunately very limited by breath motion artifact, and this significantly compromises evaluation of ablated lesion in the inferior right lobe, hepatic segment VI. Lesion is overall grossly similar in appearance, and most likely there is residual heterogeneous contrast enhancement of the posterior margin of the lesion as previously assessed. Recommend multiphasic contrast enhanced liver protocol CT for better assessment of this and other possible liver lesions. 2. No obvious new liver lesions. 3. Signal characteristics suggestive of hepatic parenchymal iron deposition. 4. Pancolonic diverticulosis. Electronically Signed   By: Jearld Lesch M.D.   On: 07/02/2023 10:40

## 2023-07-18 ENCOUNTER — Other Ambulatory Visit (HOSPITAL_COMMUNITY): Payer: Self-pay | Admitting: Hematology

## 2023-07-18 ENCOUNTER — Other Ambulatory Visit: Payer: Self-pay | Admitting: *Deleted

## 2023-07-18 MED ORDER — HYDROCODONE-ACETAMINOPHEN 5-325 MG PO TABS: 1.0000 | ORAL_TABLET | Freq: Three times a day (TID) | ORAL | 0 refills | Status: DC | PRN

## 2023-07-18 MED ORDER — TRAMADOL HCL 50 MG PO TABS: 50.0000 mg | ORAL_TABLET | Freq: Four times a day (QID) | ORAL | 0 refills | Status: DC | PRN

## 2023-07-19 ENCOUNTER — Encounter (HOSPITAL_COMMUNITY)
Admission: RE | Admit: 2023-07-19 | Discharge: 2023-07-19 | Disposition: A | Discharge status: Home / Self Care | Payer: Medicare Other | Source: Ambulatory Visit | Attending: Hematology | Admitting: Hematology

## 2023-07-19 DIAGNOSIS — C187 Malignant neoplasm of sigmoid colon: Secondary | ICD-10-CM | POA: Diagnosis present

## 2023-07-19 LAB — MOLECULAR PATHOLOGY

## 2023-07-19 MED ORDER — FLUDEOXYGLUCOSE F - 18 (FDG) INJECTION
10.9200 | Freq: Once | INTRAVENOUS | Status: AC | PRN
  Administered 2023-07-19: 10.92 via INTRAVENOUS

## 2023-07-26 ENCOUNTER — Ambulatory Visit: Payer: Medicare Other | Admitting: Hematology

## 2023-07-30 ENCOUNTER — Ambulatory Visit: Payer: Medicare Other | Admitting: Hematology

## 2023-08-02 ENCOUNTER — Inpatient Hospital Stay: Payer: Medicare Other

## 2023-08-02 ENCOUNTER — Inpatient Hospital Stay: Payer: Medicare Other | Attending: Hematology | Admitting: Hematology

## 2023-08-02 ENCOUNTER — Other Ambulatory Visit: Payer: Self-pay | Admitting: Hematology

## 2023-08-02 VITALS — BP 138/89 | HR 58 | Resp 16 | Wt 201.7 lb

## 2023-08-02 DIAGNOSIS — C187 Malignant neoplasm of sigmoid colon: Secondary | ICD-10-CM | POA: Diagnosis not present

## 2023-08-02 DIAGNOSIS — D631 Anemia in chronic kidney disease: Secondary | ICD-10-CM | POA: Insufficient documentation

## 2023-08-02 DIAGNOSIS — C787 Secondary malignant neoplasm of liver and intrahepatic bile duct: Secondary | ICD-10-CM

## 2023-08-02 DIAGNOSIS — I129 Hypertensive chronic kidney disease with stage 1 through stage 4 chronic kidney disease, or unspecified chronic kidney disease: Secondary | ICD-10-CM | POA: Diagnosis not present

## 2023-08-02 DIAGNOSIS — M545 Low back pain, unspecified: Secondary | ICD-10-CM | POA: Diagnosis not present

## 2023-08-02 DIAGNOSIS — D509 Iron deficiency anemia, unspecified: Secondary | ICD-10-CM

## 2023-08-02 DIAGNOSIS — N189 Chronic kidney disease, unspecified: Secondary | ICD-10-CM | POA: Insufficient documentation

## 2023-08-02 DIAGNOSIS — E1122 Type 2 diabetes mellitus with diabetic chronic kidney disease: Secondary | ICD-10-CM | POA: Insufficient documentation

## 2023-08-02 DIAGNOSIS — R918 Other nonspecific abnormal finding of lung field: Secondary | ICD-10-CM | POA: Insufficient documentation

## 2023-08-02 DIAGNOSIS — Z86718 Personal history of other venous thrombosis and embolism: Secondary | ICD-10-CM | POA: Diagnosis not present

## 2023-08-02 DIAGNOSIS — Z7901 Long term (current) use of anticoagulants: Secondary | ICD-10-CM | POA: Diagnosis not present

## 2023-08-02 LAB — CBC WITH DIFFERENTIAL/PLATELET
Abs Immature Granulocytes: 0.03 10*3/uL (ref 0.00–0.07)
Basophils Absolute: 0 10*3/uL (ref 0.0–0.1)
Basophils Relative: 0 %
Eosinophils Absolute: 0.2 10*3/uL (ref 0.0–0.5)
Eosinophils Relative: 2 %
HCT: 37 % — ABNORMAL LOW (ref 39.0–52.0)
Hemoglobin: 12.4 g/dL — ABNORMAL LOW (ref 13.0–17.0)
Immature Granulocytes: 0 %
Lymphocytes Relative: 10 %
Lymphs Abs: 1 10*3/uL (ref 0.7–4.0)
MCH: 33.5 pg (ref 26.0–34.0)
MCHC: 33.5 g/dL (ref 30.0–36.0)
MCV: 100 fL (ref 80.0–100.0)
Monocytes Absolute: 0.8 10*3/uL (ref 0.1–1.0)
Monocytes Relative: 9 %
Neutro Abs: 7.5 10*3/uL (ref 1.7–7.7)
Neutrophils Relative %: 79 %
Platelets: 179 10*3/uL (ref 150–400)
RBC: 3.7 MIL/uL — ABNORMAL LOW (ref 4.22–5.81)
RDW: 13 % (ref 11.5–15.5)
WBC: 9.6 10*3/uL (ref 4.0–10.5)
nRBC: 0 % (ref 0.0–0.2)

## 2023-08-02 LAB — IRON AND TIBC
Iron: 91 ug/dL (ref 45–182)
Saturation Ratios: 31 % (ref 17.9–39.5)
TIBC: 290 ug/dL (ref 250–450)
UIBC: 199 ug/dL

## 2023-08-02 LAB — FERRITIN: Ferritin: 142 ng/mL (ref 24–336)

## 2023-08-02 NOTE — Progress Notes (Signed)
 [facility] [address] [place] [address]    Clinic Day:  08/02/2023  Referring physician: Meade Bigness, MD  Patient Care Team: Meade Bigness, MD as PCP - General (Internal Medicine) Celestia Joesph SQUIBB, RN as Oncology Nurse Navigator (Medical Oncology) Rogers Hai, MD as Medical Oncologist (Medical Oncology)   ASSESSMENT & PLAN:   Assessment: 1. Stage II (T3N0) sigmoid colon cancer, MMR preserved: - Anemia with hemoglobin of 8, recent admission with diverticulitis.  He also had weight loss of 40 pounds in the last 6 months. - Colonoscopy by Dr. Eartha (01/10/2022): Fungating and ulcerated partially obstructing mass found at 42 cm proximal to the anus.  Mass is circumferential and measured 3 cm in length. - Pathology: Invasive moderately differentiated adenocarcinoma of the descending colon mass.  Transverse colon polypectomy shows fragments of at least intramucosal adenocarcinoma and high-grade dysplasia. - CT CAP (01/13/2022): No evidence of metastatic disease in the chest, abdomen or pelvis.  Several tiny lung nodules and scattered areas of interstitial irregularity in the right upper lobe likely postinflammatory. - Preoperative CEA (01/10/2022): 7.9. - Left hemicolectomy (02/17/2022): Moderately differentiated grade 2 adenocarcinoma, no perforation, no lymphovascular/perineural invasion, margins negative.  0/21 lymph nodes involved.  PT3 pN0. - PET scan on 11/09/2022: 3.2 x 2.3 cm right hepatic lobe metastasis. - MRI of the liver on 11/22/2022: Isolated inferior right hepatic lobe 2.8 cm. - Right lobe lesion thermal ablation on 12/27/2022.   2. Social/family history: - Lives at home with his wife.  Wife works at [employer] nephrology office in [place].  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  Quit smoking in 1995.  Smoked 1 to 2 packs/day for 25 years. - Son had DVT.  Son is also a carrier for factor V Leiden.  Mother had  bladder cancer and uterine cancer.   3.  Left leg DVT: - Patient had a left leg DVT approximately 2 years ago.  His wife thinks that DVT has happened about 4 to 6 weeks after his second COVID infection.  He was very minimally symptomatic from the COVID infection.  At the time of the DVT, he was completely mobile and did not have any surgery. - Patient was heterozygous for factor V Leiden as checked by Dr. Devonda on 09/20/2011. - I have recommended that he get the records of timing of DVT and the COVID test.  If it is within 4 to 6 weeks of his COVID diagnosis, we may safely discontinue anticoagulation considering it can be provoked by COVID infection.  If it is unprovoked DVT, may continue low intensity anticoagulation.   4.  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 - Adjuvant XRT 50 Gray, completed 08/11/2015.    Plan: 1.  Stage II (T3N0) sigmoid colon cancer: - MRI abdomen (07/02/2023): Limited by breathing motion artifact but without any major significant changes or new lesions.  Most likely there is a residual heterogeneous contrast-enhancement of the posterior margin of the lesion. - We reviewed PET scan from 07/19/2023: 3.7 cm right hepatic metastasis reflecting viable tumor.  No evidence of metastatic disease. - I will reach out to Dr. Luverne for liver directed therapy.  We will also send a CEA level as a baseline today. - RTC 8 weeks for follow-up.   2.  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 - He has been taking iron tablet  daily.  Will check ferritin and iron panel today.   3.  Left leg DVT: - He will continue Eliquis .  No bleeding issues reported.  4.  Low back pain: - His back pain is better since he started physical therapy. - He is taking hydrocodone  at nighttime and tramadol  during daytime.    Orders Placed This Encounter  Procedures  . Iron and  TIBC ([facility] ONLY)    Standing Status:   Future    Number of Occurrences:   1    Expected Date:   08/02/2023    Expiration Date:   08/01/2024  . Ferritin    Standing Status:   Future    Number of Occurrences:   1    Expected Date:   08/02/2023    Expiration Date:   08/01/2024  . CBC with Differential    Standing Status:   Future    Number of Occurrences:   1    Expected Date:   08/02/2023    Expiration Date:   08/01/2024  . CEA    Standing Status:   Future    Number of Occurrences:   1    Expected Date:   08/02/2023    Expiration Date:   08/01/2024      LILLETTE Verneta SAUNDERS Teague,acting as a scribe for Alean Stands, MD.,have documented all relevant documentation on the behalf of Alean Stands, MD,as directed by  Alean Stands, MD while in the presence of Alean Stands, MD.  I, Alean Stands MD, have reviewed the above documentation for accuracy and completeness, and I agree with the above.     Alean Stands, MD   1/2/20252:31 PM  CHIEF COMPLAINT:   Diagnosis: sigmoid colon cancer and iron deficiency anemia    Cancer Staging  Cancer of sigmoid colon [facility]) Staging form: Colon and Rectum, AJCC 8th Edition - Clinical stage from 01/25/2022: Stage IIA (cT3, cN0, cM0) - Signed by Stands Alean, MD on 03/16/2022    Prior Therapy: 1. Left hemicolectomy on 02/17/2022  2. Microwave ablation of liver metastasis, 12/27/22  Current Therapy:  Surveillance    HISTORY OF PRESENT ILLNESS:   Oncology History  Cancer of sigmoid colon (HCC)  01/25/2022 Initial Diagnosis   Cancer of sigmoid colon (HCC)   01/25/2022 Cancer Staging   Staging form: Colon and Rectum, AJCC 8th Edition - Clinical stage from 01/25/2022: Stage IIA (cT3, cN0, cM0) - Signed by Stands Alean, MD on 03/16/2022 Histopathologic type: Adenocarcinoma, NOS Stage prefix: Initial diagnosis Total positive nodes: 0 Total nodes examined: 21 Histologic grade (G):  G2 Histologic grading system: 4 grade system Microsatellite instability (MSI): Stable      INTERVAL HISTORY:   Arris is a 89 y.o. male presenting to clinic today for follow up of sigmoid colon cancer and iron deficiency anemia. He was last seen by me on 07/12/23.  Since his last visit, he underwent restaging PET on 07/19/23 that found: 3.7 cm right hepatic metastasis, reflecting viable tumor status post ablation and otherwise no evidence of metastatic disease.  Today, he states that he is doing well overall. His appetite level is at 70%. His energy level is at 70%. He is accompanied by his wife.   He reports his back pain has improved and has not taken any pain medication for the past couple of days. He was previously taking Tramadol  during the day and Norco at night. He has done physical therapy and got a back brace to improve pain. He is taking 1 iron  tablet daily and denies tiredness.  PAST MEDICAL HISTORY:   Past Medical History: Past Medical History:  Diagnosis Date  . Adenomatous colon polyp   . Anemia   . Carotid bruit   . Chronic dyspnea   . Diabetes (HCC)    type 2  . Diverticular disease   . DVT (deep venous thrombosis) (HCC)    lower limb  . Dyspnea   . Factor V deficiency (HCC)   . Hearing loss    bilateral  . Hyperlipemia   . Hypertension   . Inguinal hernia   . Kidney disease    stage 3  . Lung nodule   . Lymphadenopathy   . Malignant neoplasm (HCC)    salivary gland  . Murmur   . Osteoarthritis   . Senile purpura [facility])     Surgical History: Past Surgical History:  Procedure Laterality Date  . BIOPSY  01/10/2022   Procedure: BIOPSY;  Surgeon: Eartha Angelia Sieving, MD;  [facility];  Service: Gastroenterology;;  . COLONOSCOPY WITH PROPOFOL  N/A 01/10/2022   Procedure: COLONOSCOPY WITH PROPOFOL ;  Surgeon: Eartha Angelia Sieving, MD;  [facility];  Service: Gastroenterology;  Laterality: N/A;  per Anette Caldron, pt to arrive  at 9:15  . COLONOSCOPY WITH PROPOFOL  N/A 02/27/2023   Procedure: COLONOSCOPY WITH PROPOFOL ;  Surgeon: Eartha Angelia Sieving, MD;  [facility];  Service: Gastroenterology;  Laterality: N/A;  9:15am;asa 3  . ESOPHAGOGASTRODUODENOSCOPY (EGD) WITH PROPOFOL  N/A 01/10/2022   Procedure: ESOPHAGOGASTRODUODENOSCOPY (EGD) WITH PROPOFOL ;  Surgeon: Eartha Angelia Sieving, MD;  [facility];  Service: Gastroenterology;  Laterality: N/A;  . HAND / FINGER LESION EXCISION    . HERNIA REPAIR    . IR RADIOLOGIST EVAL & MGMT  04/04/2023  . MASTOIDECTOMY    . PILONIDAL CYST EXCISION    . POLYPECTOMY  01/10/2022   Procedure: POLYPECTOMY;  Surgeon: Eartha Angelia Sieving, MD;  [facility];  Service: Gastroenterology;;  . POLYPECTOMY  02/27/2023   Procedure: POLYPECTOMY;  Surgeon: Eartha Angelia Sieving, MD;  [facility];  Service: Gastroenterology;;  . RADIOLOGY WITH ANESTHESIA N/A 12/27/2022   Procedure: CT microwave ablation of the liver;  Surgeon: Luverne Aran, MD;  Location: [facility];  Service: Radiology;  Laterality: N/A;  . SUBMUCOSAL TATTOO INJECTION  01/10/2022   Procedure: SUBMUCOSAL TATTOO INJECTION;  Surgeon: Eartha Angelia Sieving, MD;  [facility];  Service: Gastroenterology;;  . SURGERY OF LIP     skin cancer    Social History: Social History   Socioeconomic History  . Marital status: Married    Spouse name: Not on file  . Number of children: Not on file  . Years of education: Not on file  . Highest education level: Not on file  Occupational History  . Not on file  Tobacco Use  . Smoking status: Former    Current packs/day: 0.00    Average packs/day: 2.0 packs/day for 25.0 years (50.0 ttl pk-yrs)    Types: Cigarettes    Start date: 39    Quit date: 28    Years since quitting: 30.0    Passive exposure: Past  . Smokeless tobacco: Never  Vaping Use  . Vaping status: Never Used  Substance and Sexual Activity   . Alcohol use: Never  . Drug use: Never  . Sexual activity: Not on file  Other Topics Concern  . Not on file  Social History Narrative  . Not on file  Social Drivers of Corporate Investment Banker Strain: Not on file  Food Insecurity: No Food Insecurity (02/07/2023)   Hunger Vital Sign   . Worried About Programme Researcher, Broadcasting/film/video in the Last Year: Never true   . Ran Out of Food in the Last Year: Never true  Transportation Needs: No Transportation Needs (02/07/2023)   PRAPARE - Transportation   . Lack of Transportation (Medical): No   . Lack of Transportation (Non-Medical): No  Physical Activity: Not on file  Stress: Not on file  Social Connections: Not on file  Intimate Partner Violence: Not At Risk (02/07/2023)   Humiliation, Afraid, Rape, and Kick questionnaire   . Fear of Current or Ex-Partner: No   . Emotionally Abused: No   . Physically Abused: No   . Sexually Abused: No    Family History: No family history on file.  Current Medications:  Current Outpatient Medications:  .  albuterol  (VENTOLIN  HFA) 108 (90 Base) MCG/ACT inhaler, Inhale into the lungs every 6 (six) hours as needed for wheezing or shortness of breath., Disp: , Rfl:  .  amitriptyline  (ELAVIL ) 10 MG tablet, Take 10 mg by mouth every evening., Disp: , Rfl:  .  amLODipine  (NORVASC ) 5 MG tablet, Take 5 mg by mouth 2 (two) times daily., Disp: , Rfl:  .  ascorbic acid  (VITAMIN C /NATURAL ROSE HIPS) 1000 MG tablet, Take 1,000 mg by mouth every evening., Disp: , Rfl:  .  atorvastatin  (LIPITOR) 40 MG tablet, Take 40 mg by mouth every evening., Disp: , Rfl:  .  carvedilol  (COREG ) 6.25 MG tablet, Take 6.25 mg by mouth 2 (two) times daily with a meal., Disp: , Rfl:  .  Cholecalciferol  (VITAMIN D3) 125 MCG (5000 UT) CAPS, Take 5,000 Units by mouth every evening., Disp: , Rfl:  .  dorzolamide  (TRUSOPT ) 2 % ophthalmic solution, Place 1 drop into the left eye 2 (two) times daily., Disp: , Rfl:  .  ELIQUIS  5 MG TABS tablet, TAKE  1 TABLET(5 MG) BY MOUTH TWICE DAILY, Disp: 60 tablet, Rfl: 3 .  ferrous sulfate  325 (65 FE) MG tablet, Take 325 mg by mouth daily with breakfast., Disp: , Rfl:  .  folic acid  (FOLVITE ) 800 MCG tablet, Take 800 mcg by mouth every evening. With B12 Now (Brand), Disp: , Rfl:  .  furosemide (LASIX) 20 MG tablet, Take 20 mg by mouth. As needed., Disp: , Rfl:  .  hydrALAZINE  (APRESOLINE ) 25 MG tablet, Take 50 mg by mouth in the morning and at bedtime., Disp: , Rfl:  .  HYDROcodone -acetaminophen  (NORCO) 5-325 MG tablet, Take 1 tablet by mouth every 8 (eight) hours as needed for moderate pain (pain score 4-6)., Disp: 30 tablet, Rfl: 0 .  KERENDIA  10 MG TABS, Take 10 mg by mouth daily., Disp: , Rfl:  .  latanoprost  (XALATAN ) 0.005 % ophthalmic solution, Place 1 drop into both eyes at bedtime., Disp: , Rfl:  .  linagliptin  (TRADJENTA ) 5 MG TABS tablet, Take 5 mg by mouth in the morning., Disp: , Rfl:  .  loratadine  (CLARITIN ) 10 MG tablet, Take 10 mg by mouth every morning., Disp: , Rfl:  .  LORazepam  (ATIVAN ) 0.5 MG tablet, Take 0.5 mg by mouth 2 (two) times daily as needed for anxiety., Disp: , Rfl:  .  Misc Natural Products (PROSTATE SUPPORT PO), Take 1 capsule by mouth every evening. NOW Prostate Support, Disp: , Rfl:  .  omeprazole  (PRILOSEC) 40 MG capsule, Take 1 capsule (40 mg total)  by mouth daily., Disp: 90 capsule, Rfl: 3 .  OVER THE COUNTER MEDICATION, Now Methyl b-12 1000 mcg daily Now prostate support soft gel daily, Disp: , Rfl:  .  predniSONE  (DELTASONE ) 50 MG tablet, Take 1 tablet 13 hours prior to scan, 7 hours prior to scan and 1 hour prior to scan for contrast allergy Take 50 mg Benadryl  1 hour prior to scan, Disp: 3 tablet, Rfl: 0 .  sucralfate  (CARAFATE ) 1 GM/10ML suspension, Take 10 mLs (1 g total) by mouth 4 (four) times daily., Disp: 420 mL, Rfl: 1 .  traMADol  (ULTRAM ) 50 MG tablet, Take 1 tablet (50 mg total) by mouth every 6 (six) hours as needed., Disp: 30 tablet, Rfl: 0 .  vitamin  B-12 (CYANOCOBALAMIN ) 1000 MCG tablet, Take 1,000 mcg by mouth every evening. Now (Brand), Disp: , Rfl:    Allergies: Allergies  Allergen Reactions  . Gadobenate Nausea And Vomiting    Immediately upon the infusion of 15mL multihance contrast  Patient had exteme nausea and vomiting.   No other symptoms noted .  No injury.  MRI scan was completed after patient felt better.   Immediately upon the infusion of 15mL multihance contrast  Patient had exteme nausea and vomiting.   No other symptoms noted .  No injury.  MRI scan was completed after patient felt better.       REVIEW OF SYSTEMS:   Review of Systems  Constitutional:  Negative for chills, fatigue and fever.  HENT:   Negative for lump/mass, mouth sores, nosebleeds, sore throat and trouble swallowing.   Eyes:  Negative for eye problems.  Respiratory:  Positive for shortness of breath. Negative for cough.   Cardiovascular:  Negative for chest pain, leg swelling and palpitations.  Gastrointestinal:  Negative for abdominal pain, constipation, diarrhea, nausea and vomiting.  Genitourinary:  Negative for bladder incontinence, difficulty urinating, dysuria, frequency, hematuria and nocturia.   Musculoskeletal:  Positive for back pain (7/10 severity). Negative for arthralgias, flank pain, myalgias and neck pain.  Skin:  Negative for itching and rash.  Neurological:  Negative for dizziness, headaches and numbness.       +tingling feet  Hematological:  Does not bruise/bleed easily.  Psychiatric/Behavioral:  Positive for sleep disturbance. Negative for depression and suicidal ideas. The patient is not nervous/anxious.   All other systems reviewed and are negative.    VITALS:   Blood pressure 138/89, pulse (!) 58, resp. rate 16, weight 201 lb 11.5 oz (91.5 kg), SpO2 96%.  Wt Readings from Last 3 Encounters:  08/02/23 201 lb 11.5 oz (91.5 kg)  07/12/23 209 lb 12.8 oz (95.2 kg)  05/21/23 214 lb 1.6 oz (97.1 kg)    Body mass index is 28.94  kg/m.  Performance status (ECOG): 1 - Symptomatic but completely ambulatory  PHYSICAL EXAM:   Physical Exam Vitals and nursing note reviewed. Exam conducted with a chaperone present.  Constitutional:      Appearance: Normal appearance.  Cardiovascular:     Rate and Rhythm: Normal rate and regular rhythm.     Pulses: Normal pulses.     Heart sounds: Normal heart sounds.  Pulmonary:     Effort: Pulmonary effort is normal.     Breath sounds: Normal breath sounds.  Abdominal:     Palpations: Abdomen is soft. There is no hepatomegaly, splenomegaly or mass.     Tenderness: There is no abdominal tenderness.  Musculoskeletal:     Right lower leg: No edema.     Left  lower leg: No edema.  Lymphadenopathy:     Cervical: No cervical adenopathy.     Right cervical: No superficial, deep or posterior cervical adenopathy.    Left cervical: No superficial, deep or posterior cervical adenopathy.     Upper Body:     Right upper body: No supraclavicular or axillary adenopathy.     Left upper body: No supraclavicular or axillary adenopathy.  Neurological:     General: No focal deficit present.     Mental Status: He is alert and oriented to person, place, and time.  Psychiatric:        Mood and Affect: Mood normal.        Behavior: Behavior normal.    LABS:      Latest Ref Rng & Units 06/27/2023    9:18 AM 03/22/2023    8:09 AM 02/08/2023    4:27 AM  CBC  WBC 4.0 - 10.5 K/uL 7.7  6.8  9.7   Hemoglobin 13.0 - 17.0 g/dL 87.1  87.1  89.5   Hematocrit 39.0 - 52.0 % 37.2  37.7  30.3   Platelets 150 - 400 K/uL 176  203  135       Latest Ref Rng & Units 06/27/2023    9:18 AM 03/22/2023    8:09 AM 02/08/2023    4:27 AM  CMP  Glucose 70 - 99 mg/dL 855  797  87   BUN 8 - 23 mg/dL 37  35  51   Creatinine 0.61 - 1.24 mg/dL 8.62  8.65  8.07   Sodium 135 - 145 mmol/L 140  137  133   Potassium 3.5 - 5.1 mmol/L 4.1  4.2  3.2   Chloride 98 - 111 mmol/L 110  106  104   CO2 22 - 32 mmol/L 22  21   21    Calcium  8.9 - 10.3 mg/dL 9.1  9.0  8.2   Total Protein 6.5 - 8.1 g/dL 6.4  7.2    Total Bilirubin <1.2 mg/dL 2.0  1.2    Alkaline Phos 38 - 126 U/L 104  113    AST 15 - 41 U/L 27  29    ALT 0 - 44 U/L 42  43       Lab Results  Component Value Date   CEA1 46.1 (H) 06/27/2023   /  CEA  Date Value Ref Range Status  06/27/2023 46.1 (H) 0.0 - 4.7 ng/mL Final    Comment:    (NOTE)                             Nonsmokers          <3.9                             Smokers             <5.6 Roche [employer]ostics Electrochemiluminescence Immunoassay (ECLIA) Values obtained with different assay methods or kits cannot be used interchangeably.  Results cannot be interpreted as absolute evidence of the presence or absence of malignant disease. Performed At: [facility] [address] [place], [place] [id number] Jennette Shorter MD Ey:[phone]    No results found for: PSA1 No results found for: CAN199 No results found for: RJW874  Lab Results  Component Value Date   TOTALPROTELP 6.3 01/25/2022   ALBUMINELP 3.6 01/25/2022   A1GS 0.2 01/25/2022  A2GS 0.7 01/25/2022   BETS 0.9 01/25/2022   GAMS 0.9 01/25/2022   MSPIKE Not Observed 01/25/2022   SPEI Comment 01/25/2022   Lab Results  Component Value Date   TIBC 327 06/27/2023   TIBC 325 03/22/2023   TIBC 331 10/19/2022   FERRITIN 87 06/27/2023   FERRITIN 96 03/22/2023   FERRITIN 53 10/19/2022   IRONPCTSAT 43 (H) 06/27/2023   IRONPCTSAT 32 03/22/2023   IRONPCTSAT 52 (H) 10/19/2022   No results found for: LDH   STUDIES:   NM PET Image Restag (PS) Skull Base To Thigh Result Date: 07/28/2023 CLINICAL DATA:  Subsequent treatment strategy for metastatic colon cancer, status post liver ablation. EXAM: NUCLEAR MEDICINE PET SKULL BASE TO THIGH TECHNIQUE: 10.9 mCi F-18 FDG was injected intravenously. Full-ring PET imaging was performed from the skull base to thigh after the radiotracer. CT data was obtained and used  for attenuation correction and anatomic localization. Fasting blood glucose: 156 mg/dl COMPARISON:  MRI abdomen dated 07/02/2023. CT chest dated 07/02/2023. PET-CT dated 11/13/2022. FINDINGS: Mediastinal blood pool activity: SUV max 2.7 Liver activity: SUV max NA NECK: No hypermetabolic lymph nodes in the neck. Incidental CT findings: None. CHEST: No hypermetabolic mediastinal or hilar nodes. No suspicious pulmonary nodules on the CT scan. Prior subcutaneous lesion in the left posterior chest is no longer evident. Incidental CT findings: Atherosclerotic calcifications of the aortic arch. Mild three-vessel coronary atherosclerosis. ABDOMEN/PELVIS: Status post left hemicolectomy with suture line in the left upper abdomen (series 202/image 97). 3.7 cm metastasis inferiorly in segment 6 (series 202/image 96), max SUV 11.6, previously 3.1 cm on recent MR. While this reflects the site of prior hepatic ablation, the continued hypermetabolism supports the suspected viable tumor. No hypermetabolic abdominopelvic lymphadenopathy. Incidental CT findings: Right renal cysts. Extensive left colonic diverticulosis, without evidence of diverticulitis. Small right paramidline ventral hernia containing a nondilated loop of small bowel (series 202/image 116). Prostatomegaly with dystrophic calcifications. Tiny fat containing left inguinal hernia. Atherosclerotic calcifications of the abdominal aorta and branch vessels. SKELETON: No focal hypermetabolic activity to suggest skeletal metastasis. Incidental CT findings: Degenerative changes of the visualized thoracolumbar spine. IMPRESSION: Status post left hemicolectomy. 3.7 cm right hepatic metastasis, reflecting viable tumor status post ablation. Otherwise, no evidence of metastatic disease. Electronically Signed   By: Pinkie Pebbles M.D.   On: 07/28/2023 03:49

## 2023-08-02 NOTE — Patient Instructions (Addendum)
 [facility] at [facility] Discharge Instructions   You were seen and examined today by Dr. Rogers.  He reviewed the results of your PET scan. It is showing the known spot on the liver that was seen on the MRI.   Dr. MARLA will reach out to Dr. Luverne to see if this liver lesion may be retreated.   We will see you back in 8 weeks. We will repeat lab work at that time.   Return as scheduled.    Thank you for choosing [facility] at [facility] to provide your oncology and hematology care.  To afford each patient quality time with our provider, please arrive at least 15 minutes before your scheduled appointment time.   If you have a lab appointment with the Cancer Center please come in thru the Main Entrance and check in at the main information desk.  You need to re-schedule your appointment should you arrive 10 or more minutes late.  We strive to give you quality time with our providers, and arriving late affects you and other patients whose appointments are after yours.  Also, if you no show three or more times for appointments you may be dismissed from the clinic at the providers discretion.     Again, thank you for choosing [facility].  Our hope is that these requests will decrease the amount of time that you wait before being seen by our physicians.       _____________________________________________________________  Should you have questions after your visit to [facility], please contact our office at [phone] and follow the prompts.  Our office hours are 8:00 a.m. and 4:30 p.m. Monday - Friday.  Please note that voicemails left after 4:00 p.m. may not be returned until the following business day.  We are closed weekends and major holidays.  You do have access to a nurse 24-7, just call the main number to the clinic [phone] and do not press any options, hold on the line and a nurse will answer the phone.     For prescription refill requests, have your pharmacy contact our office and allow 72 hours.    Due to Covid, you will need to wear a mask upon entering the hospital. If you do not have a mask, a mask will be given to you at the Main Entrance upon arrival. For doctor visits, patients may have 1 support person age 51 or older with them. For treatment visits, patients can not have anyone with them due to social distancing guidelines and our immunocompromised population.

## 2023-08-03 LAB — CEA: CEA: 66.6 ng/mL — ABNORMAL HIGH (ref 0.0–4.7)

## 2023-08-09 ENCOUNTER — Ambulatory Visit
Admission: RE | Admit: 2023-08-09 | Discharge: 2023-08-09 | Disposition: A | Discharge status: Home / Self Care | Payer: Medicare Other | Source: Ambulatory Visit | Attending: Hematology | Admitting: Hematology

## 2023-08-09 DIAGNOSIS — C189 Malignant neoplasm of colon, unspecified: Secondary | ICD-10-CM

## 2023-08-09 DIAGNOSIS — C187 Malignant neoplasm of sigmoid colon: Secondary | ICD-10-CM

## 2023-08-09 HISTORY — PX: IR RADIOLOGIST EVAL & MGMT: IMG5224

## 2023-08-09 NOTE — Progress Notes (Signed)
 Chief Complaint: Patient was consulted remotely today (TeleHealth) for follow-up after microwave ablation of a colorectal liver metastasis on 12/27/2022.   History of Present Illness: Herbert Wood is a 89 y.o. 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 He underwent percutaneous microwave thermal ablation of the lesion on 12/27/2022. At the time of the procedure the lesion measured approximately 2.5 x 3.4 cm by unenhanced CT.   Follow up CEA on 06/27/23 showed significant increase to 46.1 from 11.6 in August. MRI on 07/02/23 was degraded by respiratory motion, but suggested heterogenous enhancement of the posterior margin of the previously ablated lesion. PET scan was performed on 07/19/23 demonstrating significant crescentic hypermetabolism along the posterior margin of prior ablation with SUV of 11.6. CEA has continued to rise to 66.6 on 08/02/23.   Herbert Wood denies abdominal pain or symptoms. He has been dealing with low back pain for roughly one month and is undergoing physical therapy currently which has helped with pain.   Past Medical History:  Diagnosis Date   Adenomatous colon polyp    Anemia    Carotid bruit    Chronic dyspnea    Diabetes (HCC)    type 2   Diverticular disease    DVT (deep venous thrombosis) (HCC)    lower limb   Dyspnea    Factor V deficiency (HCC)    Hearing loss    bilateral   Hyperlipemia    Hypertension    Inguinal hernia    Kidney disease    stage 3   Lung nodule    Lymphadenopathy    Malignant neoplasm (HCC)    salivary gland   Murmur    Osteoarthritis    Senile purpura (HCC)     Past Surgical History:  Procedure Laterality Date   BIOPSY  01/10/2022   Procedure: BIOPSY;  Surgeon: Eartha Angelia Sieving, MD;  [facility];  Service: Gastroenterology;;   COLONOSCOPY WITH PROPOFOL  N/A 01/10/2022   Procedure:  COLONOSCOPY WITH PROPOFOL ;  Surgeon: Eartha Angelia Sieving, MD;  [facility]: [facility];  Service: Gastroenterology;  Laterality: N/A;  per Anette Caldron, pt to arrive at 9:15   COLONOSCOPY WITH PROPOFOL  N/A 02/27/2023   Procedure: COLONOSCOPY WITH PROPOFOL ;  Surgeon: Eartha Angelia Sieving, MD;  [facility]: [facility];  Service: Gastroenterology;  Laterality: N/A;  9:15am;asa 3   ESOPHAGOGASTRODUODENOSCOPY (EGD) WITH PROPOFOL  N/A 01/10/2022   Procedure: ESOPHAGOGASTRODUODENOSCOPY (EGD) WITH PROPOFOL ;  Surgeon: Eartha Angelia Sieving, MD;  [facility]: [facility];  Service: Gastroenterology;  Laterality: N/A;   HAND / FINGER LESION EXCISION     HERNIA REPAIR     IR RADIOLOGIST EVAL & MGMT  04/04/2023   MASTOIDECTOMY     PILONIDAL CYST EXCISION     POLYPECTOMY  01/10/2022   Procedure: POLYPECTOMY;  Surgeon: Eartha Angelia Sieving, MD;  [facility];  Service: Gastroenterology;;   POLYPECTOMY  02/27/2023   Procedure: POLYPECTOMY;  Surgeon: Eartha Angelia Sieving, MD;  [facility]: [facility];  Service: Gastroenterology;;   RADIOLOGY WITH ANESTHESIA N/A 12/27/2022   Procedure: CT microwave ablation of the liver;  Surgeon: Luverne Aran, MD;  Location: [facility];  Service: Radiology;  Laterality: N/A;   SUBMUCOSAL TATTOO INJECTION  01/10/2022   Procedure: SUBMUCOSAL TATTOO INJECTION;  Surgeon: Eartha Angelia Sieving, MD;  Location: [facility];  Service: Gastroenterology;;   SURGERY OF LIP  skin cancer    Allergies: Gadobenate  Medications: Prior to Admission medications   Medication Sig Start Date End Date Taking? Authorizing Provider  albuterol  (VENTOLIN  HFA) 108 (90 Base) MCG/ACT inhaler Inhale into the lungs every 6 (six) hours as needed for wheezing or shortness of breath.    [provider]  amitriptyline  (ELAVIL ) 10 MG tablet Take 10 mg by mouth every evening.    [provider]  amLODipine  (NORVASC ) 5 MG tablet Take 5 mg by mouth 2 (two)  times daily.    [provider]  ascorbic acid  (VITAMIN C /NATURAL ROSE HIPS) 1000 MG tablet Take 1,000 mg by mouth every evening.    [provider]  atorvastatin  (LIPITOR) 40 MG tablet Take 40 mg by mouth every evening.    [provider]  carvedilol  (COREG ) 6.25 MG tablet Take 6.25 mg by mouth 2 (two) times daily with a meal.    [provider]  Cholecalciferol  (VITAMIN D3) 125 MCG (5000 UT) CAPS Take 5,000 Units by mouth every evening.    [provider]  dorzolamide  (TRUSOPT ) 2 % ophthalmic solution Place 1 drop into the left eye 2 (two) times daily.    [provider]  ELIQUIS  5 MG TABS tablet TAKE 1 TABLET(5 MG) BY MOUTH TWICE DAILY 06/05/23   Rogers Hai, MD  ferrous sulfate  325 (65 FE) MG tablet Take 325 mg by mouth daily with breakfast.    [provider]  folic acid  (FOLVITE ) 800 MCG tablet Take 800 mcg by mouth every evening. With B12 Now (Brand)    [provider]  furosemide (LASIX) 20 MG tablet Take 20 mg by mouth. As needed.    [provider]  hydrALAZINE  (APRESOLINE ) 25 MG tablet Take 50 mg by mouth in the morning and at bedtime.    [provider]  HYDROcodone -acetaminophen  (NORCO) 5-325 MG tablet Take 1 tablet by mouth every 8 (eight) hours as needed for moderate pain (pain score 4-6). 07/18/23   Rogers Hai, MD  KERENDIA  10 MG TABS Take 10 mg by mouth daily.    [provider]  latanoprost  (XALATAN ) 0.005 % ophthalmic solution Place 1 drop into both eyes at bedtime.    [provider]  linagliptin  (TRADJENTA ) 5 MG TABS tablet Take 5 mg by mouth in the morning.    [provider]  loratadine  (CLARITIN ) 10 MG tablet Take 10 mg by mouth every morning.    [provider]  LORazepam  (ATIVAN ) 0.5 MG tablet Take 0.5 mg by mouth 2 (two) times daily as needed for anxiety.    [provider]  Misc Natural Products (PROSTATE SUPPORT PO)  Take 1 capsule by mouth every evening. NOW Prostate Support    [provider]  omeprazole  (PRILOSEC) 40 MG capsule Take 1 capsule (40 mg total) by mouth daily. 05/21/23   Carlan, Mitzie CROME, NP  OVER THE COUNTER MEDICATION Now Methyl b-12 1000 mcg daily Now prostate support soft gel daily    [provider]  predniSONE  (DELTASONE ) 50 MG tablet Take 1 tablet 13 hours prior to scan, 7 hours prior to scan and 1 hour prior to scan for contrast allergy Take 50 mg Benadryl  1 hour prior to scan 06/27/23   Rogers Hai, MD  sucralfate  (CARAFATE ) 1 GM/10ML suspension Take 10 mLs (1 g total) by mouth 4 (four) times daily. 02/05/23   Carlan, Chelsea L, NP  traMADol  (ULTRAM ) 50 MG tablet Take 1 tablet (50 mg total) by mouth every  6 (six) hours as needed. 07/18/23   Rogers Hai, MD  vitamin B-12 (CYANOCOBALAMIN ) 1000 MCG tablet Take 1,000 mcg by mouth every evening. Now Mitchael)    [provider]     No family history on file.  Social History   Socioeconomic History   Marital status: Married    Spouse name: Not on file   Number of children: Not on file   Years of education: Not on file   Highest education level: Not on file  Occupational History   Not on file  Tobacco Use   Smoking status: Former    Current packs/day: 0.00    Average packs/day: 2.0 packs/day for 25.0 years (50.0 ttl pk-yrs)    Types: Cigarettes    Start date: 25    Quit date: 67    Years since quitting: 30.0    Passive exposure: Past   Smokeless tobacco: Never  Vaping Use   Vaping status: Never Used  Substance and Sexual Activity   Alcohol use: Never   Drug use: Never   Sexual activity: Not on file  Other Topics Concern   Not on file  Social History Narrative   Not on file   Social Drivers of Health   Financial Resource Strain: Not on file  Food Insecurity: No Food Insecurity (02/07/2023)   Hunger Vital Sign    Worried About Running Out of Food in the Last Year: Never  true    Ran Out of Food in the Last Year: Never true  Transportation Needs: No Transportation Needs (02/07/2023)   PRAPARE - Administrator, Civil Service (Medical): No    Lack of Transportation (Non-Medical): No  Physical Activity: Not on file  Stress: Not on file  Social Connections: Not on file    ECOG Status: 0 - Asymptomatic  Review of Systems  Constitutional: Negative.   Respiratory: Negative.    Cardiovascular: Negative.   Gastrointestinal: Negative.   Musculoskeletal:  Positive for back pain.  Neurological: Negative.     Review of Systems: A 12 point ROS discussed and pertinent positives are indicated in the HPI above.  All other systems are negative.   Physical Exam No direct physical exam was performed (except for noted visual exam findings with Video Visits).   Vital Signs: There were no vitals taken for this visit.  Imaging: NM PET Image Restag (PS) Skull Base To Thigh Result Date: 07/28/2023 CLINICAL DATA:  Subsequent treatment strategy for metastatic colon cancer, status post liver ablation. EXAM: NUCLEAR MEDICINE PET SKULL BASE TO THIGH TECHNIQUE: 10.9 mCi F-18 FDG was injected intravenously. Full-ring PET imaging was performed from the skull base to thigh after the radiotracer. CT data was obtained and used for attenuation correction and anatomic localization. Fasting blood glucose: 156 mg/dl COMPARISON:  MRI abdomen dated 07/02/2023. CT chest dated 07/02/2023. PET-CT dated 11/13/2022. FINDINGS: Mediastinal blood pool activity: SUV max 2.7 Liver activity: SUV max NA NECK: No hypermetabolic lymph nodes in the neck. Incidental CT findings: None. CHEST: No hypermetabolic mediastinal or hilar nodes. No suspicious pulmonary nodules on the CT scan. Prior subcutaneous lesion in the left posterior chest is no longer evident. Incidental CT findings: Atherosclerotic calcifications of the aortic arch. Mild three-vessel coronary atherosclerosis. ABDOMEN/PELVIS: Status  post left hemicolectomy with suture line in the left upper abdomen (series 202/image 97). 3.7 cm metastasis inferiorly in segment 6 (series 202/image 96), max SUV 11.6, previously 3.1 cm on recent MR. While this reflects the site of prior hepatic ablation,  the continued hypermetabolism supports the suspected viable tumor. No hypermetabolic abdominopelvic lymphadenopathy. Incidental CT findings: Right renal cysts. Extensive left colonic diverticulosis, without evidence of diverticulitis. Small right paramidline ventral hernia containing a nondilated loop of small bowel (series 202/image 116). Prostatomegaly with dystrophic calcifications. Tiny fat containing left inguinal hernia. Atherosclerotic calcifications of the abdominal aorta and branch vessels. SKELETON: No focal hypermetabolic activity to suggest skeletal metastasis. Incidental CT findings: Degenerative changes of the visualized thoracolumbar spine. IMPRESSION: Status post left hemicolectomy. 3.7 cm right hepatic metastasis, reflecting viable tumor status post ablation. Otherwise, no evidence of metastatic disease. Electronically Signed   By: Pinkie Pebbles M.D.   On: 07/28/2023 03:49    Labs:  CBC: Recent Labs    02/08/23 0427 03/22/23 0809 06/27/23 0918 08/02/23 1420  WBC 9.7 6.8 7.7 9.6  HGB 10.4* 12.8* 12.8* 12.4*  HCT 30.3* 37.7* 37.2* 37.0*  PLT 135* 203 176 179    COAGS: Recent Labs    12/27/22 0830 02/07/23 1352  INR 1.1 1.8*    BMP: Recent Labs    02/07/23 1428 02/08/23 0427 03/22/23 0809 06/27/23 0918  NA 132* 133* 137 140  K 3.9 3.2* 4.2 4.1  CL 100 104 106 110  CO2 22 21* 21* 22  GLUCOSE 107* 87 202* 144*  BUN 50* 51* 35* 37*  CALCIUM  8.3* 8.2* 9.0 9.1  CREATININE 2.33* 1.92* 1.34* 1.37*  GFRNONAA 27* 34* 52* 50*    LIVER FUNCTION TESTS: Recent Labs    12/27/22 0830 02/07/23 1428 03/22/23 0809 06/27/23 0918  BILITOT 2.7* 2.2* 1.2 2.0*  AST 22 25 29 27   ALT 23 48* 43 42  ALKPHOS 86 87 113  104  PROT 6.9 5.8* 7.2 6.4*  ALBUMIN  3.8 2.8* 3.6 3.8    TUMOR MARKERS: CEA on 1/2 of 66.6, up from 46.1 on 11/27 and 11.6 on 8/22  Assessment and Plan:  I spoke with Mr. Bathe and his wife by phone. Based on the recent imaging, there is evidence of residual/recurrent carcinoma along the posterior margin of the previously ablated inferior right lobe metastatic lesion. No other metastatic disease is identified in the liver or elsewhere in the body.  The residual disease is amenable to repeat thermal ablation based on location and size. Mr. Lottman is interested in pursuing repeat ablation and we will begin the scheduling process at [facility]. The procedure will again be performed under general anesthesia and likely involve overnight observation given his age and the fact that they live in [place], [place].   Electronically Signed: Marcey ONEIDA Moan 08/09/2023, 9:36 AM    I spent a total of  15 Minutes in remote  clinical consultation, greater than 50% of which was counseling/coordinating care for metastatic colorectal carcinoma to the liver.    Visit type: Audio only (telephone). Audio (no video) only due to patient's lack of internet/smartphone capability. Alternative for in-person consultation at [facility], [address] [place], [place], [place]. This visit type was conducted due to national recommendations for restrictions regarding the COVID-19 Pandemic (e.g. social distancing).  This format is felt to be most appropriate for this patient at this time.  All issues noted in this document were discussed and addressed.

## 2023-08-13 ENCOUNTER — Encounter (HOSPITAL_COMMUNITY): Payer: Self-pay

## 2023-08-13 ENCOUNTER — Other Ambulatory Visit (HOSPITAL_COMMUNITY): Payer: Self-pay | Admitting: Interventional Radiology

## 2023-08-13 DIAGNOSIS — C189 Malignant neoplasm of colon, unspecified: Secondary | ICD-10-CM

## 2023-08-13 NOTE — Progress Notes (Signed)
 RE: Herbert Wood Received: Today [name], MD  Sylvie Farrier to hold Eliquis 2 days prior.

## 2023-09-03 ENCOUNTER — Other Ambulatory Visit: Payer: Self-pay | Admitting: Radiology

## 2023-09-03 DIAGNOSIS — Z85038 Personal history of other malignant neoplasm of large intestine: Secondary | ICD-10-CM

## 2023-09-03 DIAGNOSIS — C787 Secondary malignant neoplasm of liver and intrahepatic bile duct: Secondary | ICD-10-CM

## 2023-09-03 NOTE — Progress Notes (Signed)
Surgery orders requested with radiology. 

## 2023-09-06 NOTE — Patient Instructions (Signed)
 DUE TO COVID-19 ONLY TWO VISITORS  (aged 87 and older)  ARE ALLOWED TO COME WITH YOU AND STAY IN THE WAITING ROOM ONLY DURING PRE OP AND PROCEDURE.   **NO VISITORS ARE ALLOWED IN THE SHORT STAY AREA OR RECOVERY ROOM!!**  IF YOU WILL BE ADMITTED INTO THE HOSPITAL YOU ARE ALLOWED ONLY FOUR SUPPORT PEOPLE DURING VISITATION HOURS ONLY (7 AM -8PM)   The support person(s) must pass our screening, gel in and out, and wear a mask at all times, including in the patient's room. Patients must also wear a mask when staff or their support person are in the room. Visitors GUEST BADGE MUST BE WORN VISIBLY  One adult visitor may remain with you overnight and MUST be in the room by 8 P.M.     Your procedure is scheduled on: 09/19/23   Report to [facility] Main Entrance    Report to admitting at: 6:15 AM   Call this number if you have problems the morning of surgery [phone]   Do not eat food or drink: After Midnight.  FOLLOW BOWEL PREP AND ANY ADDITIONAL PRE OP INSTRUCTIONS YOU RECEIVED FROM YOUR SURGEON'S OFFICE!!!   Oral Hygiene is also important to reduce your risk of infection.                                    Remember - BRUSH YOUR TEETH THE MORNING OF SURGERY WITH YOUR REGULAR TOOTHPASTE  DENTURES WILL BE REMOVED PRIOR TO SURGERY PLEASE DO NOT APPLY Poly grip OR ADHESIVES!!!   Do NOT smoke after Midnight   Take these medicines the morning of surgery with A SIP OF WATER: hydralazine ,loratadine ,carvedilol ,amlodipine ,prednisone ,kerendia ,omeprazole .Use eye drops as usual.  DO NOT TAKE ANY ORAL DIABETIC MEDICATIONS DAY OF YOUR SURGERY  Bring CPAP mask and tubing day of surgery.                              You may not have any metal on your body including hair pins, jewelry, and body piercing             Do not wear lotions, powders, perfumes/cologne, or deodorant              Men may shave face and neck.   Do not bring valuables to the hospital. [facility] IS NOT              RESPONSIBLE   FOR VALUABLES.   Contacts, glasses, or bridgework may not be worn into surgery.   Bring small overnight bag day of surgery.   DO NOT BRING YOUR HOME MEDICATIONS TO THE HOSPITAL. PHARMACY WILL DISPENSE MEDICATIONS LISTED ON YOUR MEDICATION LIST TO YOU DURING YOUR ADMISSION IN THE HOSPITAL!    Patients discharged on the day of surgery will not be allowed to drive home.  Someone NEEDS to stay with you for the first 24 hours after anesthesia.   Special Instructions: Bring a copy of your healthcare power of attorney and living will documents         the day of surgery if you haven't scanned them before.              Please read over the following fact sheets you were given: IF YOU HAVE QUESTIONS ABOUT YOUR PRE-OP INSTRUCTIONS PLEASE CALL [phone]    [facility] - Preparing for Surgery Before surgery,  you can play an important role.  Because skin is not sterile, your skin needs to be as free of germs as possible.  You can reduce the number of germs on your skin by washing with CHG (chlorahexidine gluconate) soap before surgery.  CHG is an antiseptic cleaner which kills germs and bonds with the skin to continue killing germs even after washing. Please DO NOT use if you have an allergy to CHG or antibacterial soaps.  If your skin becomes reddened/irritated stop using the CHG and inform your nurse when you arrive at Short Stay. Do not shave (including legs and underarms) for at least 48 hours prior to the first CHG shower.  You may shave your face/neck. Please follow these instructions carefully:  1.  Shower with CHG Soap the night before surgery and the  morning of Surgery.  2.  If you choose to wash your hair, wash your hair first as usual with your  normal  shampoo.  3.  After you shampoo, rinse your hair and body thoroughly to remove the  shampoo.                           4.  Use CHG as you would any other liquid soap.  You can apply chg directly  to the skin and wash                        Gently with a scrungie or clean washcloth.  5.  Apply the CHG Soap to your body ONLY FROM THE NECK DOWN.   Do not use on face/ open                           Wound or open sores. Avoid contact with eyes, ears mouth and genitals (private parts).                       Wash face,  Genitals (private parts) with your normal soap.             6.  Wash thoroughly, paying special attention to the area where your surgery  will be performed.  7.  Thoroughly rinse your body with warm water from the neck down.  8.  DO NOT shower/wash with your normal soap after using and rinsing off  the CHG Soap.                9.  Pat yourself dry with a clean towel.            10.  Wear clean pajamas.            11.  Place clean sheets on your bed the night of your first shower and do not  sleep with pets. Day of Surgery : Do not apply any lotions/deodorants the morning of surgery.  Please wear clean clothes to the hospital/surgery center.  FAILURE TO FOLLOW THESE INSTRUCTIONS MAY RESULT IN THE CANCELLATION OF YOUR SURGERY PATIENT SIGNATURE_________________________________  NURSE SIGNATURE__________________________________  ________________________________________________________________________ WHAT IS A BLOOD TRANSFUSION? Blood Transfusion Information  A transfusion is the replacement of blood or some of its parts. Blood is made up of multiple cells which provide different functions. Red blood cells carry oxygen and are used for blood loss replacement. White blood cells fight against infection. Platelets control bleeding. Plasma helps clot blood. Other blood products are available for specialized needs, such  as hemophilia or other clotting disorders. BEFORE THE TRANSFUSION  Who gives blood for transfusions?  Healthy volunteers who are fully evaluated to make sure their blood is safe. This is blood bank blood. Transfusion therapy is the safest it has ever been in the practice of medicine. Before  blood is taken from a donor, a complete history is taken to make sure that person has no history of diseases nor engages in risky social behavior (examples are intravenous drug use or sexual activity with multiple partners). The donor's travel history is screened to minimize risk of transmitting infections, such as malaria. The donated blood is tested for signs of infectious diseases, such as HIV and hepatitis. The blood is then tested to be sure it is compatible with you in order to minimize the chance of a transfusion reaction. If you or a relative donates blood, this is often done in anticipation of surgery and is not appropriate for emergency situations. It takes many days to process the donated blood. RISKS AND COMPLICATIONS Although transfusion therapy is very safe and saves many lives, the main dangers of transfusion include:  Getting an infectious disease. Developing a transfusion reaction. This is an allergic reaction to something in the blood you were given. Every precaution is taken to prevent this. The decision to have a blood transfusion has been considered carefully by your caregiver before blood is given. Blood is not given unless the benefits outweigh the risks. AFTER THE TRANSFUSION Right after receiving a blood transfusion, you will usually feel much better and more energetic. This is especially true if your red blood cells have gotten low (anemic). The transfusion raises the level of the red blood cells which carry oxygen, and this usually causes an energy increase. The nurse administering the transfusion will monitor you carefully for complications. HOME CARE INSTRUCTIONS  No special instructions are needed after a transfusion. You may find your energy is better. Speak with your caregiver about any limitations on activity for underlying diseases you may have. SEEK MEDICAL CARE IF:  Your condition is not improving after your transfusion. You develop redness or irritation at the  intravenous (IV) site. SEEK IMMEDIATE MEDICAL CARE IF:  Any of the following symptoms occur over the next 12 hours: Shaking chills. You have a temperature by mouth above 102 F (38.9 C), not controlled by medicine. Chest, back, or muscle pain. People around you feel you are not acting correctly or are confused. Shortness of breath or difficulty breathing. Dizziness and fainting. You get a rash or develop hives. You have a decrease in urine output. Your urine turns a dark color or changes to pink, red, or brown. Any of the following symptoms occur over the next 10 days: You have a temperature by mouth above 102 F (38.9 C), not controlled by medicine. Shortness of breath. Weakness after normal activity. The white part of the eye turns yellow (jaundice). You have a decrease in the amount of urine or are urinating less often. Your urine turns a dark color or changes to pink, red, or brown. Document Released: 07/14/2000 Document Revised: 10/09/2011 Document Reviewed: 03/02/2008 [facility] Patient Information 2014 [place], [place].  _______________________________________________________________________

## 2023-09-07 ENCOUNTER — Other Ambulatory Visit: Payer: Self-pay

## 2023-09-07 ENCOUNTER — Encounter (HOSPITAL_COMMUNITY): Payer: Self-pay

## 2023-09-07 ENCOUNTER — Encounter (HOSPITAL_COMMUNITY)
Admission: RE | Admit: 2023-09-07 | Discharge: 2023-09-07 | Disposition: A | Discharge status: Home / Self Care | Payer: Medicare Other | Source: Ambulatory Visit | Attending: Interventional Radiology | Admitting: Interventional Radiology

## 2023-09-07 VITALS — BP 111/68 | HR 58 | Temp 97.6°F | Ht 70.0 in | Wt 197.0 lb

## 2023-09-07 DIAGNOSIS — Z01812 Encounter for preprocedural laboratory examination: Secondary | ICD-10-CM | POA: Diagnosis present

## 2023-09-07 DIAGNOSIS — N1831 Chronic kidney disease, stage 3a: Secondary | ICD-10-CM | POA: Insufficient documentation

## 2023-09-07 DIAGNOSIS — C189 Malignant neoplasm of colon, unspecified: Secondary | ICD-10-CM | POA: Diagnosis not present

## 2023-09-07 DIAGNOSIS — E1122 Type 2 diabetes mellitus with diabetic chronic kidney disease: Secondary | ICD-10-CM | POA: Insufficient documentation

## 2023-09-07 DIAGNOSIS — C787 Secondary malignant neoplasm of liver and intrahepatic bile duct: Secondary | ICD-10-CM | POA: Insufficient documentation

## 2023-09-07 LAB — COMPREHENSIVE METABOLIC PANEL
ALT: 89 U/L — ABNORMAL HIGH (ref 0–44)
AST: 69 U/L — ABNORMAL HIGH (ref 15–41)
Albumin: 3.6 g/dL (ref 3.5–5.0)
Alkaline Phosphatase: 125 U/L (ref 38–126)
Anion gap: 10 (ref 5–15)
BUN: 25 mg/dL — ABNORMAL HIGH (ref 8–23)
CO2: 22 mmol/L (ref 22–32)
Calcium: 8.7 mg/dL — ABNORMAL LOW (ref 8.9–10.3)
Chloride: 107 mmol/L (ref 98–111)
Creatinine, Ser: 1.19 mg/dL (ref 0.61–1.24)
GFR, Estimated: 59 mL/min — ABNORMAL LOW (ref >60)
Glucose, Bld: 102 mg/dL — ABNORMAL HIGH (ref 70–99)
Potassium: 4 mmol/L (ref 3.5–5.1)
Sodium: 139 mmol/L (ref 135–145)
Total Bilirubin: 1.4 mg/dL — ABNORMAL HIGH (ref 0.0–1.2)
Total Protein: 6.7 g/dL (ref 6.5–8.1)

## 2023-09-07 LAB — TYPE AND SCREEN
ABO/RH(D): O POS
Antibody Screen: NEGATIVE

## 2023-09-07 LAB — CBC
HCT: 36.4 % — ABNORMAL LOW (ref 39.0–52.0)
Hemoglobin: 11.9 g/dL — ABNORMAL LOW (ref 13.0–17.0)
MCH: 32.7 pg (ref 26.0–34.0)
MCHC: 32.7 g/dL (ref 30.0–36.0)
MCV: 100 fL (ref 80.0–100.0)
Platelets: 176 10*3/uL (ref 150–400)
RBC: 3.64 MIL/uL — ABNORMAL LOW (ref 4.22–5.81)
RDW: 12.9 % (ref 11.5–15.5)
WBC: 8.9 10*3/uL (ref 4.0–10.5)
nRBC: 0 % (ref 0.0–0.2)

## 2023-09-07 LAB — HEMOGLOBIN A1C
Hgb A1c MFr Bld: 6.1 % — ABNORMAL HIGH (ref 4.8–5.6)
Mean Plasma Glucose: 128.37 mg/dL

## 2023-09-07 LAB — APTT: aPTT: 32 s (ref 24–36)

## 2023-09-07 LAB — PROTIME-INR
INR: 1.2 (ref 0.8–1.2)
Prothrombin Time: 15.8 s — ABNORMAL HIGH (ref 11.4–15.2)

## 2023-09-07 LAB — GLUCOSE, CAPILLARY: Glucose-Capillary: 116 mg/dL — ABNORMAL HIGH (ref 70–99)

## 2023-09-07 NOTE — Progress Notes (Signed)
 For Anesthesia: PCP - Meade Bigness, MD  Cardiologist - Cleotilde Kuba, MD   Bowel Prep reminder:N/A  Chest x-ray -  EKG - 02/07/23: EPIC Stress Test - 01/26/22 ECHO - 01/30/22 Cardiac Cath -  Pacemaker/ICD device last checked: Pacemaker orders received: Device Rep notified:  Spinal Cord Stimulator:N/A  Sleep Study - Yes CPAP - NO  Fasting Blood Sugar - N/A  Checks Blood Sugar _____ times a day Date and result of last Hgb A1c-5.9: 12/14/22  Last dose of GLP1 agonist- N/A GLP1 instructions:   Last dose of SGLT-2 inhibitors- N/A SGLT-2 instructions:   Blood Thinner Instructions: Eliquis  will be hold after: 09/15/23 Aspirin Instructions: Last Dose:  Activity level: Can go up a flight of stairs and activities of daily living without stopping and without chest pain and/or shortness of breath   Able to exercise without chest pain and/or shortness of breath    Anesthesia review: DM II, HTN, DVT, factor V deficiency, stage III sigmoid colon cancer   Patient denies shortness of breath, fever, cough and chest pain at PAT appointment   Patient verbalized understanding of instructions that were given to them at the PAT appointment. Patient was also instructed that they will need to review over the PAT instructions again at home before surgery.

## 2023-09-16 ENCOUNTER — Emergency Department (HOSPITAL_COMMUNITY): Payer: Medicare Other

## 2023-09-16 ENCOUNTER — Inpatient Hospital Stay (HOSPITAL_COMMUNITY)
Admission: EM | Admit: 2023-09-16 | Discharge: 2023-09-21 | DRG: 872 | Disposition: A | Discharge status: Home / Self Care | Payer: Medicare Other | Attending: Internal Medicine | Admitting: Internal Medicine

## 2023-09-16 ENCOUNTER — Other Ambulatory Visit: Payer: Self-pay

## 2023-09-16 ENCOUNTER — Encounter (HOSPITAL_COMMUNITY): Payer: Self-pay | Admitting: *Deleted

## 2023-09-16 DIAGNOSIS — E1122 Type 2 diabetes mellitus with diabetic chronic kidney disease: Secondary | ICD-10-CM | POA: Diagnosis present

## 2023-09-16 DIAGNOSIS — K219 Gastro-esophageal reflux disease without esophagitis: Secondary | ICD-10-CM | POA: Diagnosis present

## 2023-09-16 DIAGNOSIS — R6520 Severe sepsis without septic shock: Secondary | ICD-10-CM | POA: Diagnosis present

## 2023-09-16 DIAGNOSIS — D6959 Other secondary thrombocytopenia: Secondary | ICD-10-CM | POA: Diagnosis present

## 2023-09-16 DIAGNOSIS — I10 Essential (primary) hypertension: Secondary | ICD-10-CM | POA: Diagnosis not present

## 2023-09-16 DIAGNOSIS — Z7984 Long term (current) use of oral hypoglycemic drugs: Secondary | ICD-10-CM

## 2023-09-16 DIAGNOSIS — N179 Acute kidney failure, unspecified: Secondary | ICD-10-CM | POA: Diagnosis present

## 2023-09-16 DIAGNOSIS — N183 Chronic kidney disease, stage 3 unspecified: Secondary | ICD-10-CM | POA: Diagnosis present

## 2023-09-16 DIAGNOSIS — Z86718 Personal history of other venous thrombosis and embolism: Secondary | ICD-10-CM

## 2023-09-16 DIAGNOSIS — D630 Anemia in neoplastic disease: Secondary | ICD-10-CM | POA: Diagnosis present

## 2023-09-16 DIAGNOSIS — B961 Klebsiella pneumoniae [K. pneumoniae] as the cause of diseases classified elsewhere: Secondary | ICD-10-CM | POA: Diagnosis present

## 2023-09-16 DIAGNOSIS — K8309 Other cholangitis: Secondary | ICD-10-CM | POA: Diagnosis not present

## 2023-09-16 DIAGNOSIS — A4159 Other Gram-negative sepsis: Secondary | ICD-10-CM | POA: Diagnosis present

## 2023-09-16 DIAGNOSIS — K8063 Calculus of gallbladder and bile duct with acute cholecystitis with obstruction: Secondary | ICD-10-CM | POA: Diagnosis present

## 2023-09-16 DIAGNOSIS — K828 Other specified diseases of gallbladder: Secondary | ICD-10-CM | POA: Diagnosis not present

## 2023-09-16 DIAGNOSIS — Z9049 Acquired absence of other specified parts of digestive tract: Secondary | ICD-10-CM

## 2023-09-16 DIAGNOSIS — K8030 Calculus of bile duct with cholangitis, unspecified, without obstruction: Secondary | ICD-10-CM | POA: Diagnosis not present

## 2023-09-16 DIAGNOSIS — C7800 Secondary malignant neoplasm of unspecified lung: Secondary | ICD-10-CM | POA: Diagnosis present

## 2023-09-16 DIAGNOSIS — Z85828 Personal history of other malignant neoplasm of skin: Secondary | ICD-10-CM

## 2023-09-16 DIAGNOSIS — Z860101 Personal history of adenomatous and serrated colon polyps: Secondary | ICD-10-CM

## 2023-09-16 DIAGNOSIS — Z87891 Personal history of nicotine dependence: Secondary | ICD-10-CM

## 2023-09-16 DIAGNOSIS — G8929 Other chronic pain: Secondary | ICD-10-CM | POA: Diagnosis present

## 2023-09-16 DIAGNOSIS — D649 Anemia, unspecified: Secondary | ICD-10-CM | POA: Diagnosis not present

## 2023-09-16 DIAGNOSIS — N1831 Chronic kidney disease, stage 3a: Secondary | ICD-10-CM | POA: Diagnosis present

## 2023-09-16 DIAGNOSIS — Z79899 Other long term (current) drug therapy: Secondary | ICD-10-CM

## 2023-09-16 DIAGNOSIS — I700 Atherosclerosis of aorta: Secondary | ICD-10-CM | POA: Diagnosis present

## 2023-09-16 DIAGNOSIS — R0902 Hypoxemia: Secondary | ICD-10-CM | POA: Diagnosis present

## 2023-09-16 DIAGNOSIS — D6851 Activated protein C resistance: Secondary | ICD-10-CM | POA: Diagnosis present

## 2023-09-16 DIAGNOSIS — M549 Dorsalgia, unspecified: Secondary | ICD-10-CM | POA: Diagnosis present

## 2023-09-16 DIAGNOSIS — E785 Hyperlipidemia, unspecified: Secondary | ICD-10-CM | POA: Diagnosis present

## 2023-09-16 DIAGNOSIS — Z7901 Long term (current) use of anticoagulants: Secondary | ICD-10-CM

## 2023-09-16 DIAGNOSIS — K8050 Calculus of bile duct without cholangitis or cholecystitis without obstruction: Secondary | ICD-10-CM | POA: Diagnosis present

## 2023-09-16 DIAGNOSIS — I129 Hypertensive chronic kidney disease with stage 1 through stage 4 chronic kidney disease, or unspecified chronic kidney disease: Secondary | ICD-10-CM | POA: Diagnosis present

## 2023-09-16 DIAGNOSIS — C787 Secondary malignant neoplasm of liver and intrahepatic bile duct: Secondary | ICD-10-CM | POA: Diagnosis present

## 2023-09-16 DIAGNOSIS — R7989 Other specified abnormal findings of blood chemistry: Secondary | ICD-10-CM | POA: Diagnosis not present

## 2023-09-16 DIAGNOSIS — R509 Fever, unspecified: Secondary | ICD-10-CM | POA: Diagnosis present

## 2023-09-16 DIAGNOSIS — Z1152 Encounter for screening for COVID-19: Secondary | ICD-10-CM | POA: Diagnosis not present

## 2023-09-16 DIAGNOSIS — K432 Incisional hernia without obstruction or gangrene: Secondary | ICD-10-CM | POA: Diagnosis present

## 2023-09-16 DIAGNOSIS — N189 Chronic kidney disease, unspecified: Secondary | ICD-10-CM | POA: Diagnosis present

## 2023-09-16 DIAGNOSIS — R0789 Other chest pain: Principal | ICD-10-CM | POA: Diagnosis present

## 2023-09-16 DIAGNOSIS — E1151 Type 2 diabetes mellitus with diabetic peripheral angiopathy without gangrene: Secondary | ICD-10-CM | POA: Diagnosis not present

## 2023-09-16 DIAGNOSIS — D7589 Other specified diseases of blood and blood-forming organs: Secondary | ICD-10-CM | POA: Diagnosis present

## 2023-09-16 DIAGNOSIS — Z85038 Personal history of other malignant neoplasm of large intestine: Secondary | ICD-10-CM | POA: Diagnosis present

## 2023-09-16 DIAGNOSIS — R7881 Bacteremia: Secondary | ICD-10-CM | POA: Diagnosis present

## 2023-09-16 DIAGNOSIS — K838 Other specified diseases of biliary tract: Secondary | ICD-10-CM | POA: Diagnosis not present

## 2023-09-16 DIAGNOSIS — K315 Obstruction of duodenum: Secondary | ICD-10-CM | POA: Diagnosis not present

## 2023-09-16 DIAGNOSIS — K8020 Calculus of gallbladder without cholecystitis without obstruction: Secondary | ICD-10-CM | POA: Diagnosis not present

## 2023-09-16 DIAGNOSIS — H9193 Unspecified hearing loss, bilateral: Secondary | ICD-10-CM | POA: Diagnosis present

## 2023-09-16 DIAGNOSIS — Z888 Allergy status to other drugs, medicaments and biological substances status: Secondary | ICD-10-CM

## 2023-09-16 DIAGNOSIS — I959 Hypotension, unspecified: Secondary | ICD-10-CM | POA: Diagnosis present

## 2023-09-16 DIAGNOSIS — A419 Sepsis, unspecified organism: Secondary | ICD-10-CM | POA: Diagnosis present

## 2023-09-16 LAB — URINALYSIS, W/ REFLEX TO CULTURE (INFECTION SUSPECTED)
Bacteria, UA: NONE SEEN
Bilirubin Urine: NEGATIVE
Glucose, UA: NEGATIVE mg/dL
Hgb urine dipstick: NEGATIVE
Ketones, ur: NEGATIVE mg/dL
Leukocytes,Ua: NEGATIVE
Nitrite: NEGATIVE
Protein, ur: 100 mg/dL — AB
Specific Gravity, Urine: >1.046 — ABNORMAL HIGH (ref 1.005–1.030)
pH: 5 (ref 5.0–8.0)

## 2023-09-16 LAB — CBC
HCT: 35 % — ABNORMAL LOW (ref 39.0–52.0)
Hemoglobin: 11.8 g/dL — ABNORMAL LOW (ref 13.0–17.0)
MCH: 33.8 pg (ref 26.0–34.0)
MCHC: 33.7 g/dL (ref 30.0–36.0)
MCV: 100.3 fL — ABNORMAL HIGH (ref 80.0–100.0)
Platelets: 174 10*3/uL (ref 150–400)
RBC: 3.49 MIL/uL — ABNORMAL LOW (ref 4.22–5.81)
RDW: 13.2 % (ref 11.5–15.5)
WBC: 8.2 10*3/uL (ref 4.0–10.5)
nRBC: 0 % (ref 0.0–0.2)

## 2023-09-16 LAB — BASIC METABOLIC PANEL
Anion gap: 11 (ref 5–15)
BUN: 26 mg/dL — ABNORMAL HIGH (ref 8–23)
CO2: 22 mmol/L (ref 22–32)
Calcium: 8.9 mg/dL (ref 8.9–10.3)
Chloride: 107 mmol/L (ref 98–111)
Creatinine, Ser: 1.38 mg/dL — ABNORMAL HIGH (ref 0.61–1.24)
GFR, Estimated: 50 mL/min — ABNORMAL LOW (ref >60)
Glucose, Bld: 145 mg/dL — ABNORMAL HIGH (ref 70–99)
Potassium: 4 mmol/L (ref 3.5–5.1)
Sodium: 140 mmol/L (ref 135–145)

## 2023-09-16 LAB — HEPATIC FUNCTION PANEL
ALT: 167 U/L — ABNORMAL HIGH (ref 0–44)
AST: 213 U/L — ABNORMAL HIGH (ref 15–41)
Albumin: 3.6 g/dL (ref 3.5–5.0)
Alkaline Phosphatase: 198 U/L — ABNORMAL HIGH (ref 38–126)
Bilirubin, Direct: 1 mg/dL — ABNORMAL HIGH (ref 0.0–0.2)
Indirect Bilirubin: 1.4 mg/dL — ABNORMAL HIGH (ref 0.3–0.9)
Total Bilirubin: 2.4 mg/dL — ABNORMAL HIGH (ref 0.0–1.2)
Total Protein: 6.7 g/dL (ref 6.5–8.1)

## 2023-09-16 LAB — TROPONIN I (HIGH SENSITIVITY)
Troponin I (High Sensitivity): 10 ng/L (ref <18)
Troponin I (High Sensitivity): 13 ng/L (ref <18)

## 2023-09-16 LAB — RESP PANEL BY RT-PCR (RSV, FLU A&B, COVID)  RVPGX2
Influenza A by PCR: NEGATIVE
Influenza B by PCR: NEGATIVE
Resp Syncytial Virus by PCR: NEGATIVE
SARS Coronavirus 2 by RT PCR: NEGATIVE

## 2023-09-16 MED ORDER — PANTOPRAZOLE SODIUM 40 MG PO TBEC
40.0000 mg | DELAYED_RELEASE_TABLET | Freq: Every day | ORAL | Status: DC
  Administered 2023-09-16 – 2023-09-18 (×3): 40 mg via ORAL
  Filled 2023-09-16 (×3): qty 1

## 2023-09-16 MED ORDER — VITAMIN B-12 1000 MCG PO TABS
1000.0000 ug | ORAL_TABLET | Freq: Every evening | ORAL | Status: DC
Start: 2023-09-16 — End: 2023-09-21
  Administered 2023-09-16 – 2023-09-20 (×5): 1000 ug via ORAL
  Filled 2023-09-16 (×5): qty 1

## 2023-09-16 MED ORDER — SODIUM CHLORIDE 0.9 % IV SOLN
2.0000 g | Freq: Two times a day (BID) | INTRAVENOUS | Status: DC
  Administered 2023-09-17 – 2023-09-18 (×4): 2 g via INTRAVENOUS
  Filled 2023-09-16 (×4): qty 12.5

## 2023-09-16 MED ORDER — TRAZODONE HCL 50 MG PO TABS: 50.0000 mg | ORAL_TABLET | Freq: Every evening | ORAL | Status: DC | PRN

## 2023-09-16 MED ORDER — PANTOPRAZOLE SODIUM 40 MG IV SOLR
40.0000 mg | Freq: Once | INTRAVENOUS | Status: AC
  Administered 2023-09-16: 40 mg via INTRAVENOUS
  Filled 2023-09-16: qty 10

## 2023-09-16 MED ORDER — METRONIDAZOLE 500 MG/100ML IV SOLN
500.0000 mg | Freq: Two times a day (BID) | INTRAVENOUS | Status: DC
  Administered 2023-09-17 – 2023-09-18 (×4): 500 mg via INTRAVENOUS
  Filled 2023-09-16 (×3): qty 100

## 2023-09-16 MED ORDER — SODIUM CHLORIDE 0.9 % IV SOLN: 2.0000 g | Freq: Once | INTRAVENOUS | Status: DC

## 2023-09-16 MED ORDER — FOLIC ACID 1 MG PO TABS
1.0000 mg | ORAL_TABLET | Freq: Every evening | ORAL | Status: DC
  Administered 2023-09-16 – 2023-09-20 (×5): 1 mg via ORAL
  Filled 2023-09-16 (×5): qty 1

## 2023-09-16 MED ORDER — VANCOMYCIN HCL IN DEXTROSE 1-5 GM/200ML-% IV SOLN
1000.0000 mg | Freq: Once | INTRAVENOUS | Status: AC
  Administered 2023-09-17: 1000 mg via INTRAVENOUS
  Filled 2023-09-16: qty 200

## 2023-09-16 MED ORDER — APIXABAN 5 MG PO TABS
5.0000 mg | ORAL_TABLET | Freq: Two times a day (BID) | ORAL | Status: DC
  Administered 2023-09-16 – 2023-09-18 (×4): 5 mg via ORAL
  Filled 2023-09-16 (×4): qty 1

## 2023-09-16 MED ORDER — FOLIC ACID 800 MCG PO TABS: 800.0000 ug | ORAL_TABLET | Freq: Every evening | ORAL | Status: DC

## 2023-09-16 MED ORDER — HYDRALAZINE HCL 50 MG PO TABS
50.0000 mg | ORAL_TABLET | Freq: Two times a day (BID) | ORAL | Status: DC
  Administered 2023-09-17 – 2023-09-21 (×9): 50 mg via ORAL
  Filled 2023-09-16: qty 2
  Filled 2023-09-16 (×3): qty 1
  Filled 2023-09-16 (×3): qty 2
  Filled 2023-09-16 (×2): qty 1
  Filled 2023-09-16: qty 2

## 2023-09-16 MED ORDER — ONDANSETRON HCL 4 MG PO TABS: 4.0000 mg | ORAL_TABLET | Freq: Four times a day (QID) | ORAL | Status: DC | PRN

## 2023-09-16 MED ORDER — VANCOMYCIN HCL 1250 MG/250ML IV SOLN
1250.0000 mg | INTRAVENOUS | Status: DC
  Administered 2023-09-17: 1250 mg via INTRAVENOUS
  Filled 2023-09-16: qty 250

## 2023-09-16 MED ORDER — AMLODIPINE BESYLATE 5 MG PO TABS
5.0000 mg | ORAL_TABLET | Freq: Two times a day (BID) | ORAL | Status: DC
  Administered 2023-09-17 – 2023-09-21 (×9): 5 mg via ORAL
  Filled 2023-09-16 (×10): qty 1

## 2023-09-16 MED ORDER — ACETAMINOPHEN 500 MG PO TABS
1000.0000 mg | ORAL_TABLET | Freq: Once | ORAL | Status: AC
  Administered 2023-09-16: 1000 mg via ORAL
  Filled 2023-09-16: qty 2

## 2023-09-16 MED ORDER — DORZOLAMIDE HCL 2 % OP SOLN
1.0000 [drp] | Freq: Two times a day (BID) | OPHTHALMIC | Status: DC
  Administered 2023-09-17 – 2023-09-21 (×9): 1 [drp] via OPHTHALMIC
  Filled 2023-09-16 (×4): qty 10

## 2023-09-16 MED ORDER — LACTATED RINGERS IV SOLN
150.0000 mL/h | INTRAVENOUS | Status: DC
  Administered 2023-09-17: 150 mL/h via INTRAVENOUS

## 2023-09-16 MED ORDER — VITAMIN C 500 MG PO TABS
1000.0000 mg | ORAL_TABLET | Freq: Every evening | ORAL | Status: DC
  Administered 2023-09-16 – 2023-09-20 (×5): 1000 mg via ORAL
  Filled 2023-09-16 (×5): qty 2

## 2023-09-16 MED ORDER — CARVEDILOL 6.25 MG PO TABS
6.2500 mg | ORAL_TABLET | Freq: Two times a day (BID) | ORAL | Status: DC
  Administered 2023-09-17 – 2023-09-21 (×6): 6.25 mg via ORAL
  Filled 2023-09-16 (×8): qty 1

## 2023-09-16 MED ORDER — FINERENONE 10 MG PO TABS
10.0000 mg | ORAL_TABLET | Freq: Every day | ORAL | Status: DC
  Administered 2023-09-18 – 2023-09-21 (×4): 10 mg via ORAL
  Filled 2023-09-16 (×4): qty 1

## 2023-09-16 MED ORDER — ACETAMINOPHEN 650 MG RE SUPP: 650.0000 mg | Freq: Four times a day (QID) | RECTAL | Status: DC | PRN

## 2023-09-16 MED ORDER — SODIUM CHLORIDE 0.9 % IV SOLN
1.0000 g | Freq: Once | INTRAVENOUS | Status: AC
  Administered 2023-09-16: 1 g via INTRAVENOUS
  Filled 2023-09-16: qty 10

## 2023-09-16 MED ORDER — IOHEXOL 350 MG/ML SOLN
100.0000 mL | Freq: Once | INTRAVENOUS | Status: AC | PRN
  Administered 2023-09-16: 100 mL via INTRAVENOUS

## 2023-09-16 MED ORDER — LORATADINE 10 MG PO TABS
10.0000 mg | ORAL_TABLET | Freq: Every morning | ORAL | Status: DC
  Administered 2023-09-17 – 2023-09-21 (×5): 10 mg via ORAL
  Filled 2023-09-16 (×5): qty 1

## 2023-09-16 MED ORDER — HYDROMORPHONE HCL 1 MG/ML IJ SOLN
0.5000 mg | Freq: Once | INTRAMUSCULAR | Status: AC
  Administered 2023-09-16: 0.5 mg via INTRAVENOUS
  Filled 2023-09-16: qty 0.5

## 2023-09-16 MED ORDER — LINAGLIPTIN 5 MG PO TABS
5.0000 mg | ORAL_TABLET | Freq: Every morning | ORAL | Status: DC
  Administered 2023-09-17 – 2023-09-21 (×4): 5 mg via ORAL
  Filled 2023-09-16 (×5): qty 1

## 2023-09-16 MED ORDER — FERROUS SULFATE 325 (65 FE) MG PO TABS
325.0000 mg | ORAL_TABLET | Freq: Every day | ORAL | Status: DC
  Administered 2023-09-17 – 2023-09-21 (×5): 325 mg via ORAL
  Filled 2023-09-16 (×5): qty 1

## 2023-09-16 MED ORDER — ACETAMINOPHEN 325 MG PO TABS: 650.0000 mg | ORAL_TABLET | Freq: Four times a day (QID) | ORAL | Status: DC | PRN

## 2023-09-16 MED ORDER — VITAMIN D 25 MCG (1000 UNIT) PO TABS
5000.0000 [IU] | ORAL_TABLET | Freq: Every evening | ORAL | Status: DC
Start: 2023-09-16 — End: 2023-09-21
  Administered 2023-09-16 – 2023-09-20 (×5): 5000 [IU] via ORAL
  Filled 2023-09-16 (×5): qty 5

## 2023-09-16 MED ORDER — METRONIDAZOLE 500 MG/100ML IV SOLN
500.0000 mg | Freq: Two times a day (BID) | INTRAVENOUS | Status: DC
  Filled 2023-09-16: qty 100

## 2023-09-16 MED ORDER — ONDANSETRON 4 MG PO TBDP: ORAL_TABLET | ORAL | 0 refills | Status: AC

## 2023-09-16 MED ORDER — ONDANSETRON HCL 4 MG/2ML IJ SOLN
4.0000 mg | Freq: Once | INTRAMUSCULAR | Status: AC
  Administered 2023-09-16: 4 mg via INTRAVENOUS
  Filled 2023-09-16: qty 2

## 2023-09-16 MED ORDER — LATANOPROST 0.005 % OP SOLN
1.0000 [drp] | Freq: Every day | OPHTHALMIC | Status: DC
  Administered 2023-09-17 – 2023-09-20 (×4): 1 [drp] via OPHTHALMIC
  Filled 2023-09-16: qty 2.5

## 2023-09-16 MED ORDER — MAGNESIUM HYDROXIDE 400 MG/5ML PO SUSP: 30.0000 mL | Freq: Every day | ORAL | Status: DC | PRN

## 2023-09-16 MED ORDER — ONDANSETRON HCL 4 MG/2ML IJ SOLN
4.0000 mg | Freq: Four times a day (QID) | INTRAMUSCULAR | Status: DC | PRN
  Administered 2023-09-18: 4 mg via INTRAVENOUS
  Filled 2023-09-16: qty 2

## 2023-09-16 MED ORDER — ATORVASTATIN CALCIUM 40 MG PO TABS
40.0000 mg | ORAL_TABLET | Freq: Every evening | ORAL | Status: DC
  Administered 2023-09-16: 40 mg via ORAL
  Filled 2023-09-16: qty 1

## 2023-09-16 NOTE — Discharge Instructions (Signed)
Drink plenty of fluids.  Increase your omeprazole so you are taking it twice a day.  Follow-up with your family doctor or Dr. Ellin Saba this week for recheck

## 2023-09-16 NOTE — Progress Notes (Signed)
Pharmacy Antibiotic Note  Herbert Wood is a 89 y.o. male admitted on 09/16/2023 with fever and unknown source.   PMH significant for metastatic cancer. Pharmacy has been consulted for cefepime and vancomycin dosing.  Plan: Cefepime 2g q12h Vancomycin 1250mg  q24h (eAUC 512, Scr 1.38) F/u renal function, infectious work up and length of therapy Vancomycin levels as needed  Height: 5\' 10"  (177.8 cm) Weight: 90.7 kg (200 lb) IBW/kg (Calculated) : 73  Temp (24hrs), Avg:99 F (37.2 C), Min:97.7 F (36.5 C), Max:102.1 F (38.9 C)  Recent Labs  Lab 09/16/23 1220  WBC 8.2  CREATININE 1.38*    Estimated Creatinine Clearance: 43.5 mL/min (A) (by C-G formula based on SCr of 1.38 mg/dL (H)).    Allergies  Allergen Reactions   Gadobenate Nausea And Vomiting    Immediately upon the infusion of 15mL multihance contrast  Patient had exteme nausea and vomiting.   No other symptoms noted .  No injury.  MRI scan was completed after patient felt better.   Immediately upon the infusion of 15mL multihance contrast  Patient had exteme nausea and vomiting.   No other symptoms noted .  No injury.  MRI scan was completed after patient felt better.       Antimicrobials this admission: Cefepime 2/16 > Vancomycin 2/16 >  Microbiology results: 2/16 BCx: 2/16 MRSA PCR:   Thank you for allowing pharmacy to be a part of this patient's care.  Marja Kays 09/16/2023 10:10 PM

## 2023-09-16 NOTE — ED Triage Notes (Signed)
Pt with mid chest tightness this morning. + SOB  wife states pain radiated between shoulder blades. Pt took TUMS PTA and denies pain at present.

## 2023-09-16 NOTE — H&P (Incomplete)
[place]   PATIENT NAME: Herbert Wood    MR#:  [id number]  DATE OF BIRTH:  Jun 20, 1935  DATE OF ADMISSION:  09/16/2023  PRIMARY CARE PHYSICIAN: [name], MD   Patient is coming from: Home  REQUESTING/REFERRING PHYSICIAN: Loetta Rough, MD    CHIEF COMPLAINT:   Chief Complaint  Patient presents with  . Chest Pain    HISTORY OF PRESENT ILLNESS:  Herbert Wood is a 89 y.o. 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 Adenomatous colon polyp   . Anemia   . Carotid bruit   . Chronic dyspnea   . Diabetes (HCC)    type 2  . Diverticular disease   . DVT (deep venous thrombosis) (HCC)    lower limb  . Dyspnea   . Factor V deficiency (HCC)   . Hearing loss    bilateral  . Hyperlipemia   . Hypertension   . Inguinal hernia   . Kidney disease    stage 3  . Lung nodule   . Lymphadenopathy   . Malignant neoplasm (HCC)    salivary gland  . Murmur   . Osteoarthritis   . Senile purpura (HCC)     PAST SURGICAL HISTORY:   Past Surgical History:  Procedure Laterality Date  . BIOPSY  01/10/2022   Procedure: BIOPSY;  Surgeon: Dolores Frame, MD;  Location: [facility];  Service: Gastroenterology;;  . COLON SURGERY    . COLONOSCOPY WITH PROPOFOL N/A 01/10/2022   Procedure: COLONOSCOPY WITH PROPOFOL;  Surgeon: Dolores Frame, MD;  Location: [facility];  Service: Gastroenterology;  Laterality: N/A;  per Soledad Gerlach, pt to arrive at 9:15  . COLONOSCOPY WITH PROPOFOL N/A 02/27/2023   Procedure: COLONOSCOPY WITH PROPOFOL;  Surgeon: Dolores Frame, MD;  Location: [facility];  Service: Gastroenterology;  Laterality: N/A;  9:15am;asa 3  . ESOPHAGOGASTRODUODENOSCOPY (EGD) WITH PROPOFOL N/A 01/10/2022   Procedure:  ESOPHAGOGASTRODUODENOSCOPY (EGD) WITH PROPOFOL;  Surgeon: Dolores Frame, MD;  Location: [facility];  Service: Gastroenterology;  Laterality: N/A;  . HAND / FINGER LESION EXCISION    . HERNIA REPAIR    . IR RADIOLOGIST EVAL & MGMT  04/04/2023  . IR RADIOLOGIST EVAL & MGMT  08/09/2023  . MASTOIDECTOMY    . PILONIDAL CYST EXCISION    . POLYPECTOMY  01/10/2022   Procedure: POLYPECTOMY;  Surgeon: Dolores Frame, MD;  Location: [facility];  Service: Gastroenterology;;  . POLYPECTOMY  02/27/2023   Procedure: POLYPECTOMY;  Surgeon: Dolores Frame, MD;  Location: [facility];  Service: Gastroenterology;;  . RADIOLOGY WITH ANESTHESIA N/A 12/27/2022   Procedure: CT microwave ablation of the liver;  Surgeon: Irish Lack, MD;  Location: [facility];  Service: Radiology;  Laterality: N/A;  . SUBMUCOSAL TATTOO INJECTION  01/10/2022   Procedure: SUBMUCOSAL TATTOO INJECTION;  Surgeon: Dolores Frame, MD;  Location: [facility];  Service: Gastroenterology;;  . SURGERY OF LIP     skin cancer    SOCIAL HISTORY:   Social History   Tobacco Use  . Smoking status: Former    Current packs/day: 0.00    Average packs/day: 2.0 packs/day for 25.0 years (50.0 ttl pk-yrs)    Types: Cigarettes    Start date: 83  Quit date: 37    Years since quitting: 30.1    Passive exposure: Past  . Smokeless tobacco: Never  Substance Use Topics  . Alcohol use: Never    FAMILY HISTORY:  History reviewed. No pertinent family history.  DRUG ALLERGIES:   Allergies  Allergen Reactions  . Gadobenate Nausea And Vomiting    Immediately upon the infusion of 15mL multihance contrast  Patient had exteme nausea and vomiting.   No other symptoms noted .  No injury.  MRI scan was completed after patient felt better.   Immediately upon the infusion of 15mL multihance contrast  Patient had exteme nausea and vomiting.   No other symptoms noted .  No injury.  MRI scan was  completed after patient felt better.       REVIEW OF SYSTEMS:   ROS As per history of present illness. All pertinent systems were reviewed above. Constitutional, HEENT, cardiovascular, respiratory, GI, GU, musculoskeletal, neuro, psychiatric, endocrine, integumentary and hematologic systems were reviewed and are otherwise negative/unremarkable except for positive findings mentioned above in the HPI.   MEDICATIONS AT HOME:   Prior to Admission medications   Medication Sig Start Date End Date Taking? Authorizing Provider  ondansetron (ZOFRAN-ODT) 4 MG disintegrating tablet 4mg  ODT q4 hours prn nausea/vomit 09/16/23  Yes Bethann Berkshire, MD  albuterol (VENTOLIN HFA) 108 (90 Base) MCG/ACT inhaler Inhale into the lungs every 6 (six) hours as needed for wheezing or shortness of breath. Patient not taking: Reported on 09/04/2023    [provider]  amitriptyline (ELAVIL) 10 MG tablet Take 10 mg by mouth every evening.    [provider]  amLODipine (NORVASC) 5 MG tablet Take 5 mg by mouth 2 (two) times daily.    [provider]  ascorbic acid (VITAMIN C/NATURAL ROSE HIPS) 1000 MG tablet Take 1,000 mg by mouth every evening.    [provider]  atorvastatin (LIPITOR) 40 MG tablet Take 40 mg by mouth every evening.    [provider]  carvedilol (COREG) 6.25 MG tablet Take 6.25 mg by mouth 2 (two) times daily with a meal.    [provider]  Cholecalciferol (VITAMIN D3) 125 MCG (5000 UT) CAPS Take 5,000 Units by mouth every evening.    [provider]  dorzolamide (TRUSOPT) 2 % ophthalmic solution Place 1 drop into the left eye 2 (two) times daily.    [provider]  ELIQUIS 5 MG TABS tablet TAKE 1 TABLET(5 MG) BY MOUTH TWICE DAILY 06/05/23   [name], MD  ferrous sulfate 325 (65 FE) MG tablet Take 325 mg by mouth daily with breakfast.    [provider]  folic acid (FOLVITE) 800 MCG tablet Take 800 mcg by mouth  every evening. With B12 Now (Brand)    [provider]  hydrALAZINE (APRESOLINE) 25 MG tablet Take 50 mg by mouth in the morning and at bedtime.    [provider]  HYDROcodone-acetaminophen (NORCO) 5-325 MG tablet Take 1 tablet by mouth every 8 (eight) hours as needed for moderate pain (pain score 4-6). 07/18/23   [name], MD  KERENDIA 10 MG TABS Take 10 mg by mouth daily.    [provider]  latanoprost (XALATAN) 0.005 % ophthalmic solution Place 1 drop into both eyes at bedtime.    [provider]  linagliptin (TRADJENTA) 5 MG TABS tablet Take 5 mg by mouth in the morning.    [provider]  loratadine (CLARITIN) 10 MG tablet Take 10  mg by mouth every morning.    [provider]  Misc Natural Products (PROSTATE SUPPORT PO) Take 1 capsule by mouth every evening. NOW Prostate Support    [provider]  omeprazole (PRILOSEC) 40 MG capsule Take 1 capsule (40 mg total) by mouth daily. 05/21/23   Carlan, Jeral Pinch, NP  predniSONE (DELTASONE) 50 MG tablet Take 1 tablet 13 hours prior to scan, 7 hours prior to scan and 1 hour prior to scan for contrast allergy Take 50 mg Benadryl 1 hour prior to scan 06/27/23   [name], MD  sucralfate (CARAFATE) 1 GM/10ML suspension Take 10 mLs (1 g total) by mouth 4 (four) times daily. Patient not taking: Reported on 09/04/2023 02/05/23   Raquel James, NP  traMADol (ULTRAM) 50 MG tablet Take 1 tablet (50 mg total) by mouth every 6 (six) hours as needed. 07/18/23   [name], MD  vitamin B-12 (CYANOCOBALAMIN) 1000 MCG tablet Take 1,000 mcg by mouth every evening. Now (Brand)    [provider]      VITAL SIGNS:  Blood pressure (!) 101/43, pulse (!) 59, temperature 98.3 F (36.8 C), temperature source Oral, resp. rate 17, height 5\' 10"  (1.778 m), weight 90.7 kg, SpO2 95%.  PHYSICAL EXAMINATION:  Physical Exam  GENERAL:  89 y.o.-year-old patient lying in  the bed with no acute distress.  EYES: Pupils equal, round, reactive to light and accommodation. No scleral icterus. Extraocular muscles intact.  HEENT: Head atraumatic, normocephalic. Oropharynx and nasopharynx clear.  NECK:  Supple, no jugular venous distention. No thyroid enlargement, no tenderness.  LUNGS: Normal breath sounds bilaterally, no wheezing, rales,rhonchi or crepitation. No use of accessory muscles of respiration.  CARDIOVASCULAR: Regular rate and rhythm, S1, S2 normal. No murmurs, rubs, or gallops.  ABDOMEN: Soft, nondistended, nontender. Bowel sounds present. No organomegaly or mass.  EXTREMITIES: No pedal edema, cyanosis, or clubbing.  NEUROLOGIC: Cranial nerves II through XII are intact. Muscle strength 5/5 in all extremities. Sensation intact. Gait not checked.  PSYCHIATRIC: The patient is alert and oriented x 3.  Normal affect and good eye contact. SKIN: No obvious rash, lesion, or ulcer.   LABORATORY PANEL:   CBC Recent Labs  Lab 09/16/23 1220  WBC 8.2  HGB 11.8*  HCT 35.0*  PLT 174   ------------------------------------------------------------------------------------------------------------------  Chemistries  Recent Labs  Lab 09/16/23 1220  NA 140  K 4.0  CL 107  CO2 22  GLUCOSE 145*  BUN 26*  CREATININE 1.38*  CALCIUM 8.9  AST 213*  ALT 167*  ALKPHOS 198*  BILITOT 2.4*   ------------------------------------------------------------------------------------------------------------------  Cardiac Enzymes No results for input(s): "TROPONINI" in the last 168 hours. ------------------------------------------------------------------------------------------------------------------  RADIOLOGY:  CT Angio Chest/Abd/Pel for Dissection W and/or Wo Contrast Result Date: 09/16/2023 CLINICAL DATA:  Acute aortic syndrome (AAS) suspected. Mild chest tightening. Shortness of breath. History of metastatic colon cancer. Liver ablation. * Tracking Code: BO * EXAM:  CT ANGIOGRAPHY CHEST, ABDOMEN AND PELVIS TECHNIQUE: Non-contrast CT of the chest was initially obtained. Multidetector CT imaging through the chest, abdomen and pelvis was performed using the standard protocol during bolus administration of intravenous contrast. Multiplanar reconstructed images and MIPs were obtained and reviewed to evaluate the vascular anatomy. RADIATION DOSE REDUCTION: This exam was performed according to the departmental dose-optimization program which includes automated exposure control, adjustment of the mA and/or kV according to patient size and/or use of iterative reconstruction technique. CONTRAST:  OMNIPAQUE IOHEXOL 350 MG/ML SOLN COMPARISON:  CT scan chest from  07/02/2023 and PET-CT scan from 07/19/2023. FINDINGS: CTA CHEST FINDINGS Cardiovascular: No intramural hematoma noted in the thoracic aorta on the unenhanced images. Thoracic aorta is normal in caliber without aneurysm, dissection, vasculitis or significant stenosis. There is satisfactory opacification of bilateral pulmonary artery branches up to the segmental level. No filling defects noted to suggest embolism to the segmental artery level. Normal cardiac size. No pericardial effusion. No aortic aneurysm. There are coronary artery calcifications, in keeping with coronary artery disease. There are also moderate to severe peripheral atherosclerotic vascular calcifications of thoracic aorta and its major branches. Mediastinum/Nodes: Visualized thyroid gland appears grossly unremarkable. No solid / cystic mediastinal masses. The esophagus is nondistended precluding optimal assessment. No axillary, mediastinal or hilar lymphadenopathy by size criteria. Lungs/Pleura: The central tracheo-bronchial tree is patent. There are 3, solid noncalcified nodules in the bilateral lungs (series 100, images 84, 120 and 135), which have increased in size in the prior study and compatible with metastases. The largest nodule is in the right lung  lower lobe and measures 8.5 x 9.3 mm. There is an additional sub-4 mm noncalcified nodule in the right lung lower lobe (series 100, image 119), which is unchanged since several prior studies and favored benign in etiology. No mass or consolidation. No pleural effusion or pneumothorax. Musculoskeletal: The visualized soft tissues of the chest wall are grossly unremarkable. No suspicious osseous lesions. There are mild multilevel degenerative changes in the visualized spine. Review of the MIP images confirms the above findings. CTA ABDOMEN AND PELVIS FINDINGS VASCULAR Aorta: Normal caliber aorta without aneurysm, dissection, vasculitis or significant stenosis. Celiac: Patent without evidence of aneurysm, dissection, vasculitis or significant stenosis. SMA: Patent without evidence of aneurysm, dissection, vasculitis or significant stenosis. Renals: Both renal arteries are patent without evidence of aneurysm, dissection, vasculitis, fibromuscular dysplasia or significant stenosis. IMA: Patent without evidence of aneurysm, dissection, vasculitis or significant stenosis. Inflow: Patent without evidence of aneurysm, dissection, vasculitis or significant stenosis. Veins: No obvious venous abnormality within the limitations of this arterial phase study. Review of the MIP images confirms the above findings. NON-VASCULAR Hepatobiliary: The liver is normal in size. Non-cirrhotic configuration. Redemonstration of heterogeneous hypoattenuating mass in the right hepatic lobe, segment 6 measuring 4.3 x 5.8 cm, compatible with metastasis. There is interval increase in the size of the lesion when compared to the prior PET-CT scan from 07/19/2023. No new suspicious liver lesions seen. No intrahepatic or extrahepatic bile duct dilation. Gallbladder is distended. There is mild diffuse wall thickening. No calcified gallstones noted. However, there is subtle pericholecystic fat stranding. Findings are equivocal for acute cholecystitis on  the basis of CT scan. Correlate clinically and with liver function tests to determine the need for additional imaging. Pancreas: Unremarkable. No pancreatic ductal dilatation or surrounding inflammatory changes. Spleen: Within normal limits. No focal lesion. Adrenals/Urinary Tract: Adrenal glands are unremarkable. No suspicious renal mass. Multiple bilateral simple renal cysts noted with largest arising from the right kidney measuring 4.2 x 5.8 cm. No nephroureterolithiasis or obstructive uropathy. Unremarkable urinary bladder. Stomach/Bowel: There is a small sliding hiatal hernia. Patient is status post right hemicolectomy with ileocolonic anastomosis in the right upper quadrant. No disproportionate dilation of the small or large bowel loops. No evidence of abnormal bowel wall thickening or inflammatory changes. There are multiple diverticula throughout the colon, without imaging signs of diverticulitis. Vascular/Lymphatic: No ascites or pneumoperitoneum. No abdominal or pelvic lymphadenopathy, by size criteria. No aneurysmal dilation of the major abdominal arteries. There are moderate peripheral atherosclerotic vascular calcifications  of the aorta and its major branches. Reproductive: Enlarged prostate. Symmetric seminal vesicles. Other: There is a small right paramedian supraumbilical ventral hernia containing fat and portion of unobstructed transverse colon. There is also a small fat containing left inguinal hernia. The soft tissues and abdominal wall are otherwise unremarkable. Musculoskeletal: No suspicious osseous lesions. There are mild - moderate multilevel degenerative changes in the visualized spine. Mild compression deformity of L3 vertebral body, similar to the prior study. Review of the MIP images confirms the above findings. IMPRESSION: 1. No acute aortic syndrome. No thoracic aortic acute intramural hematoma. No thoracoabdominal aortic aneurysm, dissection or penetrating atherosclerotic ulcer. There  are moderate to severe peripheral atherosclerotic vascular calcifications of the major arteries of the chest, abdomen and pelvis. 2. There are 3 enlarging, subcentimeter, solid pulmonary nodules, suggesting worsening metastases. 3. Mild interval increase in the patient's known ablated right hepatic lobe liver lesion as well. No new liver lesion seen. 4. No other metastatic disease identified within the chest, abdomen or pelvis. 5. Multiple other nonacute observations, as described above. Aortic Atherosclerosis (ICD10-I70.0). Electronically Signed   By: Jules Schick M.D.   On: 09/16/2023 14:53   DG Chest Portable 1 View Result Date: 09/16/2023 CLINICAL DATA:  89 year old male with history of chest tightness. EXAM: PORTABLE CHEST 1 VIEW COMPARISON:  Chest x-ray 02/07/2023. FINDINGS: Chronic elevation of the right hemidiaphragm again noted. No acute consolidative airspace disease. No pleural effusions. No pneumothorax. No evidence of pulmonary edema. Heart size is upper limits of normal. Upper mediastinal contours are within normal limits. Atherosclerotic calcifications are noted in the thoracic aorta. IMPRESSION: 1. No radiographic evidence of acute cardiopulmonary disease. 2. Aortic atherosclerosis. Electronically Signed   By: Trudie Reed M.D.   On: 09/16/2023 13:00      IMPRESSION AND PLAN:  Assessment and Plan: No notes have been filed under this hospital service. Service: Hospitalist      DVT prophylaxis: Lovenox***  Advanced Care Planning:  Code Status: full code***  Family Communication:  The plan of care was discussed in details with the patient (and family). I answered all questions. The patient agreed to proceed with the above mentioned plan. Further management will depend upon hospital course. Disposition Plan: Back to previous home environment Consults called: none***  All the records are reviewed and case discussed with ED provider.  Status is:  Inpatient {Inpatient:23812}   At the time of the admission, it appears that the appropriate admission status for this patient is inpatient.  This is judged to be reasonable and necessary in order to provide the required intensity of service to ensure the patient's safety given the presenting symptoms, physical exam findings and initial radiographic and laboratory data in the context of comorbid conditions.  The patient requires inpatient status due to high intensity of service, high risk of further deterioration and high frequency of surveillance required.  I certify that at the time of admission, it is my clinical judgment that the patient will require inpatient hospital care extending more than 2 midnights.                            Dispo: The patient is from: Home              Anticipated d/c is to: Home              Patient currently is not medically stable to d/c.  Difficult to place patient: No  Hannah Beat M.D on 09/16/2023 at 9:54 PM  [facility]   From 7 PM-7 AM, contact night-coverage [email or web address]  CC: Primary care physician; [name], MD

## 2023-09-16 NOTE — ED Notes (Signed)
ED TO INPATIENT HANDOFF REPORT  ED Nurse Name and Phone #: [phone]  S Name/Age/Gender Herbert Wood 89 y.o. male Room/Bed: APA08/APA08  Code Status   Code Status: Full Code  Home/SNF/Other Home Patient oriented to: self, place, time, and situation Is this baseline? Yes   Triage Complete: Triage complete  Chief Complaint Fever of unknown origin [R50.9]  Triage Note Pt with mid chest tightness this morning. + SOB  wife states pain radiated between shoulder blades. Pt took TUMS PTA and denies pain at present.    Allergies Allergies  Allergen Reactions   Gadobenate Nausea And Vomiting    Immediately upon the infusion of 15mL multihance contrast  Patient had exteme nausea and vomiting.   No other symptoms noted .  No injury.  MRI scan was completed after patient felt better.   Immediately upon the infusion of 15mL multihance contrast  Patient had exteme nausea and vomiting.   No other symptoms noted .  No injury.  MRI scan was completed after patient felt better.       Level of Care/Admitting Diagnosis ED Disposition     ED Disposition  Admit   Condition  --   Comment  Hospital Area: [facility] [[id number]]  Level of Care: Telemetry [5]  Covid Evaluation: Asymptomatic - no recent exposure (last 10 days) testing not required  Diagnosis: Fever of unknown origin [[id number]]  Admitting Physician: Hannah Beat [[id number]]  Attending Physician: Hannah Beat [[id number]]  Certification:: I certify this patient will need inpatient services for at least 2 midnights  Expected Medical Readiness: 09/18/2023          B Medical/Surgery History Past Medical History:  Diagnosis Date   Adenomatous colon polyp    Anemia    Carotid bruit    Chronic dyspnea    Diabetes (HCC)    type 2   Diverticular disease    DVT (deep venous thrombosis) (HCC)    lower limb   Dyspnea    Factor V deficiency (HCC)    Hearing loss    bilateral   Hyperlipemia    Hypertension    Inguinal  hernia    Kidney disease    stage 3   Lung nodule    Lymphadenopathy    Malignant neoplasm (HCC)    salivary gland   Murmur    Osteoarthritis    Senile purpura (HCC)    Past Surgical History:  Procedure Laterality Date   BIOPSY  01/10/2022   Procedure: BIOPSY;  Surgeon: Dolores Frame, MD;  [facility];  Service: Gastroenterology;;   COLON SURGERY     COLONOSCOPY WITH PROPOFOL N/A 01/10/2022   Procedure: COLONOSCOPY WITH PROPOFOL;  Surgeon: Dolores Frame, MD;  [facility];  Service: Gastroenterology;  Laterality: N/A;  per Soledad Gerlach, pt to arrive at 9:15   COLONOSCOPY WITH PROPOFOL N/A 02/27/2023   Procedure: COLONOSCOPY WITH PROPOFOL;  Surgeon: Dolores Frame, MD;  [facility];  Service: Gastroenterology;  Laterality: N/A;  9:15am;asa 3   ESOPHAGOGASTRODUODENOSCOPY (EGD) WITH PROPOFOL N/A 01/10/2022   Procedure: ESOPHAGOGASTRODUODENOSCOPY (EGD) WITH PROPOFOL;  Surgeon: Dolores Frame, MD;  [facility];  Service: Gastroenterology;  Laterality: N/A;   HAND / FINGER LESION EXCISION     HERNIA REPAIR     IR RADIOLOGIST EVAL & MGMT  04/04/2023   IR RADIOLOGIST EVAL & MGMT  08/09/2023   MASTOIDECTOMY     PILONIDAL CYST EXCISION  POLYPECTOMY  01/10/2022   Procedure: POLYPECTOMY;  Surgeon: Dolores Frame, MD;  [facility];  Service: Gastroenterology;;   POLYPECTOMY  02/27/2023   Procedure: POLYPECTOMY;  Surgeon: Dolores Frame, MD;  [facility];  Service: Gastroenterology;;   RADIOLOGY WITH ANESTHESIA N/A 12/27/2022   Procedure: CT microwave ablation of the liver;  Surgeon: Irish Lack, MD;  Location: [facility];  Service: Radiology;  Laterality: N/A;   SUBMUCOSAL TATTOO INJECTION  01/10/2022   Procedure: SUBMUCOSAL TATTOO INJECTION;  Surgeon: Marguerita Merles, Reuel Boom, MD;  [facility];  Service: Gastroenterology;;   SURGERY OF LIP      skin cancer     A IV Location/Drains/Wounds Patient Lines/Drains/Airways Status     Active Line/Drains/Airways     Name Placement date Placement time Site Days   Peripheral IV 09/16/23 20 G Anterior;Distal;Right;Upper Arm 09/16/23  2154  Arm  less than 1            Intake/Output Last 24 hours No intake or output data in the 24 hours ending 09/16/23 2204  Labs/Imaging Results for orders placed or performed during the hospital encounter of 09/16/23 (from the past 48 hours)  Basic metabolic panel     Status: Abnormal   Collection Time: 09/16/23 12:20 PM  Result Value Ref Range   Sodium 140 135 - 145 mmol/L   Potassium 4.0 3.5 - 5.1 mmol/L   Chloride 107 98 - 111 mmol/L   CO2 22 22 - 32 mmol/L   Glucose, Bld 145 (H) 70 - 99 mg/dL    Comment: Glucose reference range applies only to samples taken after fasting for at least 8 hours.   BUN 26 (H) 8 - 23 mg/dL   Creatinine, Ser 7.82 (H) 0.61 - 1.24 mg/dL   Calcium 8.9 8.9 - 95.6 mg/dL   GFR, Estimated 50 (L) >60 mL/min    Comment: (NOTE) Calculated using the CKD-EPI Creatinine Equation (2021)    Anion gap 11 5 - 15    Comment: Performed at [facility], [address], [place], [place] [address]  CBC     Status: Abnormal   Collection Time: 09/16/23 12:20 PM  Result Value Ref Range   WBC 8.2 4.0 - 10.5 K/uL   RBC 3.49 (L) 4.22 - 5.81 MIL/uL   Hemoglobin 11.8 (L) 13.0 - 17.0 g/dL   HCT 65.7 (L) 84.6 - 96.2 %   MCV 100.3 (H) 80.0 - 100.0 fL   MCH 33.8 26.0 - 34.0 pg   MCHC 33.7 30.0 - 36.0 g/dL   RDW 95.2 84.1 - 32.4 %   Platelets 174 150 - 400 K/uL   nRBC 0.0 0.0 - 0.2 %    Comment: Performed at [facility], [address], [place], [place] [address]  Troponin I (High Sensitivity)     Status: None   Collection Time: 09/16/23 12:20 PM  Result Value Ref Range   Troponin I (High Sensitivity) 10 <18 ng/L    Comment: (NOTE) Elevated high sensitivity troponin I (hsTnI) values and significant  changes across serial  measurements may suggest ACS but many other  chronic and acute conditions are known to elevate hsTnI results.  Refer to the "Links" section for chest pain algorithms and additional  guidance. Performed at [facility], [address] [place], [place] [address]   Hepatic function panel     Status: Abnormal   Collection Time: 09/16/23 12:20 PM  Result Value Ref Range   Total Protein 6.7  6.5 - 8.1 g/dL   Albumin 3.6 3.5 - 5.0 g/dL   AST 161 (H) 15 - 41 U/L   ALT 167 (H) 0 - 44 U/L   Alkaline Phosphatase 198 (H) 38 - 126 U/L   Total Bilirubin 2.4 (H) 0.0 - 1.2 mg/dL   Bilirubin, Direct 1.0 (H) 0.0 - 0.2 mg/dL   Indirect Bilirubin 1.4 (H) 0.3 - 0.9 mg/dL    Comment: Performed at [facility], [address] [place], [place] [address]  Troponin I (High Sensitivity)     Status: None   Collection Time: 09/16/23  2:05 PM  Result Value Ref Range   Troponin I (High Sensitivity) 13 <18 ng/L    Comment: (NOTE) Elevated high sensitivity troponin I (hsTnI) values and significant  changes across serial measurements may suggest ACS but many other  chronic and acute conditions are known to elevate hsTnI results.  Refer to the "Links" section for chest pain algorithms and additional  guidance. Performed at [facility], [address], [place], [place] [address]   Urinalysis, w/ Reflex to Culture (Infection Suspected) -Urine, Clean Catch     Status: Abnormal   Collection Time: 09/16/23  6:45 PM  Result Value Ref Range   Specimen Source URINE, CLEAN CATCH    Color, Urine AMBER (A) YELLOW    Comment: BIOCHEMICALS MAY BE AFFECTED BY COLOR   APPearance CLEAR CLEAR   Specific Gravity, Urine >1.046 (H) 1.005 - 1.030   pH 5.0 5.0 - 8.0   Glucose, UA NEGATIVE NEGATIVE mg/dL   Hgb urine dipstick NEGATIVE NEGATIVE   Bilirubin Urine NEGATIVE NEGATIVE   Ketones, ur NEGATIVE NEGATIVE mg/dL   Protein, ur 119 (A) NEGATIVE mg/dL   Nitrite NEGATIVE NEGATIVE   Leukocytes,Ua NEGATIVE NEGATIVE   RBC / HPF 0-5  0 - 5 RBC/hpf   WBC, UA 0-5 0 - 5 WBC/hpf    Comment:        Reflex urine culture not performed if WBC <=10, OR if Squamous epithelial cells >5. If Squamous epithelial cells >5 suggest recollection.    Bacteria, UA NONE SEEN NONE SEEN   Squamous Epithelial / HPF 0-5 0 - 5 /HPF   Mucus PRESENT     Comment: Performed at [facility], [address], [place], [place] [address]  Resp panel by RT-PCR (RSV, Flu A&B, Covid) Anterior Nasal Swab     Status: None   Collection Time: 09/16/23  7:16 PM   Specimen: Anterior Nasal Swab  Result Value Ref Range   SARS Coronavirus 2 by RT PCR NEGATIVE NEGATIVE    Comment: (NOTE) SARS-CoV-2 target nucleic acids are NOT DETECTED.  The SARS-CoV-2 RNA is generally detectable in upper respiratory specimens during the acute phase of infection. The lowest concentration of SARS-CoV-2 viral copies this assay can detect is 138 copies/mL. A negative result does not preclude SARS-Cov-2 infection and should not be used as the sole basis for treatment or other patient management decisions. A negative result may occur with  improper specimen collection/handling, submission of specimen other than nasopharyngeal swab, presence of viral mutation(s) within the areas targeted by this assay, and inadequate number of viral copies(<138 copies/mL). A negative result must be combined with clinical observations, patient history, and epidemiological information. The expected result is Negative.  Fact Sheet for Patients:  BloggerCourse.com  Fact Sheet for Healthcare Providers:  SeriousBroker.it  This test is no t yet approved or cleared by the [place] FDA and  has been authorized for detection and/or  diagnosis of SARS-CoV-2 by FDA under an Emergency Use Authorization (EUA). This EUA will remain  in effect (meaning this test can be used) for the duration of the COVID-19 declaration under Section 564(b)(1) of the Act,  21 U.S.C.section 360bbb-3(b)(1), unless the authorization is terminated  or revoked sooner.       Influenza A by PCR NEGATIVE NEGATIVE   Influenza B by PCR NEGATIVE NEGATIVE    Comment: (NOTE) The Xpert Xpress SARS-CoV-2/FLU/RSV plus assay is intended as an aid in the diagnosis of influenza from Nasopharyngeal swab specimens and should not be used as a sole basis for treatment. Nasal washings and aspirates are unacceptable for Xpert Xpress SARS-CoV-2/FLU/RSV testing.  Fact Sheet for Patients: BloggerCourse.com  Fact Sheet for Healthcare Providers: SeriousBroker.it  This test is not yet approved or cleared by the [place] FDA and has been authorized for detection and/or diagnosis of SARS-CoV-2 by FDA under an Emergency Use Authorization (EUA). This EUA will remain in effect (meaning this test can be used) for the duration of the COVID-19 declaration under Section 564(b)(1) of the Act, 21 U.S.C. section 360bbb-3(b)(1), unless the authorization is terminated or revoked.     Resp Syncytial Virus by PCR NEGATIVE NEGATIVE    Comment: (NOTE) Fact Sheet for Patients: BloggerCourse.com  Fact Sheet for Healthcare Providers: SeriousBroker.it  This test is not yet approved or cleared by the [place] FDA and has been authorized for detection and/or diagnosis of SARS-CoV-2 by FDA under an Emergency Use Authorization (EUA). This EUA will remain in effect (meaning this test can be used) for the duration of the COVID-19 declaration under Section 564(b)(1) of the Act, 21 U.S.C. section 360bbb-3(b)(1), unless the authorization is terminated or revoked.  Performed at [facility], [address], [place], [place] [address]    CT Angio Chest/Abd/Pel for Dissection W and/or Wo Contrast Result Date: 09/16/2023 CLINICAL DATA:  Acute aortic syndrome (AAS) suspected. Mild chest  tightening. Shortness of breath. History of metastatic colon cancer. Liver ablation. * Tracking Code: BO * EXAM: CT ANGIOGRAPHY CHEST, ABDOMEN AND PELVIS TECHNIQUE: Non-contrast CT of the chest was initially obtained. Multidetector CT imaging through the chest, abdomen and pelvis was performed using the standard protocol during bolus administration of intravenous contrast. Multiplanar reconstructed images and MIPs were obtained and reviewed to evaluate the vascular anatomy. RADIATION DOSE REDUCTION: This exam was performed according to the departmental dose-optimization program which includes automated exposure control, adjustment of the mA and/or kV according to patient size and/or use of iterative reconstruction technique. CONTRAST:  OMNIPAQUE IOHEXOL 350 MG/ML SOLN COMPARISON:  CT scan chest from 07/02/2023 and PET-CT scan from 07/19/2023. FINDINGS: CTA CHEST FINDINGS Cardiovascular: No intramural hematoma noted in the thoracic aorta on the unenhanced images. Thoracic aorta is normal in caliber without aneurysm, dissection, vasculitis or significant stenosis. There is satisfactory opacification of bilateral pulmonary artery branches up to the segmental level. No filling defects noted to suggest embolism to the segmental artery level. Normal cardiac size. No pericardial effusion. No aortic aneurysm. There are coronary artery calcifications, in keeping with coronary artery disease. There are also moderate to severe peripheral atherosclerotic vascular calcifications of thoracic aorta and its major branches. Mediastinum/Nodes: Visualized thyroid gland appears grossly unremarkable. No solid / cystic mediastinal masses. The esophagus is nondistended precluding optimal assessment. No axillary, mediastinal or hilar lymphadenopathy by size criteria. Lungs/Pleura: The central tracheo-bronchial tree is patent. There are 3, solid noncalcified nodules in the bilateral lungs (series 100, images 84, 120 and 135), which  have increased in size in the prior study and compatible with metastases. The largest nodule is in the right lung lower lobe and measures 8.5 x 9.3 mm. There is an additional sub-4 mm noncalcified nodule in the right lung lower lobe (series 100, image 119), which is unchanged since several prior studies and favored benign in etiology. No mass or consolidation. No pleural effusion or pneumothorax. Musculoskeletal: The visualized soft tissues of the chest wall are grossly unremarkable. No suspicious osseous lesions. There are mild multilevel degenerative changes in the visualized spine. Review of the MIP images confirms the above findings. CTA ABDOMEN AND PELVIS FINDINGS VASCULAR Aorta: Normal caliber aorta without aneurysm, dissection, vasculitis or significant stenosis. Celiac: Patent without evidence of aneurysm, dissection, vasculitis or significant stenosis. SMA: Patent without evidence of aneurysm, dissection, vasculitis or significant stenosis. Renals: Both renal arteries are patent without evidence of aneurysm, dissection, vasculitis, fibromuscular dysplasia or significant stenosis. IMA: Patent without evidence of aneurysm, dissection, vasculitis or significant stenosis. Inflow: Patent without evidence of aneurysm, dissection, vasculitis or significant stenosis. Veins: No obvious venous abnormality within the limitations of this arterial phase study. Review of the MIP images confirms the above findings. NON-VASCULAR Hepatobiliary: The liver is normal in size. Non-cirrhotic configuration. Redemonstration of heterogeneous hypoattenuating mass in the right hepatic lobe, segment 6 measuring 4.3 x 5.8 cm, compatible with metastasis. There is interval increase in the size of the lesion when compared to the prior PET-CT scan from 07/19/2023. No new suspicious liver lesions seen. No intrahepatic or extrahepatic bile duct dilation. Gallbladder is distended. There is mild diffuse wall thickening. No calcified gallstones  noted. However, there is subtle pericholecystic fat stranding. Findings are equivocal for acute cholecystitis on the basis of CT scan. Correlate clinically and with liver function tests to determine the need for additional imaging. Pancreas: Unremarkable. No pancreatic ductal dilatation or surrounding inflammatory changes. Spleen: Within normal limits. No focal lesion. Adrenals/Urinary Tract: Adrenal glands are unremarkable. No suspicious renal mass. Multiple bilateral simple renal cysts noted with largest arising from the right kidney measuring 4.2 x 5.8 cm. No nephroureterolithiasis or obstructive uropathy. Unremarkable urinary bladder. Stomach/Bowel: There is a small sliding hiatal hernia. Patient is status post right hemicolectomy with ileocolonic anastomosis in the right upper quadrant. No disproportionate dilation of the small or large bowel loops. No evidence of abnormal bowel wall thickening or inflammatory changes. There are multiple diverticula throughout the colon, without imaging signs of diverticulitis. Vascular/Lymphatic: No ascites or pneumoperitoneum. No abdominal or pelvic lymphadenopathy, by size criteria. No aneurysmal dilation of the major abdominal arteries. There are moderate peripheral atherosclerotic vascular calcifications of the aorta and its major branches. Reproductive: Enlarged prostate. Symmetric seminal vesicles. Other: There is a small right paramedian supraumbilical ventral hernia containing fat and portion of unobstructed transverse colon. There is also a small fat containing left inguinal hernia. The soft tissues and abdominal wall are otherwise unremarkable. Musculoskeletal: No suspicious osseous lesions. There are mild - moderate multilevel degenerative changes in the visualized spine. Mild compression deformity of L3 vertebral body, similar to the prior study. Review of the MIP images confirms the above findings. IMPRESSION: 1. No acute aortic syndrome. No thoracic aortic acute  intramural hematoma. No thoracoabdominal aortic aneurysm, dissection or penetrating atherosclerotic ulcer. There are moderate to severe peripheral atherosclerotic vascular calcifications of the major arteries of the chest, abdomen and pelvis. 2. There are 3 enlarging, subcentimeter, solid pulmonary nodules, suggesting worsening metastases. 3. Mild interval increase in the patient's known ablated right hepatic lobe liver  lesion as well. No new liver lesion seen. 4. No other metastatic disease identified within the chest, abdomen or pelvis. 5. Multiple other nonacute observations, as described above. Aortic Atherosclerosis (ICD10-I70.0). Electronically Signed   By: Jules Schick M.D.   On: 09/16/2023 14:53   DG Chest Portable 1 View Result Date: 09/16/2023 CLINICAL DATA:  89 year old male with history of chest tightness. EXAM: PORTABLE CHEST 1 VIEW COMPARISON:  Chest x-ray 02/07/2023. FINDINGS: Chronic elevation of the right hemidiaphragm again noted. No acute consolidative airspace disease. No pleural effusions. No pneumothorax. No evidence of pulmonary edema. Heart size is upper limits of normal. Upper mediastinal contours are within normal limits. Atherosclerotic calcifications are noted in the thoracic aorta. IMPRESSION: 1. No radiographic evidence of acute cardiopulmonary disease. 2. Aortic atherosclerosis. Electronically Signed   By: Trudie Reed M.D.   On: 09/16/2023 13:00    Pending Labs Unresulted Labs (From admission, onward)     Start     Ordered   09/17/23 0500  Protime-INR  Tomorrow morning,   R        09/16/23 2154   09/17/23 0500  Cortisol-am, blood  Tomorrow morning,   R        09/16/23 2154   09/17/23 0500  CBC  Tomorrow morning,   R        09/16/23 2154   09/17/23 0500  Comprehensive metabolic panel  Tomorrow morning,   R        09/16/23 2154   09/16/23 2115  Blood culture (routine x 2)  BLOOD CULTURE X 2,   R (with STAT occurrences)      09/16/23 2114             Vitals/Pain Today's Vitals   09/16/23 1534 09/16/23 1535 09/16/23 1849 09/16/23 2123  BP: (!) 170/47   (!) 101/43  Pulse: 85  72 (!) 59  Resp: 17     Temp: 98 F (36.7 C)  (!) 102.1 F (38.9 C) 98.3 F (36.8 C)  TempSrc: Oral  Rectal Oral  SpO2: 91%  94% 95%  Weight:      Height:      PainSc:  0-No pain      Isolation Precautions No active isolations  Medications Medications  cefTRIAXone (ROCEPHIN) 1 g in sodium chloride 0.9 % 100 mL IVPB (1 g Intravenous New Bag/Given 09/16/23 2201)  amLODipine (NORVASC) tablet 5 mg (has no administration in time range)  atorvastatin (LIPITOR) tablet 40 mg (has no administration in time range)  carvedilol (COREG) tablet 6.25 mg (has no administration in time range)  hydrALAZINE (APRESOLINE) tablet 50 mg (has no administration in time range)  Finerenone TABS 10 mg (has no administration in time range)  linagliptin (TRADJENTA) tablet 5 mg (has no administration in time range)  pantoprazole (PROTONIX) EC tablet 40 mg (has no administration in time range)  apixaban (ELIQUIS) tablet 5 mg (has no administration in time range)  cyanocobalamin (VITAMIN B12) tablet 1,000 mcg (has no administration in time range)  ferrous sulfate tablet 325 mg (has no administration in time range)  ascorbic acid (VITAMIN C) tablet 1,000 mg (has no administration in time range)  cholecalciferol (VITAMIN D3) 25 MCG (1000 UNIT) tablet 5,000 Units (has no administration in time range)  loratadine (CLARITIN) tablet 10 mg (has no administration in time range)  dorzolamide (TRUSOPT) 2 % ophthalmic solution 1 drop (has no administration in time range)  latanoprost (XALATAN) 0.005 % ophthalmic solution 1 drop (has no administration in time range)  lactated ringers infusion (has no administration in time range)  ceFEPIme (MAXIPIME) 2 g in sodium chloride 0.9 % 100 mL IVPB (has no administration in time range)  metroNIDAZOLE (FLAGYL) IVPB 500 mg (has no administration in  time range)  vancomycin (VANCOCIN) IVPB 1000 mg/200 mL premix (has no administration in time range)  ondansetron (ZOFRAN) tablet 4 mg (has no administration in time range)    Or  ondansetron (ZOFRAN) injection 4 mg (has no administration in time range)  magnesium hydroxide (MILK OF MAGNESIA) suspension 30 mL (has no administration in time range)  traZODone (DESYREL) tablet 50 mg (has no administration in time range)  acetaminophen (TYLENOL) tablet 650 mg (has no administration in time range)    Or  acetaminophen (TYLENOL) suppository 650 mg (has no administration in time range)  folic acid (FOLVITE) tablet 1 mg (has no administration in time range)  ondansetron (ZOFRAN) injection 4 mg (4 mg Intravenous Given 09/16/23 1328)  pantoprazole (PROTONIX) injection 40 mg (40 mg Intravenous Given 09/16/23 1321)  HYDROmorphone (DILAUDID) injection 0.5 mg (0.5 mg Intravenous Given 09/16/23 1321)  iohexol (OMNIPAQUE) 350 MG/ML injection 100 mL (100 mLs Intravenous Contrast Given 09/16/23 1331)  acetaminophen (TYLENOL) tablet 1,000 mg (1,000 mg Oral Given 09/16/23 1917)    Mobility walks     Focused Assessments HOH   R Recommendations: See Admitting Provider Note  Report given to:   Additional Notes:

## 2023-09-16 NOTE — ED Notes (Signed)
Pt having episodes of incontinence, urine is very dark and cloudy, very foul smelling. 2 staff assist to get pt back in bed d/t pt being extremely SOB and unsteady on feet, 2 staff to change pt, very confused and not easily redirectable. RN and MD notified.

## 2023-09-16 NOTE — ED Notes (Signed)
During dc of pt, NT attempted to get pt out of bed to wheel chair and pt became very disoriented and dizzy, O2 dropped to 85% with good pleth and pt had incontinence episode. Wife was concerned for safety at home. Nurse reached out to oncoming Dr Jearld Fenton to reevaluate. Pt has been put on 2 L O2 due to continuation of low O2. Pt was also cleaned and bed sheets changed and put into a gown

## 2023-09-16 NOTE — ED Provider Notes (Signed)
[place] EMERGENCY DEPARTMENT AT [facility] Provider Note   CSN: [id number] Arrival date & time: 09/16/23  1158     History {Add pertinent medical, surgical, social history, OB history to HPI:1} Chief Complaint  Patient presents with   Chest Pain    Herbert Wood is a 89 y.o. male.  Patient has a history of colon cancer with metastatic disease to his lungs and liver.  Patient was complaining of some chest pain that radiated to his back earlier today.  His wife gave him some Tums that seem to help it.  He has not having any pain now.   Chest Pain      Home Medications Prior to Admission medications   Medication Sig Start Date End Date Taking? Authorizing Provider  ondansetron (ZOFRAN-ODT) 4 MG disintegrating tablet 4mg  ODT q4 hours prn nausea/vomit 09/16/23  Yes Bethann Berkshire, MD  albuterol (VENTOLIN HFA) 108 (90 Base) MCG/ACT inhaler Inhale into the lungs every 6 (six) hours as needed for wheezing or shortness of breath. Patient not taking: Reported on 09/04/2023    [provider]  amitriptyline (ELAVIL) 10 MG tablet Take 10 mg by mouth every evening.    [provider]  amLODipine (NORVASC) 5 MG tablet Take 5 mg by mouth 2 (two) times daily.    [provider]  ascorbic acid (VITAMIN C/NATURAL ROSE HIPS) 1000 MG tablet Take 1,000 mg by mouth every evening.    [provider]  atorvastatin (LIPITOR) 40 MG tablet Take 40 mg by mouth every evening.    [provider]  carvedilol (COREG) 6.25 MG tablet Take 6.25 mg by mouth 2 (two) times daily with a meal.    [provider]  Cholecalciferol (VITAMIN D3) 125 MCG (5000 UT) CAPS Take 5,000 Units by mouth every evening.    [provider]  dorzolamide (TRUSOPT) 2 % ophthalmic solution Place 1 drop into the left eye 2 (two) times daily.    [provider]  ELIQUIS 5 MG TABS tablet TAKE 1 TABLET(5 MG) BY MOUTH TWICE DAILY 06/05/23   [name], MD   ferrous sulfate 325 (65 FE) MG tablet Take 325 mg by mouth daily with breakfast.    [provider]  folic acid (FOLVITE) 800 MCG tablet Take 800 mcg by mouth every evening. With B12 Now (Brand)    [provider]  hydrALAZINE (APRESOLINE) 25 MG tablet Take 50 mg by mouth in the morning and at bedtime.    [provider]  HYDROcodone-acetaminophen (NORCO) 5-325 MG tablet Take 1 tablet by mouth every 8 (eight) hours as needed for moderate pain (pain score 4-6). 07/18/23   [name], MD  KERENDIA 10 MG TABS Take 10 mg by mouth daily.    [provider]  latanoprost (XALATAN) 0.005 % ophthalmic solution Place 1 drop into both eyes at bedtime.    [provider]  linagliptin (TRADJENTA) 5 MG TABS tablet Take 5 mg by mouth in the morning.    [provider]  loratadine (CLARITIN) 10 MG tablet Take 10 mg by mouth every morning.    [provider]  Misc Natural Products (PROSTATE SUPPORT PO) Take 1 capsule by mouth every evening. NOW Prostate Support    [provider]  omeprazole (PRILOSEC) 40 MG capsule Take 1 capsule (40 mg total) by mouth daily. 05/21/23   Carlan, Chelsea L, NP  predniSONE (DELTASONE) 50 MG tablet Take 1 tablet 13 hours prior to scan, 7 hours prior  to scan and 1 hour prior to scan for contrast allergy Take 50 mg Benadryl 1 hour prior to scan 06/27/23   [name], MD  sucralfate (CARAFATE) 1 GM/10ML suspension Take 10 mLs (1 g total) by mouth 4 (four) times daily. Patient not taking: Reported on 09/04/2023 02/05/23   Raquel James, NP  traMADol (ULTRAM) 50 MG tablet Take 1 tablet (50 mg total) by mouth every 6 (six) hours as needed. 07/18/23   [name], MD  vitamin B-12 (CYANOCOBALAMIN) 1000 MCG tablet Take 1,000 mcg by mouth every evening. Now (Brand)    [provider]      Allergies    Gadobenate    Review of Systems   Review of Systems  Cardiovascular:  Positive  for chest pain.    Physical Exam Updated Vital Signs BP (!) 170/47   Pulse 85   Temp 98 F (36.7 C) (Oral)   Resp 17   Ht 5\' 10"  (1.778 m)   Wt 90.7 kg   SpO2 91%   BMI 28.70 kg/m  Physical Exam  ED Results / Procedures / Treatments   Labs (all labs ordered are listed, but only abnormal results are displayed) Labs Reviewed  BASIC METABOLIC PANEL - Abnormal; Notable for the following components:      Result Value   Glucose, Bld 145 (*)    BUN 26 (*)    Creatinine, Ser 1.38 (*)    GFR, Estimated 50 (*)    All other components within normal limits  CBC - Abnormal; Notable for the following components:   RBC 3.49 (*)    Hemoglobin 11.8 (*)    HCT 35.0 (*)    MCV 100.3 (*)    All other components within normal limits  HEPATIC FUNCTION PANEL - Abnormal; Notable for the following components:   AST 213 (*)    ALT 167 (*)    Alkaline Phosphatase 198 (*)    Total Bilirubin 2.4 (*)    Bilirubin, Direct 1.0 (*)    Indirect Bilirubin 1.4 (*)    All other components within normal limits  TROPONIN I (HIGH SENSITIVITY)  TROPONIN I (HIGH SENSITIVITY)    EKG EKG Interpretation Date/Time:  Sunday September 16 2023 12:05:23 EST Ventricular Rate:  60 PR Interval:  184 QRS Duration:  106 QT Interval:  430 QTC Calculation: 430 R Axis:   -42  Text Interpretation: Normal sinus rhythm Left axis deviation Septal infarct , age undetermined Abnormal ECG When compared with ECG of 07-Feb-2023 12:46, PREVIOUS ECG IS PRESENT Confirmed by Bethann Berkshire [phone]) on 09/16/2023 12:06:37 PM  Radiology CT Angio Chest/Abd/Pel for Dissection W and/or Wo Contrast Result Date: 09/16/2023 CLINICAL DATA:  Acute aortic syndrome (AAS) suspected. Mild chest tightening. Shortness of breath. History of metastatic colon cancer. Liver ablation. * Tracking Code: BO * EXAM: CT ANGIOGRAPHY CHEST, ABDOMEN AND PELVIS TECHNIQUE: Non-contrast CT of the chest was initially obtained. Multidetector CT imaging through  the chest, abdomen and pelvis was performed using the standard protocol during bolus administration of intravenous contrast. Multiplanar reconstructed images and MIPs were obtained and reviewed to evaluate the vascular anatomy. RADIATION DOSE REDUCTION: This exam was performed according to the departmental dose-optimization program which includes automated exposure control, adjustment of the mA and/or kV according to patient size and/or use of iterative reconstruction technique. CONTRAST:  OMNIPAQUE IOHEXOL 350 MG/ML SOLN COMPARISON:  CT scan chest from 07/02/2023 and PET-CT scan from 07/19/2023. FINDINGS: CTA CHEST FINDINGS Cardiovascular: No intramural hematoma noted  in the thoracic aorta on the unenhanced images. Thoracic aorta is normal in caliber without aneurysm, dissection, vasculitis or significant stenosis. There is satisfactory opacification of bilateral pulmonary artery branches up to the segmental level. No filling defects noted to suggest embolism to the segmental artery level. Normal cardiac size. No pericardial effusion. No aortic aneurysm. There are coronary artery calcifications, in keeping with coronary artery disease. There are also moderate to severe peripheral atherosclerotic vascular calcifications of thoracic aorta and its major branches. Mediastinum/Nodes: Visualized thyroid gland appears grossly unremarkable. No solid / cystic mediastinal masses. The esophagus is nondistended precluding optimal assessment. No axillary, mediastinal or hilar lymphadenopathy by size criteria. Lungs/Pleura: The central tracheo-bronchial tree is patent. There are 3, solid noncalcified nodules in the bilateral lungs (series 100, images 84, 120 and 135), which have increased in size in the prior study and compatible with metastases. The largest nodule is in the right lung lower lobe and measures 8.5 x 9.3 mm. There is an additional sub-4 mm noncalcified nodule in the right lung lower lobe (series 100, image  119), which is unchanged since several prior studies and favored benign in etiology. No mass or consolidation. No pleural effusion or pneumothorax. Musculoskeletal: The visualized soft tissues of the chest wall are grossly unremarkable. No suspicious osseous lesions. There are mild multilevel degenerative changes in the visualized spine. Review of the MIP images confirms the above findings. CTA ABDOMEN AND PELVIS FINDINGS VASCULAR Aorta: Normal caliber aorta without aneurysm, dissection, vasculitis or significant stenosis. Celiac: Patent without evidence of aneurysm, dissection, vasculitis or significant stenosis. SMA: Patent without evidence of aneurysm, dissection, vasculitis or significant stenosis. Renals: Both renal arteries are patent without evidence of aneurysm, dissection, vasculitis, fibromuscular dysplasia or significant stenosis. IMA: Patent without evidence of aneurysm, dissection, vasculitis or significant stenosis. Inflow: Patent without evidence of aneurysm, dissection, vasculitis or significant stenosis. Veins: No obvious venous abnormality within the limitations of this arterial phase study. Review of the MIP images confirms the above findings. NON-VASCULAR Hepatobiliary: The liver is normal in size. Non-cirrhotic configuration. Redemonstration of heterogeneous hypoattenuating mass in the right hepatic lobe, segment 6 measuring 4.3 x 5.8 cm, compatible with metastasis. There is interval increase in the size of the lesion when compared to the prior PET-CT scan from 07/19/2023. No new suspicious liver lesions seen. No intrahepatic or extrahepatic bile duct dilation. Gallbladder is distended. There is mild diffuse wall thickening. No calcified gallstones noted. However, there is subtle pericholecystic fat stranding. Findings are equivocal for acute cholecystitis on the basis of CT scan. Correlate clinically and with liver function tests to determine the need for additional imaging. Pancreas:  Unremarkable. No pancreatic ductal dilatation or surrounding inflammatory changes. Spleen: Within normal limits. No focal lesion. Adrenals/Urinary Tract: Adrenal glands are unremarkable. No suspicious renal mass. Multiple bilateral simple renal cysts noted with largest arising from the right kidney measuring 4.2 x 5.8 cm. No nephroureterolithiasis or obstructive uropathy. Unremarkable urinary bladder. Stomach/Bowel: There is a small sliding hiatal hernia. Patient is status post right hemicolectomy with ileocolonic anastomosis in the right upper quadrant. No disproportionate dilation of the small or large bowel loops. No evidence of abnormal bowel wall thickening or inflammatory changes. There are multiple diverticula throughout the colon, without imaging signs of diverticulitis. Vascular/Lymphatic: No ascites or pneumoperitoneum. No abdominal or pelvic lymphadenopathy, by size criteria. No aneurysmal dilation of the major abdominal arteries. There are moderate peripheral atherosclerotic vascular calcifications of the aorta and its major branches. Reproductive: Enlarged prostate. Symmetric seminal vesicles. Other: There  is a small right paramedian supraumbilical ventral hernia containing fat and portion of unobstructed transverse colon. There is also a small fat containing left inguinal hernia. The soft tissues and abdominal wall are otherwise unremarkable. Musculoskeletal: No suspicious osseous lesions. There are mild - moderate multilevel degenerative changes in the visualized spine. Mild compression deformity of L3 vertebral body, similar to the prior study. Review of the MIP images confirms the above findings. IMPRESSION: 1. No acute aortic syndrome. No thoracic aortic acute intramural hematoma. No thoracoabdominal aortic aneurysm, dissection or penetrating atherosclerotic ulcer. There are moderate to severe peripheral atherosclerotic vascular calcifications of the major arteries of the chest, abdomen and pelvis.  2. There are 3 enlarging, subcentimeter, solid pulmonary nodules, suggesting worsening metastases. 3. Mild interval increase in the patient's known ablated right hepatic lobe liver lesion as well. No new liver lesion seen. 4. No other metastatic disease identified within the chest, abdomen or pelvis. 5. Multiple other nonacute observations, as described above. Aortic Atherosclerosis (ICD10-I70.0). Electronically Signed   By: Jules Schick M.D.   On: 09/16/2023 14:53   DG Chest Portable 1 View Result Date: 09/16/2023 CLINICAL DATA:  89 year old male with history of chest tightness. EXAM: PORTABLE CHEST 1 VIEW COMPARISON:  Chest x-ray 02/07/2023. FINDINGS: Chronic elevation of the right hemidiaphragm again noted. No acute consolidative airspace disease. No pleural effusions. No pneumothorax. No evidence of pulmonary edema. Heart size is upper limits of normal. Upper mediastinal contours are within normal limits. Atherosclerotic calcifications are noted in the thoracic aorta. IMPRESSION: 1. No radiographic evidence of acute cardiopulmonary disease. 2. Aortic atherosclerosis. Electronically Signed   By: Trudie Reed M.D.   On: 09/16/2023 13:00    Procedures Procedures  {Document cardiac monitor, telemetry assessment procedure when appropriate:1}  Medications Ordered in ED Medications  ondansetron (ZOFRAN) injection 4 mg (4 mg Intravenous Given 09/16/23 1328)  pantoprazole (PROTONIX) injection 40 mg (40 mg Intravenous Given 09/16/23 1321)  HYDROmorphone (DILAUDID) injection 0.5 mg (0.5 mg Intravenous Given 09/16/23 1321)  iohexol (OMNIPAQUE) 350 MG/ML injection 100 mL (100 mLs Intravenous Contrast Given 09/16/23 1331)    ED Course/ Medical Decision Making/ A&P   {   Click here for ABCD2, HEART and other calculatorsREFRESH Note before signing :1}                              Medical Decision Making Amount and/or Complexity of Data Reviewed Labs: ordered. Radiology: ordered.  Risk Prescription  drug management.   Patient with atypical chest pain that has improved with Tums.  Patient CT angio of his chest and abdomen did not show any acute disease with normal troponins.  He will be discharged home with Zofran and will follow-up with his doctor  {Document critical care time when appropriate:1} {Document review of labs and clinical decision tools ie heart score, Chads2Vasc2 etc:1}  {Document your independent review of radiology images, and any outside records:1} {Document your discussion with family members, caretakers, and with consultants:1} {Document social determinants of health affecting pt's care:1} {Document your decision making why or why not admission, treatments were needed:1} Final Clinical Impression(s) / ED Diagnoses Final diagnoses:  Atypical chest pain    Rx / DC Orders ED Discharge Orders          Ordered    ondansetron (ZOFRAN-ODT) 4 MG disintegrating tablet        09/16/23 1534

## 2023-09-16 NOTE — ED Provider Notes (Signed)
Assumed care of patient from off-going team. For more details, please see note from same day.  In brief, this is a 89 y.o. male who on attempted discharge from the hospital for presenting for chest pain and shortness of breath was found to be very weak.  He had previously walked into the emergency department but was so weak upon attempted discharge he could not stand from the bed.  Also was found to be intermittently confused by the wife and had an episode of incontinence, which is not normal for him.  Also rectal temperature found to be 102 F.  Patient's lab results demonstrate no leukocytosis, mild anemia, negative respiratory panel, negative troponin x 2, unremarkable BMP, and negative UA for UTI.  He does have mildly elevated liver function tests but no evidence of cholecystitis on his CT chest abdomen pelvis.  Ultrasounds unavailable at this time.  He does have a right liver lesion that is known and had a planned ablation of this lesion for Wednesday of this week.  He does not have any right upper quadrant tenderness to palpation.  He is intermittently and mildly hypoxic to the upper 80s.  CT of the chest did also not demonstrate any pulmonary embolism or pneumonia. He has no resp distress. Blood cultures are drawn and started on broad spectrum abx. Admitted to medicine.    BP (!) 101/43   Pulse (!) 59   Temp 98.3 F (36.8 C) (Oral)   Resp 17   Ht 5\' 10"  (1.778 m)   Wt 90.7 kg   SpO2 95%   BMI 28.70 kg/m    ED Course:   Clinical Course as of 09/16/23 2138  Sun Sep 16, 2023  1849 Rectal temp 102.81F, will give tylenol.  [HN]  1915 Urinalysis, w/ Reflex to Culture (Infection Suspected) -Urine, Clean Catch(!) Negative [HN]    Clinical Course User Index [HN] Loetta Rough, MD    Dispo: Admit ------------------------------- Vivi Barrack, MD Emergency Medicine  This note was created using dictation software, which may contain spelling or grammatical errors.   Loetta Rough,  MD 09/16/23 [phone]

## 2023-09-17 ENCOUNTER — Inpatient Hospital Stay (HOSPITAL_COMMUNITY): Payer: Medicare Other

## 2023-09-17 DIAGNOSIS — K219 Gastro-esophageal reflux disease without esophagitis: Secondary | ICD-10-CM | POA: Diagnosis present

## 2023-09-17 DIAGNOSIS — R7989 Other specified abnormal findings of blood chemistry: Secondary | ICD-10-CM

## 2023-09-17 DIAGNOSIS — E785 Hyperlipidemia, unspecified: Secondary | ICD-10-CM | POA: Diagnosis present

## 2023-09-17 DIAGNOSIS — E1122 Type 2 diabetes mellitus with diabetic chronic kidney disease: Secondary | ICD-10-CM | POA: Diagnosis present

## 2023-09-17 DIAGNOSIS — I10 Essential (primary) hypertension: Secondary | ICD-10-CM | POA: Diagnosis present

## 2023-09-17 DIAGNOSIS — D6851 Activated protein C resistance: Secondary | ICD-10-CM

## 2023-09-17 LAB — BLOOD CULTURE ID PANEL (REFLEXED) - BCID2

## 2023-09-17 LAB — HEPATITIS PANEL, ACUTE
HCV Ab: NONREACTIVE
Hep A IgM: NONREACTIVE
Hep B C IgM: NONREACTIVE
Hepatitis B Surface Ag: NONREACTIVE

## 2023-09-17 LAB — COMPREHENSIVE METABOLIC PANEL
ALT: 316 U/L — ABNORMAL HIGH (ref 0–44)
AST: 285 U/L — ABNORMAL HIGH (ref 15–41)
Albumin: 3 g/dL — ABNORMAL LOW (ref 3.5–5.0)
Alkaline Phosphatase: 207 U/L — ABNORMAL HIGH (ref 38–126)
Anion gap: 8 (ref 5–15)
BUN: 28 mg/dL — ABNORMAL HIGH (ref 8–23)
CO2: 22 mmol/L (ref 22–32)
Calcium: 8.2 mg/dL — ABNORMAL LOW (ref 8.9–10.3)
Chloride: 107 mmol/L (ref 98–111)
Creatinine, Ser: 1.38 mg/dL — ABNORMAL HIGH (ref 0.61–1.24)
GFR, Estimated: 50 mL/min — ABNORMAL LOW (ref >60)
Glucose, Bld: 107 mg/dL — ABNORMAL HIGH (ref 70–99)
Potassium: 3.5 mmol/L (ref 3.5–5.1)
Sodium: 137 mmol/L (ref 135–145)
Total Bilirubin: 3.8 mg/dL — ABNORMAL HIGH (ref 0.0–1.2)
Total Protein: 5.5 g/dL — ABNORMAL LOW (ref 6.5–8.1)

## 2023-09-17 LAB — CBC
HCT: 29.6 % — ABNORMAL LOW (ref 39.0–52.0)
Hemoglobin: 10.1 g/dL — ABNORMAL LOW (ref 13.0–17.0)
MCH: 34.2 pg — ABNORMAL HIGH (ref 26.0–34.0)
MCHC: 34.1 g/dL (ref 30.0–36.0)
MCV: 100.3 fL — ABNORMAL HIGH (ref 80.0–100.0)
Platelets: 124 10*3/uL — ABNORMAL LOW (ref 150–400)
RBC: 2.95 MIL/uL — ABNORMAL LOW (ref 4.22–5.81)
RDW: 13.4 % (ref 11.5–15.5)
WBC: 11.2 10*3/uL — ABNORMAL HIGH (ref 4.0–10.5)
nRBC: 0 % (ref 0.0–0.2)

## 2023-09-17 LAB — GLUCOSE, CAPILLARY
Glucose-Capillary: 125 mg/dL — ABNORMAL HIGH (ref 70–99)
Glucose-Capillary: 140 mg/dL — ABNORMAL HIGH (ref 70–99)

## 2023-09-17 LAB — PROTIME-INR
INR: 1.6 — ABNORMAL HIGH (ref 0.8–1.2)
Prothrombin Time: 19.6 s — ABNORMAL HIGH (ref 11.4–15.2)

## 2023-09-17 LAB — CORTISOL-AM, BLOOD: Cortisol - AM: 22.3 ug/dL (ref 6.7–22.6)

## 2023-09-17 MED ORDER — INSULIN ASPART 100 UNIT/ML IJ SOLN
0.0000 [IU] | Freq: Three times a day (TID) | INTRAMUSCULAR | Status: DC
  Administered 2023-09-17 – 2023-09-18 (×2): 2 [IU] via SUBCUTANEOUS
  Administered 2023-09-19: 3 [IU] via SUBCUTANEOUS
  Administered 2023-09-19: 2 [IU] via SUBCUTANEOUS
  Administered 2023-09-20: 3 [IU] via SUBCUTANEOUS
  Administered 2023-09-21: 2 [IU] via SUBCUTANEOUS

## 2023-09-17 MED ORDER — AMITRIPTYLINE HCL 10 MG PO TABS
10.0000 mg | ORAL_TABLET | Freq: Every evening | ORAL | Status: DC
  Administered 2023-09-17 – 2023-09-20 (×4): 10 mg via ORAL
  Filled 2023-09-17 (×5): qty 1

## 2023-09-17 NOTE — Assessment & Plan Note (Signed)
-   The patient will be placed on supplemental coverage with NovoLog. - We will continue Tradjenta. - We will continue [place].

## 2023-09-17 NOTE — Assessment & Plan Note (Signed)
-   We will continue antihypertensive therapy.

## 2023-09-17 NOTE — Plan of Care (Signed)

## 2023-09-17 NOTE — Assessment & Plan Note (Signed)
-   We will need to rule out sepsis and bacteremia. - The patient will be admitted to a medical telemetry bed. - We will continue broad-spectrum antibiotic therapy with IV cefepime, vancomycin and Flagyl. - We will continue hydration with IV lactated ringer. - We will follow blood cultures.

## 2023-09-17 NOTE — Progress Notes (Addendum)
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  Noted to have elevated transaminases.  Started on broad-spectrum antibiotics and undergoing workup for fever and transaminitis.  Assessment and Plan:  Fever of unknown origin Chest CT not suggestive of pneumonia. Urinalysis not suggestive of UTI. No focal symptoms. Abdominal ultrasound shows sludge in the gallbladder lumen with no secondary signs to suggest acute cholecystitis.  Patient has dilated common bile duct. -Continue broad-spectrum antibiotics: Cefepime, vancomycin, Flagyl. -Follow-up blood cultures.  Metastatic colon cancer Patient with known metastases to the liver and was scheduled for ablation.  - For outpatient follow up after acute illness.   Transaminitis, hyperbilirubinemia Likely due to biliary obstruction related to metastatic liver disease. RUQ ultrasound showed sludge in the gallbladder lumen with no secondary signs to suggest acute cholecystitis, dilated common bile duct, likely hepatic steatosis.  Known liver lesion not well-visualized. -Gastroenterology consult. - Will hold off MRCP for now, pending GI input.   Factor 5 Leiden mutation, heterozygous, with h/o DVT  The patient's wife stated that likely his ablation will need to be rescheduled and therefore we can continue Eliquis for now. - Continue Eliquis.   Essential hypertension - Continue antihypertensive therapy.  Dyslipidemia -Hold off statin therapy given elevated LFTs.  Type 2 diabetes mellitus with chronic kidney disease, without long-term current use of insulin (HCC) -Diabetic diet. -Continue Tradjenta. -Sliding  scale insulin.  GERD without esophagitis - Continue PPI therapy as well as Carafate.      Subjective: Patient states he is feeling much better.  He said he had chest tightness and back pain on presentation as well as vomiting and shivering.  He is now feeling better.  He denies headache, sore throat, cough, abdominal pain.  He had loose stools yesterday but this has resolved.  Physical Exam: Vitals:   09/17/23 0300 09/17/23 0400 09/17/23 0745 09/17/23 1216  BP: (!) 112/49 (!) 122/51    Pulse: (!) 50 (!) 56 (!) 56   Resp: 19 16 18    Temp:  98.2 F (36.8 C)  98.4 F (36.9 C)  TempSrc:  Oral    SpO2: 95% 98% 97%   Weight:      Height:        General: Alert, oriented X3. Hard of hearing. Eyes: Pupils equal, reactive  Oral cavity: moist mucous membranes  Head: Atraumatic, normocephalic  Neck: supple  Chest: clear to auscultation. No crackles, no wheezes  CVS: S1,S2 RRR. No murmurs  Abd: No distention, soft, non-tender. No masses palpable  Extr: No edema   MSK: No joint deformities or swelling  Neurological: Grossly intact.    Data Reviewed:     Latest Ref Rng & Units 09/17/2023    4:28 AM 09/16/2023   12:20 PM 09/07/2023    1:22 PM  CMP  Glucose 70 - 99 mg/dL 811  914  782   BUN 8 - 23 mg/dL 28  26  25    Creatinine 0.61 - 1.24 mg/dL 9.56  2.13  0.86   Sodium 135 - 145 mmol/L 137  140  139   Potassium 3.5 - 5.1 mmol/L 3.5  4.0  4.0  Chloride 98 - 111 mmol/L 107  107  107   CO2 22 - 32 mmol/L 22  22  22    Calcium 8.9 - 10.3 mg/dL 8.2  8.9  8.7   Total Protein 6.5 - 8.1 g/dL 5.5  6.7  6.7   Total Bilirubin 0.0 - 1.2 mg/dL 3.8  2.4  1.4   Alkaline Phos 38 - 126 U/L 207  198  125   AST 15 - 41 U/L 285  213  69   ALT 0 - 44 U/L 316  167  89       Latest Ref Rng & Units 09/17/2023    4:28 AM 09/16/2023   12:20 PM 09/07/2023    1:22 PM  CBC  WBC 4.0 - 10.5 K/uL 11.2  8.2  8.9   Hemoglobin 13.0 - 17.0 g/dL 86.5  78.4  69.6   Hematocrit 39.0 - 52.0 % 29.6  35.0  36.4    Platelets 150 - 400 K/uL 124  174  176      Family Communication: n/a  Disposition: Status is: Inpatient Remains inpatient appropriate because: Fever of unclear etiology on broad spectrum IV antibiotics and uptrending LFTs.   Planned Discharge Destination: Home    Time spent: 55 minutes  Author: Marcine Matar, MD 09/17/2023 1:12 PM  For on call review [email or web address].

## 2023-09-17 NOTE — Progress Notes (Addendum)
 Gastroenterology Consult   Referring Provider: Dr. Kirke Corin Primary Care Physician:  [name], MD Primary Gastroenterologist:  Dr. Sherlie Ban  Patient ID: Herbert Wood; [id number]; [birth year]   Admit date: 09/16/2023  LOS: 1 day   Date of Consultation: 09/17/2023  Reason for Consultation:  elevated LFTs  History of Present Illness   Herbert Wood is an 89 y.o. 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 GI consulted for further evaluation and discuss need for MRI/MRCP.   In the ED: WBC count normal, now up to 11.2 today, Tbili 2.4, indirect 1.4, direct 1.0, AST 213, ALT 167, Alk Phos 198. Blood culture with gram negative rods. Acute hepatitis panel negative. INR 1.2 yesterday, now 1.6. troponin negative. Hgb near baseline, 11.8. Started on cefepime and vanc.   CTA chest/abd/pelvis with 3 enlarging subcentimeter solid pulmonary nodules, concern for worsening metastatic disease. Mild interval increase in right hepatic lobe liver lesion. No intrahepatic or extrahepatic bile duct dilatation. Gallbladder distended with mild diffuse wall thickening.  US abdomen today: gallbladder sludge, dilated CBD 11 mm. Steatosis.   Today: notes GERD symptoms worsened from baseline.  Had been prescribed carafate in past but hasn't taken in some time. Chronic GERD. Omeprazole 40 mg daily. Started having chest tightness yesterday morning. Took 3 tums. Pain moved between shoulder blades. Significant back pain, chills, tmax 102.1. Went to the ED. Was weak. Couldn't stand up. Temp 98.4 today. Has chills today. Has some back pain but this is chronic. In therapy for back pain. No abdominal pain. Associated nausea with eating lunch  today.    Past Medical History:  Diagnosis Date   Adenomatous colon polyp    Anemia    Carotid bruit    Chronic dyspnea    Diabetes (HCC)    type 2   Diverticular disease    DVT (deep venous thrombosis) (HCC)    lower limb   Dyspnea    Factor V deficiency (HCC)    Hearing loss    bilateral   Hyperlipemia    Hypertension    Inguinal hernia    Kidney disease    stage 3   Lung nodule    Lymphadenopathy    Malignant neoplasm (HCC)    salivary gland   Murmur    Osteoarthritis    Senile purpura (HCC)     Past Surgical History:  Procedure Laterality Date   BIOPSY  01/10/2022   Procedure: BIOPSY;  Surgeon: Dolores Frame, MD;  Location: [facility];  Service: Gastroenterology;;   COLON SURGERY     COLONOSCOPY WITH PROPOFOL N/A 01/10/2022   Procedure: COLONOSCOPY WITH PROPOFOL;  Surgeon: Dolores Frame, MD;  Location: [facility];  Service: Gastroenterology;  Laterality: N/A;  per Soledad Gerlach, pt to arrive at 9:15   COLONOSCOPY WITH PROPOFOL N/A 02/27/2023   Procedure: COLONOSCOPY WITH PROPOFOL;  Surgeon: Dolores Frame, MD;  Location: [facility];  Service: Gastroenterology;  Laterality: N/A;  9:15am;asa 3   ESOPHAGOGASTRODUODENOSCOPY (EGD) WITH PROPOFOL N/A 01/10/2022   Procedure: ESOPHAGOGASTRODUODENOSCOPY (EGD) WITH PROPOFOL;  Surgeon: Dolores Frame, MD;  Location: [facility];  Service: Gastroenterology;  Laterality: N/A;   HAND / FINGER LESION EXCISION     HERNIA REPAIR  IR RADIOLOGIST EVAL & MGMT  04/04/2023   IR RADIOLOGIST EVAL & MGMT  08/09/2023   MASTOIDECTOMY     PILONIDAL CYST EXCISION     POLYPECTOMY  01/10/2022   Procedure: POLYPECTOMY;  Surgeon: Dolores Frame, MD;  Location: [facility];  Service: Gastroenterology;;   POLYPECTOMY  02/27/2023   Procedure: POLYPECTOMY;  Surgeon: Dolores Frame, MD;  Location: [facility];  Service: Gastroenterology;;   RADIOLOGY WITH ANESTHESIA  N/A 12/27/2022   Procedure: CT microwave ablation of the liver;  Surgeon: Irish Lack, MD;  Location: [facility];  Service: Radiology;  Laterality: N/A;   SUBMUCOSAL TATTOO INJECTION  01/10/2022   Procedure: SUBMUCOSAL TATTOO INJECTION;  Surgeon: Dolores Frame, MD;  Location: [facility];  Service: Gastroenterology;;   SURGERY OF LIP     skin cancer    Prior to Admission medications   Medication Sig Start Date End Date Taking? Authorizing Provider  ondansetron (ZOFRAN-ODT) 4 MG disintegrating tablet 4mg  ODT q4 hours prn nausea/vomit 09/16/23  Yes Bethann Berkshire, MD  albuterol (VENTOLIN HFA) 108 (90 Base) MCG/ACT inhaler Inhale into the lungs every 6 (six) hours as needed for wheezing or shortness of breath. Patient not taking: Reported on 09/04/2023    [provider]  amitriptyline (ELAVIL) 10 MG tablet Take 10 mg by mouth every evening.    [provider]  amLODipine (NORVASC) 5 MG tablet Take 5 mg by mouth 2 (two) times daily.    [provider]  ascorbic acid (VITAMIN C/NATURAL ROSE HIPS) 1000 MG tablet Take 1,000 mg by mouth every evening.    [provider]  atorvastatin (LIPITOR) 40 MG tablet Take 40 mg by mouth every evening.    [provider]  carvedilol (COREG) 6.25 MG tablet Take 6.25 mg by mouth 2 (two) times daily with a meal.    [provider]  Cholecalciferol (VITAMIN D3) 125 MCG (5000 UT) CAPS Take 5,000 Units by mouth every evening.    [provider]  dorzolamide (TRUSOPT) 2 % ophthalmic solution Place 1 drop into the left eye 2 (two) times daily.    [provider]  ELIQUIS 5 MG TABS tablet TAKE 1 TABLET(5 MG) BY MOUTH TWICE DAILY 06/05/23   [name], MD  ferrous sulfate 325 (65 FE) MG tablet Take 325 mg by mouth daily with breakfast.    [provider]  folic acid (FOLVITE) 800 MCG tablet Take 800 mcg by mouth every evening. With B12 Now (Brand)    [provider]  hydrALAZINE (APRESOLINE) 25 MG tablet Take 50 mg by mouth in the morning and at bedtime.    [provider]  HYDROcodone-acetaminophen (NORCO) 5-325 MG tablet Take 1 tablet by mouth every 8 (eight) hours as needed for moderate pain (pain score 4-6). 07/18/23   [name], MD  KERENDIA 10 MG TABS Take 10 mg by mouth daily.    [provider]  latanoprost (XALATAN) 0.005 % ophthalmic solution Place 1 drop into both eyes at bedtime.    [provider]  linagliptin (TRADJENTA) 5 MG TABS tablet Take 5 mg by mouth in the morning.    [provider]  loratadine (CLARITIN) 10 MG tablet Take 10 mg by mouth every morning.    [provider]  Misc Natural Products (PROSTATE SUPPORT PO) Take 1 capsule by mouth every evening. NOW Prostate Support    [provider]  omeprazole (PRILOSEC) 40 MG capsule Take 1 capsule (40  mg total) by mouth daily. 05/21/23   Carlan, Jeral Pinch, NP  predniSONE (DELTASONE) 50 MG tablet Take 1 tablet 13 hours prior to scan, 7 hours prior to scan and 1 hour prior to scan for contrast allergy Take 50 mg Benadryl 1 hour prior to scan 06/27/23   [name], MD  sucralfate (CARAFATE) 1 GM/10ML suspension Take 10 mLs (1 g total) by mouth 4 (four) times daily. Patient not taking: Reported on 09/04/2023 02/05/23   Raquel James, NP  traMADol (ULTRAM) 50 MG tablet Take 1 tablet (50 mg total) by mouth every 6 (six) hours as needed. 07/18/23   [name], MD  vitamin B-12 (CYANOCOBALAMIN) 1000 MCG tablet Take 1,000 mcg by mouth every evening. Now (Brand)    [provider]    Current Facility-Administered Medications  Medication Dose Route Frequency Provider Last Rate Last Admin   acetaminophen (TYLENOL) tablet 650 mg  650 mg Oral Q6H PRN Mansy, Jan A, MD       Or   acetaminophen (TYLENOL) suppository 650 mg  650 mg Rectal Q6H PRN Mansy, Jan A, MD       amLODipine (NORVASC) tablet 5 mg  5 mg Oral  BID Mansy, Jan A, MD   5 mg at 09/17/23 0912   apixaban (ELIQUIS) tablet 5 mg  5 mg Oral BID Mansy, Jan A, MD   5 mg at 09/17/23 1610   ascorbic acid (VITAMIN C) tablet 1,000 mg  1,000 mg Oral QPM Mansy, Jan A, MD   1,000 mg at 09/16/23 2329   carvedilol (COREG) tablet 6.25 mg  6.25 mg Oral BID WC Mansy, Jan A, MD   6.25 mg at 09/17/23 0758   ceFEPIme (MAXIPIME) 2 g in sodium chloride 0.9 % 100 mL IVPB  2 g Intravenous Q12H Mansy, Jan A, MD 200 mL/hr at 09/17/23 1048 2 g at 09/17/23 1048   cholecalciferol (VITAMIN D3) 25 MCG (1000 UNIT) tablet 5,000 Units  5,000 Units Oral QPM Mansy, Jan A, MD   5,000 Units at 09/16/23 2326   cyanocobalamin (VITAMIN B12) tablet 1,000 mcg  1,000 mcg Oral QPM Mansy, Jan A, MD   1,000 mcg at 09/16/23 2332   dorzolamide (TRUSOPT) 2 % ophthalmic solution 1 drop  1 drop Left Eye BID Mansy, Jan A, MD   1 drop at 09/17/23 0912   ferrous sulfate tablet 325 mg  325 mg Oral Q breakfast Mansy, Jan A, MD   325 mg at 09/17/23 0758   Finerenone TABS 10 mg  10 mg Oral Daily Mansy, Jan A, MD       folic acid (FOLVITE) tablet 1 mg  1 mg Oral QPM Mansy, Jan A, MD   1 mg at 09/16/23 2329   hydrALAZINE (APRESOLINE) tablet 50 mg  50 mg Oral BID Mansy, Jan A, MD   50 mg at 09/17/23 0912   insulin aspart (novoLOG) injection 0-15 Units  0-15 Units Subcutaneous TID WC Mdala-Gausi, Masiku Agatha, MD       latanoprost (XALATAN) 0.005 % ophthalmic solution 1 drop  1 drop Both Eyes QHS Mansy, Jan A, MD   1 drop at 09/17/23 0109   linagliptin (TRADJENTA) tablet 5 mg  5 mg Oral q AM Mansy, Jan A, MD   5 mg at 09/17/23 0622   loratadine (CLARITIN) tablet 10 mg  10 mg Oral q morning Mansy, Jan A, MD   10 mg at 09/17/23 0912   magnesium hydroxide (MILK OF MAGNESIA) suspension 30 mL  30 mL Oral Daily PRN Mansy, Jan A, MD       metroNIDAZOLE (FLAGYL) IVPB 500 mg  500 mg Intravenous Q12H Mansy, Jan A, MD 100 mL/hr at 09/17/23 0915 500 mg at 09/17/23 0915   ondansetron (ZOFRAN) tablet 4 mg  4 mg Oral  Q6H PRN Mansy, Jan A, MD       Or   ondansetron [facility]) injection 4 mg  4 mg Intravenous Q6H PRN Mansy, Jan A, MD       pantoprazole (PROTONIX) EC tablet 40 mg  40 mg Oral Daily Mansy, Jan A, MD   40 mg at 09/17/23 0912   traZODone (DESYREL) tablet 50 mg  50 mg Oral QHS PRN Mansy, Jan A, MD       vancomycin (VANCOREADY) IVPB 1250 mg/250 mL  1,250 mg Intravenous Q24H Mansy, Jan A, MD        Allergies as of 09/16/2023 - Review Complete 09/16/2023  Allergen Reaction Noted   Gadobenate Nausea And Vomiting 05/20/2015    History reviewed. No pertinent family history.  Social History   Socioeconomic History   Marital status: Married    Spouse name: Not on file   Number of children: Not on file   Years of education: Not on file   Highest education level: Not on file  Occupational History   Not on file  Tobacco Use   Smoking status: Former    Current packs/day: 0.00    Average packs/day: 2.0 packs/day for 25.0 years (50.0 ttl pk-yrs)    Types: Cigarettes    Start date: 81    Quit date: 60    Years since quitting: 30.1    Passive exposure: Past   Smokeless tobacco: Never  Vaping Use   Vaping status: Never Used  Substance and Sexual Activity   Alcohol use: Never   Drug use: Never   Sexual activity: Not on file  Other Topics Concern   Not on file  Social History Narrative   Not on file   Social Drivers of Health   Financial Resource Strain: Not on file  Food Insecurity: No Food Insecurity (09/16/2023)   Hunger Vital Sign    Worried About Running Out of Food in the Last Year: Never true    Ran Out of Food in the Last Year: Never true  Transportation Needs: No Transportation Needs (09/16/2023)   PRAPARE - Administrator, Civil Service (Medical): No    Lack of Transportation (Non-Medical): No  Physical Activity: Not on file  Stress: Not on file  Social Connections: Moderately Integrated (09/16/2023)   Social Connection and Isolation Panel [NHANES]     Frequency of Communication with Friends and Family: More than three times a week    Frequency of Social Gatherings with Friends and Family: Once a week    Attends Religious Services: More than 4 times per year    Active Member of [employer] or Organizations: No    Attends Banker Meetings: Never    Marital Status: Married  Catering manager Violence: Not At Risk (09/16/2023)   Humiliation, Afraid, Rape, and Kick questionnaire    Fear of Current or Ex-Partner: No    Emotionally Abused: No    Physically Abused: No    Sexually Abused: No     Review of Systems   Gen: see HPI CV: see HPI Resp: Denies shortness of breath with rest, cough, wheezing, coughing up blood, and pleurisy. GI: see HPI GU : Denies urinary burning,  blood in urine, urinary frequency, and urinary incontinence. MS: Denies joint pain, limitation of movement, swelling, cramps, and atrophy.  Derm: Denies rash, itching, dry skin, hives. Psych: Denies depression, anxiety, memory loss, hallucinations, and confusion. Heme: Denies bruising or bleeding Neuro:  Denies any headaches, dizziness, paresthesias, shaking  Physical Exam   Vital Signs in last 24 hours: Temp:  [98 F (36.7 C)-102.1 F (38.9 C)] 98.4 F (36.9 C) (02/17 1216) Pulse Rate:  [50-85] 56 (02/17 0745) Resp:  [16-19] 18 (02/17 0745) BP: (101-170)/(43-56) 122/51 (02/17 0400) SpO2:  [91 %-98 %] 97 % (02/17 0745) Last BM Date : 09/16/23  General:   Alert,  chronically ill-appearing. Skin very warm to touch Head:  Normocephalic and atraumatic. Eyes:  Sclera clear, no icterus.  Ears:  hard of hearing Mouth:  No deformity or lesions, dentition normal. Neck:  Supple; no masses Lungs:  Clear throughout to auscultation.   Heart:  S1 S2 present without obvious murmurs Abdomen:  Soft, larger AP diameter, nontender and nondistended. No masses, hepatosplenomegaly. Normal bowel sounds, without guarding, and without rebound.  Ventral hernia to right of  umbilicus.  Rectal: deferred   Msk:  Symmetrical without gross deformities. Normal posture. Extremities:  Without edema. Neurologic:  Alert and  oriented x4. Psych:  Alert and cooperative. Normal mood and affect.  Intake/Output from previous day: 02/16 0701 - 02/17 0700 In: 400 [IV Piggyback:400] Out: 400 [Urine:400] Intake/Output this shift: No intake/output data recorded.   Labs/Studies   Recent Labs Recent Labs    09/16/23 1220 09/17/23 0428  WBC 8.2 11.2*  HGB 11.8* 10.1*  HCT 35.0* 29.6*  PLT 174 124*   BMET Recent Labs    09/16/23 1220 09/17/23 0428  NA 140 137  K 4.0 3.5  CL 107 107  CO2 22 22  GLUCOSE 145* 107*  BUN 26* 28*  CREATININE 1.38* 1.38*  CALCIUM 8.9 8.2*   LFT Recent Labs    09/16/23 1220 09/17/23 0428  PROT 6.7 5.5*  ALBUMIN 3.6 3.0*  AST 213* 285*  ALT 167* 316*  ALKPHOS 198* 207*  BILITOT 2.4* 3.8*  BILIDIR 1.0*  --   IBILI 1.4*  --    PT/INR Recent Labs    09/17/23 0428  LABPROT 19.6*  INR 1.6*   Hepatitis Panel Recent Labs    09/17/23 0428  HEPBSAG NON REACTIVE  HCVAB NON REACTIVE  HEPAIGM NON REACTIVE  HEPBIGM NON REACTIVE   Radiology/Studies US Abdomen Limited RUQ (LIVER/GB) Result Date: 09/17/2023 CLINICAL DATA:  Elevated LFTs EXAM: ULTRASOUND ABDOMEN LIMITED RIGHT UPPER QUADRANT COMPARISON:  CT C [facility] 09/16/2023 FINDINGS: Gallbladder: Sludge in the gallbladder lumen. No gallbladder wall thickening or pericholecystic fluid. Negative sonographic Murphy's sign. Common bile duct: Diameter: 11 mm, dilated. Liver: Known liver lesion not well assessed on current exam. Increased parenchymal echogenicity. Portal vein is patent on color Doppler imaging with normal direction of blood flow towards the liver. Other: None. IMPRESSION: 1. Sludge in the gallbladder lumen. No secondary signs to suggest acute cholecystitis. 2. Dilated common bile duct. Recommend correlation with LFTs. If there is concern for biliary obstruction,  recommend MRI/MRCP. 3. Known liver lesion not well assessed on current exam. 4. Increased hepatic parenchymal echogenicity suggestive of steatosis. Electronically Signed   By: Annia Belt M.D.   On: 09/17/2023 12:56   CT Angio Chest/Abd/Pel for Dissection W and/or Wo Contrast Result Date: 09/16/2023 CLINICAL DATA:  Acute aortic syndrome (AAS) suspected. Mild chest tightening. Shortness of breath. History of metastatic  colon cancer. Liver ablation. * Tracking Code: BO * EXAM: CT ANGIOGRAPHY CHEST, ABDOMEN AND PELVIS TECHNIQUE: Non-contrast CT of the chest was initially obtained. Multidetector CT imaging through the chest, abdomen and pelvis was performed using the standard protocol during bolus administration of intravenous contrast. Multiplanar reconstructed images and MIPs were obtained and reviewed to evaluate the vascular anatomy. RADIATION DOSE REDUCTION: This exam was performed according to the departmental dose-optimization program which includes automated exposure control, adjustment of the mA and/or kV according to patient size and/or use of iterative reconstruction technique. CONTRAST:  OMNIPAQUE IOHEXOL 350 MG/ML SOLN COMPARISON:  CT scan chest from 07/02/2023 and PET-CT scan from 07/19/2023. FINDINGS: CTA CHEST FINDINGS Cardiovascular: No intramural hematoma noted in the thoracic aorta on the unenhanced images. Thoracic aorta is normal in caliber without aneurysm, dissection, vasculitis or significant stenosis. There is satisfactory opacification of bilateral pulmonary artery branches up to the segmental level. No filling defects noted to suggest embolism to the segmental artery level. Normal cardiac size. No pericardial effusion. No aortic aneurysm. There are coronary artery calcifications, in keeping with coronary artery disease. There are also moderate to severe peripheral atherosclerotic vascular calcifications of thoracic aorta and its major branches. Mediastinum/Nodes: Visualized thyroid  gland appears grossly unremarkable. No solid / cystic mediastinal masses. The esophagus is nondistended precluding optimal assessment. No axillary, mediastinal or hilar lymphadenopathy by size criteria. Lungs/Pleura: The central tracheo-bronchial tree is patent. There are 3, solid noncalcified nodules in the bilateral lungs (series 100, images 84, 120 and 135), which have increased in size in the prior study and compatible with metastases. The largest nodule is in the right lung lower lobe and measures 8.5 x 9.3 mm. There is an additional sub-4 mm noncalcified nodule in the right lung lower lobe (series 100, image 119), which is unchanged since several prior studies and favored benign in etiology. No mass or consolidation. No pleural effusion or pneumothorax. Musculoskeletal: The visualized soft tissues of the chest wall are grossly unremarkable. No suspicious osseous lesions. There are mild multilevel degenerative changes in the visualized spine. Review of the MIP images confirms the above findings. CTA ABDOMEN AND PELVIS FINDINGS VASCULAR Aorta: Normal caliber aorta without aneurysm, dissection, vasculitis or significant stenosis. Celiac: Patent without evidence of aneurysm, dissection, vasculitis or significant stenosis. SMA: Patent without evidence of aneurysm, dissection, vasculitis or significant stenosis. Renals: Both renal arteries are patent without evidence of aneurysm, dissection, vasculitis, fibromuscular dysplasia or significant stenosis. IMA: Patent without evidence of aneurysm, dissection, vasculitis or significant stenosis. Inflow: Patent without evidence of aneurysm, dissection, vasculitis or significant stenosis. Veins: No obvious venous abnormality within the limitations of this arterial phase study. Review of the MIP images confirms the above findings. NON-VASCULAR Hepatobiliary: The liver is normal in size. Non-cirrhotic configuration. Redemonstration of heterogeneous hypoattenuating mass in the  right hepatic lobe, segment 6 measuring 4.3 x 5.8 cm, compatible with metastasis. There is interval increase in the size of the lesion when compared to the prior PET-CT scan from 07/19/2023. No new suspicious liver lesions seen. No intrahepatic or extrahepatic bile duct dilation. Gallbladder is distended. There is mild diffuse wall thickening. No calcified gallstones noted. However, there is subtle pericholecystic fat stranding. Findings are equivocal for acute cholecystitis on the basis of CT scan. Correlate clinically and with liver function tests to determine the need for additional imaging. Pancreas: Unremarkable. No pancreatic ductal dilatation or surrounding inflammatory changes. Spleen: Within normal limits. No focal lesion. Adrenals/Urinary Tract: Adrenal glands are unremarkable. No suspicious renal mass.  Multiple bilateral simple renal cysts noted with largest arising from the right kidney measuring 4.2 x 5.8 cm. No nephroureterolithiasis or obstructive uropathy. Unremarkable urinary bladder. Stomach/Bowel: There is a small sliding hiatal hernia. Patient is status post right hemicolectomy with ileocolonic anastomosis in the right upper quadrant. No disproportionate dilation of the small or large bowel loops. No evidence of abnormal bowel wall thickening or inflammatory changes. There are multiple diverticula throughout the colon, without imaging signs of diverticulitis. Vascular/Lymphatic: No ascites or pneumoperitoneum. No abdominal or pelvic lymphadenopathy, by size criteria. No aneurysmal dilation of the major abdominal arteries. There are moderate peripheral atherosclerotic vascular calcifications of the aorta and its major branches. Reproductive: Enlarged prostate. Symmetric seminal vesicles. Other: There is a small right paramedian supraumbilical ventral hernia containing fat and portion of unobstructed transverse colon. There is also a small fat containing left inguinal hernia. The soft tissues and  abdominal wall are otherwise unremarkable. Musculoskeletal: No suspicious osseous lesions. There are mild - moderate multilevel degenerative changes in the visualized spine. Mild compression deformity of L3 vertebral body, similar to the prior study. Review of the MIP images confirms the above findings. IMPRESSION: 1. No acute aortic syndrome. No thoracic aortic acute intramural hematoma. No thoracoabdominal aortic aneurysm, dissection or penetrating atherosclerotic ulcer. There are moderate to severe peripheral atherosclerotic vascular calcifications of the major arteries of the chest, abdomen and pelvis. 2. There are 3 enlarging, subcentimeter, solid pulmonary nodules, suggesting worsening metastases. 3. Mild interval increase in the patient's known ablated right hepatic lobe liver lesion as well. No new liver lesion seen. 4. No other metastatic disease identified within the chest, abdomen or pelvis. 5. Multiple other nonacute observations, as described above. Aortic Atherosclerosis (ICD10-I70.0). Electronically Signed   By: Jules Schick M.D.   On: 09/16/2023 14:53   DG Chest Portable 1 View Result Date: 09/16/2023 CLINICAL DATA:  89 year old male with history of chest tightness. EXAM: PORTABLE CHEST 1 VIEW COMPARISON:  Chest x-ray 02/07/2023. FINDINGS: Chronic elevation of the right hemidiaphragm again noted. No acute consolidative airspace disease. No pleural effusions. No pneumothorax. No evidence of pulmonary edema. Heart size is upper limits of normal. Upper mediastinal contours are within normal limits. Atherosclerotic calcifications are noted in the thoracic aorta. IMPRESSION: 1. No radiographic evidence of acute cardiopulmonary disease. 2. Aortic atherosclerosis. Electronically Signed   By: Trudie Reed M.D.   On: 09/16/2023 13:00     Assessment   Alexiz Sustaita is a 89 y.o. 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 GI consulted for further evaluation and discuss need for MRI/MRCP.   Imaging reviewed and appears to have CBD dilation on [place] with worsening transaminases and increased bilirubin (primarily indirect previously), with concern for possible mass effect causing obstruction vs sludge/microlithiasis. Blood cultures thus far with gram negative rods and WBC slightly increased today. With fever and overall presentation, concerning for cholangitis and needs MRI/MRCP to further evaluate to help guide care. If ERCP needed, will need to be transferred to [facility] or quaternary facility. Past medical history is complicated and on chronic anticoagulation.   Plan / Recommendations    MRI/MRCP Increase PPI to BID Will add carafate as was on this as outpatient Continue antibiotics (cefepime and vanc) Further recommendations  following MRI/MRCP     09/17/2023, 1:57 PM  Herbert Mink, PhD, [facility]

## 2023-09-17 NOTE — Assessment & Plan Note (Addendum)
-   This could be related to liver metastasis. - Will follow LFTs while holding off statin therapy. - We will avoid hepatotoxins. - Will obtain a right upper quadrant ultrasound.

## 2023-09-17 NOTE — Plan of Care (Signed)

## 2023-09-17 NOTE — Assessment & Plan Note (Signed)
-   Continue PPI therapy as well as Carafate.

## 2023-09-17 NOTE — TOC Initial Note (Signed)
Transition of Care [facility]) - Initial/Assessment Note    Patient Details  Name: Herbert Wood MRN: [id number] Date of Birth: 13-Aug-1934  [facility]) CM/SW Contact:    Villa Herb, LCSWA Phone Number: 09/17/2023, 11:58 AM  Clinical Narrative:                 Pt is high risk for readmission. CSW spoke with pts wife to complete assessment. Pt lives with her spouse. Pt is independent in completing his ADLs. Pt is able to drive to appointments when needed. Pt has not had HH in the past but has gone to outpatient PT when needed. Pt does not use any DME when needed. TOC to follow.   Expected Discharge Plan: Home/Self Care Barriers to Discharge: Continued Medical Work up   Patient Goals and CMS Choice Patient states their goals for this hospitalization and ongoing recovery are:: return home CMS Medicare.gov Compare Post Acute Care list provided to:: Patient Choice offered to / list presented to : Patient, Spouse      Expected Discharge Plan and Services In-house Referral: Clinical Social Work Discharge Planning Services: CM Consult   Living arrangements for the past 2 months: Single Family Home                                      Prior Living Arrangements/Services Living arrangements for the past 2 months: Single Family Home Lives with:: Spouse Patient language and need for interpreter reviewed:: Yes Do you feel safe going back to the place where you live?: Yes      Need for Family Participation in Patient Care: Yes (Comment) Care giver support system in place?: Yes (comment)   Criminal Activity/Legal Involvement Pertinent to Current Situation/Hospitalization: No - Comment as needed  Activities of Daily Living   ADL Screening (condition at time of admission) Independently performs ADLs?: Yes (appropriate for developmental age) Is the patient deaf or have difficulty hearing?: Yes Does the patient have difficulty seeing, even when wearing glasses/contacts?:  No Does the patient have difficulty concentrating, remembering, or making decisions?: No  Permission Sought/Granted                  Emotional Assessment Appearance:: Appears stated age Attitude/Demeanor/Rapport: Engaged Affect (typically observed): Accepting   Alcohol / Substance Use: Not Applicable Psych Involvement: No (comment)  Admission diagnosis:  Fever of unknown origin [R50.9] Atypical chest pain [R07.89] Patient Active Problem List   Diagnosis Date Noted   Essential hypertension 09/17/2023   Dyslipidemia 09/17/2023   Elevated LFTs 09/17/2023   Factor 5 Leiden mutation, heterozygous (HCC) 09/17/2023   GERD without esophagitis 09/17/2023   Type 2 diabetes mellitus with chronic kidney disease, without long-term current use of insulin (HCC) 09/17/2023   Fever of unknown origin 09/16/2023   History of colon cancer 02/27/2023   Acute kidney injury superimposed on chronic kidney disease (HCC) 02/07/2023   Fall 02/07/2023   Gastroesophageal reflux disease 02/05/2023   Abdominal pain, left upper quadrant 02/05/2023   Nausea and vomiting 02/05/2023   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) 12/27/2022   S/P left hemicolectomy 02/17/2022   Cancer of sigmoid colon (HCC) 01/25/2022   NSAID long-term use    Diverticulitis 11/01/2021   HTN (hypertension) 11/01/2021    Class: Chronic   HLD (hyperlipidemia) 11/01/2021    Class: Chronic   Hard of hearing 11/01/2021    Class: Chronic  Leukocytosis 11/01/2021    Class: Acute   Factor V Leiden (HCC) 11/01/2021    Class: Chronic   History of DVT (deep vein thrombosis) 11/01/2021   DM (diabetes mellitus), type 2 (HCC) 11/01/2021    Class: Chronic   Chronic kidney disease (CKD), stage III (moderate) (HCC) 11/01/2021    Class: Chronic   Peripheral neuropathy 11/01/2021   Colonic diverticular abscess 11/01/2021    Class: Acute   Abnormal CT scan, colon 10/31/2021   Iron deficiency anemia 10/31/2021   PCP:  [name], MD Pharmacy:   [facility] [phone] [place], [place] - [address] AT [facility] [address] [place] [address] Phone: [phone] Fax: [phone]     Social Drivers of Health (SDOH) Social History: SDOH Screenings   Food Insecurity: No Food Insecurity (09/16/2023)  Housing: Low Risk  (09/16/2023)  Transportation Needs: No Transportation Needs (09/16/2023)  Utilities: Not At Risk (09/16/2023)  Social Connections: Moderately Integrated (09/16/2023)  Tobacco Use: Medium Risk (09/16/2023)   SDOH Interventions:     Readmission Risk Interventions    09/17/2023   11:57 AM 02/18/2022   11:07 AM  Readmission Risk Prevention Plan  Transportation Screening Complete Complete  HRI or Home Care Consult Complete Complete  Social Work Consult for Recovery Care Planning/Counseling Complete Complete  Palliative Care Screening Not Applicable Not Applicable  Medication Review Oceanographer) Complete

## 2023-09-17 NOTE — Assessment & Plan Note (Signed)
-   We wil hold off statin therapy given elevated LFTs.

## 2023-09-17 NOTE — Assessment & Plan Note (Signed)
-   We will continue Eliquis for now. - The patient's wife stated that likely he is ablation will need to be rescheduled and therefore we can continue Eliquis for now.

## 2023-09-18 ENCOUNTER — Inpatient Hospital Stay (HOSPITAL_COMMUNITY): Payer: Medicare Other

## 2023-09-18 DIAGNOSIS — R7989 Other specified abnormal findings of blood chemistry: Secondary | ICD-10-CM | POA: Diagnosis not present

## 2023-09-18 DIAGNOSIS — C787 Secondary malignant neoplasm of liver and intrahepatic bile duct: Secondary | ICD-10-CM | POA: Diagnosis not present

## 2023-09-18 DIAGNOSIS — K8050 Calculus of bile duct without cholangitis or cholecystitis without obstruction: Secondary | ICD-10-CM | POA: Diagnosis not present

## 2023-09-18 DIAGNOSIS — K828 Other specified diseases of gallbladder: Secondary | ICD-10-CM | POA: Diagnosis not present

## 2023-09-18 DIAGNOSIS — R7881 Bacteremia: Secondary | ICD-10-CM | POA: Diagnosis not present

## 2023-09-18 DIAGNOSIS — K8309 Other cholangitis: Secondary | ICD-10-CM | POA: Diagnosis present

## 2023-09-18 DIAGNOSIS — B961 Klebsiella pneumoniae [K. pneumoniae] as the cause of diseases classified elsewhere: Secondary | ICD-10-CM

## 2023-09-18 LAB — COMPREHENSIVE METABOLIC PANEL
ALT: 192 U/L — ABNORMAL HIGH (ref 0–44)
AST: 120 U/L — ABNORMAL HIGH (ref 15–41)
Albumin: 2.7 g/dL — ABNORMAL LOW (ref 3.5–5.0)
Alkaline Phosphatase: 187 U/L — ABNORMAL HIGH (ref 38–126)
Anion gap: 10 (ref 5–15)
BUN: 30 mg/dL — ABNORMAL HIGH (ref 8–23)
CO2: 19 mmol/L — ABNORMAL LOW (ref 22–32)
Calcium: 8.1 mg/dL — ABNORMAL LOW (ref 8.9–10.3)
Chloride: 105 mmol/L (ref 98–111)
Creatinine, Ser: 1.54 mg/dL — ABNORMAL HIGH (ref 0.61–1.24)
GFR, Estimated: 44 mL/min — ABNORMAL LOW (ref >60)
Glucose, Bld: 108 mg/dL — ABNORMAL HIGH (ref 70–99)
Potassium: 3.3 mmol/L — ABNORMAL LOW (ref 3.5–5.1)
Sodium: 134 mmol/L — ABNORMAL LOW (ref 135–145)
Total Bilirubin: 3.4 mg/dL — ABNORMAL HIGH (ref 0.0–1.2)
Total Protein: 5.4 g/dL — ABNORMAL LOW (ref 6.5–8.1)

## 2023-09-18 LAB — CBC WITH DIFFERENTIAL/PLATELET
Abs Immature Granulocytes: 0.03 10*3/uL (ref 0.00–0.07)
Basophils Absolute: 0 10*3/uL (ref 0.0–0.1)
Basophils Relative: 0 %
Eosinophils Absolute: 0.1 10*3/uL (ref 0.0–0.5)
Eosinophils Relative: 1 %
HCT: 28.8 % — ABNORMAL LOW (ref 39.0–52.0)
Hemoglobin: 9.8 g/dL — ABNORMAL LOW (ref 13.0–17.0)
Immature Granulocytes: 1 %
Lymphocytes Relative: 8 %
Lymphs Abs: 0.5 10*3/uL — ABNORMAL LOW (ref 0.7–4.0)
MCH: 33.7 pg (ref 26.0–34.0)
MCHC: 34 g/dL (ref 30.0–36.0)
MCV: 99 fL (ref 80.0–100.0)
Monocytes Absolute: 0.6 10*3/uL (ref 0.1–1.0)
Monocytes Relative: 9 %
Neutro Abs: 5.2 10*3/uL (ref 1.7–7.7)
Neutrophils Relative %: 81 %
Platelets: 109 10*3/uL — ABNORMAL LOW (ref 150–400)
RBC: 2.91 MIL/uL — ABNORMAL LOW (ref 4.22–5.81)
RDW: 13.3 % (ref 11.5–15.5)
WBC: 6.5 10*3/uL (ref 4.0–10.5)
nRBC: 0 % (ref 0.0–0.2)

## 2023-09-18 LAB — GLUCOSE, CAPILLARY
Glucose-Capillary: 106 mg/dL — ABNORMAL HIGH (ref 70–99)
Glucose-Capillary: 135 mg/dL — ABNORMAL HIGH (ref 70–99)
Glucose-Capillary: 106 mg/dL — ABNORMAL HIGH (ref 70–99)
Glucose-Capillary: 128 mg/dL — ABNORMAL HIGH (ref 70–99)

## 2023-09-18 LAB — PROCALCITONIN: Procalcitonin: 8.35 ng/mL

## 2023-09-18 MED ORDER — SODIUM CHLORIDE 0.9 % IV SOLN
2.0000 g | INTRAVENOUS | Status: DC
  Administered 2023-09-18 – 2023-09-19 (×2): 2 g via INTRAVENOUS
  Filled 2023-09-18: qty 20

## 2023-09-18 MED ORDER — POTASSIUM CHLORIDE CRYS ER 20 MEQ PO TBCR
40.0000 meq | EXTENDED_RELEASE_TABLET | Freq: Two times a day (BID) | ORAL | Status: AC
  Administered 2023-09-18 – 2023-09-19 (×2): 40 meq via ORAL
  Filled 2023-09-18 (×2): qty 2

## 2023-09-18 MED ORDER — PANTOPRAZOLE SODIUM 40 MG PO TBEC
40.0000 mg | DELAYED_RELEASE_TABLET | Freq: Two times a day (BID) | ORAL | Status: DC
  Administered 2023-09-18 – 2023-09-21 (×6): 40 mg via ORAL
  Filled 2023-09-18 (×6): qty 1

## 2023-09-18 NOTE — Progress Notes (Addendum)
Gastroenterology Progress Note    Primary Gastroenterologist:  Dr. [name]   Patient ID: Herbert Wood; [id number]; 01/01/1935    Subjective   Nausea this morning after MRI. Vomited up the few bites he had eaten. Afebrile. No chest pain. Nausea resolved now. Feels he may have been nauseated from walking around and not eating. Belching/GERD improved. No further chills.    Objective   Vital signs in last 24 hours Temp:  [97.4 F (36.3 C)-98.8 F (37.1 C)] 98.2 F (36.8 C) (02/18 0811) Pulse Rate:  [58-65] 64 (02/18 0811) Resp:  [18-22] 18 (02/18 0811) BP: (97-136)/(48-60) 136/60 (02/18 0811) SpO2:  [97 %] 97 % (02/18 0811) Last BM Date : 09/16/23  Physical Exam General:   Alert and oriented, pleasant, sallow-appearing, mild scleral icterus Abdomen:  Bowel sounds present, soft, non-tender, non-distended. Ventral hernia to left of umbilicus Extremities:  Without edema. Neurologic:  Alert and  oriented x4  Intake/Output from previous day: 02/17 0701 - 02/18 0700 In: 650 [IV Piggyback:650] Out: -  Intake/Output this shift: No intake/output data recorded.  Lab Results  Recent Labs    09/16/23 1220 09/17/23 0428 09/18/23 0400  WBC 8.2 11.2* 6.5  HGB 11.8* 10.1* 9.8*  HCT 35.0* 29.6* 28.8*  PLT 174 124* 109*   BMET Recent Labs    09/16/23 1220 09/17/23 0428 09/18/23 0400  NA 140 137 134*  K 4.0 3.5 3.3*  CL 107 107 105  CO2 22 22 19*  GLUCOSE 145* 107* 108*  BUN 26* 28* 30*  CREATININE 1.38* 1.38* 1.54*  CALCIUM 8.9 8.2* 8.1*   LFT Recent Labs    09/16/23 1220 09/17/23 0428 09/18/23 0400  PROT 6.7 5.5* 5.4*  ALBUMIN 3.6 3.0* 2.7*  AST 213* 285* 120*  ALT 167* 316* 192*  ALKPHOS 198* 207* 187*  BILITOT 2.4* 3.8* 3.4*  BILIDIR 1.0*  --   --   IBILI 1.4*  --   --    PT/INR Recent Labs    09/17/23 0428  LABPROT 19.6*  INR 1.6*   Hepatitis Panel Recent Labs    09/17/23 0428  HEPBSAG NON REACTIVE  HCVAB NON REACTIVE  HEPAIGM NON  REACTIVE  HEPBIGM NON REACTIVE     Studies/Results MR ABDOMEN MRCP WO CONTRAST Result Date: 09/18/2023 CLINICAL DATA:  Biliary obstruction suspected metastatic colon cancer EXAM: MRI ABDOMEN WITHOUT CONTRAST  (INCLUDING MRCP) TECHNIQUE: Multiplanar multisequence MR imaging of the abdomen was performed. Heavily T2-weighted images of the biliary and pancreatic ducts were obtained, and three-dimensional MRCP images were rendered by post processing. COMPARISON:  Right upper quadrant ultrasound, 09/17/2023 CT chest angiogram, 09/16/2023, PET-CT, 07/28/2023 FINDINGS: Lower chest: Trace pleural effusions. Hepatobiliary: Metastasis of the inferior right lobe of the liver, hepatic segment VI (series 3, image 23). Sludge in the gallbladder. Mild gallbladder wall thickening. Adjacent gallstones in the central common bile duct measuring 1.0 and 0.7 cm (series 4, image 15). Common bile duct measures up to 1.1 cm in caliber within the pancreatic head with mild intrahepatic biliary ductal dilatation. Pancreas: Unremarkable. No pancreatic ductal dilatation or surrounding inflammatory changes. Spleen: Normal in size without significant abnormality. Adrenals/Urinary Tract: Adrenal glands are unremarkable. Numerous bilateral renal cortical and parapelvic cysts, some of which are septated, requiring no specific further follow-up or characterization. No calculi or hydronephrosis. Stomach/Bowel: Stomach is within normal limits. No evidence of bowel wall thickening, distention, or inflammatory changes. Vascular/Lymphatic: No significant vascular findings are present. No enlarged abdominal lymph nodes. Other: No abdominal wall hernia.  Mild anasarca.  Trace ascites. Musculoskeletal: No acute or significant osseous findings. IMPRESSION: 1. Choledocholithiasis with adjacent gallstones in the central common bile duct measuring 1.0 and 0.7 cm. Common bile duct measures up to 1.1 cm in caliber within the pancreatic head with mild  intrahepatic biliary ductal dilatation. 2. Sludge in the gallbladder. Mild gallbladder wall thickening. 3. Known metastasis of the inferior right lobe of the liver, hepatic segment VI. 4. Trace pleural effusions, trace ascites, and mild anasarca. These results will be called to the ordering clinician or representative by the Radiologist Assistant, and communication documented in the PACS or [employer]. Electronically Signed   By: Jearld Lesch M.D.   On: 09/18/2023 11:03   MR 3D Recon At Scanner Result Date: 09/18/2023 CLINICAL DATA:  Biliary obstruction suspected metastatic colon cancer EXAM: MRI ABDOMEN WITHOUT CONTRAST  (INCLUDING MRCP) TECHNIQUE: Multiplanar multisequence MR imaging of the abdomen was performed. Heavily T2-weighted images of the biliary and pancreatic ducts were obtained, and three-dimensional MRCP images were rendered by post processing. COMPARISON:  Right upper quadrant ultrasound, 09/17/2023 CT chest angiogram, 09/16/2023, PET-CT, 07/28/2023 FINDINGS: Lower chest: Trace pleural effusions. Hepatobiliary: Metastasis of the inferior right lobe of the liver, hepatic segment VI (series 3, image 23). Sludge in the gallbladder. Mild gallbladder wall thickening. Adjacent gallstones in the central common bile duct measuring 1.0 and 0.7 cm (series 4, image 15). Common bile duct measures up to 1.1 cm in caliber within the pancreatic head with mild intrahepatic biliary ductal dilatation. Pancreas: Unremarkable. No pancreatic ductal dilatation or surrounding inflammatory changes. Spleen: Normal in size without significant abnormality. Adrenals/Urinary Tract: Adrenal glands are unremarkable. Numerous bilateral renal cortical and parapelvic cysts, some of which are septated, requiring no specific further follow-up or characterization. No calculi or hydronephrosis. Stomach/Bowel: Stomach is within normal limits. No evidence of bowel wall thickening, distention, or inflammatory changes.  Vascular/Lymphatic: No significant vascular findings are present. No enlarged abdominal lymph nodes. Other: No abdominal wall hernia.  Mild anasarca.  Trace ascites. Musculoskeletal: No acute or significant osseous findings. IMPRESSION: 1. Choledocholithiasis with adjacent gallstones in the central common bile duct measuring 1.0 and 0.7 cm. Common bile duct measures up to 1.1 cm in caliber within the pancreatic head with mild intrahepatic biliary ductal dilatation. 2. Sludge in the gallbladder. Mild gallbladder wall thickening. 3. Known metastasis of the inferior right lobe of the liver, hepatic segment VI. 4. Trace pleural effusions, trace ascites, and mild anasarca. These results will be called to the ordering clinician or representative by the Radiologist Assistant, and communication documented in the PACS or [employer]. Electronically Signed   By: Jearld Lesch M.D.   On: 09/18/2023 11:03   US Abdomen Limited RUQ (LIVER/GB) Result Date: 09/17/2023 CLINICAL DATA:  Elevated LFTs EXAM: ULTRASOUND ABDOMEN LIMITED RIGHT UPPER QUADRANT COMPARISON:  CT C AP 09/16/2023 FINDINGS: Gallbladder: Sludge in the gallbladder lumen. No gallbladder wall thickening or pericholecystic fluid. Negative sonographic Murphy's sign. Common bile duct: Diameter: 11 mm, dilated. Liver: Known liver lesion not well assessed on current exam. Increased parenchymal echogenicity. Portal vein is patent on color Doppler imaging with normal direction of blood flow towards the liver. Other: None. IMPRESSION: 1. Sludge in the gallbladder lumen. No secondary signs to suggest acute cholecystitis. 2. Dilated common bile duct. Recommend correlation with LFTs. If there is concern for biliary obstruction, recommend MRI/MRCP. 3. Known liver lesion not well assessed on current exam. 4. Increased hepatic parenchymal echogenicity suggestive of steatosis. Electronically Signed   By: Kenard Gower  Earlene Plater M.D.   On: 09/17/2023 12:56   CT Angio Chest/Abd/Pel for  Dissection W and/or Wo Contrast Result Date: 09/16/2023 CLINICAL DATA:  Acute aortic syndrome (AAS) suspected. Mild chest tightening. Shortness of breath. History of metastatic colon cancer. Liver ablation. * Tracking Code: BO * EXAM: CT ANGIOGRAPHY CHEST, ABDOMEN AND PELVIS TECHNIQUE: Non-contrast CT of the chest was initially obtained. Multidetector CT imaging through the chest, abdomen and pelvis was performed using the standard protocol during bolus administration of intravenous contrast. Multiplanar reconstructed images and MIPs were obtained and reviewed to evaluate the vascular anatomy. RADIATION DOSE REDUCTION: This exam was performed according to the departmental dose-optimization program which includes automated exposure control, adjustment of the mA and/or kV according to patient size and/or use of iterative reconstruction technique. CONTRAST:  OMNIPAQUE IOHEXOL 350 MG/ML SOLN COMPARISON:  CT scan chest from 07/02/2023 and PET-CT scan from 07/19/2023. FINDINGS: CTA CHEST FINDINGS Cardiovascular: No intramural hematoma noted in the thoracic aorta on the unenhanced images. Thoracic aorta is normal in caliber without aneurysm, dissection, vasculitis or significant stenosis. There is satisfactory opacification of bilateral pulmonary artery branches up to the segmental level. No filling defects noted to suggest embolism to the segmental artery level. Normal cardiac size. No pericardial effusion. No aortic aneurysm. There are coronary artery calcifications, in keeping with coronary artery disease. There are also moderate to severe peripheral atherosclerotic vascular calcifications of thoracic aorta and its major branches. Mediastinum/Nodes: Visualized thyroid gland appears grossly unremarkable. No solid / cystic mediastinal masses. The esophagus is nondistended precluding optimal assessment. No axillary, mediastinal or hilar lymphadenopathy by size criteria. Lungs/Pleura: The central tracheo-bronchial  tree is patent. There are 3, solid noncalcified nodules in the bilateral lungs (series 100, images 84, 120 and 135), which have increased in size in the prior study and compatible with metastases. The largest nodule is in the right lung lower lobe and measures 8.5 x 9.3 mm. There is an additional sub-4 mm noncalcified nodule in the right lung lower lobe (series 100, image 119), which is unchanged since several prior studies and favored benign in etiology. No mass or consolidation. No pleural effusion or pneumothorax. Musculoskeletal: The visualized soft tissues of the chest wall are grossly unremarkable. No suspicious osseous lesions. There are mild multilevel degenerative changes in the visualized spine. Review of the MIP images confirms the above findings. CTA ABDOMEN AND PELVIS FINDINGS VASCULAR Aorta: Normal caliber aorta without aneurysm, dissection, vasculitis or significant stenosis. Celiac: Patent without evidence of aneurysm, dissection, vasculitis or significant stenosis. SMA: Patent without evidence of aneurysm, dissection, vasculitis or significant stenosis. Renals: Both renal arteries are patent without evidence of aneurysm, dissection, vasculitis, fibromuscular dysplasia or significant stenosis. IMA: Patent without evidence of aneurysm, dissection, vasculitis or significant stenosis. Inflow: Patent without evidence of aneurysm, dissection, vasculitis or significant stenosis. Veins: No obvious venous abnormality within the limitations of this arterial phase study. Review of the MIP images confirms the above findings. NON-VASCULAR Hepatobiliary: The liver is normal in size. Non-cirrhotic configuration. Redemonstration of heterogeneous hypoattenuating mass in the right hepatic lobe, segment 6 measuring 4.3 x 5.8 cm, compatible with metastasis. There is interval increase in the size of the lesion when compared to the prior PET-CT scan from 07/19/2023. No new suspicious liver lesions seen. No intrahepatic  or extrahepatic bile duct dilation. Gallbladder is distended. There is mild diffuse wall thickening. No calcified gallstones noted. However, there is subtle pericholecystic fat stranding. Findings are equivocal for acute cholecystitis on the basis of CT scan. Correlate clinically  and with liver function tests to determine the need for additional imaging. Pancreas: Unremarkable. No pancreatic ductal dilatation or surrounding inflammatory changes. Spleen: Within normal limits. No focal lesion. Adrenals/Urinary Tract: Adrenal glands are unremarkable. No suspicious renal mass. Multiple bilateral simple renal cysts noted with largest arising from the right kidney measuring 4.2 x 5.8 cm. No nephroureterolithiasis or obstructive uropathy. Unremarkable urinary bladder. Stomach/Bowel: There is a small sliding hiatal hernia. Patient is status post right hemicolectomy with ileocolonic anastomosis in the right upper quadrant. No disproportionate dilation of the small or large bowel loops. No evidence of abnormal bowel wall thickening or inflammatory changes. There are multiple diverticula throughout the colon, without imaging signs of diverticulitis. Vascular/Lymphatic: No ascites or pneumoperitoneum. No abdominal or pelvic lymphadenopathy, by size criteria. No aneurysmal dilation of the major abdominal arteries. There are moderate peripheral atherosclerotic vascular calcifications of the aorta and its major branches. Reproductive: Enlarged prostate. Symmetric seminal vesicles. Other: There is a small right paramedian supraumbilical ventral hernia containing fat and portion of unobstructed transverse colon. There is also a small fat containing left inguinal hernia. The soft tissues and abdominal wall are otherwise unremarkable. Musculoskeletal: No suspicious osseous lesions. There are mild - moderate multilevel degenerative changes in the visualized spine. Mild compression deformity of L3 vertebral body, similar to the prior  study. Review of the MIP images confirms the above findings. IMPRESSION: 1. No acute aortic syndrome. No thoracic aortic acute intramural hematoma. No thoracoabdominal aortic aneurysm, dissection or penetrating atherosclerotic ulcer. There are moderate to severe peripheral atherosclerotic vascular calcifications of the major arteries of the chest, abdomen and pelvis. 2. There are 3 enlarging, subcentimeter, solid pulmonary nodules, suggesting worsening metastases. 3. Mild interval increase in the patient's known ablated right hepatic lobe liver lesion as well. No new liver lesion seen. 4. No other metastatic disease identified within the chest, abdomen or pelvis. 5. Multiple other nonacute observations, as described above. Aortic Atherosclerosis (ICD10-I70.0). Electronically Signed   By: Jules Schick M.D.   On: 09/16/2023 14:53   DG Chest Portable 1 View Result Date: 09/16/2023 CLINICAL DATA:  89 year old male with history of chest tightness. EXAM: PORTABLE CHEST 1 VIEW COMPARISON:  Chest x-ray 02/07/2023. FINDINGS: Chronic elevation of the right hemidiaphragm again noted. No acute consolidative airspace disease. No pleural effusions. No pneumothorax. No evidence of pulmonary edema. Heart size is upper limits of normal. Upper mediastinal contours are within normal limits. Atherosclerotic calcifications are noted in the thoracic aorta. IMPRESSION: 1. No radiographic evidence of acute cardiopulmonary disease. 2. Aortic atherosclerosis. Electronically Signed   By: Trudie Reed M.D.   On: 09/16/2023 13:00    Assessment  89 y.o. 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 Admitted with sepsis  and concerns  for cholangitis.  MRCP today with choledocholithiasis, CBD measuring up to 1.1 cm, gallbladder sludge and mild gallbladder wall thickening, known metastasis of inferior right lobe of liver. Additional findings as above. His transaminases are improving from yesterday (AST 192, previously 316, ALT 192 previously 316), Tbili 3.4 today, down from 3.8 yesterday. Clinically, he has had resolution of fever/chills and chest pain. Receiving IV antibiotics, changed to Rocephin today. Positive blood cultures for enterobacterales and Klebsiella pneumoniae.   Will reach  out to [facility] in [place] regarding ERCP. May need quaternary facility in light of multi-morbidities. Notably, he is on chronic anticoagulation (Eliquis).    Plan / Recommendations  Continue IV antibiotics Reaching out to [facility] regarding ERCP Continue PPI BID Further recommendations to follow.     LOS: 2 days    09/18/2023, 11:26 AM  Gelene Mink, PhD, ANP-BC [facility]   Addendum: [facility] recommending to transfer to [facility]. Discussed with Noel Gerold, NP, and Dr. Chales Abrahams, GI on call today at [facility]. Patient will be transferred to [facility] and GI will be consulted once patient has arrived. Eliquis last dose this morning. Discontinued Eliquis in preparation for the procedure. If prolonged time of waiting for ERCP, recommend starting Lovenox 1mg /kg BID tomorrow morning, which can be held evening prior ERCP. Gelene Mink, PhD, ANP-BC [facility]

## 2023-09-18 NOTE — Progress Notes (Addendum)
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  Noted to have elevated transaminases.  Started on broad-spectrum antibiotics and undergoing workup for fever and transaminitis.  2/18: BxCx positive for klebsiella. Started on ceftriaxone. Remains afebrile.  MRCP positive for choledocholithiasis. Will need transfer [facility] for ER[facility].Liver enzymes improving. [facility] transfer initiated.   Assessment and Plan:  Klebsiella bacteremia- BcCx positive. Remains afebrile - transitioned to CTX  Choledocholithiasis- as seen on MRCP today. Denies abdominal pain. Remains jaundiced most noticeable on LEs. - GI consulted and recommended transfer to [facility] for ERCP - transfer initiated - analgesia PRN - last eliquis dose today followed by lovenox for ppx pre procedure  Metastatic colon cancer- CBD measuring up to 1.1 cm, gallbladder sludge and mild gallbladder wall thickening, known metastasis of inferior right lobe of liver. transaminases are improving from yesterday (AST 192, previously 316, ALT 192 previously 316), Tbili 3.4 today, down from 3.8 yesterday. on.  - For outpatient follow up after acute illness.  - monitoring CMP  Factor 5 Leiden mutation, heterozygous, with h/o DVT  The patient's wife stated that likely his ablation will need to be rescheduled and therefore we can continue Eliquis for now. - Continue Eliquis today and change to lovenox prior to procedure planned this week  Essential hypertension - Continue antihypertensive therapy.  Dyslipidemia -Hold off statin therapy given elevated LFTs.  Type 2 diabetes mellitus with chronic kidney disease, without long-term  current use of insulin (HCC) -Diabetic diet. -Continue Tradjenta. -Sliding scale insulin.  GERD without esophagitis - Continue PPI therapy as well as Carafate.      Subjective: Patient states he feels improved. Was not able to tolerate much for breakfast. The abdominal pain has improved. Wife at bedside was under the impression they could go home today since she wanted to be home before the snow starts. I informed her of the blood culture findings.   Physical Exam: Vitals:   09/17/23 1939 09/17/23 2055 09/17/23 2056 09/18/23 0320  BP: (!) 125/58 (!) 125/58 (!) 125/58 (!) 97/48  Pulse: 65   (!) 58  Resp: (!) 22     Temp: (!) 97.4 F (36.3 C)   97.8 F (36.6 C)  TempSrc: Oral   Axillary  SpO2: 97%   97%  Weight:      Height:        General: Alert, oriented X3. Hard of hearing. Eyes: Pupils equal, reactive  Oral cavity: moist mucous membranes  Head: Atraumatic, normocephalic  Neck: supple  Chest: clear to auscultation. No crackles, no wheezes  CVS: S1,S2 RRR. No murmurs  Abd: No distention, soft, non-tender. No masses palpable  Extr: No edema   Skin: jaundice in lower extremities primarily  MSK: No joint deformities or swelling  Neurological: Grossly intact.    Data Reviewed:     Latest Ref Rng & Units 09/18/2023    4:00 AM 09/17/2023    4:28 AM 09/16/2023   12:20 PM  CMP  Glucose 70 - 99 mg/dL 811  914  782   BUN 8 - 23 mg/dL 30  28  26    Creatinine 0.61 - 1.24 mg/dL 9.56  2.13  1.38   Sodium 135 - 145 mmol/L 134  137  140   Potassium 3.5 - 5.1 mmol/L 3.3  3.5  4.0   Chloride 98 - 111 mmol/L 105  107  107   CO2 22 - 32 mmol/L 19  22  22    Calcium 8.9 - 10.3 mg/dL 8.1  8.2  8.9   Total Protein 6.5 - 8.1 g/dL 5.4  5.5  6.7   Total Bilirubin 0.0 - 1.2 mg/dL 3.4  3.8  2.4   Alkaline Phos 38 - 126 U/L 187  207  198   AST 15 - 41 U/L 120  285  213   ALT 0 - 44 U/L 192  316  167       Latest Ref Rng & Units 09/18/2023    4:00 AM 09/17/2023    4:28 AM 09/16/2023    12:20 PM  CBC  WBC 4.0 - 10.5 K/uL 6.5  11.2  8.2   Hemoglobin 13.0 - 17.0 g/dL 9.8  40.9  81.1   Hematocrit 39.0 - 52.0 % 28.8  29.6  35.0   Platelets 150 - 400 K/uL 109  124  174      Family Communication: n/a  Disposition: Status is: Inpatient Remains inpatient appropriate because: transfer to [facility] for ERCP   Planned Discharge Destination: Home    Time spent: 55 minutes  Author: Leeroy Bock, MD 09/18/2023 7:44 AM  For on call review [email or web address].

## 2023-09-18 NOTE — Plan of Care (Signed)

## 2023-09-18 NOTE — Plan of Care (Signed)

## 2023-09-19 ENCOUNTER — Ambulatory Visit (HOSPITAL_COMMUNITY): Payer: Medicare Other

## 2023-09-19 DIAGNOSIS — E1122 Type 2 diabetes mellitus with diabetic chronic kidney disease: Secondary | ICD-10-CM

## 2023-09-19 DIAGNOSIS — R7881 Bacteremia: Secondary | ICD-10-CM | POA: Diagnosis present

## 2023-09-19 DIAGNOSIS — N1831 Chronic kidney disease, stage 3a: Secondary | ICD-10-CM | POA: Diagnosis not present

## 2023-09-19 DIAGNOSIS — N179 Acute kidney failure, unspecified: Secondary | ICD-10-CM

## 2023-09-19 DIAGNOSIS — K8030 Calculus of bile duct with cholangitis, unspecified, without obstruction: Secondary | ICD-10-CM

## 2023-09-19 DIAGNOSIS — D6851 Activated protein C resistance: Secondary | ICD-10-CM | POA: Diagnosis not present

## 2023-09-19 DIAGNOSIS — A419 Sepsis, unspecified organism: Secondary | ICD-10-CM | POA: Diagnosis present

## 2023-09-19 DIAGNOSIS — K8309 Other cholangitis: Secondary | ICD-10-CM

## 2023-09-19 DIAGNOSIS — K838 Other specified diseases of biliary tract: Secondary | ICD-10-CM | POA: Diagnosis not present

## 2023-09-19 DIAGNOSIS — K828 Other specified diseases of gallbladder: Secondary | ICD-10-CM | POA: Diagnosis not present

## 2023-09-19 DIAGNOSIS — N189 Chronic kidney disease, unspecified: Secondary | ICD-10-CM

## 2023-09-19 DIAGNOSIS — D649 Anemia, unspecified: Secondary | ICD-10-CM

## 2023-09-19 LAB — CBC WITH DIFFERENTIAL/PLATELET
Abs Immature Granulocytes: 0.01 10*3/uL (ref 0.00–0.07)
Basophils Absolute: 0 10*3/uL (ref 0.0–0.1)
Basophils Relative: 0 %
Eosinophils Absolute: 0.1 10*3/uL (ref 0.0–0.5)
Eosinophils Relative: 3 %
HCT: 28.1 % — ABNORMAL LOW (ref 39.0–52.0)
Hemoglobin: 9.6 g/dL — ABNORMAL LOW (ref 13.0–17.0)
Immature Granulocytes: 0 %
Lymphocytes Relative: 10 %
Lymphs Abs: 0.5 10*3/uL — ABNORMAL LOW (ref 0.7–4.0)
MCH: 33.3 pg (ref 26.0–34.0)
MCHC: 34.2 g/dL (ref 30.0–36.0)
MCV: 97.6 fL (ref 80.0–100.0)
Monocytes Absolute: 0.5 10*3/uL (ref 0.1–1.0)
Monocytes Relative: 11 %
Neutro Abs: 3.4 10*3/uL (ref 1.7–7.7)
Neutrophils Relative %: 76 %
Platelets: 101 10*3/uL — ABNORMAL LOW (ref 150–400)
RBC: 2.88 MIL/uL — ABNORMAL LOW (ref 4.22–5.81)
RDW: 13.2 % (ref 11.5–15.5)
WBC: 4.5 10*3/uL (ref 4.0–10.5)
nRBC: 0 % (ref 0.0–0.2)

## 2023-09-19 LAB — GLUCOSE, CAPILLARY
Glucose-Capillary: 127 mg/dL — ABNORMAL HIGH (ref 70–99)
Glucose-Capillary: 141 mg/dL — ABNORMAL HIGH (ref 70–99)
Glucose-Capillary: 148 mg/dL — ABNORMAL HIGH (ref 70–99)
Glucose-Capillary: 177 mg/dL — ABNORMAL HIGH (ref 70–99)
Glucose-Capillary: 95 mg/dL (ref 70–99)

## 2023-09-19 LAB — COMPREHENSIVE METABOLIC PANEL
ALT: 141 U/L — ABNORMAL HIGH (ref 0–44)
AST: 95 U/L — ABNORMAL HIGH (ref 15–41)
Albumin: 2.5 g/dL — ABNORMAL LOW (ref 3.5–5.0)
Alkaline Phosphatase: 195 U/L — ABNORMAL HIGH (ref 38–126)
Anion gap: 7 (ref 5–15)
BUN: 23 mg/dL (ref 8–23)
CO2: 19 mmol/L — ABNORMAL LOW (ref 22–32)
Calcium: 8.5 mg/dL — ABNORMAL LOW (ref 8.9–10.3)
Chloride: 110 mmol/L (ref 98–111)
Creatinine, Ser: 1.86 mg/dL — ABNORMAL HIGH (ref 0.61–1.24)
GFR, Estimated: 35 mL/min — ABNORMAL LOW (ref >60)
Glucose, Bld: 103 mg/dL — ABNORMAL HIGH (ref 70–99)
Potassium: 3.6 mmol/L (ref 3.5–5.1)
Sodium: 136 mmol/L (ref 135–145)
Total Bilirubin: 2.4 mg/dL — ABNORMAL HIGH (ref 0.0–1.2)
Total Protein: 5.1 g/dL — ABNORMAL LOW (ref 6.5–8.1)

## 2023-09-19 LAB — CULTURE, BLOOD (ROUTINE X 2): Special Requests: ADEQUATE

## 2023-09-19 LAB — APTT: aPTT: 77 s — ABNORMAL HIGH (ref 24–36)

## 2023-09-19 MED ORDER — HEPARIN BOLUS VIA INFUSION
2500.0000 [IU] | Freq: Once | INTRAVENOUS | Status: AC
Start: 2023-09-19 — End: 2023-09-19
  Administered 2023-09-19: 2500 [IU] via INTRAVENOUS
  Filled 2023-09-19: qty 2500

## 2023-09-19 MED ORDER — HEPARIN (PORCINE) 25000 UT/250ML-% IV SOLN
1250.0000 [IU]/h | INTRAVENOUS | Status: DC
Start: 2023-09-19 — End: 2023-09-20
  Administered 2023-09-19: 1250 [IU]/h via INTRAVENOUS
  Filled 2023-09-19: qty 250

## 2023-09-19 MED ORDER — LACTATED RINGERS IV SOLN: INTRAVENOUS | Status: AC

## 2023-09-19 NOTE — Progress Notes (Signed)
Pharmacy Consult for heparin Indication:  factor V leiden mutation/ h/o DVT  Allergies  Allergen Reactions   Gadobenate Nausea And Vomiting    Immediately upon the infusion of 15mL multihance contrast  Patient had exteme nausea and vomiting.   No other symptoms noted .  No injury.  MRI scan was completed after patient felt better.   Immediately upon the infusion of 15mL multihance contrast  Patient had exteme nausea and vomiting.   No other symptoms noted .  No injury.  MRI scan was completed after patient felt better.       Patient Measurements: Height: 5\' 10"  (177.8 cm) Weight: 90.7 kg (200 lb) IBW/kg (Calculated) : 73 HEPARIN DW (KG): 90.7  Vital Signs: Temp: 98.4 F (36.9 C) (02/19 0830) Temp Source: Oral (02/19 0830) BP: 117/54 (02/19 0830) Pulse Rate: 57 (02/19 0830)  Labs: Recent Labs    09/17/23 0428 09/18/23 0400 09/19/23 0624  HGB 10.1* 9.8* 9.6*  HCT 29.6* 28.8* 28.1*  PLT 124* 109* 101*  LABPROT 19.6*  --   --   INR 1.6*  --   --   CREATININE 1.38* 1.54* 1.86*    Estimated Creatinine Clearance: 32.3 mL/min (A) (by C-G formula based on SCr of 1.86 mg/dL (H)).  Assessment: Arush Gatliff a 89 y.o. male requiring ERCP per GI- this is planned for 2/20. Pharmacy has been consulted for heparin d/t patient's risk of developing DVT  dosing.   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 This will require aPTT/HL correlation before transitioning to only heparin level monitoring. - LD 2/18 AM   Goal of Therapy:  aPTT 66-102 seconds Monitor platelets by anticoagulation protocol: Yes   Plan:  Heparin bolus via infusion at 2,500 units/hour - heparin will need to be held 6 hours prior to procedure on 2/20, timing of procedure unk  Start heparin at 1,250 units/hour  8 hour aPTT F/U restart DOAC   Jani Gravel, PharmD Clinical Pharmacist  09/19/2023 9:41 AM

## 2023-09-19 NOTE — Care Management Important Message (Signed)
Important Message  Patient Details  Name: Herbert Wood MRN: [id number] Date of Birth: 14-Dec-1934   Important Message Given:  Yes - Medicare IM     Dorena Bodo 09/19/2023, 1:47 PM

## 2023-09-19 NOTE — Anesthesia Preprocedure Evaluation (Signed)
Anesthesia Evaluation  Patient identified by MRN, date of birth, ID band Patient awake    Reviewed: Allergy & Precautions, H&P , NPO status , Patient's Chart, lab work & pertinent test results  Airway Mallampati: II  TM Distance: >3 FB Neck ROM: full    Dental no notable dental hx.    Pulmonary shortness of breath, former smoker   Pulmonary exam normal breath sounds clear to auscultation       Cardiovascular Exercise Tolerance: Good hypertension, + Peripheral Vascular Disease  (-) CAD and (-) Past MI Normal cardiovascular exam+ Valvular Problems/Murmurs  Rhythm:Regular Rate:Normal     Neuro/Psych  Neuromuscular disease negative neurological ROS  negative psych ROS   GI/Hepatic negative GI ROS, Neg liver ROS,,,  Endo/Other  diabetes, Type 2    Renal/GU Renal diseasenegative Renal ROSStage 3  negative genitourinary   Musculoskeletal  (+) Arthritis ,    Abdominal   Peds  Hematology  (+) Blood dyscrasia (factor V deficiency on eliquis), anemia   Anesthesia Other Findings   Reproductive/Obstetrics negative OB ROS                             Anesthesia Physical Anesthesia Plan  ASA: 3  Anesthesia Plan: General   Post-op Pain Management: Minimal or no pain anticipated   Induction: Intravenous  PONV Risk Score and Plan: Treatment may vary due to age or medical condition and Ondansetron  Airway Management Planned: Oral ETT  Additional Equipment: None  Intra-op Plan:   Post-operative Plan: Extubation in OR  Informed Consent: I have reviewed the patients History and Physical, chart, labs and discussed the procedure including the risks, benefits and alternatives for the proposed anesthesia with the patient or authorized representative who has indicated his/her understanding and acceptance.     Dental Advisory Given and Dental advisory given  Plan Discussed with: CRNA and  Surgeon  Anesthesia Plan Comments:        Anesthesia Quick Evaluation

## 2023-09-19 NOTE — H&P (View-Only) (Signed)
Consultation Note   Referring Provider:  [facility] PCP: [name], MD Primary Gastroenterologist:    [facility]  Reason for Consultation:  CBD stone DOA: 09/16/2023         Hospital Day: 4   ASSESSMENT    Brief Narrative:  89 y.o. 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 Patient transferred from [facility] with cholangitis 2/2 to choledocholithiasis  Choledocholithiasis with cholangitis / Klebsiella bacteremia.   Sigmoid colon cancer diagnosed June 2023 s/p left hemicolectomy. PET April 2024 showed R liver metastasis, s/p thermal ablation May 2024.  Was scheduled for another ablation this week but had to reschedule due to admission  [place] anemia.  Hgb 9.6, down from baseline around 12 but has gotten IVF  Acute thrombocytopenia ? Secondary to sepsis  Factor 5 Leiden mutation, heterozygous  On chronic anticoagulation   Principal Problem:   Sepsis (HCC) Active Problems:   Chronic kidney disease (CKD), stage III (moderate) (HCC)   Acute kidney injury superimposed on chronic kidney disease (HCC)   History of colon cancer   Essential hypertension   Dyslipidemia   Factor 5 Leiden mutation, heterozygous (HCC)   GERD without esophagitis   Type 2 diabetes mellitus with chronic kidney disease, without long-term current use of insulin (HCC)   Choledocholithiasis   Cholangitis   Bacteremia due to Klebsiella pneumoniae      PLAN:   --Getting Rocephin Q 24 hours --ERCP tomorrow at noon. The benefits and risks of ERCP with possible sphincterotomy not limited to cardiopulmonary complications of sedation, bleeding, infection, perforation,and pancreatitis were discussed with the patient who agrees to proceed.  --Will hold IV heparin at 7 am for ERCP  --NPO after MN   HPI   Patient was  admitted to [facility] 2/16 for chest tightness, mid to low back pain , N/V and fever. Liver chemistries found to have gotten progressively worse. He was bacteremic  Workup thus far:  Troponins normal. Alk phos 198, AST 213, ALT 167, Tbili 2.4 ( direct 1.4). Normal WBC, hgb 11.8 ( near baseline) EKG showed normal sinus rhythm with a rate of 60 with left axis deviation and Q waves anteroseptally.  CTA chest / abd / pelvis equivocal for acute cholecystitis. Also, increase in size of R liver lesion and some enlarging pulmonary nodules. RUQ showed sludge in the gallbladder lumen, dilated CBD to 11 mm, no secondary signs to suggest acute cholecystitis..Blood cultures positive for Klebsiella pneumoniae. Started on antibiotics, IVF and transferred to [facility]   Interval History:  MRCP + for choledocholithiasis.  He has not had any pain since being admitted to the hospital a couple of days ago.  No further nausea and vomiting.  MRCP 2/18 Choledocholithiasis with adjacent gallstones in the central common bile duct measuring 1.0 and 0.7 cm. Common bile duct measures up to 1.1 cm in caliber within the pancreatic head with mild intrahepatic biliary ductal dilatation. 2. Sludge in the gallbladder. Mild gallbladder wall thickening. 3. Known metastasis of the inferior right lobe of the liver, hepatic segment VI. 4. Trace pleural effusions, trace ascites, and mild anasarca. Tbili had risen to 3.8 after  admission but down to 2.4 today. Liver enzymes improving.    Previous GI Evaluations   July 2024 Surveillance colonoscopy  - One 5 mm polyp in the ascending colon, removed with a cold snare. Resected and retrieved. - One 1 mm polyp in the ascending colon, removed with a cold biopsy forceps. Resected and retrieved. - Patent end-to-side colo-colonic anastomosis, characterized by healthy appearing mucosa. Biopsied. - One 3 mm polyp in the rectum, removed with a cold snare. Resected and retrieved. - Diverticulosis in the  entire examined colon. - Non-bleeding internal hemorrhoids.  June 2023 Colonoscopy - Two 2 to 6 mm polyps in the transverse colon, removed with a cold snare. Resected and retrieved. - Likely malignant partially obstructing tumor at 42 cm proximal to the anus. Biopsied. Tattooed. - The distal rectum and anal verge are normal on retroflexion view.   Recent Labs    09/17/23 0428 09/18/23 0400 09/19/23 0624  WBC 11.2* 6.5 4.5  HGB 10.1* 9.8* 9.6*  HCT 29.6* 28.8* 28.1*  PLT 124* 109* 101*   Recent Labs    09/17/23 0428 09/18/23 0400 09/19/23 0624  NA 137 134* 136  K 3.5 3.3* 3.6  CL 107 105 110  CO2 22 19* 19*  GLUCOSE 107* 108* 103*  BUN 28* 30* 23  CREATININE 1.38* 1.54* 1.86*  CALCIUM 8.2* 8.1* 8.5*   Recent Labs    09/16/23 1220 09/17/23 0428 09/19/23 0624  PROT 6.7   < > 5.1*  ALBUMIN 3.6   < > 2.5*  AST 213*   < > 95*  ALT 167*   < > 141*  ALKPHOS 198*   < > 195*  BILITOT 2.4*   < > 2.4*  BILIDIR 1.0*  --   --   IBILI 1.4*  --   --    < > = values in this interval not displayed.   Recent Labs    09/17/23 0428  LABPROT 19.6*  INR 1.6*      Past Medical History:  Diagnosis Date   Adenomatous colon polyp    Anemia    Carotid bruit    Chronic dyspnea    Diabetes (HCC)    type 2   Diverticular disease    DVT (deep venous thrombosis) (HCC)    lower limb   Dyspnea    Factor V deficiency (HCC)    Hearing loss    bilateral   Hyperlipemia    Hypertension    Inguinal hernia    Kidney disease    stage 3   Lung nodule    Lymphadenopathy    Malignant neoplasm (HCC)    salivary gland   Murmur    Osteoarthritis    Senile purpura (HCC)     Past Surgical History:  Procedure Laterality Date   BIOPSY  01/10/2022   Procedure: BIOPSY;  Surgeon: Dolores Frame, MD;  [facility];  Service: Gastroenterology;;   COLON SURGERY     COLONOSCOPY WITH PROPOFOL N/A 01/10/2022   Procedure: COLONOSCOPY WITH PROPOFOL;  Surgeon: Dolores Frame, MD;  [facility];  Service: Gastroenterology;  Laterality: N/A;  per Soledad Gerlach, pt to arrive at 9:15   COLONOSCOPY WITH PROPOFOL N/A 02/27/2023   Procedure: COLONOSCOPY WITH PROPOFOL;  Surgeon: Dolores Frame, MD;  [facility];  Service: Gastroenterology;  Laterality: N/A;  9:15am;asa 3   ESOPHAGOGASTRODUODENOSCOPY (EGD) WITH PROPOFOL N/A 01/10/2022   Procedure: ESOPHAGOGASTRODUODENOSCOPY (EGD) WITH PROPOFOL;  Surgeon: Dolores Frame,  MD;  [facility];  Service: Gastroenterology;  Laterality: N/A;   HAND / FINGER LESION EXCISION     HERNIA REPAIR     IR RADIOLOGIST EVAL & MGMT  04/04/2023   IR RADIOLOGIST EVAL & MGMT  08/09/2023   MASTOIDECTOMY     PILONIDAL CYST EXCISION     POLYPECTOMY  01/10/2022   Procedure: POLYPECTOMY;  Surgeon: Dolores Frame, MD;  [facility];  Service: Gastroenterology;;   POLYPECTOMY  02/27/2023   Procedure: POLYPECTOMY;  Surgeon: Dolores Frame, MD;  [facility];  Service: Gastroenterology;;   RADIOLOGY WITH ANESTHESIA N/A 12/27/2022   Procedure: CT microwave ablation of the liver;  Surgeon: Irish Lack, MD;  Location: [facility];  Service: Radiology;  Laterality: N/A;   SUBMUCOSAL TATTOO INJECTION  01/10/2022   Procedure: SUBMUCOSAL TATTOO INJECTION;  Surgeon: Dolores Frame, MD;  [facility]: [facility];  Service: Gastroenterology;;   SURGERY OF LIP     skin cancer    History reviewed. No pertinent family history.  Prior to Admission medications   Medication Sig Start Date End Date Taking? Authorizing Provider  amitriptyline (ELAVIL) 10 MG tablet Take 10 mg by mouth every evening.   Yes [provider]  amLODipine (NORVASC) 5 MG tablet Take 5 mg by mouth 2 (two) times daily.   Yes [provider]  ascorbic acid (VITAMIN C/NATURAL ROSE HIPS) 1000 MG tablet Take 1,000 mg by mouth every evening.   Yes [provider]  atorvastatin (LIPITOR) 40 MG tablet Take 40 mg by mouth every evening.   Yes [provider]  carvedilol (COREG) 6.25 MG tablet Take 6.25 mg by mouth 2 (two) times daily with a meal.   Yes [provider]  Cholecalciferol (VITAMIN D3) 125 MCG (5000 UT) CAPS Take 5,000 Units by mouth every evening.   Yes [provider]  dorzolamide (TRUSOPT) 2 % ophthalmic solution Place 1 drop into the left eye 2 (two) times daily.   Yes [provider]  ELIQUIS 5 MG TABS tablet TAKE 1 TABLET(5 MG) BY MOUTH TWICE DAILY 06/05/23  Yes [name], MD  ferrous sulfate 325 (65 FE) MG tablet Take 325 mg by mouth daily with breakfast.   Yes [provider]  folic acid (FOLVITE) 800 MCG tablet Take 800 mcg by mouth every evening. With B12 Now (Brand)   Yes [provider]  hydrALAZINE (APRESOLINE) 25 MG tablet Take 50 mg by mouth in the morning and at bedtime.   Yes [provider]  HYDROcodone-acetaminophen (NORCO) 5-325 MG tablet Take 1 tablet by mouth every 8 (eight) hours as needed for moderate pain (pain score 4-6). 07/18/23  Yes [name], MD  KERENDIA 10 MG TABS Take 10 mg by mouth daily.   Yes [provider]  latanoprost (XALATAN) 0.005 % ophthalmic solution Place 1 drop into both eyes at bedtime.   Yes [provider]  linagliptin (TRADJENTA) 5 MG TABS tablet Take 5 mg by mouth in the morning.   Yes [provider]  loratadine (CLARITIN) 10 MG tablet Take 10 mg by mouth every morning.   Yes [provider]  Misc Natural Products (PROSTATE SUPPORT PO) Take 1 capsule by mouth every evening. NOW Prostate Support   Yes [provider]  omeprazole (PRILOSEC) 40 MG capsule Take 1 capsule (40 mg total) by mouth daily. 05/21/23  Yes Carlan, Chelsea L, NP  ondansetron (ZOFRAN-ODT) 4 MG disintegrating tablet 4mg  ODT  q4 hours prn nausea/vomit 09/16/23  Yes Bethann Berkshire, MD   tobramycin (TOBREX) 0.3 % ophthalmic solution INSTILL 5 DROPS TO RIGHT EAR TWICE DAILY FOR 7 DAYS   Yes [provider]  traMADol (ULTRAM) 50 MG tablet Take 1 tablet (50 mg total) by mouth every 6 (six) hours as needed. 07/18/23  Yes [name], MD  predniSONE (DELTASONE) 50 MG tablet Take 1 tablet 13 hours prior to scan, 7 hours prior to scan and 1 hour prior to scan for contrast allergy Take 50 mg Benadryl 1 hour prior to scan 06/27/23   [name], MD  sucralfate (CARAFATE) 1 GM/10ML suspension Take 10 mLs (1 g total) by mouth 4 (four) times daily. Patient not taking: Reported on 09/04/2023 02/05/23   Raquel James, NP  vitamin B-12 (CYANOCOBALAMIN) 1000 MCG tablet Take 1,000 mcg by mouth every evening. Now (Brand)    [provider]    Current Facility-Administered Medications  Medication Dose Route Frequency Provider Last Rate Last Admin   acetaminophen (TYLENOL) tablet 650 mg  650 mg Oral Q6H PRN Mansy, Vernetta Honey, MD       Or   acetaminophen (TYLENOL) suppository 650 mg  650 mg Rectal Q6H PRN Mansy, Jan A, MD       amitriptyline (ELAVIL) tablet 10 mg  10 mg Oral QPM Mdala-Gausi, Masiku Agatha, MD   10 mg at 09/18/23 1722   amLODipine (NORVASC) tablet 5 mg  5 mg Oral BID Mansy, Jan A, MD   5 mg at 09/19/23 1018   ascorbic acid (VITAMIN C) tablet 1,000 mg  1,000 mg Oral QPM Mansy, Jan A, MD   1,000 mg at 09/18/23 1722   carvedilol (COREG) tablet 6.25 mg  6.25 mg Oral BID WC Mansy, Jan A, MD   6.25 mg at 09/18/23 1722   cefTRIAXone (ROCEPHIN) 2 g in sodium chloride 0.9 % 100 mL IVPB  2 g Intravenous Q24H Leeroy Bock, MD   Stopped at 09/18/23 1158   cholecalciferol (VITAMIN D3) 25 MCG (1000 UNIT) tablet 5,000 Units  5,000 Units Oral QPM Mansy, Jan A, MD   5,000 Units at 09/18/23 1721   cyanocobalamin (VITAMIN B12) tablet 1,000 mcg  1,000 mcg Oral QPM Mansy, Jan A, MD   1,000 mcg at 09/18/23 1722   dorzolamide (TRUSOPT) 2 % ophthalmic solution 1 drop   1 drop Left Eye BID Mansy, Jan A, MD   1 drop at 09/18/23 0908   ferrous sulfate tablet 325 mg  325 mg Oral Q breakfast Mansy, Jan A, MD   325 mg at 09/19/23 8295   Finerenone TABS 10 mg  10 mg Oral Daily Mansy, Jan A, MD   10 mg at 09/18/23 1043   folic acid (FOLVITE) tablet 1 mg  1 mg Oral QPM Mansy, Jan A, MD   1 mg at 09/18/23 1721   heparin ADULT infusion 100 units/mL (25000 units/246mL)  1,250 Units/hr Intravenous Continuous Paytes, Austin A, RPH       heparin bolus via infusion 2,500 Units  2,500 Units Intravenous Once Paytes, Austin A, RPH       hydrALAZINE (APRESOLINE) tablet 50 mg  50 mg Oral BID Mansy, Jan A, MD   50 mg at 09/19/23 1018   insulin aspart (novoLOG) injection 0-15 Units  0-15 Units Subcutaneous TID WC Mdala-Gausi, Gwenette Greet, MD   2 Units at 09/18/23 1242   lactated ringers infusion   Intravenous Continuous Rai, Ripudeep K, MD 100 mL/hr at 09/19/23  1014 New Bag at 09/19/23 1014   latanoprost (XALATAN) 0.005 % ophthalmic solution 1 drop  1 drop Both Eyes QHS Mansy, Jan A, MD   1 drop at 09/17/23 2110   linagliptin (TRADJENTA) tablet 5 mg  5 mg Oral q AM Mansy, Jan A, MD   5 mg at 09/19/23 0826   loratadine (CLARITIN) tablet 10 mg  10 mg Oral q morning Mansy, Jan A, MD   10 mg at 09/19/23 1018   magnesium hydroxide (MILK OF MAGNESIA) suspension 30 mL  30 mL Oral Daily PRN Mansy, Jan A, MD       ondansetron [facility]) tablet 4 mg  4 mg Oral Q6H PRN Mansy, Jan A, MD       Or   ondansetron [facility]) injection 4 mg  4 mg Intravenous Q6H PRN Mansy, Jan A, MD   4 mg at 09/18/23 0818   pantoprazole (PROTONIX) EC tablet 40 mg  40 mg Oral BID AC Gelene Mink, NP   40 mg at 09/19/23 0826   traZODone (DESYREL) tablet 50 mg  50 mg Oral QHS PRN Mansy, Jan A, MD        Allergies as of 09/16/2023 - Review Complete 09/16/2023  Allergen Reaction Noted   Gadobenate Nausea And Vomiting 05/20/2015    Social History   Socioeconomic History   Marital status: Married    Spouse name: Not  on file   Number of children: Not on file   Years of education: Not on file   Highest education level: Not on file  Occupational History   Not on file  Tobacco Use   Smoking status: Former    Current packs/day: 0.00    Average packs/day: 2.0 packs/day for 25.0 years (50.0 ttl pk-yrs)    Types: Cigarettes    Start date: 57    Quit date: 10    Years since quitting: 30.1    Passive exposure: Past   Smokeless tobacco: Never  Vaping Use   Vaping status: Never Used  Substance and Sexual Activity   Alcohol use: Never   Drug use: Never   Sexual activity: Not on file  Other Topics Concern   Not on file  Social History Narrative   Not on file   Social Drivers of Health   Financial Resource Strain: Not on file  Food Insecurity: No Food Insecurity (09/16/2023)   Hunger Vital Sign    Worried About Running Out of Food in the Last Year: Never true    Ran Out of Food in the Last Year: Never true  Transportation Needs: No Transportation Needs (09/16/2023)   PRAPARE - Administrator, Civil Service (Medical): No    Lack of Transportation (Non-Medical): No  Physical Activity: Not on file  Stress: Not on file  Social Connections: Moderately Integrated (09/16/2023)   Social Connection and Isolation Panel [NHANES]    Frequency of Communication with Friends and Family: More than three times a week    Frequency of Social Gatherings with Friends and Family: Once a week    Attends Religious Services: More than 4 times per year    Active Member of [employer] or Organizations: No    Attends Banker Meetings: Never    Marital Status: Married  Catering manager Violence: Not At Risk (09/16/2023)   Humiliation, Afraid, Rape, and Kick questionnaire    Fear of Current or Ex-Partner: No    Emotionally Abused: No    Physically Abused: No  Sexually Abused: No     Code Status   Code Status: Full Code  Review of Systems: All systems reviewed and negative except where noted  in HPI.  Physical Exam: Vital signs in last 24 hours: Temp:  [97.6 F (36.4 C)-98.4 F (36.9 C)] 98.4 F (36.9 C) (02/19 0830) Pulse Rate:  [51-60] 53 (02/19 1016) Resp:  [17-18] 17 (02/19 0830) BP: (111-148)/(48-66) 148/50 (02/19 1016) SpO2:  [94 %-97 %] 96 % (02/19 0830) Last BM Date : 09/17/23  General:  Pleasant male in NAD Psych:  Cooperative. Normal mood and affect Eyes: Pupils equal Ears:  Normal auditory acuity Nose: No deformity, discharge or lesions Neck:  Supple, no masses felt Lungs:  Clear to auscultation.  Heart:  Regular rate, regular rhythm.  Abdomen:  Soft, nondistended, left mid abdominal hernia,  active bowel sounds, no masses felt Rectal :  Deferred Msk: Symmetrical without gross deformities.  Neurologic:  Alert, oriented, grossly normal neurologically Extremities : No edema Skin:  Intact without significant lesions.    Intake/Output from previous day: 02/18 0701 - 02/19 0700 In: 227.5 [IV Piggyback:227.5] Out: -  Intake/Output this shift:  No intake/output data recorded.   Willette Cluster, NP-C   09/19/2023, 10:23 AM

## 2023-09-19 NOTE — Plan of Care (Signed)

## 2023-09-19 NOTE — Progress Notes (Addendum)
Pharmacy Consult for heparin Indication:  factor V leiden mutation/ h/o DVT  Allergies  Allergen Reactions   Gadobenate Nausea And Vomiting    Immediately upon the infusion of 15mL multihance contrast  Patient had exteme nausea and vomiting.   No other symptoms noted .  No injury.  MRI scan was completed after patient felt better.   Immediately upon the infusion of 15mL multihance contrast  Patient had exteme nausea and vomiting.   No other symptoms noted .  No injury.  MRI scan was completed after patient felt better.       Patient Measurements: Height: 5\' 10"  (177.8 cm) Weight: 90.7 kg (200 lb) IBW/kg (Calculated) : 73 HEPARIN DW (KG): 90.7  Vital Signs: Temp: 98.4 F (36.9 C) (02/19 1607) Temp Source: Oral (02/19 1607) BP: 120/59 (02/19 1607) Pulse Rate: 51 (02/19 1607)  Labs: Recent Labs    09/17/23 0428 09/18/23 0400 09/19/23 0624 09/19/23 1819  HGB 10.1* 9.8* 9.6*  --   HCT 29.6* 28.8* 28.1*  --   PLT 124* 109* 101*  --   APTT  --   --   --  77*  LABPROT 19.6*  --   --   --   INR 1.6*  --   --   --   CREATININE 1.38* 1.54* 1.86*  --     Estimated Creatinine Clearance: 32.3 mL/min (A) (by C-G formula based on SCr of 1.86 mg/dL (H)).  Assessment: Herbert Wood a 89 y.o. male requiring ERCP per GI- this is planned for 2/20. Pharmacy has been consulted for heparin d/t patient's risk of developing DVT  dosing.   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 This will require aPTT/HL correlation before transitioning to only heparin level monitoring. - LD 2/18 AM   aPTT therapeutic at 77. No more levels prior to stopping infusion tomorrow at 07:00.   Goal of Therapy:  aPTT 66-102 seconds Monitor platelets by anticoagulation protocol: Yes   Plan:  Continue heparin at 1,250 units/hour  Heparin stop time in for tomorrow at 07:00  F/U restart DOAC   Cedric Fishman, PharmD, BCPS, BCCCP Clinical  Pharmacist

## 2023-09-19 NOTE — Consult Note (Signed)
Consultation Note   Referring Provider:  [facility] PCP: [name], MD Primary Gastroenterologist:    [facility]  Reason for Consultation:  CBD stone DOA: 09/16/2023         Hospital Day: 4   ASSESSMENT    Brief Narrative:  89 y.o. 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 Patient transferred from [facility] with cholangitis 2/2 to choledocholithiasis  Choledocholithiasis with cholangitis / Klebsiella bacteremia.   Sigmoid colon cancer diagnosed June 2023 s/p left hemicolectomy. PET April 2024 showed R liver metastasis, s/p thermal ablation May 2024.  Was scheduled for another ablation this week but had to reschedule due to admission  [place] anemia.  Hgb 9.6, down from baseline around 12 but has gotten IVF  Acute thrombocytopenia ? Secondary to sepsis  Factor 5 Leiden mutation, heterozygous  On chronic anticoagulation   Principal Problem:   Sepsis (HCC) Active Problems:   Chronic kidney disease (CKD), stage III (moderate) (HCC)   Acute kidney injury superimposed on chronic kidney disease (HCC)   History of colon cancer   Essential hypertension   Dyslipidemia   Factor 5 Leiden mutation, heterozygous (HCC)   GERD without esophagitis   Type 2 diabetes mellitus with chronic kidney disease, without long-term current use of insulin (HCC)   Choledocholithiasis   Cholangitis   Bacteremia due to Klebsiella pneumoniae      PLAN:   --Getting Rocephin Q 24 hours --ERCP tomorrow at noon. The benefits and risks of ERCP with possible sphincterotomy not limited to cardiopulmonary complications of sedation, bleeding, infection, perforation,and pancreatitis were discussed with the patient who agrees to proceed.  --Will hold IV heparin at 7 am for ERCP  --NPO after MN   HPI   Patient was  admitted to [facility] 2/16 for chest tightness, mid to low back pain , N/V and fever. Liver chemistries found to have gotten progressively worse. He was bacteremic  Workup thus far:  Troponins normal. Alk phos 198, AST 213, ALT 167, Tbili 2.4 ( direct 1.4). Normal WBC, hgb 11.8 ( near baseline) EKG showed normal sinus rhythm with a rate of 60 with left axis deviation and Q waves anteroseptally.  CTA chest / abd / pelvis equivocal for acute cholecystitis. Also, increase in size of R liver lesion and some enlarging pulmonary nodules. RUQ showed sludge in the gallbladder lumen, dilated CBD to 11 mm, no secondary signs to suggest acute cholecystitis..Blood cultures positive for Klebsiella pneumoniae. Started on antibiotics, IVF and transferred to [facility]   Interval History:  MRCP + for choledocholithiasis.  He has not had any pain since being admitted to the hospital a couple of days ago.  No further nausea and vomiting.  MRCP 2/18 Choledocholithiasis with adjacent gallstones in the central common bile duct measuring 1.0 and 0.7 cm. Common bile duct measures up to 1.1 cm in caliber within the pancreatic head with mild intrahepatic biliary ductal dilatation. 2. Sludge in the gallbladder. Mild gallbladder wall thickening. 3. Known metastasis of the inferior right lobe of the liver, hepatic segment VI. 4. Trace pleural effusions, trace ascites, and mild anasarca. Tbili had risen to 3.8 after  admission but down to 2.4 today. Liver enzymes improving.    Previous GI Evaluations   July 2024 Surveillance colonoscopy  - One 5 mm polyp in the ascending colon, removed with a cold snare. Resected and retrieved. - One 1 mm polyp in the ascending colon, removed with a cold biopsy forceps. Resected and retrieved. - Patent end-to-side colo-colonic anastomosis, characterized by healthy appearing mucosa. Biopsied. - One 3 mm polyp in the rectum, removed with a cold snare. Resected and retrieved. - Diverticulosis in the  entire examined colon. - Non-bleeding internal hemorrhoids.  June 2023 Colonoscopy - Two 2 to 6 mm polyps in the transverse colon, removed with a cold snare. Resected and retrieved. - Likely malignant partially obstructing tumor at 42 cm proximal to the anus. Biopsied. Tattooed. - The distal rectum and anal verge are normal on retroflexion view.   Recent Labs    09/17/23 0428 09/18/23 0400 09/19/23 0624  WBC 11.2* 6.5 4.5  HGB 10.1* 9.8* 9.6*  HCT 29.6* 28.8* 28.1*  PLT 124* 109* 101*   Recent Labs    09/17/23 0428 09/18/23 0400 09/19/23 0624  NA 137 134* 136  K 3.5 3.3* 3.6  CL 107 105 110  CO2 22 19* 19*  GLUCOSE 107* 108* 103*  BUN 28* 30* 23  CREATININE 1.38* 1.54* 1.86*  CALCIUM 8.2* 8.1* 8.5*   Recent Labs    09/16/23 1220 09/17/23 0428 09/19/23 0624  PROT 6.7   < > 5.1*  ALBUMIN 3.6   < > 2.5*  AST 213*   < > 95*  ALT 167*   < > 141*  ALKPHOS 198*   < > 195*  BILITOT 2.4*   < > 2.4*  BILIDIR 1.0*  --   --   IBILI 1.4*  --   --    < > = values in this interval not displayed.   Recent Labs    09/17/23 0428  LABPROT 19.6*  INR 1.6*      Past Medical History:  Diagnosis Date   Adenomatous colon polyp    Anemia    Carotid bruit    Chronic dyspnea    Diabetes (HCC)    type 2   Diverticular disease    DVT (deep venous thrombosis) (HCC)    lower limb   Dyspnea    Factor V deficiency (HCC)    Hearing loss    bilateral   Hyperlipemia    Hypertension    Inguinal hernia    Kidney disease    stage 3   Lung nodule    Lymphadenopathy    Malignant neoplasm (HCC)    salivary gland   Murmur    Osteoarthritis    Senile purpura (HCC)     Past Surgical History:  Procedure Laterality Date   BIOPSY  01/10/2022   Procedure: BIOPSY;  Surgeon: Dolores Frame, MD;  [facility];  Service: Gastroenterology;;   COLON SURGERY     COLONOSCOPY WITH PROPOFOL N/A 01/10/2022   Procedure: COLONOSCOPY WITH PROPOFOL;  Surgeon: Dolores Frame, MD;  [facility]: [facility];  Service: Gastroenterology;  Laterality: N/A;  per Soledad Gerlach, pt to arrive at 9:15   COLONOSCOPY WITH PROPOFOL N/A 02/27/2023   Procedure: COLONOSCOPY WITH PROPOFOL;  Surgeon: Dolores Frame, MD;  [facility]: [facility];  Service: Gastroenterology;  Laterality: N/A;  9:15am;asa 3   ESOPHAGOGASTRODUODENOSCOPY (EGD) WITH PROPOFOL N/A 01/10/2022   Procedure: ESOPHAGOGASTRODUODENOSCOPY (EGD) WITH PROPOFOL;  Surgeon: Dolores Frame,  MD;  [facility];  Service: Gastroenterology;  Laterality: N/A;   HAND / FINGER LESION EXCISION     HERNIA REPAIR     IR RADIOLOGIST EVAL & MGMT  04/04/2023   IR RADIOLOGIST EVAL & MGMT  08/09/2023   MASTOIDECTOMY     PILONIDAL CYST EXCISION     POLYPECTOMY  01/10/2022   Procedure: POLYPECTOMY;  Surgeon: Dolores Frame, MD;  [facility]: [facility];  Service: Gastroenterology;;   POLYPECTOMY  02/27/2023   Procedure: POLYPECTOMY;  Surgeon: Dolores Frame, MD;  [facility];  Service: Gastroenterology;;   RADIOLOGY WITH ANESTHESIA N/A 12/27/2022   Procedure: CT microwave ablation of the liver;  Surgeon: Irish Lack, MD;  Location: [facility];  Service: Radiology;  Laterality: N/A;   SUBMUCOSAL TATTOO INJECTION  01/10/2022   Procedure: SUBMUCOSAL TATTOO INJECTION;  Surgeon: Dolores Frame, MD;  [facility]: [facility];  Service: Gastroenterology;;   SURGERY OF LIP     skin cancer    History reviewed. No pertinent family history.  Prior to Admission medications   Medication Sig Start Date End Date Taking? Authorizing Provider  amitriptyline (ELAVIL) 10 MG tablet Take 10 mg by mouth every evening.   Yes [provider]  amLODipine (NORVASC) 5 MG tablet Take 5 mg by mouth 2 (two) times daily.   Yes [provider]  ascorbic acid (VITAMIN C/NATURAL ROSE HIPS) 1000 MG tablet Take 1,000 mg by mouth every evening.   Yes [provider]  atorvastatin (LIPITOR) 40 MG tablet Take 40 mg by mouth every evening.   Yes [provider]  carvedilol (COREG) 6.25 MG tablet Take 6.25 mg by mouth 2 (two) times daily with a meal.   Yes [provider]  Cholecalciferol (VITAMIN D3) 125 MCG (5000 UT) CAPS Take 5,000 Units by mouth every evening.   Yes [provider]  dorzolamide (TRUSOPT) 2 % ophthalmic solution Place 1 drop into the left eye 2 (two) times daily.   Yes [provider]  ELIQUIS 5 MG TABS tablet TAKE 1 TABLET(5 MG) BY MOUTH TWICE DAILY 06/05/23  Yes [name], MD  ferrous sulfate 325 (65 FE) MG tablet Take 325 mg by mouth daily with breakfast.   Yes [provider]  folic acid (FOLVITE) 800 MCG tablet Take 800 mcg by mouth every evening. With B12 Now (Brand)   Yes [provider]  hydrALAZINE (APRESOLINE) 25 MG tablet Take 50 mg by mouth in the morning and at bedtime.   Yes [provider]  HYDROcodone-acetaminophen (NORCO) 5-325 MG tablet Take 1 tablet by mouth every 8 (eight) hours as needed for moderate pain (pain score 4-6). 07/18/23  Yes [name], MD  KERENDIA 10 MG TABS Take 10 mg by mouth daily.   Yes [provider]  latanoprost (XALATAN) 0.005 % ophthalmic solution Place 1 drop into both eyes at bedtime.   Yes [provider]  linagliptin (TRADJENTA) 5 MG TABS tablet Take 5 mg by mouth in the morning.   Yes [provider]  loratadine (CLARITIN) 10 MG tablet Take 10 mg by mouth every morning.   Yes [provider]  Misc Natural Products (PROSTATE SUPPORT PO) Take 1 capsule by mouth every evening. NOW Prostate Support   Yes [provider]  omeprazole (PRILOSEC) 40 MG capsule Take 1 capsule (40 mg total) by mouth daily. 05/21/23  Yes Carlan, Chelsea L, NP  ondansetron (ZOFRAN-ODT) 4 MG disintegrating tablet 4mg  ODT  q4 hours prn nausea/vomit 09/16/23  Yes Bethann Berkshire, MD   tobramycin (TOBREX) 0.3 % ophthalmic solution INSTILL 5 DROPS TO RIGHT EAR TWICE DAILY FOR 7 DAYS   Yes [provider]  traMADol (ULTRAM) 50 MG tablet Take 1 tablet (50 mg total) by mouth every 6 (six) hours as needed. 07/18/23  Yes [name], MD  predniSONE (DELTASONE) 50 MG tablet Take 1 tablet 13 hours prior to scan, 7 hours prior to scan and 1 hour prior to scan for contrast allergy Take 50 mg Benadryl 1 hour prior to scan 06/27/23   [name], MD  sucralfate (CARAFATE) 1 GM/10ML suspension Take 10 mLs (1 g total) by mouth 4 (four) times daily. Patient not taking: Reported on 09/04/2023 02/05/23   Raquel James, NP  vitamin B-12 (CYANOCOBALAMIN) 1000 MCG tablet Take 1,000 mcg by mouth every evening. Now (Brand)    [provider]    Current Facility-Administered Medications  Medication Dose Route Frequency Provider Last Rate Last Admin   acetaminophen (TYLENOL) tablet 650 mg  650 mg Oral Q6H PRN Mansy, Vernetta Honey, MD       Or   acetaminophen (TYLENOL) suppository 650 mg  650 mg Rectal Q6H PRN Mansy, Jan A, MD       amitriptyline (ELAVIL) tablet 10 mg  10 mg Oral QPM Mdala-Gausi, Masiku Agatha, MD   10 mg at 09/18/23 1722   amLODipine (NORVASC) tablet 5 mg  5 mg Oral BID Mansy, Jan A, MD   5 mg at 09/19/23 1018   ascorbic acid (VITAMIN C) tablet 1,000 mg  1,000 mg Oral QPM Mansy, Jan A, MD   1,000 mg at 09/18/23 1722   carvedilol (COREG) tablet 6.25 mg  6.25 mg Oral BID WC Mansy, Jan A, MD   6.25 mg at 09/18/23 1722   cefTRIAXone (ROCEPHIN) 2 g in sodium chloride 0.9 % 100 mL IVPB  2 g Intravenous Q24H Leeroy Bock, MD   Stopped at 09/18/23 1158   cholecalciferol (VITAMIN D3) 25 MCG (1000 UNIT) tablet 5,000 Units  5,000 Units Oral QPM Mansy, Jan A, MD   5,000 Units at 09/18/23 1721   cyanocobalamin (VITAMIN B12) tablet 1,000 mcg  1,000 mcg Oral QPM Mansy, Jan A, MD   1,000 mcg at 09/18/23 1722   dorzolamide (TRUSOPT) 2 % ophthalmic solution 1 drop   1 drop Left Eye BID Mansy, Jan A, MD   1 drop at 09/18/23 0908   ferrous sulfate tablet 325 mg  325 mg Oral Q breakfast Mansy, Jan A, MD   325 mg at 09/19/23 8295   Finerenone TABS 10 mg  10 mg Oral Daily Mansy, Jan A, MD   10 mg at 09/18/23 1043   folic acid (FOLVITE) tablet 1 mg  1 mg Oral QPM Mansy, Jan A, MD   1 mg at 09/18/23 1721   heparin ADULT infusion 100 units/mL (25000 units/246mL)  1,250 Units/hr Intravenous Continuous Paytes, Austin A, RPH       heparin bolus via infusion 2,500 Units  2,500 Units Intravenous Once Paytes, Austin A, RPH       hydrALAZINE (APRESOLINE) tablet 50 mg  50 mg Oral BID Mansy, Jan A, MD   50 mg at 09/19/23 1018   insulin aspart (novoLOG) injection 0-15 Units  0-15 Units Subcutaneous TID WC Mdala-Gausi, Gwenette Greet, MD   2 Units at 09/18/23 1242   lactated ringers infusion   Intravenous Continuous Rai, Ripudeep K, MD 100 mL/hr at 09/19/23  1014 New Bag at 09/19/23 1014   latanoprost (XALATAN) 0.005 % ophthalmic solution 1 drop  1 drop Both Eyes QHS Mansy, Jan A, MD   1 drop at 09/17/23 2110   linagliptin (TRADJENTA) tablet 5 mg  5 mg Oral q AM Mansy, Jan A, MD   5 mg at 09/19/23 0826   loratadine (CLARITIN) tablet 10 mg  10 mg Oral q morning Mansy, Jan A, MD   10 mg at 09/19/23 1018   magnesium hydroxide (MILK OF MAGNESIA) suspension 30 mL  30 mL Oral Daily PRN Mansy, Jan A, MD       ondansetron [facility]) tablet 4 mg  4 mg Oral Q6H PRN Mansy, Jan A, MD       Or   ondansetron [facility]) injection 4 mg  4 mg Intravenous Q6H PRN Mansy, Jan A, MD   4 mg at 09/18/23 0818   pantoprazole (PROTONIX) EC tablet 40 mg  40 mg Oral BID AC Gelene Mink, NP   40 mg at 09/19/23 0826   traZODone (DESYREL) tablet 50 mg  50 mg Oral QHS PRN Mansy, Jan A, MD        Allergies as of 09/16/2023 - Review Complete 09/16/2023  Allergen Reaction Noted   Gadobenate Nausea And Vomiting 05/20/2015    Social History   Socioeconomic History   Marital status: Married    Spouse name: Not  on file   Number of children: Not on file   Years of education: Not on file   Highest education level: Not on file  Occupational History   Not on file  Tobacco Use   Smoking status: Former    Current packs/day: 0.00    Average packs/day: 2.0 packs/day for 25.0 years (50.0 ttl pk-yrs)    Types: Cigarettes    Start date: 57    Quit date: 10    Years since quitting: 30.1    Passive exposure: Past   Smokeless tobacco: Never  Vaping Use   Vaping status: Never Used  Substance and Sexual Activity   Alcohol use: Never   Drug use: Never   Sexual activity: Not on file  Other Topics Concern   Not on file  Social History Narrative   Not on file   Social Drivers of Health   Financial Resource Strain: Not on file  Food Insecurity: No Food Insecurity (09/16/2023)   Hunger Vital Sign    Worried About Running Out of Food in the Last Year: Never true    Ran Out of Food in the Last Year: Never true  Transportation Needs: No Transportation Needs (09/16/2023)   PRAPARE - Administrator, Civil Service (Medical): No    Lack of Transportation (Non-Medical): No  Physical Activity: Not on file  Stress: Not on file  Social Connections: Moderately Integrated (09/16/2023)   Social Connection and Isolation Panel [NHANES]    Frequency of Communication with Friends and Family: More than three times a week    Frequency of Social Gatherings with Friends and Family: Once a week    Attends Religious Services: More than 4 times per year    Active Member of [employer] or Organizations: No    Attends Banker Meetings: Never    Marital Status: Married  Catering manager Violence: Not At Risk (09/16/2023)   Humiliation, Afraid, Rape, and Kick questionnaire    Fear of Current or Ex-Partner: No    Emotionally Abused: No    Physically Abused: No  Sexually Abused: No     Code Status   Code Status: Full Code  Review of Systems: All systems reviewed and negative except where noted  in HPI.  Physical Exam: Vital signs in last 24 hours: Temp:  [97.6 F (36.4 C)-98.4 F (36.9 C)] 98.4 F (36.9 C) (02/19 0830) Pulse Rate:  [51-60] 53 (02/19 1016) Resp:  [17-18] 17 (02/19 0830) BP: (111-148)/(48-66) 148/50 (02/19 1016) SpO2:  [94 %-97 %] 96 % (02/19 0830) Last BM Date : 09/17/23  General:  Pleasant male in NAD Psych:  Cooperative. Normal mood and affect Eyes: Pupils equal Ears:  Normal auditory acuity Nose: No deformity, discharge or lesions Neck:  Supple, no masses felt Lungs:  Clear to auscultation.  Heart:  Regular rate, regular rhythm.  Abdomen:  Soft, nondistended, left mid abdominal hernia,  active bowel sounds, no masses felt Rectal :  Deferred Msk: Symmetrical without gross deformities.  Neurologic:  Alert, oriented, grossly normal neurologically Extremities : No edema Skin:  Intact without significant lesions.    Intake/Output from previous day: 02/18 0701 - 02/19 0700 In: 227.5 [IV Piggyback:227.5] Out: -  Intake/Output this shift:  No intake/output data recorded.   Willette Cluster, NP-C   09/19/2023, 10:23 AM

## 2023-09-19 NOTE — Plan of Care (Signed)
  Problem: Education: Goal: Knowledge of General Education information will improve Description: Including pain rating scale, medication(s)/side effects and non-pharmacologic comfort measures Outcome: Progressing   Problem: Health Behavior/Discharge Planning: Goal: Ability to manage health-related needs will improve Outcome: Progressing   Problem: Clinical Measurements: Goal: Respiratory complications will improve Outcome: Progressing   Problem: Nutrition: Goal: Adequate nutrition will be maintained Outcome: Progressing   Problem: Coping: Goal: Level of anxiety will decrease Outcome: Progressing   Problem: Elimination: Goal: Will not experience complications related to urinary retention Outcome: Progressing   Problem: Safety: Goal: Ability to remain free from injury will improve Outcome: Progressing   Problem: Skin Integrity: Goal: Risk for impaired skin integrity will decrease Outcome: Progressing   Problem: Respiratory: Goal: Ability to maintain adequate ventilation will improve Outcome: Progressing   Problem: Health Behavior/Discharge Planning: Goal: Ability to manage health-related needs will improve Outcome: Progressing   Problem: Skin Integrity: Goal: Risk for impaired skin integrity will decrease Outcome: Progressing

## 2023-09-19 NOTE — Progress Notes (Addendum)
[facility]                                                                              Osceola Depaz, is a 89 y.o. 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  In ED, temp 102.1 F, BP soft, 90/42, noted some hypoxia, 88 to 89% on room air, HR 65, RR 17-21 Mild AKI with creatinine 1.3, was 1.19 on 09/07/2023, elevated transaminitis, procalcitonin 8.35 Started on broad-spectrum antibiotics.  Admitted for further workup  2/18: BxCx positive for klebsiella. Started on ceftriaxone MRCP positive for choledocholithiasis.  Seen by GI at [facility], patient was transferred to [facility] for ERCP an further workup.    Assessment & Plan    Principal Problem:   Sepsis (HCC) with Klebsiella bacteremia, POA -Patient met sepsis criteria at the time of admission with temp of 102.1 F, hypotensive BP 90/42, mild hypoxia (resolved), mild AKI, transaminitis, source likely due to cholangitis, choledocholithiasis. -Blood cultures positive for Klebsiella pneumonia -Continue IV Rocephin 2 g IV daily, will repeat blood cultures -Sepsis physiology  is improving   Active Problems: Acute ascending cholangitis with choledocholithiasis -MRCP showed choledocholithiasis with CBD of 1.1 cm -Discussed with GI, Dr Leonides Schanz, ERCP planned for tomorrow 09/20/2023 -N.p.o. after midnight   Acute on chronic kidney disease (CKD), stage IIIa (moderate) (HCC) -Likely due to #1, baseline creatinine 1.1-1.3 -Initially presented with creatinine of 1.38, has been trending up, 1.86 today -Start IV fluid  hydration     Factor 5 Leiden mutation, heterozygous (HCC), history of DVT -Eliquis currently on hold, awaiting ERCP, last dose on 2/18 AM -Will resume eliquis after the ERCP procedure -Will place on IV heparin drip for now, ERCP planned for tomorrow 2/20  History of metastatic colon cancer with metastatic lesion in the liver -Diagnosed with sigmoid colon cancer in 2023, s/p left hemicolectomy, solitary metastatic lesion in the liver pending ablation by Dr. [name] -Per wife, the ablation procedure has been rescheduled to 10/10/2023     Essential hypertension -BP currently stable, continue amlodipine, Coreg, hydralazine     Dyslipidemia -Hold statin due to transaminitis    GERD without esophagitis -Continue PPI    Type 2 diabetes mellitus, NIDDM with chronic kidney disease, without long-term current use of insulin (HCC)  -Hemoglobin A1c 6.1 on 09/07/2023 CBG (last 3)  Recent Labs    09/18/23 2124 09/18/23 2344 09/19/23 0825  GLUCAP 128* 127* 95   Continue SSI   Estimated body mass index is 28.7 kg/m as calculated from the following:   Height as of this encounter: 5\' 10"  (1.778 m).   Weight as of this encounter: 90.7 kg.  Code Status: Full code  DVT Prophylaxis:     Level of Care: Level of care: Med-Surg Family Communication: Updated patient's wife at the bedside. Disposition Plan:      Remains inpatient appropriate: Awaiting ERCP   Procedures:  MRCP  Consultants:   Gastroenterology  Antimicrobials:   Anti-infectives (From admission, onward)    Start     Dose/Rate Route Frequency Ordered Stop   09/18/23 1200  cefTRIAXone (ROCEPHIN) 2 g in sodium chloride 0.9 % 100 mL IVPB        2 g 200 mL/hr over 30 Minutes Intravenous Every 24 hours 09/18/23 1106     09/17/23 2200  vancomycin (VANCOREADY) IVPB 1250 mg/250 mL  Status:  Discontinued        1,250 mg 166.7 mL/hr over 90 Minutes Intravenous Every 24 hours 09/16/23 2247 09/18/23 0746   09/17/23 0700   ceFEPIme (MAXIPIME) 2 g in sodium chloride 0.9 % 100 mL IVPB  Status:  Discontinued        2 g 200 mL/hr over 30 Minutes Intravenous  Once 09/16/23 2154 09/16/23 2247   09/16/23 2315  vancomycin (VANCOCIN) IVPB 1000 mg/200 mL premix        1,000 mg 200 mL/hr over 60 Minutes Intravenous  Once 09/16/23 2154 09/17/23 0405   09/16/23 2300  ceFEPIme (MAXIPIME) 2 g in sodium chloride 0.9 % 100 mL IVPB  Status:  Discontinued        2 g 200 mL/hr over 30 Minutes Intravenous Every 12 hours 09/16/23 2247 09/18/23 1106   09/16/23 2200  metroNIDAZOLE (FLAGYL) IVPB 500 mg  Status:  Discontinued        500 mg 100 mL/hr over 60 Minutes Intravenous Every 12 hours 09/16/23 2154 09/18/23 1106   09/16/23 2115  cefTRIAXone (ROCEPHIN) 1 g in sodium chloride 0.9 % 100 mL IVPB        1 g 200 mL/hr over 30 Minutes Intravenous  Once 09/16/23 2114 09/16/23 2301   09/16/23 2115  metroNIDAZOLE (FLAGYL) IVPB 500 mg  Status:  Discontinued        500 mg 100 mL/hr over 60 Minutes Intravenous Every 12 hours 09/16/23 2114 09/16/23 2159          Medications  amitriptyline  10 mg Oral QPM   amLODipine  5 mg Oral BID   ascorbic acid  1,000 mg Oral QPM   carvedilol  6.25 mg Oral BID WC   cholecalciferol  5,000 Units Oral QPM   cyanocobalamin  1,000 mcg Oral QPM   dorzolamide  1 drop Left Eye BID   ferrous sulfate  325 mg Oral Q breakfast   Finerenone  10 mg Oral Daily   folic acid  1 mg Oral QPM   hydrALAZINE  50 mg Oral BID   insulin aspart  0-15 Units Subcutaneous TID WC   latanoprost  1 drop Both Eyes QHS   linagliptin  5 mg Oral q AM   loratadine  10 mg Oral q morning   pantoprazole  40 mg Oral BID AC   potassium chloride  40 mEq Oral BID      Subjective:   Luz Burcher was seen and examined today.  No acute complaints, awaiting ERCP.  Wife at the bedside.  Ambulating in the room, no acute complaints.  No fevers, nausea vomiting or abdominal pain.    Objective:   Vitals:   09/18/23 2041 09/18/23  2338 09/19/23 0430 09/19/23 0830  BP: (!) 133/58 (!) 140/59 (!) 111/48 (!) 117/54  Pulse:  Marland Kitchen)  55 60 (!) 57  Resp:  18 18 17   Temp:  98.4 F (36.9 C) 98 F (36.7 C) 98.4 F (36.9 C)  TempSrc:  Oral Oral Oral  SpO2:  97% 94% 96%  Weight:      Height:        Intake/Output Summary (Last 24 hours) at 09/19/2023 4540 Last data filed at 09/18/2023 2042 Gross per 24 hour  Intake 227.45 ml  Output --  Net 227.45 ml     Wt Readings from Last 3 Encounters:  09/16/23 90.7 kg  09/07/23 89.4 kg  08/02/23 91.5 kg     Exam General: Alert and oriented x 3, NAD Cardiovascular: S1 S2 auscultated,  RRR Respiratory: Clear to auscultation bilaterally, no wheezing Gastrointestinal: Soft, nontender, nondistended, + bowel sounds Ext: no pedal edema bilaterally Neuro: Strength 5/5 upper and lower extremities bilaterally Psych: Normal affect     Data Reviewed:  I have personally reviewed following labs    CBC Lab Results  Component Value Date   WBC 4.5 09/19/2023   RBC 2.88 (L) 09/19/2023   HGB 9.6 (L) 09/19/2023   HCT 28.1 (L) 09/19/2023   MCV 97.6 09/19/2023   MCH 33.3 09/19/2023   PLT 101 (L) 09/19/2023   MCHC 34.2 09/19/2023   RDW 13.2 09/19/2023   LYMPHSABS 0.5 (L) 09/19/2023   MONOABS 0.5 09/19/2023   EOSABS 0.1 09/19/2023   BASOSABS 0.0 09/19/2023     Last metabolic panel Lab Results  Component Value Date   NA 136 09/19/2023   K 3.6 09/19/2023   CL 110 09/19/2023   CO2 19 (L) 09/19/2023   BUN 23 09/19/2023   CREATININE 1.86 (H) 09/19/2023   GLUCOSE 103 (H) 09/19/2023   GFRNONAA 35 (L) 09/19/2023   CALCIUM 8.5 (L) 09/19/2023   PHOS 3.2 02/07/2023   PROT 5.1 (L) 09/19/2023   ALBUMIN 2.5 (L) 09/19/2023   LABGLOB 2.7 01/25/2022   AGRATIO 1.3 01/25/2022   BILITOT 2.4 (H) 09/19/2023   ALKPHOS 195 (H) 09/19/2023   AST 95 (H) 09/19/2023   ALT 141 (H) 09/19/2023   ANIONGAP 7 09/19/2023    CBG (last 3)  Recent Labs    09/18/23 2124 09/18/23 2344  09/19/23 0825  GLUCAP 128* 127* 95      Coagulation Profile: Recent Labs  Lab 09/17/23 0428  INR 1.6*     Radiology Studies: I have personally reviewed the imaging studies  MR ABDOMEN MRCP WO CONTRAST Result Date: 09/18/2023 CLINICAL DATA:  Biliary obstruction suspected metastatic colon cancer EXAM: MRI ABDOMEN WITHOUT CONTRAST  (INCLUDING MRCP) TECHNIQUE: Multiplanar multisequence MR imaging of the abdomen was performed. Heavily T2-weighted images of the biliary and pancreatic ducts were obtained, and three-dimensional MRCP images were rendered by post processing. COMPARISON:  Right upper quadrant ultrasound, 09/17/2023 CT chest angiogram, 09/16/2023, PET-CT, 07/28/2023 FINDINGS: Lower chest: Trace pleural effusions. Hepatobiliary: Metastasis of the inferior right lobe of the liver, hepatic segment VI (series 3, image 23). Sludge in the gallbladder. Mild gallbladder wall thickening. Adjacent gallstones in the central common bile duct measuring 1.0 and 0.7 cm (series 4, image 15). Common bile duct measures up to 1.1 cm in caliber within the pancreatic head with mild intrahepatic biliary ductal dilatation. Pancreas: Unremarkable. No pancreatic ductal dilatation or surrounding inflammatory changes. Spleen: Normal in size without significant abnormality. Adrenals/Urinary Tract: Adrenal glands are unremarkable. Numerous bilateral renal cortical and parapelvic cysts, some of which are septated, requiring no specific further follow-up or characterization. No calculi or hydronephrosis. Stomach/Bowel:  Stomach is within normal limits. No evidence of bowel wall thickening, distention, or inflammatory changes. Vascular/Lymphatic: No significant vascular findings are present. No enlarged abdominal lymph nodes. Other: No abdominal wall hernia.  Mild anasarca.  Trace ascites. Musculoskeletal: No acute or significant osseous findings. IMPRESSION: 1. Choledocholithiasis with adjacent gallstones in the central common  bile duct measuring 1.0 and 0.7 cm. Common bile duct measures up to 1.1 cm in caliber within the pancreatic head with mild intrahepatic biliary ductal dilatation. 2. Sludge in the gallbladder. Mild gallbladder wall thickening. 3. Known metastasis of the inferior right lobe of the liver, hepatic segment VI. 4. Trace pleural effusions, trace ascites, and mild anasarca. These results will be called to the ordering clinician or representative by the Radiologist Assistant, and communication documented in the PACS or [employer]. Electronically Signed   By: Jearld Lesch M.D.   On: 09/18/2023 11:03   MR 3D Recon At Scanner Result Date: 09/18/2023 CLINICAL DATA:  Biliary obstruction suspected metastatic colon cancer EXAM: MRI ABDOMEN WITHOUT CONTRAST  (INCLUDING MRCP) TECHNIQUE: Multiplanar multisequence MR imaging of the abdomen was performed. Heavily T2-weighted images of the biliary and pancreatic ducts were obtained, and three-dimensional MRCP images were rendered by post processing. COMPARISON:  Right upper quadrant ultrasound, 09/17/2023 CT chest angiogram, 09/16/2023, PET-CT, 07/28/2023 FINDINGS: Lower chest: Trace pleural effusions. Hepatobiliary: Metastasis of the inferior right lobe of the liver, hepatic segment VI (series 3, image 23). Sludge in the gallbladder. Mild gallbladder wall thickening. Adjacent gallstones in the central common bile duct measuring 1.0 and 0.7 cm (series 4, image 15). Common bile duct measures up to 1.1 cm in caliber within the pancreatic head with mild intrahepatic biliary ductal dilatation. Pancreas: Unremarkable. No pancreatic ductal dilatation or surrounding inflammatory changes. Spleen: Normal in size without significant abnormality. Adrenals/Urinary Tract: Adrenal glands are unremarkable. Numerous bilateral renal cortical and parapelvic cysts, some of which are septated, requiring no specific further follow-up or characterization. No calculi or hydronephrosis.  Stomach/Bowel: Stomach is within normal limits. No evidence of bowel wall thickening, distention, or inflammatory changes. Vascular/Lymphatic: No significant vascular findings are present. No enlarged abdominal lymph nodes. Other: No abdominal wall hernia.  Mild anasarca.  Trace ascites. Musculoskeletal: No acute or significant osseous findings. IMPRESSION: 1. Choledocholithiasis with adjacent gallstones in the central common bile duct measuring 1.0 and 0.7 cm. Common bile duct measures up to 1.1 cm in caliber within the pancreatic head with mild intrahepatic biliary ductal dilatation. 2. Sludge in the gallbladder. Mild gallbladder wall thickening. 3. Known metastasis of the inferior right lobe of the liver, hepatic segment VI. 4. Trace pleural effusions, trace ascites, and mild anasarca. These results will be called to the ordering clinician or representative by the Radiologist Assistant, and communication documented in the PACS or [employer]. Electronically Signed   By: Jearld Lesch M.D.   On: 09/18/2023 11:03   US Abdomen Limited RUQ (LIVER/GB) Result Date: 09/17/2023 CLINICAL DATA:  Elevated LFTs EXAM: ULTRASOUND ABDOMEN LIMITED RIGHT UPPER QUADRANT COMPARISON:  CT C AP 09/16/2023 FINDINGS: Gallbladder: Sludge in the gallbladder lumen. No gallbladder wall thickening or pericholecystic fluid. Negative sonographic Murphy's sign. Common bile duct: Diameter: 11 mm, dilated. Liver: Known liver lesion not well assessed on current exam. Increased parenchymal echogenicity. Portal vein is patent on color Doppler imaging with normal direction of blood flow towards the liver. Other: None. IMPRESSION: 1. Sludge in the gallbladder lumen. No secondary signs to suggest acute cholecystitis. 2. Dilated common bile duct. Recommend correlation with LFTs. If  there is concern for biliary obstruction, recommend MRI/MRCP. 3. Known liver lesion not well assessed on current exam. 4. Increased hepatic parenchymal echogenicity  suggestive of steatosis. Electronically Signed   By: Annia Belt M.D.   On: 09/17/2023 12:56       Marian Grandt M.D. [facility] 09/19/2023, 9:11 AM  Available via Epic secure chat 7am-7pm After 7 pm, please refer to night coverage provider listed on amion.

## 2023-09-20 ENCOUNTER — Inpatient Hospital Stay (HOSPITAL_COMMUNITY): Payer: Self-pay | Admitting: Anesthesiology

## 2023-09-20 ENCOUNTER — Encounter (HOSPITAL_COMMUNITY)
Admission: EM | Disposition: A | Discharge status: Home / Self Care | Payer: Self-pay | Source: Home / Self Care | Attending: Internal Medicine

## 2023-09-20 ENCOUNTER — Inpatient Hospital Stay (HOSPITAL_COMMUNITY): Payer: Medicare Other

## 2023-09-20 ENCOUNTER — Encounter (HOSPITAL_COMMUNITY): Payer: Self-pay | Admitting: Student in an Organized Health Care Education/Training Program

## 2023-09-20 DIAGNOSIS — R0789 Other chest pain: Secondary | ICD-10-CM | POA: Diagnosis not present

## 2023-09-20 DIAGNOSIS — D6851 Activated protein C resistance: Secondary | ICD-10-CM | POA: Diagnosis not present

## 2023-09-20 DIAGNOSIS — E1151 Type 2 diabetes mellitus with diabetic peripheral angiopathy without gangrene: Secondary | ICD-10-CM | POA: Diagnosis not present

## 2023-09-20 DIAGNOSIS — E785 Hyperlipidemia, unspecified: Secondary | ICD-10-CM | POA: Diagnosis not present

## 2023-09-20 DIAGNOSIS — I10 Essential (primary) hypertension: Secondary | ICD-10-CM

## 2023-09-20 DIAGNOSIS — K838 Other specified diseases of biliary tract: Secondary | ICD-10-CM | POA: Diagnosis not present

## 2023-09-20 DIAGNOSIS — K315 Obstruction of duodenum: Secondary | ICD-10-CM | POA: Diagnosis not present

## 2023-09-20 DIAGNOSIS — K8020 Calculus of gallbladder without cholecystitis without obstruction: Secondary | ICD-10-CM

## 2023-09-20 DIAGNOSIS — K8050 Calculus of bile duct without cholangitis or cholecystitis without obstruction: Secondary | ICD-10-CM

## 2023-09-20 HISTORY — PX: SPHINCTEROTOMY: SHX5279

## 2023-09-20 HISTORY — PX: REMOVAL OF STONES: SHX5545

## 2023-09-20 HISTORY — PX: SPHINCTEROTOMY: SHX5544

## 2023-09-20 HISTORY — PX: ENDOSCOPIC RETROGRADE CHOLANGIOPANCREATOGRAPHY (ERCP) WITH PROPOFOL: SHX5810

## 2023-09-20 LAB — CBC WITH DIFFERENTIAL/PLATELET
Abs Immature Granulocytes: 0.01 10*3/uL (ref 0.00–0.07)
Basophils Absolute: 0 10*3/uL (ref 0.0–0.1)
Basophils Relative: 1 %
Eosinophils Absolute: 0.2 10*3/uL (ref 0.0–0.5)
Eosinophils Relative: 3 %
HCT: 31.5 % — ABNORMAL LOW (ref 39.0–52.0)
Hemoglobin: 10.8 g/dL — ABNORMAL LOW (ref 13.0–17.0)
Immature Granulocytes: 0 %
Lymphocytes Relative: 16 %
Lymphs Abs: 0.9 10*3/uL (ref 0.7–4.0)
MCH: 32.9 pg (ref 26.0–34.0)
MCHC: 34.3 g/dL (ref 30.0–36.0)
MCV: 96 fL (ref 80.0–100.0)
Monocytes Absolute: 0.8 10*3/uL (ref 0.1–1.0)
Monocytes Relative: 14 %
Neutro Abs: 3.8 10*3/uL (ref 1.7–7.7)
Neutrophils Relative %: 66 %
Platelets: 128 10*3/uL — ABNORMAL LOW (ref 150–400)
RBC: 3.28 MIL/uL — ABNORMAL LOW (ref 4.22–5.81)
RDW: 13.5 % (ref 11.5–15.5)
WBC: 5.7 10*3/uL (ref 4.0–10.5)
nRBC: 0 % (ref 0.0–0.2)

## 2023-09-20 LAB — COMPREHENSIVE METABOLIC PANEL
ALT: 126 U/L — ABNORMAL HIGH (ref 0–44)
AST: 92 U/L — ABNORMAL HIGH (ref 15–41)
Albumin: 2.5 g/dL — ABNORMAL LOW (ref 3.5–5.0)
Alkaline Phosphatase: 262 U/L — ABNORMAL HIGH (ref 38–126)
Anion gap: 8 (ref 5–15)
BUN: 15 mg/dL (ref 8–23)
CO2: 21 mmol/L — ABNORMAL LOW (ref 22–32)
Calcium: 9 mg/dL (ref 8.9–10.3)
Chloride: 109 mmol/L (ref 98–111)
Creatinine, Ser: 1.34 mg/dL — ABNORMAL HIGH (ref 0.61–1.24)
GFR, Estimated: 52 mL/min — ABNORMAL LOW (ref >60)
Glucose, Bld: 105 mg/dL — ABNORMAL HIGH (ref 70–99)
Potassium: 3.9 mmol/L (ref 3.5–5.1)
Sodium: 138 mmol/L (ref 135–145)
Total Bilirubin: 2.2 mg/dL — ABNORMAL HIGH (ref 0.0–1.2)
Total Protein: 5.4 g/dL — ABNORMAL LOW (ref 6.5–8.1)

## 2023-09-20 LAB — GLUCOSE, CAPILLARY
Glucose-Capillary: 107 mg/dL — ABNORMAL HIGH (ref 70–99)
Glucose-Capillary: 111 mg/dL — ABNORMAL HIGH (ref 70–99)
Glucose-Capillary: 181 mg/dL — ABNORMAL HIGH (ref 70–99)
Glucose-Capillary: 230 mg/dL — ABNORMAL HIGH (ref 70–99)

## 2023-09-20 SURGERY — ENDOSCOPIC RETROGRADE CHOLANGIOPANCREATOGRAPHY (ERCP) WITH PROPOFOL
Anesthesia: General

## 2023-09-20 MED ORDER — INDOMETHACIN 50 MG RE SUPP: 100.0000 mg | Freq: Once | RECTAL | Status: DC

## 2023-09-20 MED ORDER — ROCURONIUM BROMIDE 10 MG/ML (PF) SYRINGE
PREFILLED_SYRINGE | INTRAVENOUS | Status: DC | PRN
  Administered 2023-09-20: 50 mg via INTRAVENOUS

## 2023-09-20 MED ORDER — HEPARIN (PORCINE) 25000 UT/250ML-% IV SOLN
1200.0000 [IU]/h | INTRAVENOUS | Status: DC
  Administered 2023-09-20: 1300 [IU]/h via INTRAVENOUS
  Filled 2023-09-20 (×2): qty 250

## 2023-09-20 MED ORDER — SODIUM CHLORIDE 0.9 % IV SOLN
INTRAVENOUS | Status: DC | PRN
  Administered 2023-09-20: 60 mL

## 2023-09-20 MED ORDER — INDOMETHACIN 50 MG RE SUPP
RECTAL | Status: AC
  Filled 2023-09-20: qty 2

## 2023-09-20 MED ORDER — DEXAMETHASONE SODIUM PHOSPHATE 10 MG/ML IJ SOLN
INTRAMUSCULAR | Status: DC | PRN
Start: 2023-09-20 — End: 2023-09-20
  Administered 2023-09-20: 5 mg via INTRAVENOUS

## 2023-09-20 MED ORDER — EPHEDRINE SULFATE-NACL 50-0.9 MG/10ML-% IV SOSY
PREFILLED_SYRINGE | INTRAVENOUS | Status: DC | PRN
  Administered 2023-09-20 (×4): 5 mg via INTRAVENOUS
  Administered 2023-09-20: 10 mg via INTRAVENOUS

## 2023-09-20 MED ORDER — LACTATED RINGERS IV SOLN: INTRAVENOUS | Status: DC | PRN

## 2023-09-20 MED ORDER — PHENYLEPHRINE 80 MCG/ML (10ML) SYRINGE FOR IV PUSH (FOR BLOOD PRESSURE SUPPORT)
PREFILLED_SYRINGE | INTRAVENOUS | Status: DC | PRN
  Administered 2023-09-20 (×2): 80 ug via INTRAVENOUS
  Administered 2023-09-20: 40 ug via INTRAVENOUS

## 2023-09-20 MED ORDER — INDOMETHACIN 50 MG RE SUPP
RECTAL | Status: DC | PRN
  Administered 2023-09-20: 100 mg via RECTAL

## 2023-09-20 MED ORDER — PROPOFOL 10 MG/ML IV BOLUS
INTRAVENOUS | Status: DC | PRN
  Administered 2023-09-20: 100 mg via INTRAVENOUS
  Administered 2023-09-20: 50 mg via INTRAVENOUS

## 2023-09-20 MED ORDER — ONDANSETRON HCL 4 MG/2ML IJ SOLN
INTRAMUSCULAR | Status: DC | PRN
  Administered 2023-09-20: 4 mg via INTRAVENOUS

## 2023-09-20 MED ORDER — SUGAMMADEX SODIUM 200 MG/2ML IV SOLN
INTRAVENOUS | Status: DC | PRN
  Administered 2023-09-20: 200 mg via INTRAVENOUS

## 2023-09-20 MED ORDER — CEFAZOLIN SODIUM-DEXTROSE 2-4 GM/100ML-% IV SOLN
2.0000 g | Freq: Three times a day (TID) | INTRAVENOUS | Status: DC
  Administered 2023-09-20 – 2023-09-21 (×4): 2 g via INTRAVENOUS
  Filled 2023-09-20 (×4): qty 100

## 2023-09-20 MED ORDER — LIDOCAINE 2% (20 MG/ML) 5 ML SYRINGE
INTRAMUSCULAR | Status: DC | PRN
  Administered 2023-09-20: 100 mg via INTRAVENOUS
  Administered 2023-09-20: 40 mg via INTRAVENOUS

## 2023-09-20 MED ORDER — GLUCAGON HCL RDNA (DIAGNOSTIC) 1 MG IJ SOLR
INTRAMUSCULAR | Status: AC
  Filled 2023-09-20: qty 1

## 2023-09-20 MED ORDER — GLUCAGON HCL RDNA (DIAGNOSTIC) 1 MG IJ SOLR
INTRAMUSCULAR | Status: DC | PRN
Start: 2023-09-20 — End: 2023-09-20
  Administered 2023-09-20 (×2): .25 mg via INTRAVENOUS

## 2023-09-20 NOTE — Consult Note (Signed)
Herbert Wood May 06, 1935  [id number].    Requesting MD: Dr. Thad Ranger Chief Complaint/Reason for Consult: choledocholithiasis  HPI:  This is a very pleasant 89 yo male with a history of metastatic colon cancer with a met in the liver (IR ablation x1 and repeat supposed to be this week), Factor V Leiden's deficiency with history of DVT on Eliquis (last dose Monday), DM, HTN, and HLD who present to the ED at [facility] on Sunday secondary to an acute onset of chest pressure/pain.  By the time he got to the ED, the chest pain had improved, but now he had some abdominal pain radiating to his back with a fever and malaise.  His CTA was normal, but his LFTs were elevated some.  They have been elevated due to his liver met likely, so he was slated for DC home.  Unfortunately, he was so weak by this time that he was unable to be discharged.  Further work up and observation revealed worsening elevation in his LFTs.  He underwent an MRCP which revealed choledocholithiasis, otherwise sludge in the gallbladder, but no real evidence for cholecystitis.  His WBC is normal.  His pain has resolved since Sunday.  He has been tx to [facility] for GI for ERCP, which is scheduled for today.  We have been asked to see him for discussion regarding lap chole.  Of note, prior to this episode, the patient has not had any definitive biliary colic symptoms such as post prandial pain or n/v.  He has been eating, albeit, his wife states that she feels his appetite has been slightly less recently with some belching.  In December he had some back pain that he has been seeing PT for.  He has some chronic MSK type back pain.  She wasn't sure if some of this could've been related to his gallbladder.    ROS: ROS: see HPI  History reviewed. No pertinent family history.  Past Medical History:  Diagnosis Date   Adenomatous colon polyp    Anemia    Carotid bruit    Chronic dyspnea    Diabetes (HCC)    type 2   Diverticular disease     DVT (deep venous thrombosis) (HCC)    lower limb   Dyspnea    Factor V deficiency (HCC)    Hearing loss    bilateral   Hyperlipemia    Hypertension    Inguinal hernia    Kidney disease    stage 3   Lung nodule    Lymphadenopathy    Malignant neoplasm (HCC)    salivary gland   Murmur    Osteoarthritis    Senile purpura (HCC)     Past Surgical History:  Procedure Laterality Date   BIOPSY  01/10/2022   Procedure: BIOPSY;  Surgeon: Dolores Frame, MD;  [facility];  Service: Gastroenterology;;   COLON SURGERY     COLONOSCOPY WITH PROPOFOL N/A 01/10/2022   Procedure: COLONOSCOPY WITH PROPOFOL;  Surgeon: Dolores Frame, MD;  [facility];  Service: Gastroenterology;  Laterality: N/A;  per Soledad Gerlach, pt to arrive at 9:15   COLONOSCOPY WITH PROPOFOL N/A 02/27/2023   Procedure: COLONOSCOPY WITH PROPOFOL;  Surgeon: Dolores Frame, MD;  [facility];  Service: Gastroenterology;  Laterality: N/A;  9:15am;asa 3   ESOPHAGOGASTRODUODENOSCOPY (EGD) WITH PROPOFOL N/A 01/10/2022   Procedure: ESOPHAGOGASTRODUODENOSCOPY (EGD) WITH PROPOFOL;  Surgeon: Dolores Frame, MD;  [facility];  Service: Gastroenterology;  Laterality: N/A;   HAND / FINGER LESION EXCISION     HERNIA REPAIR     IR RADIOLOGIST EVAL & MGMT  04/04/2023   IR RADIOLOGIST EVAL & MGMT  08/09/2023   MASTOIDECTOMY     PILONIDAL CYST EXCISION     POLYPECTOMY  01/10/2022   Procedure: POLYPECTOMY;  Surgeon: Dolores Frame, MD;  [facility];  Service: Gastroenterology;;   POLYPECTOMY  02/27/2023   Procedure: POLYPECTOMY;  Surgeon: Dolores Frame, MD;  [facility];  Service: Gastroenterology;;   RADIOLOGY WITH ANESTHESIA N/A 12/27/2022   Procedure: CT microwave ablation of the liver;  Surgeon: Irish Lack, MD;  Location: [facility];  Service: Radiology;  Laterality: N/A;   SUBMUCOSAL TATTOO INJECTION   01/10/2022   Procedure: SUBMUCOSAL TATTOO INJECTION;  Surgeon: Dolores Frame, MD;  [facility]: [facility];  Service: Gastroenterology;;   SURGERY OF LIP     skin cancer    Social History:  reports that he quit smoking about 30 years ago. His smoking use included cigarettes. He started smoking about 55 years ago. He has a 50 pack-year smoking history. He has been exposed to tobacco smoke. He has never used smokeless tobacco. He reports that he does not drink alcohol and does not use drugs.  Allergies:  Allergies  Allergen Reactions   Gadobenate Nausea And Vomiting    Immediately upon the infusion of 15mL multihance contrast  Patient had exteme nausea and vomiting.   No other symptoms noted .  No injury.  MRI scan was completed after patient felt better.   Immediately upon the infusion of 15mL multihance contrast  Patient had exteme nausea and vomiting.   No other symptoms noted .  No injury.  MRI scan was completed after patient felt better.       Medications Prior to Admission  Medication Sig Dispense Refill   amitriptyline (ELAVIL) 10 MG tablet Take 10 mg by mouth every evening.     amLODipine (NORVASC) 5 MG tablet Take 5 mg by mouth 2 (two) times daily.     ascorbic acid (VITAMIN C/NATURAL ROSE HIPS) 1000 MG tablet Take 1,000 mg by mouth every evening.     atorvastatin (LIPITOR) 40 MG tablet Take 40 mg by mouth every evening.     carvedilol (COREG) 6.25 MG tablet Take 6.25 mg by mouth 2 (two) times daily with a meal.     Cholecalciferol (VITAMIN D3) 125 MCG (5000 UT) CAPS Take 5,000 Units by mouth every evening.     dorzolamide (TRUSOPT) 2 % ophthalmic solution Place 1 drop into the left eye 2 (two) times daily.     ELIQUIS 5 MG TABS tablet TAKE 1 TABLET(5 MG) BY MOUTH TWICE DAILY 60 tablet 3   ferrous sulfate 325 (65 FE) MG tablet Take 325 mg by mouth daily with breakfast.     folic acid (FOLVITE) 800 MCG tablet Take 800 mcg by mouth every evening. With B12 Now (Brand)      hydrALAZINE (APRESOLINE) 25 MG tablet Take 50 mg by mouth in the morning and at bedtime.     HYDROcodone-acetaminophen (NORCO) 5-325 MG tablet Take 1 tablet by mouth every 8 (eight) hours as needed for moderate pain (pain score 4-6). 30 tablet 0   KERENDIA 10 MG TABS Take 10 mg by mouth daily.     latanoprost (XALATAN) 0.005 % ophthalmic solution Place 1 drop into both eyes at bedtime.     linagliptin (TRADJENTA)  5 MG TABS tablet Take 5 mg by mouth in the morning.     loratadine (CLARITIN) 10 MG tablet Take 10 mg by mouth every morning.     Misc Natural Products (PROSTATE SUPPORT PO) Take 1 capsule by mouth every evening. NOW Prostate Support     omeprazole (PRILOSEC) 40 MG capsule Take 1 capsule (40 mg total) by mouth daily. 90 capsule 3   tobramycin (TOBREX) 0.3 % ophthalmic solution INSTILL 5 DROPS TO RIGHT EAR TWICE DAILY FOR 7 DAYS     traMADol (ULTRAM) 50 MG tablet Take 1 tablet (50 mg total) by mouth every 6 (six) hours as needed. 30 tablet 0   predniSONE (DELTASONE) 50 MG tablet Take 1 tablet 13 hours prior to scan, 7 hours prior to scan and 1 hour prior to scan for contrast allergy Take 50 mg Benadryl 1 hour prior to scan 3 tablet 0   sucralfate (CARAFATE) 1 GM/10ML suspension Take 10 mLs (1 g total) by mouth 4 (four) times daily. (Patient not taking: Reported on 09/04/2023) 420 mL 1   vitamin B-12 (CYANOCOBALAMIN) 1000 MCG tablet Take 1,000 mcg by mouth every evening. Now (Brand)       Physical Exam: Blood pressure 134/63, pulse 68, temperature 98.1 F (36.7 C), temperature source Tympanic, resp. rate 14, height 5\' 10"  (1.778 m), weight 90.7 kg, SpO2 96%. General: pleasant, WD, WN, adlerly white male who is laying in bed in NAD HEENT: head is normocephalic, atraumatic.  Sclera are noninjected, but icteric.  PERRL.  Ears and nose without any masses or lesions.  Mouth is pink and moist Heart: regular, rate, and rhythm.  Normal s1,s2. No obvious gallops, or rubs noted. +murmur.   Lungs:  CTAB, no wheezes, rhonchi, or rales noted.  Respiratory effort nonlabored Abd: soft, NT, ND, +BS, no masses or organomegaly.  Incisional hernia noted just superior to his umbilicus.  This is soft and reducible. Skin: warm and dry with no masses, lesions, or rashes.  Jaundice is present Psych: A&Ox3 with an appropriate affect.   Results for orders placed or performed during the hospital encounter of 09/16/23 (from the past 48 hours)  Glucose, capillary     Status: Abnormal   Collection Time: 09/18/23  4:22 PM  Result Value Ref Range   Glucose-Capillary 106 (H) 70 - 99 mg/dL    Comment: Glucose reference range applies only to samples taken after fasting for at least 8 hours.  Glucose, capillary     Status: Abnormal   Collection Time: 09/18/23  9:24 PM  Result Value Ref Range   Glucose-Capillary 128 (H) 70 - 99 mg/dL    Comment: Glucose reference range applies only to samples taken after fasting for at least 8 hours.  Glucose, capillary     Status: Abnormal   Collection Time: 09/18/23 11:44 PM  Result Value Ref Range   Glucose-Capillary 127 (H) 70 - 99 mg/dL    Comment: Glucose reference range applies only to samples taken after fasting for at least 8 hours.  CBC with Differential/Platelet     Status: Abnormal   Collection Time: 09/19/23  6:24 AM  Result Value Ref Range   WBC 4.5 4.0 - 10.5 K/uL   RBC 2.88 (L) 4.22 - 5.81 MIL/uL   Hemoglobin 9.6 (L) 13.0 - 17.0 g/dL   HCT 72.5 (L) 36.6 - 44.0 %   MCV 97.6 80.0 - 100.0 fL   MCH 33.3 26.0 - 34.0 pg   MCHC 34.2 30.0 - 36.0 g/dL  RDW 13.2 11.5 - 15.5 %   Platelets 101 (L) 150 - 400 K/uL    Comment: Immature Platelet Fraction may be clinically indicated, consider ordering this additional test [id number] REPEATED TO VERIFY    nRBC 0.0 0.0 - 0.2 %   Neutrophils Relative % 76 %   Neutro Abs 3.4 1.7 - 7.7 K/uL   Lymphocytes Relative 10 %   Lymphs Abs 0.5 (L) 0.7 - 4.0 K/uL   Monocytes Relative 11 %   Monocytes Absolute 0.5 0.1 - 1.0  K/uL   Eosinophils Relative 3 %   Eosinophils Absolute 0.1 0.0 - 0.5 K/uL   Basophils Relative 0 %   Basophils Absolute 0.0 0.0 - 0.1 K/uL   Immature Granulocytes 0 %   Abs Immature Granulocytes 0.01 0.00 - 0.07 K/uL    Comment: Performed at [facility], [address], [place], [place] [address]  Comprehensive metabolic panel     Status: Abnormal   Collection Time: 09/19/23  6:24 AM  Result Value Ref Range   Sodium 136 135 - 145 mmol/L   Potassium 3.6 3.5 - 5.1 mmol/L   Chloride 110 98 - 111 mmol/L   CO2 19 (L) 22 - 32 mmol/L   Glucose, Bld 103 (H) 70 - 99 mg/dL    Comment: Glucose reference range applies only to samples taken after fasting for at least 8 hours.   BUN 23 8 - 23 mg/dL   Creatinine, Ser 2.53 (H) 0.61 - 1.24 mg/dL   Calcium 8.5 (L) 8.9 - 10.3 mg/dL   Total Protein 5.1 (L) 6.5 - 8.1 g/dL   Albumin 2.5 (L) 3.5 - 5.0 g/dL   AST 95 (H) 15 - 41 U/L   ALT 141 (H) 0 - 44 U/L   Alkaline Phosphatase 195 (H) 38 - 126 U/L   Total Bilirubin 2.4 (H) 0.0 - 1.2 mg/dL   GFR, Estimated 35 (L) >60 mL/min    Comment: (NOTE) Calculated using the CKD-EPI Creatinine Equation (2021)    Anion gap 7 5 - 15    Comment: Performed at [facility], [address], [place], [place] [address]  Glucose, capillary     Status: None   Collection Time: 09/19/23  8:25 AM  Result Value Ref Range   Glucose-Capillary 95 70 - 99 mg/dL    Comment: Glucose reference range applies only to samples taken after fasting for at least 8 hours.  Culture, blood (Routine X 2) w Reflex to ID Panel     Status: None (Preliminary result)   Collection Time: 09/19/23 10:12 AM   Specimen: BLOOD RIGHT HAND  Result Value Ref Range   Specimen Description BLOOD RIGHT HAND    Special Requests      BOTTLES DRAWN AEROBIC AND ANAEROBIC Blood Culture results may not be optimal due to an inadequate volume of blood received in culture bottles   Culture      NO GROWTH < 24 HOURS Performed at [facility]  Lab, [address] [place], [place] [address]    Report Status PENDING   Culture, blood (Routine X 2) w Reflex to ID Panel     Status: None (Preliminary result)   Collection Time: 09/19/23 10:12 AM   Specimen: BLOOD LEFT ARM  Result Value Ref Range   Specimen Description BLOOD LEFT ARM    Special Requests      BOTTLES DRAWN AEROBIC AND ANAEROBIC Blood Culture results may not be optimal due to an inadequate  volume of blood received in culture bottles   Culture      NO GROWTH < 24 HOURS Performed at [facility], [address], [place], [place] [address]    Report Status PENDING   Glucose, capillary     Status: Abnormal   Collection Time: 09/19/23 11:51 AM  Result Value Ref Range   Glucose-Capillary 177 (H) 70 - 99 mg/dL    Comment: Glucose reference range applies only to samples taken after fasting for at least 8 hours.  Glucose, capillary     Status: Abnormal   Collection Time: 09/19/23  3:54 PM  Result Value Ref Range   Glucose-Capillary 148 (H) 70 - 99 mg/dL    Comment: Glucose reference range applies only to samples taken after fasting for at least 8 hours.  APTT     Status: Abnormal   Collection Time: 09/19/23  6:19 PM  Result Value Ref Range   aPTT 77 (H) 24 - 36 seconds    Comment:        IF BASELINE aPTT IS ELEVATED, SUGGEST PATIENT RISK ASSESSMENT BE USED TO DETERMINE APPROPRIATE ANTICOAGULANT THERAPY. Performed at [facility], [address] [place], [place] [address]   Glucose, capillary     Status: Abnormal   Collection Time: 09/19/23  9:24 PM  Result Value Ref Range   Glucose-Capillary 141 (H) 70 - 99 mg/dL    Comment: Glucose reference range applies only to samples taken after fasting for at least 8 hours.  Comprehensive metabolic panel     Status: Abnormal   Collection Time: 09/20/23  6:27 AM  Result Value Ref Range   Sodium 138 135 - 145 mmol/L   Potassium 3.9 3.5 - 5.1 mmol/L   Chloride 109 98 - 111 mmol/L   CO2 21 (L) 22 - 32 mmol/L    Glucose, Bld 105 (H) 70 - 99 mg/dL    Comment: Glucose reference range applies only to samples taken after fasting for at least 8 hours.   BUN 15 8 - 23 mg/dL   Creatinine, Ser 1.30 (H) 0.61 - 1.24 mg/dL   Calcium 9.0 8.9 - 86.5 mg/dL   Total Protein 5.4 (L) 6.5 - 8.1 g/dL   Albumin 2.5 (L) 3.5 - 5.0 g/dL   AST 92 (H) 15 - 41 U/L   ALT 126 (H) 0 - 44 U/L   Alkaline Phosphatase 262 (H) 38 - 126 U/L   Total Bilirubin 2.2 (H) 0.0 - 1.2 mg/dL   GFR, Estimated 52 (L) >60 mL/min    Comment: (NOTE) Calculated using the CKD-EPI Creatinine Equation (2021)    Anion gap 8 5 - 15    Comment: Performed at [facility], [address] [place], [place] [address]  CBC with Differential/Platelet     Status: Abnormal   Collection Time: 09/20/23  6:27 AM  Result Value Ref Range   WBC 5.7 4.0 - 10.5 K/uL   RBC 3.28 (L) 4.22 - 5.81 MIL/uL   Hemoglobin 10.8 (L) 13.0 - 17.0 g/dL   HCT 62.9 (L) 52.8 - 41.3 %   MCV 96.0 80.0 - 100.0 fL   MCH 32.9 26.0 - 34.0 pg   MCHC 34.3 30.0 - 36.0 g/dL   RDW 24.4 01.0 - 27.2 %   Platelets 128 (L) 150 - 400 K/uL    Comment: REPEATED TO VERIFY   nRBC 0.0 0.0 - 0.2 %   Neutrophils Relative % 66 %   Neutro Abs  3.8 1.7 - 7.7 K/uL   Lymphocytes Relative 16 %   Lymphs Abs 0.9 0.7 - 4.0 K/uL   Monocytes Relative 14 %   Monocytes Absolute 0.8 0.1 - 1.0 K/uL   Eosinophils Relative 3 %   Eosinophils Absolute 0.2 0.0 - 0.5 K/uL   Basophils Relative 1 %   Basophils Absolute 0.0 0.0 - 0.1 K/uL   Immature Granulocytes 0 %   Abs Immature Granulocytes 0.01 0.00 - 0.07 K/uL    Comment: Performed at [facility], [address], [place], [place] [address]  Glucose, capillary     Status: Abnormal   Collection Time: 09/20/23  6:57 AM  Result Value Ref Range   Glucose-Capillary 107 (H) 70 - 99 mg/dL    Comment: Glucose reference range applies only to samples taken after fasting for at least 8 hours.  Glucose, capillary     Status: Abnormal   Collection Time:  09/20/23  8:05 AM  Result Value Ref Range   Glucose-Capillary 111 (H) 70 - 99 mg/dL    Comment: Glucose reference range applies only to samples taken after fasting for at least 8 hours.   No results found.    Assessment/Plan Choledocholithiasis with possible cholangitis in setting of stage IV colon cancer with liver and pulmonary mets The patient has been seen, examined, labs, vitals, chart, and imaging reviewed.  The patient tells me that he has not really had any definitive biliary symptoms prior to this incident.  He is scheduled for ERCP for stone extraction and likely sphincterotomy today.  His MRCP does not show evidence of cholecystitis and sludge remains with no other significant stones visible.  Given the proximity of his liver lesion to his gallbladder (risk of hemorrhage, spread), need for ablation, only sludge remains, incisional hernia with a short torso and tight working space, this would likely be a technically challenging surgical case that puts the patient at risk for some fairly significant complications.  Given he will likely get a sphincterotomy with ERCP today and no previous biliary colic, it is likely the patient may not have any further issues with his gallbladder.  The patient nor his wife are really interested in surgery and would like to avoid surgery.  I think this is very reasonable given the above information.  We discussed that if he were to develop symptoms then we could discuss possible surgical intervention, but given potential risk with met this close to the gallbladder, risks may outweigh the benefit.  We will continue to follow and see how he does post ERCP, but no plans for surgical intervention at this time.   FEN - diet per GI post ERCP VTE - Eliquis on hold ID - ancef  Klebsiella bacteremia - likely secondary to cholangitis.  Ancef, per primary Reducible incisional hernia HTN HLD Factor V Leiden's H/O RLE DVT - on Eliquis DM  I reviewed Consultant GI  notes, hospitalist notes, last 24 h vitals and pain scores, last 48 h intake and output, last 24 h labs and trends, and last 24 h imaging results.  Letha Cape, [facility] 09/20/2023, 12:36 PM Please see Amion for pager number during day hours 7:00am-4:30pm or 7:00am -11:30am on weekends

## 2023-09-20 NOTE — Anesthesia Procedure Notes (Signed)
Procedure Name: Intubation Date/Time: 09/20/2023 1:04 PM  Performed by: Colbert Coyer, CRNAPre-anesthesia Checklist: Patient identified, Emergency Drugs available, Suction available and Patient being monitored Patient Re-evaluated:Patient Re-evaluated prior to induction Oxygen Delivery Method: Circle System Utilized Preoxygenation: Pre-oxygenation with 100% oxygen Induction Type: IV induction Ventilation: Mask ventilation without difficulty Laryngoscope Size: Glidescope and 4 Grade View: Grade II Tube type: Oral Tube size: 7.5 mm Number of attempts: 2 Airway Equipment and Method: Stylet Placement Confirmation: ETT inserted through vocal cords under direct vision, positive ETCO2 and breath sounds checked- equal and bilateral Secured at: 22 cm Tube secured with: Tape Dental Injury: Teeth and Oropharynx as per pre-operative assessment  Comments: First attempt with Mac 4, unable to view cords, 2nd DL/attempt successful.

## 2023-09-20 NOTE — Progress Notes (Signed)
Pt went down for ERCP

## 2023-09-20 NOTE — Interval H&P Note (Signed)
History and Physical Interval Note:  09/20/2023 12:46 PM  Herbert Wood  has presented today for surgery, with the diagnosis of choledocholithiasis.  The various methods of treatment have been discussed with the patient and family. After consideration of risks, benefits and other options for treatment, the patient has consented to  Procedure(s): ENDOSCOPIC RETROGRADE CHOLANGIOPANCREATOGRAPHY (ERCP) WITH PROPOFOL (N/A) as a surgical intervention.  The patient's history has been reviewed, patient examined, no change in status, stable for surgery.  I have reviewed the patient's chart and labs.  I have explained risks and benefits including small but definite risks of pancreatitis (<10%), bleeding (<1%), perforation (<1%). The risks of general anesthesia were also discussed by me and anesthesia staff.  The benefits were also discussed.  Patient wishes to proceed.    Questions were answered to the patient's satisfaction.     Lynann Bologna

## 2023-09-20 NOTE — Plan of Care (Signed)
  Problem: Clinical Measurements: Goal: Ability to maintain clinical measurements within normal limits will improve Outcome: Progressing Goal: Respiratory complications will improve Outcome: Progressing   Problem: Activity: Goal: Risk for activity intolerance will decrease Outcome: Progressing   Problem: Pain Managment: Goal: General experience of comfort will improve and/or be controlled Outcome: Progressing

## 2023-09-20 NOTE — Progress Notes (Signed)
Pharmacy Consult for heparin Indication:  factor V leiden mutation/ h/o DVT  Allergies  Allergen Reactions   Gadobenate Nausea And Vomiting    Immediately upon the infusion of 15mL multihance contrast  Patient had exteme nausea and vomiting.   No other symptoms noted .  No injury.  MRI scan was completed after patient felt better.   Immediately upon the infusion of 15mL multihance contrast  Patient had exteme nausea and vomiting.   No other symptoms noted .  No injury.  MRI scan was completed after patient felt better.       Patient Measurements: Height: 5\' 10"  (177.8 cm) Weight: 90.7 kg (200 lb) IBW/kg (Calculated) : 73 HEPARIN DW (KG): 90.7  Vital Signs: Temp: 97.5 F (36.4 C) (02/20 1415) Temp Source: Temporal (02/20 1415) BP: 119/64 (02/20 1430) Pulse Rate: 59 (02/20 1435)  Labs: Recent Labs    09/18/23 0400 09/19/23 0624 09/19/23 1819 09/20/23 0627  HGB 9.8* 9.6*  --  10.8*  HCT 28.8* 28.1*  --  31.5*  PLT 109* 101*  --  128*  APTT  --   --  77*  --   CREATININE 1.54* 1.86*  --  1.34*    Estimated Creatinine Clearance: 44.8 mL/min (A) (by C-G formula based on SCr of 1.34 mg/dL (H)).  Assessment: Herbert Wood a 89 y.o. male requiring ERCP per GI- this is planned for 2/20. Pharmacy has been consulted for heparin d/t patient's risk of developing DVT  dosing.   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 This will require aPTT/HL correlation before transitioning to only heparin level monitoring. - LD 2/18 AM   Now s/p ERCP - resuming heparin at 8 pm  Goal of Therapy:  aPTT 66-102 seconds Monitor platelets by anticoagulation protocol: Yes   Plan:  Heparin at 1300 units / hr PTT and heparin level 8 hours after heparin begins F/U restart DOAC   Thank you Okey Regal, PharmD

## 2023-09-20 NOTE — Progress Notes (Signed)
[facility]                                                                              Herbert Wood, is a 89 y.o. 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  In ED, temp 102.1 F, BP soft, 90/42, noted some hypoxia, 88 to 89% on room air, HR 65, RR 17-21 Mild AKI with creatinine 1.3, was 1.19 on 09/07/2023, elevated transaminitis, procalcitonin 8.35 Started on broad-spectrum antibiotics.  Admitted for further workup  2/18: BxCx positive for klebsiella. Started on ceftriaxone MRCP positive for choledocholithiasis.  Seen by GI at [facility], patient was transferred to [facility] for ERCP an further workup.    Assessment & Plan    Principal Problem:   Sepsis (HCC) with Klebsiella bacteremia, POA -Patient met sepsis criteria at the time of admission with temp of 102.1 F, hypotensive BP 90/42, mild hypoxia (resolved), mild AKI, transaminitis, source likely due to cholangitis, choledocholithiasis. -Blood cultures positive for Klebsiella pneumonia -Continue IV Rocephin, 2 g IV daily, repeat blood cultures negative   Active Problems: Acute ascending cholangitis with choledocholithiasis -MRCP showed choledocholithiasis with CBD of 1.1 cm -ERCP planned today -General Surgery consulted for consideration of cholecystectomy  Acute on chronic kidney disease (CKD), stage IIIa (moderate) (HCC) -Likely due to #1, baseline creatinine 1.1-1.3 -Initially presented with creatinine of 1.38, 1.86 on 2/19 -Continue IV fluids, n.p.o.     Factor 5 Leiden mutation, heterozygous  (HCC), history of DVT -Eliquis currently on hold, awaiting ERCP, last dose on 2/18 AM -Continue IV heparin drip for now -Resume Eliquis after invasive procedures are completed   History of metastatic colon cancer with metastatic lesion in the liver -Diagnosed with sigmoid colon cancer in 2023, s/p left hemicolectomy, solitary metastatic lesion in the liver pending ablation by Dr. [name] -Per wife, the ablation procedure has been rescheduled to 10/10/2023     Essential hypertension -BP currently stable, continue amlodipine, Coreg, hydralazine     Dyslipidemia -Hold statin due to transaminitis    GERD without esophagitis -Continue PPI    Type 2 diabetes mellitus, NIDDM with chronic kidney disease, without long-term current use of insulin (HCC)  -Hemoglobin A1c 6.1 on 09/07/2023 CBG (last 3)  Recent Labs    09/19/23 2124 09/20/23 0657 09/20/23 0805  GLUCAP 141* 107* 111*   Continue sliding scale insulin   Estimated body mass index is 28.7 kg/m as calculated from the following:   Height as of this encounter: 5\' 10"  (1.778 m).   Weight as of this encounter: 90.7 kg.  Code Status: Full code DVT Prophylaxis:     Level of Care: Level of care: Med-Surg Family Communication: Updated  patient's wife at the bedside. Disposition Plan:      Remains inpatient appropriate: Awaiting ERCP today   Procedures:  MRCP  Consultants:   Gastroenterology  Antimicrobials:   Anti-infectives (From admission, onward)    Start     Dose/Rate Route Frequency Ordered Stop   09/20/23 0900  [MAR Hold]  ceFAZolin (ANCEF) IVPB 2g/100 mL premix        (MAR Hold since Thu 09/20/2023 at 1206.Hold Reason: Transfer to a Procedural area)   2 g 200 mL/hr over 30 Minutes Intravenous Every 8 hours 09/20/23 0803     09/18/23 1200  cefTRIAXone (ROCEPHIN) 2 g in sodium chloride 0.9 % 100 mL IVPB  Status:  Discontinued        2 g 200 mL/hr over 30 Minutes Intravenous Every 24 hours 09/18/23 1106 09/20/23  0803   09/17/23 2200  vancomycin (VANCOREADY) IVPB 1250 mg/250 mL  Status:  Discontinued        1,250 mg 166.7 mL/hr over 90 Minutes Intravenous Every 24 hours 09/16/23 2247 09/18/23 0746   09/17/23 0700  ceFEPIme (MAXIPIME) 2 g in sodium chloride 0.9 % 100 mL IVPB  Status:  Discontinued        2 g 200 mL/hr over 30 Minutes Intravenous  Once 09/16/23 2154 09/16/23 2247   09/16/23 2315  vancomycin (VANCOCIN) IVPB 1000 mg/200 mL premix        1,000 mg 200 mL/hr over 60 Minutes Intravenous  Once 09/16/23 2154 09/17/23 0405   09/16/23 2300  ceFEPIme (MAXIPIME) 2 g in sodium chloride 0.9 % 100 mL IVPB  Status:  Discontinued        2 g 200 mL/hr over 30 Minutes Intravenous Every 12 hours 09/16/23 2247 09/18/23 1106   09/16/23 2200  metroNIDAZOLE (FLAGYL) IVPB 500 mg  Status:  Discontinued        500 mg 100 mL/hr over 60 Minutes Intravenous Every 12 hours 09/16/23 2154 09/18/23 1106   09/16/23 2115  cefTRIAXone (ROCEPHIN) 1 g in sodium chloride 0.9 % 100 mL IVPB        1 g 200 mL/hr over 30 Minutes Intravenous  Once 09/16/23 2114 09/16/23 2301   09/16/23 2115  metroNIDAZOLE (FLAGYL) IVPB 500 mg  Status:  Discontinued        500 mg 100 mL/hr over 60 Minutes Intravenous Every 12 hours 09/16/23 2114 09/16/23 2159          Medications  [MAR Hold] amitriptyline  10 mg Oral QPM   [MAR Hold] amLODipine  5 mg Oral BID   [MAR Hold] ascorbic acid  1,000 mg Oral QPM   [MAR Hold] carvedilol  6.25 mg Oral BID WC   [MAR Hold] cholecalciferol  5,000 Units Oral QPM   [MAR Hold] cyanocobalamin  1,000 mcg Oral QPM   [MAR Hold] dorzolamide  1 drop Left Eye BID   [MAR Hold] ferrous sulfate  325 mg Oral Q breakfast   [MAR Hold] Finerenone  10 mg Oral Daily   [MAR Hold] folic acid  1 mg Oral QPM   [MAR Hold] hydrALAZINE  50 mg Oral BID   indomethacin  100 mg Rectal Once   [MAR Hold] insulin aspart  0-15 Units Subcutaneous TID WC   [MAR Hold] latanoprost  1 drop Both Eyes QHS   [MAR Hold] linagliptin   5 mg Oral q AM   [MAR Hold] loratadine  10 mg Oral q morning   [MAR Hold] pantoprazole  40 mg Oral BID AC  Subjective:   Herbert Wood was seen and examined today.  No acute complaints, seen this morning, awaiting ERCP.  By the bedside.  No nausea vomiting, acute abdominal pain, fevers or chills. Objective:   Vitals:   09/19/23 2044 09/20/23 0629 09/20/23 0804 09/20/23 1218  BP: 129/63 (!) 150/61 137/73 134/63  Pulse: (!) 53 (!) 58 (!) 59 68  Resp: 17 17 18 14   Temp: 97.8 F (36.6 C) 97.6 F (36.4 C) 97.9 F (36.6 C) 98.1 F (36.7 C)  TempSrc:   Oral Tympanic  SpO2: 98% 96% 97% 96%  Weight:      Height:        Intake/Output Summary (Last 24 hours) at 09/20/2023 1236 Last data filed at 09/20/2023 0543 Gross per 24 hour  Intake 2160.3 ml  Output 975 ml  Net 1185.3 ml     Wt Readings from Last 3 Encounters:  09/16/23 90.7 kg  09/07/23 89.4 kg  08/02/23 91.5 kg    Physical Exam General: Alert and oriented x 3, NAD Cardiovascular: S1 S2 clear, RRR.  Respiratory: CTAB, no wheezing Gastrointestinal: Soft, nontender, nondistended, NBS Ext: no pedal edema bilaterally Neuro: no new deficits Psych: Normal affect     Data Reviewed:  I have personally reviewed following labs    CBC Lab Results  Component Value Date   WBC 5.7 09/20/2023   RBC 3.28 (L) 09/20/2023   HGB 10.8 (L) 09/20/2023   HCT 31.5 (L) 09/20/2023   MCV 96.0 09/20/2023   MCH 32.9 09/20/2023   PLT 128 (L) 09/20/2023   MCHC 34.3 09/20/2023   RDW 13.5 09/20/2023   LYMPHSABS 0.9 09/20/2023   MONOABS 0.8 09/20/2023   EOSABS 0.2 09/20/2023   BASOSABS 0.0 09/20/2023     Last metabolic panel Lab Results  Component Value Date   NA 138 09/20/2023   K 3.9 09/20/2023   CL 109 09/20/2023   CO2 21 (L) 09/20/2023   BUN 15 09/20/2023   CREATININE 1.34 (H) 09/20/2023   GLUCOSE 105 (H) 09/20/2023   GFRNONAA 52 (L) 09/20/2023   CALCIUM 9.0 09/20/2023   PHOS 3.2 02/07/2023   PROT 5.4 (L)  09/20/2023   ALBUMIN 2.5 (L) 09/20/2023   LABGLOB 2.7 01/25/2022   AGRATIO 1.3 01/25/2022   BILITOT 2.2 (H) 09/20/2023   ALKPHOS 262 (H) 09/20/2023   AST 92 (H) 09/20/2023   ALT 126 (H) 09/20/2023   ANIONGAP 8 09/20/2023    CBG (last 3)  Recent Labs    09/19/23 2124 09/20/23 0657 09/20/23 0805  GLUCAP 141* 107* 111*      Coagulation Profile: Recent Labs  Lab 09/17/23 0428  INR 1.6*     Radiology Studies: I have personally reviewed the imaging studies  No results found.      Thad Ranger M.D. [facility] 09/20/2023, 12:36 PM  Available via Epic secure chat 7am-7pm After 7 pm, please refer to night coverage provider listed on amion.

## 2023-09-20 NOTE — Transfer of Care (Signed)
Immediate Anesthesia Transfer of Care Note  Patient: Herbert Wood  Procedure(s) Performed: ENDOSCOPIC RETROGRADE CHOLANGIOPANCREATOGRAPHY (ERCP) WITH PROPOFOL SPHINCTEROTOMY SPHINCTEROPLASTY REMOVAL OF STONES  Patient Location: PACU  Anesthesia Type:General  Level of Consciousness: awake and alert   Airway & Oxygen Therapy: Patient Spontanous Breathing and Patient connected to face mask oxygen  Post-op Assessment: Report given to RN and Post -op Vital signs reviewed and stable  Post vital signs: Reviewed and stable  Last Vitals:  Vitals Value Taken Time  BP 138/67 09/20/23 1415  Temp 98   Pulse 63 09/20/23 1417  Resp 16 09/20/23 1417  SpO2 100 % 09/20/23 1417  Vitals shown include unfiled device data.  Last Pain:  Vitals:   09/20/23 1218  TempSrc: Tympanic  PainSc: 0-No pain         Complications: No notable events documented.

## 2023-09-20 NOTE — Op Note (Addendum)
[facility] Patient Name: Herbert Wood Procedure Date : 09/20/2023 MRN: [id number] Attending MD: Lynann Bologna , MD, [id number] Date of Birth: Feb 06, 1935 CSN: [id number] Age: 89 Admit Type: Inpatient Procedure:                ERCP Indications:              Bile duct stone(s) with ? Ascending cholangitis Providers:                Lynann Bologna, MD, Stephens Shire RN, RN, Jacquelyn                            "Jaci" Clelia Croft, RN, Salley Scarlet, Technician Referring MD:              Medicines:                General Anesthesia, Ancef SKIP mg IV, Indomethacin                            100 mg PR, Glucagon 0.5 mg IV. Complications:            No immediate complications. Estimated Blood Loss:     Estimated blood loss: none. Procedure:                Pre-Anesthesia Assessment:                           - Prior to the procedure, a History and Physical                            was performed, and patient medications and                            allergies were reviewed. The patient's tolerance of                            previous anesthesia was also reviewed. The risks                            and benefits of the procedure and the sedation                            options and risks were discussed with the patient.                            All questions were answered, and informed consent                            was obtained. Prior Anticoagulants: The patient has                            taken Eliquis (apixaban), last dose was 3 days                            prior to procedure. Heparin was stopped 6 hours  prior. ASA Grade Assessment: III - A patient with                            severe systemic disease. After reviewing the risks                            and benefits, the patient was deemed in                            satisfactory condition to undergo the procedure.                           After obtaining informed consent, the scope was                             passed under direct vision. Throughout the                            procedure, the patient's blood pressure, pulse, and                            oxygen saturations were monitored continuously. The                            [id number]-Q190V ([id number] Olympus duodenoscope was                            introduced through the mouth, and used to inject                            contrast into and used to inject contrast into the                            bile duct. The ERCP was accomplished without                            difficulty. The patient tolerated the procedure                            well. Scope In: Scope Out: Findings:      The scout film was normal. The esophagus was successfully intubated       under direct vision. The scope was advanced to a normal major papilla in       the descending duodenum without detailed examination of the pharynx,       larynx and associated structures, and upper GI tract. The upper GI tract       was grossly normal. A benign-appearing stricture was noted in the second       portion of the duodenum just beyond the major papilla.      The bile duct was deeply cannulated with the short-nosed traction       sphincterotome using a wire-guided approach on first attempt. Contrast       was injected. I personally interpreted the bile duct images. Ductal flow       of  contrast was adequate. Image quality was adequate. Contrast extended       to the entire biliary tree.      2 large filling defects consistent with choledocholithiasis was found in       the Px and mid CBD, largest measuring 10 mm. The main bile duct was       moderately dilated measuring approximately 12 mm. The right and left       hepatic ducts were mildly dilated. The cystic duct was patent. Multiple       filling defects were noted in the gallbladder consistent with       cholelithiasis.      A 6 mm biliary sphincterotomy was made with a monofilament traction        (standard) sphincterotome using ERBE electrocautery. There was no       post-sphincterotomy bleeding. Dilation of the common bile duct with a       05-11-11 mm x 5.5 cm CRE balloon (to a maximum balloon size of 11 mm)       dilator was successful. The biliary tree was swept with a 12 mm balloon       starting at the bifurcation several times. All stones were removed. See       endoscopic photodocumentation. Postocclusion cholangiogram did not       reveal any residual filling defects. Bile was green without any pus.      PD was not injected or cannulated intentionally. Impression:               - Choledocholithiasis was found. Complete removal                            was accomplished by biliary                            sphincterotomy/sphincteroplasty and balloon                            extraction.                           - Patent cystic duct.                           - Cholelithiasis. Recommendation:           - Return patient to hospital ward for ongoing care.                           - Clear liquid diet. Advance as tolerated.                           - Continue antibiotics for total of 5 days.                           - Resume Eliquis in 72 hours. If heparin is needed,                            can restart in 48 hours.                           -  Watch for pancreatitis, bleeding, perforation,                            and cholangitis.                           - The findings and recommendations were discussed                            with the patient's wife Herbert Wood.                           - Reviewed surgery's note. Hold off on lap chole                            d/t multiple comorbidities and vicinity of                            metastatic lesion to gallbladder fossa. I agree. Procedure Code(s):        --- Professional ---                           [phone], 59, Endoscopic retrograde                            cholangiopancreatography (ERCP); with                             trans-endoscopic balloon dilation of                            biliary/pancreatic duct(s) or of ampulla                            (sphincteroplasty), including sphincterotomy, when                            performed, each duct                           [id number], Endoscopic retrograde                            cholangiopancreatography (ERCP); with removal of                            calculi/debris from biliary/pancreatic duct(s)                           [id number], Endoscopic catheterization of the biliary                            ductal system, radiological supervision and                            interpretation Diagnosis Code(s):        --- Professional ---  K80.51, Calculus of bile duct without cholangitis                            or cholecystitis with obstruction CPT copyright 2022 [employer]. All rights reserved. The codes documented in this report are preliminary and upon coder review may  be revised to meet current compliance requirements. Lynann Bologna, MD 09/20/2023 2:42:32 PM This report has been signed electronically. Number of Addenda: 0

## 2023-09-21 DIAGNOSIS — K8309 Other cholangitis: Secondary | ICD-10-CM | POA: Diagnosis not present

## 2023-09-21 DIAGNOSIS — R7881 Bacteremia: Secondary | ICD-10-CM | POA: Diagnosis not present

## 2023-09-21 DIAGNOSIS — B961 Klebsiella pneumoniae [K. pneumoniae] as the cause of diseases classified elsewhere: Secondary | ICD-10-CM | POA: Diagnosis not present

## 2023-09-21 LAB — GLUCOSE, CAPILLARY
Glucose-Capillary: 124 mg/dL — ABNORMAL HIGH (ref 70–99)
Glucose-Capillary: 125 mg/dL — ABNORMAL HIGH (ref 70–99)
Glucose-Capillary: 140 mg/dL — ABNORMAL HIGH (ref 70–99)
Glucose-Capillary: 146 mg/dL — ABNORMAL HIGH (ref 70–99)

## 2023-09-21 LAB — COMPREHENSIVE METABOLIC PANEL
ALT: 71 U/L — ABNORMAL HIGH (ref 0–44)
AST: 61 U/L — ABNORMAL HIGH (ref 15–41)
Albumin: 2.6 g/dL — ABNORMAL LOW (ref 3.5–5.0)
Alkaline Phosphatase: 214 U/L — ABNORMAL HIGH (ref 38–126)
Anion gap: 11 (ref 5–15)
BUN: 18 mg/dL (ref 8–23)
CO2: 22 mmol/L (ref 22–32)
Calcium: 8.7 mg/dL — ABNORMAL LOW (ref 8.9–10.3)
Chloride: 104 mmol/L (ref 98–111)
Creatinine, Ser: 1.28 mg/dL — ABNORMAL HIGH (ref 0.61–1.24)
GFR, Estimated: 55 mL/min — ABNORMAL LOW (ref >60)
Glucose, Bld: 128 mg/dL — ABNORMAL HIGH (ref 70–99)
Potassium: 4 mmol/L (ref 3.5–5.1)
Sodium: 137 mmol/L (ref 135–145)
Total Bilirubin: 1.4 mg/dL — ABNORMAL HIGH (ref 0.0–1.2)
Total Protein: 5.5 g/dL — ABNORMAL LOW (ref 6.5–8.1)

## 2023-09-21 LAB — CBC WITH DIFFERENTIAL/PLATELET
Abs Immature Granulocytes: 0.03 10*3/uL (ref 0.00–0.07)
Basophils Absolute: 0 10*3/uL (ref 0.0–0.1)
Basophils Relative: 0 %
Eosinophils Absolute: 0 10*3/uL (ref 0.0–0.5)
Eosinophils Relative: 0 %
HCT: 31.1 % — ABNORMAL LOW (ref 39.0–52.0)
Hemoglobin: 10.9 g/dL — ABNORMAL LOW (ref 13.0–17.0)
Immature Granulocytes: 1 %
Lymphocytes Relative: 13 %
Lymphs Abs: 0.9 10*3/uL (ref 0.7–4.0)
MCH: 33.2 pg (ref 26.0–34.0)
MCHC: 35 g/dL (ref 30.0–36.0)
MCV: 94.8 fL (ref 80.0–100.0)
Monocytes Absolute: 0.4 10*3/uL (ref 0.1–1.0)
Monocytes Relative: 6 %
Neutro Abs: 5.3 10*3/uL (ref 1.7–7.7)
Neutrophils Relative %: 80 %
Platelets: 159 10*3/uL (ref 150–400)
RBC: 3.28 MIL/uL — ABNORMAL LOW (ref 4.22–5.81)
RDW: 13.3 % (ref 11.5–15.5)
WBC: 6.7 10*3/uL (ref 4.0–10.5)
nRBC: 0 % (ref 0.0–0.2)

## 2023-09-21 LAB — CULTURE, BLOOD (ROUTINE X 2): Special Requests: ADEQUATE

## 2023-09-21 LAB — APTT: aPTT: 120 s — ABNORMAL HIGH (ref 24–36)

## 2023-09-21 LAB — HEPARIN LEVEL (UNFRACTIONATED): Heparin Unfractionated: 0.51 [IU]/mL (ref 0.30–0.70)

## 2023-09-21 MED ORDER — HYDROCODONE-ACETAMINOPHEN 5-325 MG PO TABS: 1.0000 | ORAL_TABLET | Freq: Three times a day (TID) | ORAL | 0 refills | Status: DC | PRN

## 2023-09-21 MED ORDER — ONDANSETRON HCL 4 MG PO TABS: 4.0000 mg | ORAL_TABLET | Freq: Four times a day (QID) | ORAL | 0 refills | Status: AC | PRN

## 2023-09-21 MED ORDER — FOLIC ACID 1 MG PO TABS: 1.0000 mg | ORAL_TABLET | Freq: Every evening | ORAL | 0 refills | Status: AC

## 2023-09-21 MED ORDER — AMOXICILLIN-POT CLAVULANATE 875-125 MG PO TABS: 1.0000 | ORAL_TABLET | Freq: Two times a day (BID) | ORAL | 0 refills | Status: DC

## 2023-09-21 NOTE — Plan of Care (Signed)
  Problem: Activity: Goal: Risk for activity intolerance will decrease Outcome: Progressing   Problem: Pain Managment: Goal: General experience of comfort will improve and/or be controlled Outcome: Progressing   Problem: Metabolic: Goal: Ability to maintain appropriate glucose levels will improve Outcome: Progressing   Problem: Nutritional: Goal: Maintenance of adequate nutrition will improve Outcome: Progressing

## 2023-09-21 NOTE — Progress Notes (Signed)
Written and verbal discharge instructions given to patient-verbalized understanding. Peripheral IV x2 removed without complications. Bed lowered and call bell within reach.

## 2023-09-21 NOTE — Discharge Summary (Signed)
Physician Discharge Summary   Patient: Herbert Wood MRN: [id number] DOB: 02-01-1935  Admit date:     09/16/2023  Discharge date: 09/21/23  Discharge Physician: Deanna Artis   PCP: [name], MD   Recommendations at discharge:   At this time patient will be discharged home.  If you experience any symptoms such as fever, vomiting, shortness of breath, chest pain, abdominal pain, or other concerning symptoms, please call your primary care provider or go to the emergency department immediately.  Discharge Diagnoses: Principal Problem:   Sepsis (HCC) Active Problems:   Chronic kidney disease (CKD), stage III (moderate) (HCC)   Factor 5 Leiden mutation, heterozygous (HCC)   Essential hypertension   Dyslipidemia   Acute kidney injury superimposed on chronic kidney disease (HCC)   History of colon cancer   GERD without esophagitis   Type 2 diabetes mellitus with chronic kidney disease, without long-term current use of insulin (HCC)   Choledocholithiasis   Cholangitis   Bacteremia due to Klebsiella pneumoniae  Resolved Problems:   * No resolved hospital problems. 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  In ED, temp 102.1 F, BP soft, 90/42, noted some hypoxia, 88 to 89% on room air, HR 65, RR 17-21 Mild AKI with creatinine 1.3, was 1.19 on 09/07/2023, elevated transaminitis, procalcitonin 8.35 Started on broad-spectrum antibiotics.  Admitted for further workup   2/18: BxCx positive for klebsiella. Started on ceftriaxone MRCP positive for choledocholithiasis.  Seen by GI at [facility], patient was transferred to [facility] for ERCP an further workup.   Assessment and Plan:  Sepsis with Klebsiella bacteremia, POA -Patient met sepsis criteria at the time of admission  with temp of 102.1 F, hypotensive BP 90/42, mild hypoxia (resolved), mild AKI, transaminitis, source likely due to cholangitis, choledocholithiasis.  Blood cultures positive for Klebsiella pneumonia.  Received IV Rocephin, 2 g IV daily, repeat blood cultures negative.  Will transition patient to p.o. Augmentin to take for the next 5 days upon discharge.   Acute ascending cholangitis with choledocholithiasis -MRCP showed choledocholithiasis with CBD of 1.1 cm.  ERCP performed removing 2 stones along with sphincterotomy.  General surgery consulted for possible cholecystectomy however given patient's known metastasis near the site, opting to defer given no obvious signs of cholecystitis.  If cholecystitis did occur, percutaneous cholecystostomy tube would be preferred option at this arise in the future.  Cholangitis and choledocholithiasis resolved.  Continue antibiotics as above.  As needed hydrocodone prescription given if needed.  acute on chronic kidney disease (CKD), stage IIIa (moderate) (HCC) -Creatinine 1.86 on 2/19.  Resolved after IV fluid hydration.   Factor 5 Leiden mutation, heterozygous, history of DVT -Recommendations to restart Eliquis 3 days after ERCP (2/23).  History of metastatic colon cancer with metastatic lesion in the liver -Diagnosed with sigmoid colon cancer in 2023, s/p left hemicolectomy, solitary metastatic lesion in the liver pending ablation by Dr. [name].  Per wife, the ablation procedure has been rescheduled to 10/10/2023.   Essential hypertension -Zoom home regimen upon discharge  Dyslipidemia -Resume home regiment     GERD without esophagitis -Continue PPI     Type 2 diabetes mellitus, NIDDM with chronic kidney disease, without long-term current use of insulin  -Hemoglobin A1c 6.1 on 09/07/2023.  Resume home regimen  upon discharge        Pain control - [place] Controlled Substance Reporting System database was reviewed. and patient was instructed,  not to drive, operate heavy machinery, perform activities at heights, swimming or participation in water activities or provide baby-sitting services while on Pain, Sleep and Anxiety Medications; until their outpatient Physician has advised to do so again. Also recommended to not to take more than prescribed Pain, Sleep and Anxiety Medications.  Consultants: Gastroenterology, general surgery Procedures performed: ERCP with sphincterotomy Disposition: Home Diet recommendation:  Discharge Diet Orders (From admission, onward)     Start     Ordered   09/21/23 0000  Diet - low sodium heart healthy        09/21/23 1130           Cardiac and Carb modified diet DISCHARGE MEDICATION: Allergies as of 09/21/2023       Reactions   Gadobenate Nausea And Vomiting   Immediately upon the infusion of 15mL multihance contrast  Patient had exteme nausea and vomiting.   No other symptoms noted .  No injury.  MRI scan was completed after patient felt better.   Immediately upon the infusion of 15mL multihance contrast  Patient had exteme nausea and vomiting.   No other symptoms noted .  No injury.  MRI scan was completed after patient felt better.          Medication List     STOP taking these medications    predniSONE 50 MG tablet Commonly known as: DELTASONE   sucralfate 1 GM/10ML suspension Commonly known as: CARAFATE   traMADol 50 MG tablet Commonly known as: ULTRAM       TAKE these medications    amitriptyline 10 MG tablet Commonly known as: ELAVIL Take 10 mg by mouth every evening.   amLODipine 5 MG tablet Commonly known as: NORVASC Take 5 mg by mouth 2 (two) times daily.   amoxicillin-clavulanate 875-125 MG tablet Commonly known as: AUGMENTIN Take 1 tablet by mouth 2 (two) times daily.   atorvastatin 40 MG tablet Commonly known as: LIPITOR Take 40 mg by mouth every evening.   carvedilol 6.25 MG tablet Commonly known as: COREG Take 6.25 mg by mouth 2 (two) times daily  with a meal.   cyanocobalamin 1000 MCG tablet Commonly known as: VITAMIN B12 Take 1,000 mcg by mouth every evening. Now (Brand)   dorzolamide 2 % ophthalmic solution Commonly known as: TRUSOPT Place 1 drop into the left eye 2 (two) times daily.   Eliquis 5 MG Tabs tablet Generic drug: apixaban TAKE 1 TABLET(5 MG) BY MOUTH TWICE DAILY   ferrous sulfate 325 (65 FE) MG tablet Take 325 mg by mouth daily with breakfast.   folic acid 1 MG tablet Commonly known as: FOLVITE Take 1 tablet (1 mg total) by mouth every evening. What changed:  medication strength how much to take additional instructions   hydrALAZINE 25 MG tablet Commonly known as: APRESOLINE Take 50 mg by mouth in the morning and at bedtime.   HYDROcodone-acetaminophen 5-325 MG tablet Commonly known as: Norco Take 1 tablet by mouth every 8 (eight) hours as needed for moderate pain (pain score 4-6).   Kerendia 10 MG Tabs Generic drug: Finerenone Take 10 mg by mouth daily.   latanoprost 0.005 % ophthalmic solution Commonly known as: XALATAN Place 1 drop into both eyes at bedtime.   linagliptin 5 MG Tabs tablet Commonly known as: TRADJENTA Take 5 mg by mouth in the morning.  loratadine 10 MG tablet Commonly known as: CLARITIN Take 10 mg by mouth every morning.   omeprazole 40 MG capsule Commonly known as: PRILOSEC Take 1 capsule (40 mg total) by mouth daily.   ondansetron 4 MG disintegrating tablet Commonly known as: ZOFRAN-ODT 4mg  ODT q4 hours prn nausea/vomit   ondansetron 4 MG tablet Commonly known as: ZOFRAN Take 1 tablet (4 mg total) by mouth every 6 (six) hours as needed for nausea.   PROSTATE SUPPORT PO Take 1 capsule by mouth every evening. NOW Prostate Support   tobramycin 0.3 % ophthalmic solution Commonly known as: TOBREX INSTILL 5 DROPS TO RIGHT EAR TWICE DAILY FOR 7 DAYS   Vitamin C/Natural Rose Hips 1000 MG tablet Generic drug: ascorbic acid Take 1,000 mg by mouth every evening.    Vitamin D3 125 MCG (5000 UT) Caps Take 5,000 Units by mouth every evening.        Discharge Exam: Filed Weights   09/16/23 1202  Weight: 90.7 kg   GENERAL:  Alert, pleasant, no acute distress  HEENT:  EOMI CARDIOVASCULAR:  RRR, no murmurs appreciated RESPIRATORY:  Clear to auscultation, no wheezing, rales, or rhonchi GASTROINTESTINAL:  Soft, nontender, nondistended EXTREMITIES:  No LE edema bilaterally NEURO:  No new focal deficits appreciated SKIN:  No rashes noted PSYCH:  Appropriate mood and affect    Condition at discharge: improving  The results of significant diagnostics from this hospitalization (including imaging, microbiology, ancillary and laboratory) are listed below for reference.   Imaging Studies: DG ERCP Result Date: 09/20/2023 CLINICAL DATA:  Choledocholithiasis. EXAM: ERCP TECHNIQUE: Multiple spot images obtained with the fluoroscopic device and submitted for interpretation post-procedure. COMPARISON:  MRI/MRCP on 09/18/2023 FINDINGS: Imaging obtained with a C-arm demonstrates cannulation of the common bile duct with cholangiogram demonstrating discrete filling defects within the proximal and mid aspects of a dilated common bile duct. Balloon sweep maneuver was performed to remove calculi. Completion cholangiogram shows no further filling defects. IMPRESSION: Choledocholithiasis confirmed in the common bile duct with balloon sweep maneuver to remove calculi. These images were submitted for radiologic interpretation only. Please see the procedural report for the amount of contrast and the fluoroscopy time utilized. Electronically Signed   By: Irish Lack M.D.   On: 09/20/2023 16:20   MR ABDOMEN MRCP WO CONTRAST Result Date: 09/18/2023 CLINICAL DATA:  Biliary obstruction suspected metastatic colon cancer EXAM: MRI ABDOMEN WITHOUT CONTRAST  (INCLUDING MRCP) TECHNIQUE: Multiplanar multisequence MR imaging of the abdomen was performed. Heavily T2-weighted images of  the biliary and pancreatic ducts were obtained, and three-dimensional MRCP images were rendered by post processing. COMPARISON:  Right upper quadrant ultrasound, 09/17/2023 CT chest angiogram, 09/16/2023, PET-CT, 07/28/2023 FINDINGS: Lower chest: Trace pleural effusions. Hepatobiliary: Metastasis of the inferior right lobe of the liver, hepatic segment VI (series 3, image 23). Sludge in the gallbladder. Mild gallbladder wall thickening. Adjacent gallstones in the central common bile duct measuring 1.0 and 0.7 cm (series 4, image 15). Common bile duct measures up to 1.1 cm in caliber within the pancreatic head with mild intrahepatic biliary ductal dilatation. Pancreas: Unremarkable. No pancreatic ductal dilatation or surrounding inflammatory changes. Spleen: Normal in size without significant abnormality. Adrenals/Urinary Tract: Adrenal glands are unremarkable. Numerous bilateral renal cortical and parapelvic cysts, some of which are septated, requiring no specific further follow-up or characterization. No calculi or hydronephrosis. Stomach/Bowel: Stomach is within normal limits. No evidence of bowel wall thickening, distention, or inflammatory changes. Vascular/Lymphatic: No significant vascular findings are present. No enlarged abdominal lymph nodes.  Other: No abdominal wall hernia.  Mild anasarca.  Trace ascites. Musculoskeletal: No acute or significant osseous findings. IMPRESSION: 1. Choledocholithiasis with adjacent gallstones in the central common bile duct measuring 1.0 and 0.7 cm. Common bile duct measures up to 1.1 cm in caliber within the pancreatic head with mild intrahepatic biliary ductal dilatation. 2. Sludge in the gallbladder. Mild gallbladder wall thickening. 3. Known metastasis of the inferior right lobe of the liver, hepatic segment VI. 4. Trace pleural effusions, trace ascites, and mild anasarca. These results will be called to the ordering clinician or representative by the Radiologist  Assistant, and communication documented in the PACS or [employer]. Electronically Signed   By: Jearld Lesch M.D.   On: 09/18/2023 11:03   MR 3D Recon At Scanner Result Date: 09/18/2023 CLINICAL DATA:  Biliary obstruction suspected metastatic colon cancer EXAM: MRI ABDOMEN WITHOUT CONTRAST  (INCLUDING MRCP) TECHNIQUE: Multiplanar multisequence MR imaging of the abdomen was performed. Heavily T2-weighted images of the biliary and pancreatic ducts were obtained, and three-dimensional MRCP images were rendered by post processing. COMPARISON:  Right upper quadrant ultrasound, 09/17/2023 CT chest angiogram, 09/16/2023, PET-CT, 07/28/2023 FINDINGS: Lower chest: Trace pleural effusions. Hepatobiliary: Metastasis of the inferior right lobe of the liver, hepatic segment VI (series 3, image 23). Sludge in the gallbladder. Mild gallbladder wall thickening. Adjacent gallstones in the central common bile duct measuring 1.0 and 0.7 cm (series 4, image 15). Common bile duct measures up to 1.1 cm in caliber within the pancreatic head with mild intrahepatic biliary ductal dilatation. Pancreas: Unremarkable. No pancreatic ductal dilatation or surrounding inflammatory changes. Spleen: Normal in size without significant abnormality. Adrenals/Urinary Tract: Adrenal glands are unremarkable. Numerous bilateral renal cortical and parapelvic cysts, some of which are septated, requiring no specific further follow-up or characterization. No calculi or hydronephrosis. Stomach/Bowel: Stomach is within normal limits. No evidence of bowel wall thickening, distention, or inflammatory changes. Vascular/Lymphatic: No significant vascular findings are present. No enlarged abdominal lymph nodes. Other: No abdominal wall hernia.  Mild anasarca.  Trace ascites. Musculoskeletal: No acute or significant osseous findings. IMPRESSION: 1. Choledocholithiasis with adjacent gallstones in the central common bile duct measuring 1.0 and 0.7 cm. Common  bile duct measures up to 1.1 cm in caliber within the pancreatic head with mild intrahepatic biliary ductal dilatation. 2. Sludge in the gallbladder. Mild gallbladder wall thickening. 3. Known metastasis of the inferior right lobe of the liver, hepatic segment VI. 4. Trace pleural effusions, trace ascites, and mild anasarca. These results will be called to the ordering clinician or representative by the Radiologist Assistant, and communication documented in the PACS or [employer]. Electronically Signed   By: Jearld Lesch M.D.   On: 09/18/2023 11:03   US Abdomen Limited RUQ (LIVER/GB) Result Date: 09/17/2023 CLINICAL DATA:  Elevated LFTs EXAM: ULTRASOUND ABDOMEN LIMITED RIGHT UPPER QUADRANT COMPARISON:  CT C AP 09/16/2023 FINDINGS: Gallbladder: Sludge in the gallbladder lumen. No gallbladder wall thickening or pericholecystic fluid. Negative sonographic Murphy's sign. Common bile duct: Diameter: 11 mm, dilated. Liver: Known liver lesion not well assessed on current exam. Increased parenchymal echogenicity. Portal vein is patent on color Doppler imaging with normal direction of blood flow towards the liver. Other: None. IMPRESSION: 1. Sludge in the gallbladder lumen. No secondary signs to suggest acute cholecystitis. 2. Dilated common bile duct. Recommend correlation with LFTs. If there is concern for biliary obstruction, recommend MRI/MRCP. 3. Known liver lesion not well assessed on current exam. 4. Increased hepatic parenchymal echogenicity suggestive of steatosis. Electronically  Signed   By: Annia Belt M.D.   On: 09/17/2023 12:56   CT Angio Chest/Abd/Pel for Dissection W and/or Wo Contrast Result Date: 09/16/2023 CLINICAL DATA:  Acute aortic syndrome (AAS) suspected. Mild chest tightening. Shortness of breath. History of metastatic colon cancer. Liver ablation. * Tracking Code: BO * EXAM: CT ANGIOGRAPHY CHEST, ABDOMEN AND PELVIS TECHNIQUE: Non-contrast CT of the chest was initially obtained.  Multidetector CT imaging through the chest, abdomen and pelvis was performed using the standard protocol during bolus administration of intravenous contrast. Multiplanar reconstructed images and MIPs were obtained and reviewed to evaluate the vascular anatomy. RADIATION DOSE REDUCTION: This exam was performed according to the departmental dose-optimization program which includes automated exposure control, adjustment of the mA and/or kV according to patient size and/or use of iterative reconstruction technique. CONTRAST:  OMNIPAQUE IOHEXOL 350 MG/ML SOLN COMPARISON:  CT scan chest from 07/02/2023 and PET-CT scan from 07/19/2023. FINDINGS: CTA CHEST FINDINGS Cardiovascular: No intramural hematoma noted in the thoracic aorta on the unenhanced images. Thoracic aorta is normal in caliber without aneurysm, dissection, vasculitis or significant stenosis. There is satisfactory opacification of bilateral pulmonary artery branches up to the segmental level. No filling defects noted to suggest embolism to the segmental artery level. Normal cardiac size. No pericardial effusion. No aortic aneurysm. There are coronary artery calcifications, in keeping with coronary artery disease. There are also moderate to severe peripheral atherosclerotic vascular calcifications of thoracic aorta and its major branches. Mediastinum/Nodes: Visualized thyroid gland appears grossly unremarkable. No solid / cystic mediastinal masses. The esophagus is nondistended precluding optimal assessment. No axillary, mediastinal or hilar lymphadenopathy by size criteria. Lungs/Pleura: The central tracheo-bronchial tree is patent. There are 3, solid noncalcified nodules in the bilateral lungs (series 100, images 84, 120 and 135), which have increased in size in the prior study and compatible with metastases. The largest nodule is in the right lung lower lobe and measures 8.5 x 9.3 mm. There is an additional sub-4 mm noncalcified nodule in the right  lung lower lobe (series 100, image 119), which is unchanged since several prior studies and favored benign in etiology. No mass or consolidation. No pleural effusion or pneumothorax. Musculoskeletal: The visualized soft tissues of the chest wall are grossly unremarkable. No suspicious osseous lesions. There are mild multilevel degenerative changes in the visualized spine. Review of the MIP images confirms the above findings. CTA ABDOMEN AND PELVIS FINDINGS VASCULAR Aorta: Normal caliber aorta without aneurysm, dissection, vasculitis or significant stenosis. Celiac: Patent without evidence of aneurysm, dissection, vasculitis or significant stenosis. SMA: Patent without evidence of aneurysm, dissection, vasculitis or significant stenosis. Renals: Both renal arteries are patent without evidence of aneurysm, dissection, vasculitis, fibromuscular dysplasia or significant stenosis. IMA: Patent without evidence of aneurysm, dissection, vasculitis or significant stenosis. Inflow: Patent without evidence of aneurysm, dissection, vasculitis or significant stenosis. Veins: No obvious venous abnormality within the limitations of this arterial phase study. Review of the MIP images confirms the above findings. NON-VASCULAR Hepatobiliary: The liver is normal in size. Non-cirrhotic configuration. Redemonstration of heterogeneous hypoattenuating mass in the right hepatic lobe, segment 6 measuring 4.3 x 5.8 cm, compatible with metastasis. There is interval increase in the size of the lesion when compared to the prior PET-CT scan from 07/19/2023. No new suspicious liver lesions seen. No intrahepatic or extrahepatic bile duct dilation. Gallbladder is distended. There is mild diffuse wall thickening. No calcified gallstones noted. However, there is subtle pericholecystic fat stranding. Findings are equivocal for acute cholecystitis on the basis  of CT scan. Correlate clinically and with liver function tests to determine the need for  additional imaging. Pancreas: Unremarkable. No pancreatic ductal dilatation or surrounding inflammatory changes. Spleen: Within normal limits. No focal lesion. Adrenals/Urinary Tract: Adrenal glands are unremarkable. No suspicious renal mass. Multiple bilateral simple renal cysts noted with largest arising from the right kidney measuring 4.2 x 5.8 cm. No nephroureterolithiasis or obstructive uropathy. Unremarkable urinary bladder. Stomach/Bowel: There is a small sliding hiatal hernia. Patient is status post right hemicolectomy with ileocolonic anastomosis in the right upper quadrant. No disproportionate dilation of the small or large bowel loops. No evidence of abnormal bowel wall thickening or inflammatory changes. There are multiple diverticula throughout the colon, without imaging signs of diverticulitis. Vascular/Lymphatic: No ascites or pneumoperitoneum. No abdominal or pelvic lymphadenopathy, by size criteria. No aneurysmal dilation of the major abdominal arteries. There are moderate peripheral atherosclerotic vascular calcifications of the aorta and its major branches. Reproductive: Enlarged prostate. Symmetric seminal vesicles. Other: There is a small right paramedian supraumbilical ventral hernia containing fat and portion of unobstructed transverse colon. There is also a small fat containing left inguinal hernia. The soft tissues and abdominal wall are otherwise unremarkable. Musculoskeletal: No suspicious osseous lesions. There are mild - moderate multilevel degenerative changes in the visualized spine. Mild compression deformity of L3 vertebral body, similar to the prior study. Review of the MIP images confirms the above findings. IMPRESSION: 1. No acute aortic syndrome. No thoracic aortic acute intramural hematoma. No thoracoabdominal aortic aneurysm, dissection or penetrating atherosclerotic ulcer. There are moderate to severe peripheral atherosclerotic vascular calcifications of the major arteries of  the chest, abdomen and pelvis. 2. There are 3 enlarging, subcentimeter, solid pulmonary nodules, suggesting worsening metastases. 3. Mild interval increase in the patient's known ablated right hepatic lobe liver lesion as well. No new liver lesion seen. 4. No other metastatic disease identified within the chest, abdomen or pelvis. 5. Multiple other nonacute observations, as described above. Aortic Atherosclerosis (ICD10-I70.0). Electronically Signed   By: Jules Schick M.D.   On: 09/16/2023 14:53   DG Chest Portable 1 View Result Date: 09/16/2023 CLINICAL DATA:  89 year old male with history of chest tightness. EXAM: PORTABLE CHEST 1 VIEW COMPARISON:  Chest x-ray 02/07/2023. FINDINGS: Chronic elevation of the right hemidiaphragm again noted. No acute consolidative airspace disease. No pleural effusions. No pneumothorax. No evidence of pulmonary edema. Heart size is upper limits of normal. Upper mediastinal contours are within normal limits. Atherosclerotic calcifications are noted in the thoracic aorta. IMPRESSION: 1. No radiographic evidence of acute cardiopulmonary disease. 2. Aortic atherosclerosis. Electronically Signed   By: Trudie Reed M.D.   On: 09/16/2023 13:00    Microbiology: Results for orders placed or performed during the hospital encounter of 09/16/23  Resp panel by RT-PCR (RSV, Flu A&B, Covid) Anterior Nasal Swab     Status: None   Collection Time: 09/16/23  7:16 PM   Specimen: Anterior Nasal Swab  Result Value Ref Range Status   SARS Coronavirus 2 by RT PCR NEGATIVE NEGATIVE Final    Comment: (NOTE) SARS-CoV-2 target nucleic acids are NOT DETECTED.  The SARS-CoV-2 RNA is generally detectable in upper respiratory specimens during the acute phase of infection. The lowest concentration of SARS-CoV-2 viral copies this assay can detect is 138 copies/mL. A negative result does not preclude SARS-Cov-2 infection and should not be used as the sole basis for treatment or other  patient management decisions. A negative result may occur with  improper specimen collection/handling, submission of specimen  other than nasopharyngeal swab, presence of viral mutation(s) within the areas targeted by this assay, and inadequate number of viral copies(<138 copies/mL). A negative result must be combined with clinical observations, patient history, and epidemiological information. The expected result is Negative.  Fact Sheet for Patients:  BloggerCourse.com  Fact Sheet for Healthcare Providers:  SeriousBroker.it  This test is no t yet approved or cleared by the [place] FDA and  has been authorized for detection and/or diagnosis of SARS-CoV-2 by FDA under an Emergency Use Authorization (EUA). This EUA will remain  in effect (meaning this test can be used) for the duration of the COVID-19 declaration under Section 564(b)(1) of the Act, 21 U.S.C.section 360bbb-3(b)(1), unless the authorization is terminated  or revoked sooner.       Influenza A by PCR NEGATIVE NEGATIVE Final   Influenza B by PCR NEGATIVE NEGATIVE Final    Comment: (NOTE) The Xpert Xpress SARS-CoV-2/FLU/RSV plus assay is intended as an aid in the diagnosis of influenza from Nasopharyngeal swab specimens and should not be used as a sole basis for treatment. Nasal washings and aspirates are unacceptable for Xpert Xpress SARS-CoV-2/FLU/RSV testing.  Fact Sheet for Patients: BloggerCourse.com  Fact Sheet for Healthcare Providers: SeriousBroker.it  This test is not yet approved or cleared by the [place] FDA and has been authorized for detection and/or diagnosis of SARS-CoV-2 by FDA under an Emergency Use Authorization (EUA). This EUA will remain in effect (meaning this test can be used) for the duration of the COVID-19 declaration under Section 564(b)(1) of the Act, 21 U.S.C. section  360bbb-3(b)(1), unless the authorization is terminated or revoked.     Resp Syncytial Virus by PCR NEGATIVE NEGATIVE Final    Comment: (NOTE) Fact Sheet for Patients: BloggerCourse.com  Fact Sheet for Healthcare Providers: SeriousBroker.it  This test is not yet approved or cleared by the [place] FDA and has been authorized for detection and/or diagnosis of SARS-CoV-2 by FDA under an Emergency Use Authorization (EUA). This EUA will remain in effect (meaning this test can be used) for the duration of the COVID-19 declaration under Section 564(b)(1) of the Act, 21 U.S.C. section 360bbb-3(b)(1), unless the authorization is terminated or revoked.  Performed at [facility], [address], [place], [place] [address]   Blood culture (routine x 2)     Status: Abnormal   Collection Time: 09/16/23  9:56 PM   Specimen: Right Antecubital; Blood  Result Value Ref Range Status   Specimen Description   Final    RIGHT ANTECUBITAL Performed at [facility], [address], [place], [place] [address]    Special Requests   Final    BOTTLES DRAWN AEROBIC AND ANAEROBIC Blood Culture adequate volume Performed at [facility], [address], [place], [place] [address]    Culture  Setup Time   Final    GRAM NEGATIVE RODS IN BOTH AEROBIC AND ANAEROBIC BOTTLES Gram Stain Report Called to,Read Back By and Verified With: D CETRONE AT 1117 09/17/2023 BY A WILSON CRITICAL RESULT CALLED TO, READ BACK BY AND VERIFIED WITH: RN DOMINIC CITRONE ON 09/17/23 @ 1755 BY DRT Performed at [facility], [address], [place], [place] [address]    Culture KLEBSIELLA PNEUMONIAE (A)  Final   Report Status 09/19/2023 FINAL  Final   Organism ID, Bacteria KLEBSIELLA PNEUMONIAE  Final   Organism ID, Bacteria KLEBSIELLA PNEUMONIAE  Final      Susceptibility   Klebsiella pneumoniae - KIRBY BAUER*    CEFAZOLIN SENSITIVE  Sensitive    Klebsiella pneumoniae -  MIC*    AMPICILLIN RESISTANT Resistant     CEFEPIME <=0.12 SENSITIVE Sensitive     CEFTAZIDIME <=1 SENSITIVE Sensitive     CEFTRIAXONE <=0.25 SENSITIVE Sensitive     CIPROFLOXACIN <=0.25 SENSITIVE Sensitive     GENTAMICIN <=1 SENSITIVE Sensitive     IMIPENEM 0.5 SENSITIVE Sensitive     TRIMETH/SULFA <=20 SENSITIVE Sensitive     AMPICILLIN/SULBACTAM <=2 SENSITIVE Sensitive     PIP/TAZO <=4 SENSITIVE Sensitive ug/mL    * KLEBSIELLA PNEUMONIAE    KLEBSIELLA PNEUMONIAE  Blood Culture ID Panel (Reflexed)     Status: Abnormal   Collection Time: 09/16/23  9:56 PM  Result Value Ref Range Status   Enterococcus faecalis NOT DETECTED NOT DETECTED Final   Enterococcus Faecium NOT DETECTED NOT DETECTED Final   Listeria monocytogenes NOT DETECTED NOT DETECTED Final   Staphylococcus species NOT DETECTED NOT DETECTED Final   Staphylococcus aureus (BCID) NOT DETECTED NOT DETECTED Final   Staphylococcus epidermidis NOT DETECTED NOT DETECTED Final   Staphylococcus lugdunensis NOT DETECTED NOT DETECTED Final   Streptococcus species NOT DETECTED NOT DETECTED Final   Streptococcus agalactiae NOT DETECTED NOT DETECTED Final   Streptococcus pneumoniae NOT DETECTED NOT DETECTED Final   Streptococcus pyogenes NOT DETECTED NOT DETECTED Final   A.calcoaceticus-baumannii NOT DETECTED NOT DETECTED Final   Bacteroides fragilis NOT DETECTED NOT DETECTED Final   Enterobacterales DETECTED (A) NOT DETECTED Final    Comment: Enterobacterales represent a large order of gram negative bacteria, not a single organism. CRITICAL RESULT CALLED TO, READ BACK BY AND VERIFIED WITH: RN DOMINIC CITRONE ON 09/17/23 @ 1755 BY DRT    Enterobacter cloacae complex NOT DETECTED NOT DETECTED Final   Escherichia coli NOT DETECTED NOT DETECTED Final   Klebsiella aerogenes NOT DETECTED NOT DETECTED Final   Klebsiella oxytoca NOT DETECTED NOT DETECTED Final   Klebsiella pneumoniae DETECTED (A) NOT DETECTED Final    Comment: CRITICAL  RESULT CALLED TO, READ BACK BY AND VERIFIED WITH: RN DOMINIC CITRONE ON 09/17/23 @ 1755 BY DRT    Proteus species NOT DETECTED NOT DETECTED Final   Salmonella species NOT DETECTED NOT DETECTED Final   Serratia marcescens NOT DETECTED NOT DETECTED Final   Haemophilus influenzae NOT DETECTED NOT DETECTED Final   Neisseria meningitidis NOT DETECTED NOT DETECTED Final   Pseudomonas aeruginosa NOT DETECTED NOT DETECTED Final   Stenotrophomonas maltophilia NOT DETECTED NOT DETECTED Final   Candida albicans NOT DETECTED NOT DETECTED Final   Candida auris NOT DETECTED NOT DETECTED Final   Candida glabrata NOT DETECTED NOT DETECTED Final   Candida krusei NOT DETECTED NOT DETECTED Final   Candida parapsilosis NOT DETECTED NOT DETECTED Final   Candida tropicalis NOT DETECTED NOT DETECTED Final   Cryptococcus neoformans/gattii NOT DETECTED NOT DETECTED Final   CTX-M ESBL NOT DETECTED NOT DETECTED Final   Carbapenem resistance IMP NOT DETECTED NOT DETECTED Final   Carbapenem resistance KPC NOT DETECTED NOT DETECTED Final   Carbapenem resistance NDM NOT DETECTED NOT DETECTED Final   Carbapenem resist OXA 48 LIKE NOT DETECTED NOT DETECTED Final   Carbapenem resistance VIM NOT DETECTED NOT DETECTED Final    Comment: Performed at [facility], [address] [place], [place] [address]  Blood culture (routine x 2)     Status: None   Collection Time: 09/16/23 10:04 PM   Specimen: Left Antecubital; Blood  Result Value Ref Range Status   Specimen Description LEFT ANTECUBITAL  Final   Special Requests   Final    BOTTLES DRAWN AEROBIC AND ANAEROBIC Blood Culture adequate volume   Culture   Final    NO GROWTH 5 DAYS Performed at [facility], [address], [place], [place] [address]    Report Status 09/21/2023 FINAL  Final  Culture, blood (Routine X 2) w Reflex to ID Panel     Status: None (Preliminary result)   Collection Time: 09/19/23 10:12 AM   Specimen: BLOOD RIGHT HAND  Result Value Ref  Range Status   Specimen Description BLOOD RIGHT HAND  Final   Special Requests   Final    BOTTLES DRAWN AEROBIC AND ANAEROBIC Blood Culture results may not be optimal due to an inadequate volume of blood received in culture bottles   Culture   Final    NO GROWTH 2 DAYS Performed at [facility], [address], [place], [place] [address]    Report Status PENDING  Incomplete  Culture, blood (Routine X 2) w Reflex to ID Panel     Status: None (Preliminary result)   Collection Time: 09/19/23 10:12 AM   Specimen: BLOOD LEFT ARM  Result Value Ref Range Status   Specimen Description BLOOD LEFT ARM  Final   Special Requests   Final    BOTTLES DRAWN AEROBIC AND ANAEROBIC Blood Culture results may not be optimal due to an inadequate volume of blood received in culture bottles   Culture   Final    NO GROWTH 2 DAYS Performed at [facility], [address] [place], [place] [address]    Report Status PENDING  Incomplete    Labs: CBC: Recent Labs  Lab 09/17/23 0428 09/18/23 0400 09/19/23 0624 09/20/23 0627 09/21/23 0624  WBC 11.2* 6.5 4.5 5.7 6.7  NEUTROABS  --  5.2 3.4 3.8 5.3  HGB 10.1* 9.8* 9.6* 10.8* 10.9*  HCT 29.6* 28.8* 28.1* 31.5* 31.1*  MCV 100.3* 99.0 97.6 96.0 94.8  PLT 124* 109* 101* 128* 159   Basic Metabolic Panel: Recent Labs  Lab 09/17/23 0428 09/18/23 0400 09/19/23 0624 09/20/23 0627 09/21/23 0624  NA 137 134* 136 138 137  K 3.5 3.3* 3.6 3.9 4.0  CL 107 105 110 109 104  CO2 22 19* 19* 21* 22  GLUCOSE 107* 108* 103* 105* 128*  BUN 28* 30* 23 15 18   CREATININE 1.38* 1.54* 1.86* 1.34* 1.28*  CALCIUM 8.2* 8.1* 8.5* 9.0 8.7*   Liver Function Tests: Recent Labs  Lab 09/17/23 0428 09/18/23 0400 09/19/23 0624 09/20/23 0627 09/21/23 0624  AST 285* 120* 95* 92* 61*  ALT 316* 192* 141* 126* 71*  ALKPHOS 207* 187* 195* 262* 214*  BILITOT 3.8* 3.4* 2.4* 2.2* 1.4*  PROT 5.5* 5.4* 5.1* 5.4* 5.5*  ALBUMIN 3.0* 2.7* 2.5* 2.5* 2.6*   CBG: Recent  Labs  Lab 09/20/23 1620 09/20/23 2037 09/21/23 0012 09/21/23 0435 09/21/23 0858  GLUCAP 181* 230* 146* 125* 124*    Discharge time spent: less than 30 minutes.  Signed: Deanna Artis, DO [facility] 09/21/2023

## 2023-09-21 NOTE — Progress Notes (Signed)
Pharmacy Consult for heparin Indication:  factor V leiden mutation/ h/o DVT  Allergies  Allergen Reactions   Gadobenate Nausea And Vomiting    Immediately upon the infusion of 15mL multihance contrast  Patient had exteme nausea and vomiting.   No other symptoms noted .  No injury.  MRI scan was completed after patient felt better.   Immediately upon the infusion of 15mL multihance contrast  Patient had exteme nausea and vomiting.   No other symptoms noted .  No injury.  MRI scan was completed after patient felt better.       Patient Measurements: Height: 5\' 10"  (177.8 cm) Weight: 90.7 kg (200 lb) IBW/kg (Calculated) : 73 HEPARIN DW (KG): 90.7  Vital Signs: Temp: 98.3 F (36.8 C) (02/20 2036) Temp Source: Oral (02/20 2036) BP: 123/67 (02/20 2036) Pulse Rate: 56 (02/20 2036)  Labs: Recent Labs    09/19/23 0624 09/19/23 1819 09/20/23 0627 09/21/23 0624  HGB 9.6*  --  10.8* 10.9*  HCT 28.1*  --  31.5* 31.1*  PLT 101*  --  128* 159  APTT  --  77*  --  120*  HEPARINUNFRC  --   --   --  0.51  CREATININE 1.86*  --  1.34* 1.28*    Estimated Creatinine Clearance: 46.9 mL/min (A) (by C-G formula based on SCr of 1.28 mg/dL (H)).  Assessment: Herbert Wood a 89 y.o. male requiring ERCP per GI- this is planned for 2/20. Pharmacy has been consulted for heparin d/t patient's risk of developing DVT  dosing.   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 This will require aPTT/HL correlation before transitioning to only heparin level monitoring. - LD 2/18 AM   Now s/p ERCP - no plans for surgery Eliquis to resume in 72 hours  Goal of Therapy:  Heparin level 0.3-0.5 Monitor platelets by anticoagulation protocol: Yes   Plan:  Heparin to 1200 units / hr Daily heparin level, CBC F/U restart DOAC - > 2/23?  Thank you Okey Regal, PharmD

## 2023-09-21 NOTE — Progress Notes (Addendum)
Gastroenterology Inpatient Follow Up    Subjective: Patient's back pain has resolved.  He has tolerated clear liquids without issues.  He is feeling much better overall.  He is hungry and eager to eat more.  Objective: Vital signs in last 24 hours: Temp:  [97.4 F (36.3 C)-98.3 F (36.8 C)] 98.2 F (36.8 C) (02/21 0906) Pulse Rate:  [56-68] 56 (02/21 0906) Resp:  [14-19] 18 (02/21 0906) BP: (119-140)/(61-68) 140/61 (02/21 0906) SpO2:  [95 %-100 %] 97 % (02/21 0906) Last BM Date : 09/20/23  Intake/Output from previous day: 02/20 0701 - 02/21 0700 In: 898.1 [I.V.:698.1; IV Piggyback:200] Out: 201 [Urine:200; Blood:1] Intake/Output this shift: No intake/output data recorded.  General appearance: alert and cooperative Resp: No increased work of breathing Cardio: Regular rate GI: Nontender, nondistended  Lab Results: Recent Labs    09/19/23 0624 09/20/23 0627 09/21/23 0624  WBC 4.5 5.7 6.7  HGB 9.6* 10.8* 10.9*  HCT 28.1* 31.5* 31.1*  PLT 101* 128* 159   BMET Recent Labs    09/19/23 0624 09/20/23 0627 09/21/23 0624  NA 136 138 137  K 3.6 3.9 4.0  CL 110 109 104  CO2 19* 21* 22  GLUCOSE 103* 105* 128*  BUN 23 15 18   CREATININE 1.86* 1.34* 1.28*  CALCIUM 8.5* 9.0 8.7*   LFT Recent Labs    09/21/23 0624  PROT 5.5*  ALBUMIN 2.6*  AST 61*  ALT 71*  ALKPHOS 214*  BILITOT 1.4*   PT/INR No results for input(s): "LABPROT", "INR" in the last 72 hours. Hepatitis Panel No results for input(s): "HEPBSAG", "HCVAB", "HEPAIGM", "HEPBIGM" in the last 72 hours. C-Diff No results for input(s): "CDIFFTOX" in the last 72 hours.  Studies/Results: DG ERCP Result Date: 09/20/2023 CLINICAL DATA:  Choledocholithiasis. EXAM: ERCP TECHNIQUE: Multiple spot images obtained with the fluoroscopic device and submitted for interpretation post-procedure. COMPARISON:  MRI/MRCP on 09/18/2023 FINDINGS: Imaging obtained with a C-arm demonstrates cannulation of the common bile  duct with cholangiogram demonstrating discrete filling defects within the proximal and mid aspects of a dilated common bile duct. Balloon sweep maneuver was performed to remove calculi. Completion cholangiogram shows no further filling defects. IMPRESSION: Choledocholithiasis confirmed in the common bile duct with balloon sweep maneuver to remove calculi. These images were submitted for radiologic interpretation only. Please see the procedural report for the amount of contrast and the fluoroscopy time utilized. Electronically Signed   By: Irish Lack M.D.   On: 09/20/2023 16:20    Medications: I have reviewed the patient's current medications. Scheduled:  amitriptyline  10 mg Oral QPM   amLODipine  5 mg Oral BID   ascorbic acid  1,000 mg Oral QPM   carvedilol  6.25 mg Oral BID WC   cholecalciferol  5,000 Units Oral QPM   cyanocobalamin  1,000 mcg Oral QPM   dorzolamide  1 drop Left Eye BID   ferrous sulfate  325 mg Oral Q breakfast   Finerenone  10 mg Oral Daily   folic acid  1 mg Oral QPM   hydrALAZINE  50 mg Oral BID   insulin aspart  0-15 Units Subcutaneous TID WC   latanoprost  1 drop Both Eyes QHS   linagliptin  5 mg Oral q AM   loratadine  10 mg Oral q morning   pantoprazole  40 mg Oral BID AC   Continuous:   ceFAZolin (ANCEF) IV 2 g (09/21/23 4098)   heparin 1,200 Units/hr (09/21/23 0934)   JXB:[id number] **OR** acetaminophen,  magnesium hydroxide, ondansetron **OR** ondansetron (ZOFRAN) IV, traZODone  Assessment/Plan: 89 year old male with history of diabetes, CKD, LLE DVT on Eliquis, colon cancer with liver metastasis, GERD, and prior diverticulitis presented with back pain, nausea, vomiting, and fever.  Found to have cholangitis with Klebsiella bacteremia.  ERCP yesterday with biliary sphincterotomy and balloon sweeping performed with removal of at least 2 CBD stones.  LFTs do appear to be decreasing today and are closer to his prior baseline.  Hemoglobin appears stable.   Patient does not demonstrate any signs of post ERCP complications.  - Advanced to heart healthy diet - Continue antibiotics for total of 5 days after ERCP procedure - Resume Eliquis in 72 hours. If heparin is needed, can restart in 48 hours. - Surgery has seen the patient and do not recommend cholecystectomy at this time - GI will sign off for now.  Please call back if any new questions arise.   LOS: 5 days   Imogene Burn 09/21/2023, 11:32 AM

## 2023-09-21 NOTE — Progress Notes (Signed)
1 Day Post-Op  Subjective: Patient feeling even better today after ERCP and stone removal yesterday.  2BMs last night.  Drinking liquids.  Pain in the left side of his back has improved and resolved post ERCP.  ROS: See above, otherwise other systems negative  Objective: Vital signs in last 24 hours: Temp:  [97.4 F (36.3 C)-98.3 F (36.8 C)] 98.3 F (36.8 C) (02/20 2036) Pulse Rate:  [56-68] 56 (02/20 2036) Resp:  [14-19] 17 (02/20 2036) BP: (119-138)/(63-68) 123/67 (02/20 2036) SpO2:  [95 %-100 %] 98 % (02/20 2036) Last BM Date : 09/20/23  Intake/Output from previous day: 02/20 0701 - 02/21 0700 In: 898.1 [I.V.:698.1; IV Piggyback:200] Out: 201 [Urine:200; Blood:1] Intake/Output this shift: No intake/output data recorded.  PE: Gen: NAD Abd: soft, NT, ND Skin: jaundice, improved from yesterday  Lab Results:  Recent Labs    09/20/23 0627 09/21/23 0624  WBC 5.7 6.7  HGB 10.8* 10.9*  HCT 31.5* 31.1*  PLT 128* 159   BMET Recent Labs    09/20/23 0627 09/21/23 0624  NA 138 137  K 3.9 4.0  CL 109 104  CO2 21* 22  GLUCOSE 105* 128*  BUN 15 18  CREATININE 1.34* 1.28*  CALCIUM 9.0 8.7*   PT/INR No results for input(s): "LABPROT", "INR" in the last 72 hours. CMP     Component Value Date/Time   NA 137 09/21/2023 0624   K 4.0 09/21/2023 0624   CL 104 09/21/2023 0624   CO2 22 09/21/2023 0624   GLUCOSE 128 (H) 09/21/2023 0624   BUN 18 09/21/2023 0624   CREATININE 1.28 (H) 09/21/2023 0624   CALCIUM 8.7 (L) 09/21/2023 0624   PROT 5.5 (L) 09/21/2023 0624   ALBUMIN 2.6 (L) 09/21/2023 0624   AST 61 (H) 09/21/2023 0624   ALT 71 (H) 09/21/2023 0624   ALKPHOS 214 (H) 09/21/2023 0624   BILITOT 1.4 (H) 09/21/2023 0624   GFRNONAA 55 (L) 09/21/2023 0624   Lipase     Component Value Date/Time   LIPASE 29 11/01/2021 1023       Studies/Results: DG ERCP Result Date: 09/20/2023 CLINICAL DATA:  Choledocholithiasis. EXAM: ERCP TECHNIQUE: Multiple spot images  obtained with the fluoroscopic device and submitted for interpretation post-procedure. COMPARISON:  MRI/MRCP on 09/18/2023 FINDINGS: Imaging obtained with a C-arm demonstrates cannulation of the common bile duct with cholangiogram demonstrating discrete filling defects within the proximal and mid aspects of a dilated common bile duct. Balloon sweep maneuver was performed to remove calculi. Completion cholangiogram shows no further filling defects. IMPRESSION: Choledocholithiasis confirmed in the common bile duct with balloon sweep maneuver to remove calculi. These images were submitted for radiologic interpretation only. Please see the procedural report for the amount of contrast and the fluoroscopy time utilized. Electronically Signed   By: Irish Lack M.D.   On: 09/20/2023 16:20    Anti-infectives: Anti-infectives (From admission, onward)    Start     Dose/Rate Route Frequency Ordered Stop   09/20/23 0900  ceFAZolin (ANCEF) IVPB 2g/100 mL premix        2 g 200 mL/hr over 30 Minutes Intravenous Every 8 hours 09/20/23 0803     09/18/23 1200  cefTRIAXone (ROCEPHIN) 2 g in sodium chloride 0.9 % 100 mL IVPB  Status:  Discontinued        2 g 200 mL/hr over 30 Minutes Intravenous Every 24 hours 09/18/23 1106 09/20/23 0803   09/17/23 2200  vancomycin (VANCOREADY) IVPB 1250 mg/250 mL  Status:  Discontinued        1,250 mg 166.7 mL/hr over 90 Minutes Intravenous Every 24 hours 09/16/23 2247 09/18/23 0746   09/17/23 0700  ceFEPIme (MAXIPIME) 2 g in sodium chloride 0.9 % 100 mL IVPB  Status:  Discontinued        2 g 200 mL/hr over 30 Minutes Intravenous  Once 09/16/23 2154 09/16/23 2247   09/16/23 2315  vancomycin (VANCOCIN) IVPB 1000 mg/200 mL premix        1,000 mg 200 mL/hr over 60 Minutes Intravenous  Once 09/16/23 2154 09/17/23 0405   09/16/23 2300  ceFEPIme (MAXIPIME) 2 g in sodium chloride 0.9 % 100 mL IVPB  Status:  Discontinued        2 g 200 mL/hr over 30 Minutes Intravenous Every 12  hours 09/16/23 2247 09/18/23 1106   09/16/23 2200  metroNIDAZOLE (FLAGYL) IVPB 500 mg  Status:  Discontinued        500 mg 100 mL/hr over 60 Minutes Intravenous Every 12 hours 09/16/23 2154 09/18/23 1106   09/16/23 2115  cefTRIAXone (ROCEPHIN) 1 g in sodium chloride 0.9 % 100 mL IVPB        1 g 200 mL/hr over 30 Minutes Intravenous  Once 09/16/23 2114 09/16/23 2301   09/16/23 2115  metroNIDAZOLE (FLAGYL) IVPB 500 mg  Status:  Discontinued        500 mg 100 mL/hr over 60 Minutes Intravenous Every 12 hours 09/16/23 2114 09/16/23 2159        Assessment/Plan Choledocholithiasis with possible cholangitis in setting of 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  Feeling much better -recommendations outlined in our note from yesterday, but no plans for surgical intervention for reasons stated yesterday -patient and wife are both happy with this and do not wish for surgery at this time -he may follow up with [place] [facility] -seen with primary service -we will sign off at this time     FEN - diet per GI post ERCP VTE - Eliquis on hold ID - ancef   Klebsiella bacteremia - likely secondary to cholangitis.  Ancef, per primary Reducible incisional hernia HTN HLD Factor V Leiden's H/O RLE DVT - on Eliquis DM  I reviewed Consultant GI notes, hospitalist notes, last 24 h vitals and pain scores, last 48 h intake and output, last 24 h labs and trends, and last 24 h imaging results.   LOS: 5 days    Letha Cape , [facility] Surgery 09/21/2023, 8:47 AM Please see Amion for pager number during day hours 7:00am-4:30pm or 7:00am -11:30am on weekends

## 2023-09-21 NOTE — Anesthesia Postprocedure Evaluation (Signed)
Anesthesia Post Note  Patient: Muscab Brenneman  Procedure(s) Performed: ENDOSCOPIC RETROGRADE CHOLANGIOPANCREATOGRAPHY (ERCP) WITH PROPOFOL SPHINCTEROTOMY SPHINCTEROPLASTY REMOVAL OF STONES     Patient location during evaluation: PACU Anesthesia Type: General Level of consciousness: awake and alert Pain management: pain level controlled Vital Signs Assessment: post-procedure vital signs reviewed and stable Respiratory status: spontaneous breathing, nonlabored ventilation, respiratory function stable and patient connected to nasal cannula oxygen Cardiovascular status: blood pressure returned to baseline and stable Postop Assessment: no apparent nausea or vomiting Anesthetic complications: no   No notable events documented.  Last Vitals:  Vitals:   09/20/23 2036 09/21/23 0906  BP: 123/67 (!) 140/61  Pulse: (!) 56 (!) 56  Resp: 17 18  Temp: 36.8 C 36.8 C  SpO2: 98% 97%    Last Pain:  Vitals:   09/20/23 2036  TempSrc: Oral  PainSc:                  [place]

## 2023-09-23 ENCOUNTER — Encounter (HOSPITAL_COMMUNITY): Payer: Self-pay | Admitting: Gastroenterology

## 2023-09-24 LAB — CULTURE, BLOOD (ROUTINE X 2)
Culture: NO GROWTH
Culture: NO GROWTH

## 2023-09-25 ENCOUNTER — Other Ambulatory Visit (HOSPITAL_COMMUNITY): Payer: Self-pay

## 2023-09-27 ENCOUNTER — Other Ambulatory Visit (HOSPITAL_COMMUNITY): Payer: Self-pay

## 2023-09-29 DIAGNOSIS — R7989 Other specified abnormal findings of blood chemistry: Secondary | ICD-10-CM

## 2023-10-01 ENCOUNTER — Inpatient Hospital Stay: Payer: Medicare Other | Admitting: Hematology

## 2023-10-01 NOTE — Progress Notes (Signed)
 SURGICAL WAITING ROOM VISITATION  Patients having surgery or a procedure may have no more than 2 support people in the waiting area - these visitors may rotate.    Children under the age of 46 must have an adult with them who is not the patient.  Due to an increase in RSV and influenza rates and associated hospitalizations, children ages 1 and under may not visit patients in [facility] hospitals.  Visitors with respiratory illnesses are discouraged from visiting and should remain at home.  If the patient needs to stay at the hospital during part of their recovery, the visitor guidelines for inpatient rooms apply. Pre-op nurse will coordinate an appropriate time for 1 support person to accompany patient in pre-op.  This support person may not rotate.    Please refer to the [facility] website for the visitor guidelines for Inpatients (after your surgery is over and you are in a regular room).       Your procedure is scheduled on:  10/10/2023    Report to [facility] Main Entrance    Report to admitting at  [phone] AM   Call this number if you have problems the morning of surgery [phone]   Do not eat food or drink liquids  :After Midnight.               Oral Hygiene is also important to reduce your risk of infection.                                    Remember - BRUSH YOUR TEETH THE MORNING OF SURGERY WITH YOUR REGULAR TOOTHPASTE  DENTURES WILL BE REMOVED PRIOR TO SURGERY PLEASE DO NOT APPLY "Poly grip" OR ADHESIVES!!!   Do NOT smoke after Midnight   Stop all vitamins and herbal supplements 7 days before surgery.   Take these medicines the morning of surgery with A SIP OF WATER:  amlodipoine, coreg, eye drops as usual , hydralazine, omeprazole, claritin   DO NOT TAKE ANY ORAL DIABETIC MEDICATIONS DAY OF YOUR SURGERY  Bring CPAP mask and tubing day of surgery.                              You may not have any metal on your body including hair pins, jewelry,  and body piercing             Do not wear make-up, lotions, powders, perfumes/cologne, or deodorant  Do not wear nail polish including gel and S&S, artificial/acrylic nails, or any other type of covering on natural nails including finger and toenails. If you have artificial nails, gel coating, etc. that needs to be removed by a nail salon please have this removed prior to surgery or surgery may need to be canceled/ delayed if the surgeon/ anesthesia feels like they are unable to be safely monitored.   Do not shave  48 hours prior to surgery.               Men may shave face and neck.   Do not bring valuables to the hospital. [facility] IS NOT             RESPONSIBLE   FOR VALUABLES.   Contacts, glasses, dentures or bridgework may not be worn into surgery.   Bring small overnight bag day of surgery.   DO NOT BRING YOUR  HOME MEDICATIONS TO THE HOSPITAL. PHARMACY WILL DISPENSE MEDICATIONS LISTED ON YOUR MEDICATION LIST TO YOU DURING YOUR ADMISSION IN THE HOSPITAL!    Patients discharged on the day of surgery will not be allowed to drive home.  Someone NEEDS to stay with you for the first 24 hours after anesthesia.   Special Instructions: Bring a copy of your healthcare power of attorney and living will documents the day of surgery if you haven't scanned them before.              Please read over the following fact sheets you were given: IF YOU HAVE QUESTIONS ABOUT YOUR PRE-OP INSTRUCTIONS PLEASE CALL [phone]   If you received a COVID test during your pre-op visit  it is requested that you wear a mask when out in public, stay away from anyone that may not be feeling well and notify your surgeon if you develop symptoms. If you test positive for Covid or have been in contact with anyone that has tested positive in the last 10 days please notify you surgeon.    [place] - Preparing for Surgery Before surgery, you can play an important role.  Because skin is not sterile, your skin  needs to be as free of germs as possible.  You can reduce the number of germs on your skin by washing with CHG (chlorahexidine gluconate) soap before surgery.  CHG is an antiseptic cleaner which kills germs and bonds with the skin to continue killing germs even after washing. Please DO NOT use if you have an allergy to CHG or antibacterial soaps.  If your skin becomes reddened/irritated stop using the CHG and inform your nurse when you arrive at Short Stay. Do not shave (including legs and underarms) for at least 48 hours prior to the first CHG shower.  You may shave your face/neck. Please follow these instructions carefully:  1.  Shower with CHG Soap the night before surgery and the  morning of Surgery.  2.  If you choose to wash your hair, wash your hair first as usual with your  normal  shampoo.  3.  After you shampoo, rinse your hair and body thoroughly to remove the  shampoo.                           4.  Use CHG as you would any other liquid soap.  You can apply chg directly  to the skin and wash                       Gently with a scrungie or clean washcloth.  5.  Apply the CHG Soap to your body ONLY FROM THE NECK DOWN.   Do not use on face/ open                           Wound or open sores. Avoid contact with eyes, ears mouth and genitals (private parts).                       Wash face,  Genitals (private parts) with your normal soap.             6.  Wash thoroughly, paying special attention to the area where your surgery  will be performed.  7.  Thoroughly rinse your body with warm water from the neck down.  8.  DO NOT shower/wash  with your normal soap after using and rinsing off  the CHG Soap.                9.  Pat yourself dry with a clean towel.            10.  Wear clean pajamas.            11.  Place clean sheets on your bed the night of your first shower and do not  sleep with pets. Day of Surgery : Do not apply any lotions/deodorants the morning of surgery.  Please wear clean  clothes to the hospital/surgery center.  FAILURE TO FOLLOW THESE INSTRUCTIONS MAY RESULT IN THE CANCELLATION OF YOUR SURGERY PATIENT SIGNATURE_________________________________  NURSE SIGNATURE__________________________________  ________________________________________________________________________

## 2023-10-02 ENCOUNTER — Encounter (HOSPITAL_COMMUNITY)
Admit: 2023-10-02 | Discharge: 2023-10-02 | Disposition: A | Discharge status: Home / Self Care | Payer: Medicare Other | Attending: Interventional Radiology | Admitting: Interventional Radiology

## 2023-10-02 ENCOUNTER — Encounter (HOSPITAL_COMMUNITY): Payer: Self-pay | Admitting: Interventional Radiology

## 2023-10-02 ENCOUNTER — Other Ambulatory Visit: Payer: Self-pay

## 2023-10-02 NOTE — Progress Notes (Addendum)
 Anesthesia Review:  PCP: Jane Canary 09/27/23 [facility]- have requested ov note from 2/27 and  labs frorm 09/25/23. [phone] Cardiologist : DR Daryel November  [facility] 12/2022  [phone] Called and requested most recent ov note and EKG and cardiac tests.  PPM/ ICD: Device Orders: Rep Notified:  Chest x-ray : 09/16/23 Ct Angio chest- 09/16/23  EKG : 09/17/23 ERCP- 09/20/23  Echo : Stress test: Cardiac Cath :   Activity level:  does  not use cane or walker walks on own unsure of stamina due to receeent hospitaliization  Sleep Study/ CPAP : none Fasting Blood Sugar :      / Checks Blood Sugar -- times a day:    Blood Thinner/ Instructions /Last Dose: ASA / Instructions/ Last Dose :     Eliiquis- last dose on 10/07/2023   09/16/23- IN ED with atypical chest pain   DM- type 2 -does not checkk glucose at home  Hgba1c-  -09/07/23- 6.1 Tradjenta- none am of  surgery    Married, wife who is 53 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  Med hx and preop iinstructions completed.  Wife with pt on phone.  Wife has previous iinstructons.  Wife stated that DR [name] instructed themm to eat a light diet the day before procedure.    Wife  instructed at end of proep phone call to call Admitting at [phone].   Wiife  voiced understanding.

## 2023-10-04 NOTE — Anesthesia Preprocedure Evaluation (Addendum)
 Anesthesia Evaluation  Patient identified by MRN, date of birth, ID band Patient awake    Reviewed: Allergy & Precautions, H&P , NPO status , Patient's Chart, lab work & pertinent test results  Airway Mallampati: III  TM Distance: >3 FB Neck ROM: Full    Dental no notable dental hx. (+) Teeth Intact, Dental Advisory Given   Pulmonary neg pulmonary ROS, former smoker   Pulmonary exam normal breath sounds clear to auscultation       Cardiovascular Exercise Tolerance: Good hypertension, Pt. on medications + Peripheral Vascular Disease   Rhythm:Regular Rate:Normal     Neuro/Psych negative neurological ROS  negative psych ROS   GI/Hepatic Neg liver ROS,GERD  ,,  Endo/Other  diabetes, Type 2, Oral Hypoglycemic Agents    Renal/GU Renal InsufficiencyRenal disease  negative genitourinary   Musculoskeletal  (+) Arthritis , Osteoarthritis,    Abdominal   Peds  Hematology  (+) Blood dyscrasia, anemia   Anesthesia Other Findings   Reproductive/Obstetrics negative OB ROS                             Anesthesia Physical Anesthesia Plan  ASA: 3  Anesthesia Plan: General   Post-op Pain Management:    Induction: Intravenous  PONV Risk Score and Plan: 2 and Ondansetron and Dexamethasone  Airway Management Planned: Oral ETT  Additional Equipment:   Intra-op Plan:   Post-operative Plan: Extubation in OR  Informed Consent: I have reviewed the patients History and Physical, chart, labs and discussed the procedure including the risks, benefits and alternatives for the proposed anesthesia with the patient or authorized representative who has indicated his/her understanding and acceptance.     Dental advisory given  Plan Discussed with: CRNA  Anesthesia Plan Comments: (See PAT note 10/02/2023)       Anesthesia Quick Evaluation

## 2023-10-04 NOTE — Progress Notes (Signed)
 Anesthesia Chart Review   Case: [id number] Date/Time: 10/10/23 0830   Procedure: CT WITH ANESTHESIA MICROWAVE ABLATION OF LIVER   Anesthesia type: General   Pre-op diagnosis: COLON CANCER METASTASIZED TO LIVER   [facility]   Surgeons: Irish Lack, MD       DISCUSSION:89 y.o. 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 Irish Lack.   Pt with recent admission 2/16-2/21/2025 with sepsis due to cholangitis, choledocholithiasis. Chest xray with no acute findings. ECRP performed, given metastasis near site, general surgery defer cholecystectomy given no obvious signs of cholecystitis.   Pt had a post hospitalization follow up with PCP 09/27/2023.  Per OV note pt clinically doing much better, feeling better, afebrile.  10 more days of Augmentin prescribed.   Pt follows with cardiology for HTN and dyslipidemia.  Last seen 01/23/2023.  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  1 year follow up recommended.   Echo 01/30/2022 with LVEF 55-60%, mild aortic insufficiency, mild aortic stenosis, mild tricuspid regurgitation, moderate pulmonary HTN. (Full results under media tab)  Pt reports his last dose of Eliquis before procedure will be 10/07/2023. VS: Ht 5\' 10"  (1.778 m)   Wt 90.7 kg   BMI 28.70 kg/m   PROVIDERS: [name], MD is PCP   Daryel November, MD is Cardiologist with [facility]  LABS: Labs reviewed: Acceptable for surgery. (all labs ordered are listed, but only abnormal results are displayed)  Labs Reviewed - No data to display   IMAGES:   EKG:   CV:  Past Medical History:  Diagnosis Date   Adenomatous colon polyp    Anemia    Carotid bruit    Chronic dyspnea    Diabetes (HCC)    type 2   Diverticular disease    DVT (deep venous thrombosis) (HCC)    lower limb   Dyspnea    Factor V  deficiency (HCC)    GERD (gastroesophageal reflux disease)    Hearing loss    bilateral   Hyperlipemia    Hypertension    Inguinal hernia    Kidney disease    stage 3   Lung nodule    Lymphadenopathy    Malignant neoplasm (HCC)    salivary gland   Murmur    Osteoarthritis    Senile purpura (HCC)     Past Surgical History:  Procedure Laterality Date   BIOPSY  01/10/2022   Procedure: BIOPSY;  Surgeon: Dolores Frame, MD;  [facility];  Service: Gastroenterology;;   COLON SURGERY     COLONOSCOPY WITH PROPOFOL N/A 01/10/2022   Procedure: COLONOSCOPY WITH PROPOFOL;  Surgeon: Dolores Frame, MD;  [facility];  Service: Gastroenterology;  Laterality: N/A;  per Soledad Gerlach, pt to arrive at 9:15   COLONOSCOPY WITH PROPOFOL N/A 02/27/2023   Procedure: COLONOSCOPY WITH PROPOFOL;  Surgeon: Dolores Frame, MD;  [facility];  Service: Gastroenterology;  Laterality: N/A;  9:15am;asa 3   ENDOSCOPIC RETROGRADE CHOLANGIOPANCREATOGRAPHY (ERCP) WITH PROPOFOL N/A 09/20/2023   Procedure: ENDOSCOPIC RETROGRADE CHOLANGIOPANCREATOGRAPHY (ERCP) WITH PROPOFOL;  Surgeon: Lynann Bologna, MD;  Location: [facility] ENDOSCOPY;  Service: Gastroenterology;  Laterality: N/A;   ESOPHAGOGASTRODUODENOSCOPY (EGD) WITH PROPOFOL N/A 01/10/2022   Procedure: ESOPHAGOGASTRODUODENOSCOPY (EGD) WITH PROPOFOL;  Surgeon: Dolores Frame, MD;  [facility];  Service: Gastroenterology;  Laterality: N/A;   HAND / FINGER LESION EXCISION     HERNIA REPAIR     IR RADIOLOGIST EVAL & MGMT  04/04/2023   IR RADIOLOGIST EVAL & MGMT  08/09/2023   MASTOIDECTOMY     PILONIDAL CYST EXCISION     POLYPECTOMY  01/10/2022   Procedure: POLYPECTOMY;  Surgeon: Dolores Frame, MD;  [facility];  Service: Gastroenterology;;   POLYPECTOMY  02/27/2023   Procedure: POLYPECTOMY;  Surgeon: Dolores Frame, MD;  [facility];   Service: Gastroenterology;;   RADIOLOGY WITH ANESTHESIA N/A 12/27/2022   Procedure: CT microwave ablation of the liver;  Surgeon: Irish Lack, MD;  [facility];  Service: Radiology;  Laterality: N/A;   REMOVAL OF STONES  09/20/2023   Procedure: REMOVAL OF STONES;  Surgeon: Lynann Bologna, MD;  Location: [facility] ENDOSCOPY;  Service: Gastroenterology;;   Dennison Mascot  09/20/2023   Procedure: SPHINCTEROTOMY;  Surgeon: Lynann Bologna, MD;  Location: [facility] ENDOSCOPY;  Service: Gastroenterology;;   Dennison Mascot  09/20/2023   Procedure: SPHINCTEROPLASTY;  Surgeon: Lynann Bologna, MD;  Location: [facility] ENDOSCOPY;  Service: Gastroenterology;;   SUBMUCOSAL TATTOO INJECTION  01/10/2022   Procedure: SUBMUCOSAL TATTOO INJECTION;  Surgeon: Marguerita Merles, Reuel Boom, MD;  [facility];  Service: Gastroenterology;;   SURGERY OF LIP     skin cancer    MEDICATIONS:  amitriptyline (ELAVIL) 10 MG tablet   amLODipine (NORVASC) 5 MG tablet   amoxicillin-clavulanate (AUGMENTIN) 875-125 MG tablet   ascorbic acid (VITAMIN C/NATURAL ROSE HIPS) 1000 MG tablet   atorvastatin (LIPITOR) 40 MG tablet   carvedilol (COREG) 6.25 MG tablet   Cholecalciferol (VITAMIN D3) 125 MCG (5000 UT) CAPS   dorzolamide (TRUSOPT) 2 % ophthalmic solution   ELIQUIS 5 MG TABS tablet   ferrous sulfate 325 (65 FE) MG tablet   folic acid (FOLVITE) 1 MG tablet   hydrALAZINE (APRESOLINE) 25 MG tablet   HYDROcodone-acetaminophen (NORCO) 5-325 MG tablet   KERENDIA 10 MG TABS   latanoprost (XALATAN) 0.005 % ophthalmic solution   linagliptin (TRADJENTA) 5 MG TABS tablet   loratadine (CLARITIN) 10 MG tablet   Misc Natural Products (PROSTATE SUPPORT PO)   omeprazole (PRILOSEC) 40 MG capsule   ondansetron (ZOFRAN) 4 MG tablet   ondansetron (ZOFRAN-ODT) 4 MG disintegrating tablet   tobramycin (TOBREX) 0.3 % ophthalmic solution   vitamin B-12 (CYANOCOBALAMIN) 1000 MCG tablet   No current facility-administered medications for this  encounter.   Jodell Cipro Ward, PA-C [facility] Pre-Surgical Testing [phone]

## 2023-10-05 ENCOUNTER — Other Ambulatory Visit: Payer: Self-pay

## 2023-10-05 DIAGNOSIS — D509 Iron deficiency anemia, unspecified: Secondary | ICD-10-CM

## 2023-10-05 DIAGNOSIS — C187 Malignant neoplasm of sigmoid colon: Secondary | ICD-10-CM

## 2023-10-05 DIAGNOSIS — N183 Chronic kidney disease, stage 3 unspecified: Secondary | ICD-10-CM

## 2023-10-08 ENCOUNTER — Inpatient Hospital Stay: Payer: Medicare Other | Attending: Hematology

## 2023-10-08 DIAGNOSIS — D509 Iron deficiency anemia, unspecified: Secondary | ICD-10-CM | POA: Diagnosis not present

## 2023-10-08 DIAGNOSIS — N189 Chronic kidney disease, unspecified: Secondary | ICD-10-CM | POA: Diagnosis not present

## 2023-10-08 DIAGNOSIS — Z79899 Other long term (current) drug therapy: Secondary | ICD-10-CM | POA: Insufficient documentation

## 2023-10-08 DIAGNOSIS — Z7984 Long term (current) use of oral hypoglycemic drugs: Secondary | ICD-10-CM | POA: Insufficient documentation

## 2023-10-08 DIAGNOSIS — I129 Hypertensive chronic kidney disease with stage 1 through stage 4 chronic kidney disease, or unspecified chronic kidney disease: Secondary | ICD-10-CM | POA: Diagnosis not present

## 2023-10-08 DIAGNOSIS — E1122 Type 2 diabetes mellitus with diabetic chronic kidney disease: Secondary | ICD-10-CM | POA: Insufficient documentation

## 2023-10-08 DIAGNOSIS — Z87891 Personal history of nicotine dependence: Secondary | ICD-10-CM | POA: Insufficient documentation

## 2023-10-08 DIAGNOSIS — Z7901 Long term (current) use of anticoagulants: Secondary | ICD-10-CM | POA: Insufficient documentation

## 2023-10-08 DIAGNOSIS — Z86718 Personal history of other venous thrombosis and embolism: Secondary | ICD-10-CM | POA: Insufficient documentation

## 2023-10-08 DIAGNOSIS — C187 Malignant neoplasm of sigmoid colon: Secondary | ICD-10-CM

## 2023-10-08 DIAGNOSIS — M545 Low back pain, unspecified: Secondary | ICD-10-CM | POA: Diagnosis not present

## 2023-10-08 DIAGNOSIS — Z85038 Personal history of other malignant neoplasm of large intestine: Secondary | ICD-10-CM | POA: Diagnosis present

## 2023-10-08 DIAGNOSIS — N183 Chronic kidney disease, stage 3 unspecified: Secondary | ICD-10-CM

## 2023-10-08 LAB — COMPREHENSIVE METABOLIC PANEL
ALT: 50 U/L — ABNORMAL HIGH (ref 0–44)
AST: 46 U/L — ABNORMAL HIGH (ref 15–41)
Albumin: 3.4 g/dL — ABNORMAL LOW (ref 3.5–5.0)
Alkaline Phosphatase: 169 U/L — ABNORMAL HIGH (ref 38–126)
Anion gap: 9 (ref 5–15)
BUN: 27 mg/dL — ABNORMAL HIGH (ref 8–23)
CO2: 23 mmol/L (ref 22–32)
Calcium: 9.1 mg/dL (ref 8.9–10.3)
Chloride: 106 mmol/L (ref 98–111)
Creatinine, Ser: 1.3 mg/dL — ABNORMAL HIGH (ref 0.61–1.24)
GFR, Estimated: 53 mL/min — ABNORMAL LOW (ref >60)
Glucose, Bld: 147 mg/dL — ABNORMAL HIGH (ref 70–99)
Potassium: 4.1 mmol/L (ref 3.5–5.1)
Sodium: 138 mmol/L (ref 135–145)
Total Bilirubin: 1.9 mg/dL — ABNORMAL HIGH (ref 0.0–1.2)
Total Protein: 6.8 g/dL (ref 6.5–8.1)

## 2023-10-08 LAB — CBC WITH DIFFERENTIAL/PLATELET
Abs Immature Granulocytes: 0.03 10*3/uL (ref 0.00–0.07)
Basophils Absolute: 0 10*3/uL (ref 0.0–0.1)
Basophils Relative: 1 %
Eosinophils Absolute: 0.1 10*3/uL (ref 0.0–0.5)
Eosinophils Relative: 1 %
HCT: 34.2 % — ABNORMAL LOW (ref 39.0–52.0)
Hemoglobin: 11.4 g/dL — ABNORMAL LOW (ref 13.0–17.0)
Immature Granulocytes: 0 %
Lymphocytes Relative: 11 %
Lymphs Abs: 0.8 10*3/uL (ref 0.7–4.0)
MCH: 33.3 pg (ref 26.0–34.0)
MCHC: 33.3 g/dL (ref 30.0–36.0)
MCV: 100 fL (ref 80.0–100.0)
Monocytes Absolute: 0.7 10*3/uL (ref 0.1–1.0)
Monocytes Relative: 10 %
Neutro Abs: 5.8 10*3/uL (ref 1.7–7.7)
Neutrophils Relative %: 77 %
Platelets: 180 10*3/uL (ref 150–400)
RBC: 3.42 MIL/uL — ABNORMAL LOW (ref 4.22–5.81)
RDW: 13.3 % (ref 11.5–15.5)
WBC: 7.5 10*3/uL (ref 4.0–10.5)
nRBC: 0 % (ref 0.0–0.2)

## 2023-10-08 LAB — IRON AND TIBC
Iron: 107 ug/dL (ref 45–182)
Saturation Ratios: 33 % (ref 17.9–39.5)
TIBC: 322 ug/dL (ref 250–450)
UIBC: 215 ug/dL

## 2023-10-08 LAB — FERRITIN: Ferritin: 168 ng/mL (ref 24–336)

## 2023-10-09 ENCOUNTER — Other Ambulatory Visit: Payer: Self-pay | Admitting: Radiology

## 2023-10-09 DIAGNOSIS — C787 Secondary malignant neoplasm of liver and intrahepatic bile duct: Secondary | ICD-10-CM

## 2023-10-09 LAB — CEA: CEA: 94.8 ng/mL — ABNORMAL HIGH (ref 0.0–4.7)

## 2023-10-09 NOTE — H&P (Shared)
 Chief Complaint: Request for CT guided tissue ablation, colon carcinoma with metastasis to liver  Referring Provider(s): Meredith Pel, NP   Supervising Physician: Dr. Irish Lack  Patient Status: [facility] - In-pt (TBA)  History of Present Illness: Herbert Wood is a 89 y.o. 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 He underwent a thermal ablation in May 2024 for R liver metastasis. GI placed request for another image guided thermal ablation of liver mass (09/18/23 MR Abd: Known metastasis of the inferior right lobe of the liver, hepatic segment VI.)  Patient is Full Code  Past Medical History:  Diagnosis Date   Adenomatous colon polyp    Anemia    Carotid bruit    Chronic dyspnea    Diabetes (HCC)    type 2   Diverticular disease    DVT (deep venous thrombosis) (HCC)    lower limb   Dyspnea    Factor V deficiency (HCC)    GERD (gastroesophageal reflux disease)    Hearing loss    bilateral   Hyperlipemia    Hypertension    Inguinal hernia    Kidney disease    stage 3   Lung nodule    Lymphadenopathy    Malignant neoplasm (HCC)    salivary gland   Murmur    Osteoarthritis    Senile purpura (HCC)     Past Surgical History:  Procedure Laterality Date   BIOPSY  01/10/2022   Procedure: BIOPSY;  Surgeon: Dolores Frame, MD;  Location: [facility];  Service: Gastroenterology;;   COLON SURGERY     COLONOSCOPY WITH PROPOFOL N/A 01/10/2022   Procedure: COLONOSCOPY WITH PROPOFOL;  Surgeon: Dolores Frame, MD;  Location: [facility];  Service: Gastroenterology;  Laterality: N/A;  per Soledad Gerlach, pt to arrive at 9:15   COLONOSCOPY WITH PROPOFOL N/A 02/27/2023   Procedure: COLONOSCOPY WITH PROPOFOL;  Surgeon: Dolores Frame, MD;  Location: [facility];  Service: Gastroenterology;   Laterality: N/A;  9:15am;asa 3   ENDOSCOPIC RETROGRADE CHOLANGIOPANCREATOGRAPHY (ERCP) WITH PROPOFOL N/A 09/20/2023   Procedure: ENDOSCOPIC RETROGRADE CHOLANGIOPANCREATOGRAPHY (ERCP) WITH PROPOFOL;  Surgeon: Lynann Bologna, MD;  Location: [facility] ENDOSCOPY;  Service: Gastroenterology;  Laterality: N/A;   ESOPHAGOGASTRODUODENOSCOPY (EGD) WITH PROPOFOL N/A 01/10/2022   Procedure: ESOPHAGOGASTRODUODENOSCOPY (EGD) WITH PROPOFOL;  Surgeon: Dolores Frame, MD;  [facility]: [facility];  Service: Gastroenterology;  Laterality: N/A;   HAND / FINGER LESION EXCISION     HERNIA REPAIR     IR RADIOLOGIST EVAL & MGMT  04/04/2023   IR RADIOLOGIST EVAL & MGMT  08/09/2023   MASTOIDECTOMY     PILONIDAL CYST EXCISION     POLYPECTOMY  01/10/2022   Procedure: POLYPECTOMY;  Surgeon: Dolores Frame, MD;  [facility]: [facility];  Service: Gastroenterology;;   POLYPECTOMY  02/27/2023   Procedure: POLYPECTOMY;  Surgeon: Dolores Frame, MD;  Location: [facility];  Service: Gastroenterology;;   RADIOLOGY WITH ANESTHESIA N/A 12/27/2022   Procedure: CT microwave ablation of the liver;  Surgeon: Irish Lack, MD;  Location: [facility];  Service: Radiology;  Laterality: N/A;   REMOVAL OF STONES  09/20/2023   Procedure: REMOVAL OF STONES;  Surgeon: Lynann Bologna, MD;  Location: [facility] ENDOSCOPY;  Service: Gastroenterology;;   Dennison Mascot  09/20/2023   Procedure: Dennison Mascot;  Surgeon: Lynann Bologna, MD;  Location: [facility] ENDOSCOPY;  Service: Gastroenterology;;  SPHINCTEROTOMY  09/20/2023   Procedure: SPHINCTEROPLASTY;  Surgeon: Lynann Bologna, MD;  Location: [facility] ENDOSCOPY;  Service: Gastroenterology;;   SUBMUCOSAL TATTOO INJECTION  01/10/2022   Procedure: SUBMUCOSAL TATTOO INJECTION;  Surgeon: Marguerita Merles, Reuel Boom, MD;  Location: [facility];  Service: Gastroenterology;;   SURGERY OF LIP     skin cancer    Allergies: Gadobenate  Medications: Prior to Admission medications   Medication  Sig Start Date End Date Taking? Authorizing Provider  amitriptyline (ELAVIL) 10 MG tablet Take 10 mg by mouth every evening.   Yes [provider]  amLODipine (NORVASC) 5 MG tablet Take 5 mg by mouth 2 (two) times daily.   Yes [provider]  ascorbic acid (VITAMIN C/NATURAL ROSE HIPS) 1000 MG tablet Take 1,000 mg by mouth every evening.   Yes [provider]  atorvastatin (LIPITOR) 40 MG tablet Take 40 mg by mouth every evening.   Yes [provider]  carvedilol (COREG) 6.25 MG tablet Take 6.25 mg by mouth 2 (two) times daily with a meal.   Yes [provider]  Cholecalciferol (VITAMIN D3) 125 MCG (5000 UT) CAPS Take 5,000 Units by mouth every evening.   Yes [provider]  dorzolamide (TRUSOPT) 2 % ophthalmic solution Place 1 drop into the left eye 2 (two) times daily.   Yes [provider]  ELIQUIS 5 MG TABS tablet TAKE 1 TABLET(5 MG) BY MOUTH TWICE DAILY 06/05/23  Yes [name], MD  ferrous sulfate 325 (65 FE) MG tablet Take 325 mg by mouth daily with breakfast.   Yes [provider]  hydrALAZINE (APRESOLINE) 25 MG tablet Take 50 mg by mouth in the morning and at bedtime.   Yes [provider]  KERENDIA 10 MG TABS Take 10 mg by mouth daily.   Yes [provider]  latanoprost (XALATAN) 0.005 % ophthalmic solution Place 1 drop into both eyes at bedtime.   Yes [provider]  linagliptin (TRADJENTA) 5 MG TABS tablet Take 5 mg by mouth in the morning.   Yes [provider]  loratadine (CLARITIN) 10 MG tablet Take 10 mg by mouth every morning.   Yes [provider]  Misc Natural Products (PROSTATE SUPPORT PO) Take 1 capsule by mouth every evening. NOW Prostate Support   Yes [provider]  omeprazole (PRILOSEC) 40 MG capsule Take 1 capsule (40 mg total) by mouth daily. 05/21/23  Yes Carlan, Chelsea L, NP  vitamin B-12 (CYANOCOBALAMIN) 1000 MCG tablet Take 1,000  mcg by mouth every evening. Now (Brand)   Yes [provider]  amoxicillin-clavulanate (AUGMENTIN) 875-125 MG tablet Take 1 tablet by mouth 2 (two) times daily. 09/21/23   Deanna Artis, DO  folic acid (FOLVITE) 1 MG tablet Take 1 tablet (1 mg total) by mouth every evening. 09/21/23   Deanna Artis, DO  HYDROcodone-acetaminophen (NORCO) 5-325 MG tablet Take 1 tablet by mouth every 8 (eight) hours as needed for moderate pain (pain score 4-6). 09/21/23   Deanna Artis, DO  ondansetron (ZOFRAN) 4 MG tablet Take 1 tablet (4 mg total) by mouth every 6 (six) hours as needed for nausea. 09/21/23   Deanna Artis, DO  ondansetron (ZOFRAN-ODT) 4 MG disintegrating tablet 4mg  ODT q4 hours prn nausea/vomit 09/16/23   Bethann Berkshire, MD  tobramycin (TOBREX) 0.3 % ophthalmic solution INSTILL 5 DROPS TO RIGHT EAR TWICE DAILY FOR 7 DAYS    [provider]     History reviewed. No pertinent family  history.  Social History   Socioeconomic History   Marital status: Married    Spouse name: Not on file   Number of children: Not on file   Years of education: Not on file   Highest education level: Not on file  Occupational History   Not on file  Tobacco Use   Smoking status: Former    Current packs/day: 0.00    Average packs/day: 2.0 packs/day for 25.0 years (50.0 ttl pk-yrs)    Types: Cigarettes    Start date: 46    Quit date: 56    Years since quitting: 30.2    Passive exposure: Past   Smokeless tobacco: Never  Vaping Use   Vaping status: Never Used  Substance and Sexual Activity   Alcohol use: Never   Drug use: Never   Sexual activity: Not on file  Other Topics Concern   Not on file  Social History Narrative   Not on file   Social Drivers of Health   Financial Resource Strain: Not on file  Food Insecurity: No Food Insecurity (09/16/2023)   Hunger Vital Sign    Worried About Running Out of Food in the Last Year: Never true    Ran Out of Food in the Last Year:  Never true  Transportation Needs: No Transportation Needs (09/16/2023)   PRAPARE - Administrator, Civil Service (Medical): No    Lack of Transportation (Non-Medical): No  Physical Activity: Not on file  Stress: Not on file  Social Connections: Moderately Integrated (09/16/2023)   Social Connection and Isolation Panel [NHANES]    Frequency of Communication with Friends and Family: More than three times a week    Frequency of Social Gatherings with Friends and Family: Once a week    Attends Religious Services: More than 4 times per year    Active Member of [employer] or Organizations: No    Attends Banker Meetings: Never    Marital Status: Married      Review of Systems  Vital Signs: There were no vitals taken for this visit.  Advance Care Plan: No documents on file    Physical Exam  Imaging: DG ERCP Result Date: 09/20/2023 CLINICAL DATA:  Choledocholithiasis. EXAM: ERCP TECHNIQUE: Multiple spot images obtained with the fluoroscopic device and submitted for interpretation post-procedure. COMPARISON:  MRI/MRCP on 09/18/2023 FINDINGS: Imaging obtained with a C-arm demonstrates cannulation of the common bile duct with cholangiogram demonstrating discrete filling defects within the proximal and mid aspects of a dilated common bile duct. Balloon sweep maneuver was performed to remove calculi. Completion cholangiogram shows no further filling defects. IMPRESSION: Choledocholithiasis confirmed in the common bile duct with balloon sweep maneuver to remove calculi. These images were submitted for radiologic interpretation only. Please see the procedural report for the amount of contrast and the fluoroscopy time utilized. Electronically Signed   By: Irish Lack M.D.   On: 09/20/2023 16:20   MR ABDOMEN MRCP WO CONTRAST Result Date: 09/18/2023 CLINICAL DATA:  Biliary obstruction suspected metastatic colon cancer EXAM: MRI ABDOMEN WITHOUT CONTRAST  (INCLUDING MRCP)  TECHNIQUE: Multiplanar multisequence MR imaging of the abdomen was performed. Heavily T2-weighted images of the biliary and pancreatic ducts were obtained, and three-dimensional MRCP images were rendered by post processing. COMPARISON:  Right upper quadrant ultrasound, 09/17/2023 CT chest angiogram, 09/16/2023, PET-CT, 07/28/2023 FINDINGS: Lower chest: Trace pleural effusions. Hepatobiliary: Metastasis of the inferior right lobe of the liver, hepatic segment VI (series 3, image 23). Sludge in the gallbladder. Mild  gallbladder wall thickening. Adjacent gallstones in the central common bile duct measuring 1.0 and 0.7 cm (series 4, image 15). Common bile duct measures up to 1.1 cm in caliber within the pancreatic head with mild intrahepatic biliary ductal dilatation. Pancreas: Unremarkable. No pancreatic ductal dilatation or surrounding inflammatory changes. Spleen: Normal in size without significant abnormality. Adrenals/Urinary Tract: Adrenal glands are unremarkable. Numerous bilateral renal cortical and parapelvic cysts, some of which are septated, requiring no specific further follow-up or characterization. No calculi or hydronephrosis. Stomach/Bowel: Stomach is within normal limits. No evidence of bowel wall thickening, distention, or inflammatory changes. Vascular/Lymphatic: No significant vascular findings are present. No enlarged abdominal lymph nodes. Other: No abdominal wall hernia.  Mild anasarca.  Trace ascites. Musculoskeletal: No acute or significant osseous findings. IMPRESSION: 1. Choledocholithiasis with adjacent gallstones in the central common bile duct measuring 1.0 and 0.7 cm. Common bile duct measures up to 1.1 cm in caliber within the pancreatic head with mild intrahepatic biliary ductal dilatation. 2. Sludge in the gallbladder. Mild gallbladder wall thickening. 3. Known metastasis of the inferior right lobe of the liver, hepatic segment VI. 4. Trace pleural effusions, trace ascites, and mild  anasarca. These results will be called to the ordering clinician or representative by the Radiologist Assistant, and communication documented in the PACS or [employer]. Electronically Signed   By: Jearld Lesch M.D.   On: 09/18/2023 11:03   MR 3D Recon At Scanner Result Date: 09/18/2023 CLINICAL DATA:  Biliary obstruction suspected metastatic colon cancer EXAM: MRI ABDOMEN WITHOUT CONTRAST  (INCLUDING MRCP) TECHNIQUE: Multiplanar multisequence MR imaging of the abdomen was performed. Heavily T2-weighted images of the biliary and pancreatic ducts were obtained, and three-dimensional MRCP images were rendered by post processing. COMPARISON:  Right upper quadrant ultrasound, 09/17/2023 CT chest angiogram, 09/16/2023, PET-CT, 07/28/2023 FINDINGS: Lower chest: Trace pleural effusions. Hepatobiliary: Metastasis of the inferior right lobe of the liver, hepatic segment VI (series 3, image 23). Sludge in the gallbladder. Mild gallbladder wall thickening. Adjacent gallstones in the central common bile duct measuring 1.0 and 0.7 cm (series 4, image 15). Common bile duct measures up to 1.1 cm in caliber within the pancreatic head with mild intrahepatic biliary ductal dilatation. Pancreas: Unremarkable. No pancreatic ductal dilatation or surrounding inflammatory changes. Spleen: Normal in size without significant abnormality. Adrenals/Urinary Tract: Adrenal glands are unremarkable. Numerous bilateral renal cortical and parapelvic cysts, some of which are septated, requiring no specific further follow-up or characterization. No calculi or hydronephrosis. Stomach/Bowel: Stomach is within normal limits. No evidence of bowel wall thickening, distention, or inflammatory changes. Vascular/Lymphatic: No significant vascular findings are present. No enlarged abdominal lymph nodes. Other: No abdominal wall hernia.  Mild anasarca.  Trace ascites. Musculoskeletal: No acute or significant osseous findings. IMPRESSION: 1.  Choledocholithiasis with adjacent gallstones in the central common bile duct measuring 1.0 and 0.7 cm. Common bile duct measures up to 1.1 cm in caliber within the pancreatic head with mild intrahepatic biliary ductal dilatation. 2. Sludge in the gallbladder. Mild gallbladder wall thickening. 3. Known metastasis of the inferior right lobe of the liver, hepatic segment VI. 4. Trace pleural effusions, trace ascites, and mild anasarca. These results will be called to the ordering clinician or representative by the Radiologist Assistant, and communication documented in the PACS or [employer]. Electronically Signed   By: Jearld Lesch M.D.   On: 09/18/2023 11:03   US Abdomen Limited RUQ (LIVER/GB) Result Date: 09/17/2023 CLINICAL DATA:  Elevated LFTs EXAM: ULTRASOUND ABDOMEN LIMITED RIGHT UPPER QUADRANT  COMPARISON:  CT C [facility] 09/16/2023 FINDINGS: Gallbladder: Sludge in the gallbladder lumen. No gallbladder wall thickening or pericholecystic fluid. Negative sonographic Murphy's sign. Common bile duct: Diameter: 11 mm, dilated. Liver: Known liver lesion not well assessed on current exam. Increased parenchymal echogenicity. Portal vein is patent on color Doppler imaging with normal direction of blood flow towards the liver. Other: None. IMPRESSION: 1. Sludge in the gallbladder lumen. No secondary signs to suggest acute cholecystitis. 2. Dilated common bile duct. Recommend correlation with LFTs. If there is concern for biliary obstruction, recommend MRI/MRCP. 3. Known liver lesion not well assessed on current exam. 4. Increased hepatic parenchymal echogenicity suggestive of steatosis. Electronically Signed   By: Annia Belt M.D.   On: 09/17/2023 12:56   CT Angio Chest/Abd/Pel for Dissection W and/or Wo Contrast Result Date: 09/16/2023 CLINICAL DATA:  Acute aortic syndrome (AAS) suspected. Mild chest tightening. Shortness of breath. History of metastatic colon cancer. Liver ablation. * Tracking Code: BO * EXAM: CT  ANGIOGRAPHY CHEST, ABDOMEN AND PELVIS TECHNIQUE: Non-contrast CT of the chest was initially obtained. Multidetector CT imaging through the chest, abdomen and pelvis was performed using the standard protocol during bolus administration of intravenous contrast. Multiplanar reconstructed images and MIPs were obtained and reviewed to evaluate the vascular anatomy. RADIATION DOSE REDUCTION: This exam was performed according to the departmental dose-optimization program which includes automated exposure control, adjustment of the mA and/or kV according to patient size and/or use of iterative reconstruction technique. CONTRAST:  OMNIPAQUE IOHEXOL 350 MG/ML SOLN COMPARISON:  CT scan chest from 07/02/2023 and PET-CT scan from 07/19/2023. FINDINGS: CTA CHEST FINDINGS Cardiovascular: No intramural hematoma noted in the thoracic aorta on the unenhanced images. Thoracic aorta is normal in caliber without aneurysm, dissection, vasculitis or significant stenosis. There is satisfactory opacification of bilateral pulmonary artery branches up to the segmental level. No filling defects noted to suggest embolism to the segmental artery level. Normal cardiac size. No pericardial effusion. No aortic aneurysm. There are coronary artery calcifications, in keeping with coronary artery disease. There are also moderate to severe peripheral atherosclerotic vascular calcifications of thoracic aorta and its major branches. Mediastinum/Nodes: Visualized thyroid gland appears grossly unremarkable. No solid / cystic mediastinal masses. The esophagus is nondistended precluding optimal assessment. No axillary, mediastinal or hilar lymphadenopathy by size criteria. Lungs/Pleura: The central tracheo-bronchial tree is patent. There are 3, solid noncalcified nodules in the bilateral lungs (series 100, images 84, 120 and 135), which have increased in size in the prior study and compatible with metastases. The largest nodule is in the right lung  lower lobe and measures 8.5 x 9.3 mm. There is an additional sub-4 mm noncalcified nodule in the right lung lower lobe (series 100, image 119), which is unchanged since several prior studies and favored benign in etiology. No mass or consolidation. No pleural effusion or pneumothorax. Musculoskeletal: The visualized soft tissues of the chest wall are grossly unremarkable. No suspicious osseous lesions. There are mild multilevel degenerative changes in the visualized spine. Review of the MIP images confirms the above findings. CTA ABDOMEN AND PELVIS FINDINGS VASCULAR Aorta: Normal caliber aorta without aneurysm, dissection, vasculitis or significant stenosis. Celiac: Patent without evidence of aneurysm, dissection, vasculitis or significant stenosis. SMA: Patent without evidence of aneurysm, dissection, vasculitis or significant stenosis. Renals: Both renal arteries are patent without evidence of aneurysm, dissection, vasculitis, fibromuscular dysplasia or significant stenosis. IMA: Patent without evidence of aneurysm, dissection, vasculitis or significant stenosis. Inflow: Patent without evidence of aneurysm, dissection, vasculitis or significant  stenosis. Veins: No obvious venous abnormality within the limitations of this arterial phase study. Review of the MIP images confirms the above findings. NON-VASCULAR Hepatobiliary: The liver is normal in size. Non-cirrhotic configuration. Redemonstration of heterogeneous hypoattenuating mass in the right hepatic lobe, segment 6 measuring 4.3 x 5.8 cm, compatible with metastasis. There is interval increase in the size of the lesion when compared to the prior PET-CT scan from 07/19/2023. No new suspicious liver lesions seen. No intrahepatic or extrahepatic bile duct dilation. Gallbladder is distended. There is mild diffuse wall thickening. No calcified gallstones noted. However, there is subtle pericholecystic fat stranding. Findings are equivocal for acute cholecystitis on  the basis of CT scan. Correlate clinically and with liver function tests to determine the need for additional imaging. Pancreas: Unremarkable. No pancreatic ductal dilatation or surrounding inflammatory changes. Spleen: Within normal limits. No focal lesion. Adrenals/Urinary Tract: Adrenal glands are unremarkable. No suspicious renal mass. Multiple bilateral simple renal cysts noted with largest arising from the right kidney measuring 4.2 x 5.8 cm. No nephroureterolithiasis or obstructive uropathy. Unremarkable urinary bladder. Stomach/Bowel: There is a small sliding hiatal hernia. Patient is status post right hemicolectomy with ileocolonic anastomosis in the right upper quadrant. No disproportionate dilation of the small or large bowel loops. No evidence of abnormal bowel wall thickening or inflammatory changes. There are multiple diverticula throughout the colon, without imaging signs of diverticulitis. Vascular/Lymphatic: No ascites or pneumoperitoneum. No abdominal or pelvic lymphadenopathy, by size criteria. No aneurysmal dilation of the major abdominal arteries. There are moderate peripheral atherosclerotic vascular calcifications of the aorta and its major branches. Reproductive: Enlarged prostate. Symmetric seminal vesicles. Other: There is a small right paramedian supraumbilical ventral hernia containing fat and portion of unobstructed transverse colon. There is also a small fat containing left inguinal hernia. The soft tissues and abdominal wall are otherwise unremarkable. Musculoskeletal: No suspicious osseous lesions. There are mild - moderate multilevel degenerative changes in the visualized spine. Mild compression deformity of L3 vertebral body, similar to the prior study. Review of the MIP images confirms the above findings. IMPRESSION: 1. No acute aortic syndrome. No thoracic aortic acute intramural hematoma. No thoracoabdominal aortic aneurysm, dissection or penetrating atherosclerotic ulcer. There  are moderate to severe peripheral atherosclerotic vascular calcifications of the major arteries of the chest, abdomen and pelvis. 2. There are 3 enlarging, subcentimeter, solid pulmonary nodules, suggesting worsening metastases. 3. Mild interval increase in the patient's known ablated right hepatic lobe liver lesion as well. No new liver lesion seen. 4. No other metastatic disease identified within the chest, abdomen or pelvis. 5. Multiple other nonacute observations, as described above. Aortic Atherosclerosis (ICD10-I70.0). Electronically Signed   By: Jules Schick M.D.   On: 09/16/2023 14:53   DG Chest Portable 1 View Result Date: 09/16/2023 CLINICAL DATA:  89 year old male with history of chest tightness. EXAM: PORTABLE CHEST 1 VIEW COMPARISON:  Chest x-ray 02/07/2023. FINDINGS: Chronic elevation of the right hemidiaphragm again noted. No acute consolidative airspace disease. No pleural effusions. No pneumothorax. No evidence of pulmonary edema. Heart size is upper limits of normal. Upper mediastinal contours are within normal limits. Atherosclerotic calcifications are noted in the thoracic aorta. IMPRESSION: 1. No radiographic evidence of acute cardiopulmonary disease. 2. Aortic atherosclerosis. Electronically Signed   By: Trudie Reed M.D.   On: 09/16/2023 13:00    Labs:  CBC: Recent Labs    09/19/23 0624 09/20/23 0627 09/21/23 0624 10/08/23 0905  WBC 4.5 5.7 6.7 7.5  HGB 9.6* 10.8* 10.9* 11.4*  HCT 28.1* 31.5* 31.1* 34.2*  PLT 101* 128* 159 180    COAGS: Recent Labs    12/27/22 0830 02/07/23 1352 09/07/23 1322 09/17/23 0428 09/19/23 1819 09/21/23 0624  INR 1.1 1.8* 1.2 1.6*  --   --   APTT  --   --  32  --  77* 120*    BMP: Recent Labs    09/19/23 0624 09/20/23 0627 09/21/23 0624 10/08/23 0905  NA 136 138 137 138  K 3.6 3.9 4.0 4.1  CL 110 109 104 106  CO2 19* 21* 22 23  GLUCOSE 103* 105* 128* 147*  BUN 23 15 18  27*  CALCIUM 8.5* 9.0 8.7* 9.1  CREATININE  1.86* 1.34* 1.28* 1.30*  GFRNONAA 35* 52* 55* 53*    LIVER FUNCTION TESTS: Recent Labs    09/19/23 0624 09/20/23 0627 09/21/23 0624 10/08/23 0905  BILITOT 2.4* 2.2* 1.4* 1.9*  AST 95* 92* 61* 46*  ALT 141* 126* 71* 50*  ALKPHOS 195* 262* 214* 169*  PROT 5.1* 5.4* 5.5* 6.8  ALBUMIN 2.5* 2.5* 2.6* 3.4*    TUMOR MARKERS: No results for input(s): "AFPTM", "CEA", "CA199", "CHROMGRNA" in the last 8760 hours.  Assessment and Plan:  Herbert Wood is a 89 y.o. 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 He underwent a thermal ablation in May 2024 for R liver metastasis. GI placed request for another  image guided thermal ablation of liver mass  -09/18/23 MR Abd: Known metastasis of the inferior right lobe of the liver, hepatic segment VI. -procedure to be performed under general anesthesia -pt NPO MN prior to procedure -driver present, 24 hr supervision -takes eliquis, asked to hold since 3/9 -3/10 labs: Hgb 11.4 , creat 1.3 (appears baseline 1.28 on 09/21/23, 1.37 06/26/24), plt 180  Thank you for allowing our service to participate in Grayson Pfefferle 's care.    Electronically Signed: Carlton Adam, NP   10/09/2023, 1:19 PM     I spent a total of {New INPT:304952001} {New Out-Pt:304952002}  {Established Out-Pt:304952003} in face to face in clinical consultation, greater than 50% of which was counseling/coordinating care for ***   (A copy of this note was sent to the referring provider and the time of visit.)

## 2023-10-10 ENCOUNTER — Other Ambulatory Visit: Payer: Self-pay

## 2023-10-10 ENCOUNTER — Observation Stay (HOSPITAL_COMMUNITY)
Admission: RE | Admit: 2023-10-10 | Discharge: 2023-10-10 | Disposition: A | Discharge status: Home / Self Care | Payer: Medicare Other | Source: Ambulatory Visit | Attending: Interventional Radiology | Admitting: Interventional Radiology

## 2023-10-10 ENCOUNTER — Ambulatory Visit (HOSPITAL_BASED_OUTPATIENT_CLINIC_OR_DEPARTMENT_OTHER): Payer: Self-pay | Admitting: Anesthesiology

## 2023-10-10 ENCOUNTER — Observation Stay (HOSPITAL_COMMUNITY)
Admission: RE | Admit: 2023-10-10 | Discharge: 2023-10-11 | Disposition: A | Discharge status: Home / Self Care | Payer: Medicare Other | Source: Ambulatory Visit | Attending: Interventional Radiology | Admitting: Interventional Radiology

## 2023-10-10 ENCOUNTER — Ambulatory Visit (HOSPITAL_COMMUNITY): Payer: Self-pay | Admitting: Physician Assistant

## 2023-10-10 ENCOUNTER — Encounter (HOSPITAL_COMMUNITY): Payer: Self-pay | Admitting: Interventional Radiology

## 2023-10-10 ENCOUNTER — Encounter (HOSPITAL_COMMUNITY)
Admission: RE | Disposition: A | Discharge status: Home / Self Care | Payer: Self-pay | Source: Ambulatory Visit | Attending: Interventional Radiology

## 2023-10-10 DIAGNOSIS — C787 Secondary malignant neoplasm of liver and intrahepatic bile duct: Secondary | ICD-10-CM | POA: Diagnosis not present

## 2023-10-10 DIAGNOSIS — Z87891 Personal history of nicotine dependence: Secondary | ICD-10-CM | POA: Diagnosis not present

## 2023-10-10 DIAGNOSIS — Z7901 Long term (current) use of anticoagulants: Secondary | ICD-10-CM | POA: Diagnosis not present

## 2023-10-10 DIAGNOSIS — Z79899 Other long term (current) drug therapy: Secondary | ICD-10-CM | POA: Diagnosis not present

## 2023-10-10 DIAGNOSIS — E1122 Type 2 diabetes mellitus with diabetic chronic kidney disease: Secondary | ICD-10-CM | POA: Insufficient documentation

## 2023-10-10 DIAGNOSIS — I10 Essential (primary) hypertension: Secondary | ICD-10-CM | POA: Diagnosis not present

## 2023-10-10 DIAGNOSIS — Z86718 Personal history of other venous thrombosis and embolism: Secondary | ICD-10-CM | POA: Diagnosis not present

## 2023-10-10 DIAGNOSIS — C189 Malignant neoplasm of colon, unspecified: Principal | ICD-10-CM | POA: Diagnosis present

## 2023-10-10 DIAGNOSIS — I129 Hypertensive chronic kidney disease with stage 1 through stage 4 chronic kidney disease, or unspecified chronic kidney disease: Secondary | ICD-10-CM | POA: Insufficient documentation

## 2023-10-10 DIAGNOSIS — N183 Chronic kidney disease, stage 3 unspecified: Secondary | ICD-10-CM | POA: Diagnosis not present

## 2023-10-10 HISTORY — DX: Gastro-esophageal reflux disease without esophagitis: K21.9

## 2023-10-10 HISTORY — PX: RADIOLOGY WITH ANESTHESIA: SHX6223

## 2023-10-10 LAB — COMPREHENSIVE METABOLIC PANEL
ALT: 42 U/L (ref 0–44)
AST: 37 U/L (ref 15–41)
Albumin: 3.3 g/dL — ABNORMAL LOW (ref 3.5–5.0)
Alkaline Phosphatase: 167 U/L — ABNORMAL HIGH (ref 38–126)
Anion gap: 10 (ref 5–15)
BUN: 28 mg/dL — ABNORMAL HIGH (ref 8–23)
CO2: 21 mmol/L — ABNORMAL LOW (ref 22–32)
Calcium: 9.1 mg/dL (ref 8.9–10.3)
Chloride: 108 mmol/L (ref 98–111)
Creatinine, Ser: 1.49 mg/dL — ABNORMAL HIGH (ref 0.61–1.24)
GFR, Estimated: 45 mL/min — ABNORMAL LOW (ref >60)
Glucose, Bld: 139 mg/dL — ABNORMAL HIGH (ref 70–99)
Potassium: 3.8 mmol/L (ref 3.5–5.1)
Sodium: 139 mmol/L (ref 135–145)
Total Bilirubin: 2 mg/dL — ABNORMAL HIGH (ref 0.0–1.2)
Total Protein: 6.5 g/dL (ref 6.5–8.1)

## 2023-10-10 LAB — TYPE AND SCREEN
ABO/RH(D): O POS
Antibody Screen: NEGATIVE

## 2023-10-10 LAB — GLUCOSE, CAPILLARY
Glucose-Capillary: 129 mg/dL — ABNORMAL HIGH (ref 70–99)
Glucose-Capillary: 147 mg/dL — ABNORMAL HIGH (ref 70–99)

## 2023-10-10 LAB — PROTIME-INR
INR: 1.1 (ref 0.8–1.2)
Prothrombin Time: 14.1 s (ref 11.4–15.2)

## 2023-10-10 SURGERY — CT WITH ANESTHESIA
Anesthesia: General

## 2023-10-10 MED ORDER — ATORVASTATIN CALCIUM 20 MG PO TABS
40.0000 mg | ORAL_TABLET | Freq: Every evening | ORAL | Status: DC
  Administered 2023-10-10: 40 mg via ORAL
  Filled 2023-10-10: qty 2

## 2023-10-10 MED ORDER — SUGAMMADEX SODIUM 200 MG/2ML IV SOLN
INTRAVENOUS | Status: DC | PRN
  Administered 2023-10-10: 200 mg via INTRAVENOUS

## 2023-10-10 MED ORDER — ONDANSETRON HCL 4 MG/2ML IJ SOLN
INTRAMUSCULAR | Status: DC | PRN
  Administered 2023-10-10: 4 mg via INTRAVENOUS

## 2023-10-10 MED ORDER — HYDRALAZINE HCL 50 MG PO TABS
50.0000 mg | ORAL_TABLET | Freq: Two times a day (BID) | ORAL | Status: DC
  Administered 2023-10-10 – 2023-10-11 (×2): 50 mg via ORAL
  Filled 2023-10-10 (×2): qty 1

## 2023-10-10 MED ORDER — DORZOLAMIDE HCL 2 % OP SOLN
1.0000 [drp] | Freq: Two times a day (BID) | OPHTHALMIC | Status: DC
  Administered 2023-10-10 – 2023-10-11 (×2): 1 [drp] via OPHTHALMIC
  Filled 2023-10-10: qty 10

## 2023-10-10 MED ORDER — FENTANYL CITRATE PF 50 MCG/ML IJ SOSY
PREFILLED_SYRINGE | INTRAMUSCULAR | Status: AC
Start: 2023-10-10 — End: 2023-10-10
  Administered 2023-10-10: 25 ug via INTRAVENOUS
  Filled 2023-10-10: qty 2

## 2023-10-10 MED ORDER — LATANOPROST 0.005 % OP SOLN
1.0000 [drp] | Freq: Every day | OPHTHALMIC | Status: DC
  Administered 2023-10-10: 1 [drp] via OPHTHALMIC
  Filled 2023-10-10: qty 2.5

## 2023-10-10 MED ORDER — LINAGLIPTIN 5 MG PO TABS
5.0000 mg | ORAL_TABLET | Freq: Every morning | ORAL | Status: DC
  Administered 2023-10-11: 5 mg via ORAL
  Filled 2023-10-10: qty 1

## 2023-10-10 MED ORDER — SODIUM CHLORIDE 0.9 % IV SOLN
INTRAVENOUS | Status: AC
  Filled 2023-10-10: qty 250

## 2023-10-10 MED ORDER — FENTANYL CITRATE (PF) 100 MCG/2ML IJ SOLN
INTRAMUSCULAR | Status: DC | PRN
  Administered 2023-10-10 (×2): 50 ug via INTRAVENOUS
  Administered 2023-10-10: 25 ug via INTRAVENOUS

## 2023-10-10 MED ORDER — DEXTROSE 5 % IV SOLN
INTRAVENOUS | Status: DC | PRN
  Administered 2023-10-10: 2 g via INTRAVENOUS

## 2023-10-10 MED ORDER — AMLODIPINE BESYLATE 5 MG PO TABS
5.0000 mg | ORAL_TABLET | Freq: Two times a day (BID) | ORAL | Status: DC
  Administered 2023-10-10 – 2023-10-11 (×2): 5 mg via ORAL
  Filled 2023-10-10 (×2): qty 1

## 2023-10-10 MED ORDER — CEFAZOLIN SODIUM-DEXTROSE 2-4 GM/100ML-% IV SOLN
INTRAVENOUS | Status: AC
  Filled 2023-10-10: qty 100

## 2023-10-10 MED ORDER — SODIUM CHLORIDE 0.9 % IV SOLN: INTRAVENOUS | Status: DC | PRN

## 2023-10-10 MED ORDER — LACTATED RINGERS IV SOLN
INTRAVENOUS | Status: DC | PRN
Start: 2023-10-10 — End: 2023-10-10

## 2023-10-10 MED ORDER — ONDANSETRON HCL 4 MG/2ML IJ SOLN: 4.0000 mg | Freq: Once | INTRAMUSCULAR | Status: DC

## 2023-10-10 MED ORDER — ORAL CARE MOUTH RINSE: 15.0000 mL | Freq: Once | OROMUCOSAL | Status: AC

## 2023-10-10 MED ORDER — FENTANYL CITRATE (PF) 250 MCG/5ML IJ SOLN
INTRAMUSCULAR | Status: AC
  Filled 2023-10-10: qty 5

## 2023-10-10 MED ORDER — EPHEDRINE SULFATE-NACL 50-0.9 MG/10ML-% IV SOSY
PREFILLED_SYRINGE | INTRAVENOUS | Status: DC | PRN
  Administered 2023-10-10 (×3): 10 mg via INTRAVENOUS

## 2023-10-10 MED ORDER — EPHEDRINE 5 MG/ML INJ
INTRAVENOUS | Status: AC
  Filled 2023-10-10: qty 5

## 2023-10-10 MED ORDER — LORATADINE 10 MG PO TABS
10.0000 mg | ORAL_TABLET | Freq: Every morning | ORAL | Status: DC
Start: 2023-10-11 — End: 2023-10-11
  Administered 2023-10-11: 10 mg via ORAL
  Filled 2023-10-10: qty 1

## 2023-10-10 MED ORDER — LACTATED RINGERS IV SOLN: INTRAVENOUS | Status: DC

## 2023-10-10 MED ORDER — LIDOCAINE 2% (20 MG/ML) 5 ML SYRINGE
INTRAMUSCULAR | Status: DC | PRN
  Administered 2023-10-10: 100 mg via INTRAVENOUS

## 2023-10-10 MED ORDER — PHENYLEPHRINE 80 MCG/ML (10ML) SYRINGE FOR IV PUSH (FOR BLOOD PRESSURE SUPPORT)
PREFILLED_SYRINGE | INTRAVENOUS | Status: DC | PRN
  Administered 2023-10-10: 160 ug via INTRAVENOUS
  Administered 2023-10-10: 80 ug via INTRAVENOUS

## 2023-10-10 MED ORDER — FENTANYL CITRATE PF 50 MCG/ML IJ SOSY
25.0000 ug | PREFILLED_SYRINGE | INTRAMUSCULAR | Status: AC | PRN
  Administered 2023-10-10 (×4): 25 ug via INTRAVENOUS

## 2023-10-10 MED ORDER — SENNOSIDES-DOCUSATE SODIUM 8.6-50 MG PO TABS: 1.0000 | ORAL_TABLET | Freq: Every day | ORAL | Status: DC | PRN

## 2023-10-10 MED ORDER — CHLORHEXIDINE GLUCONATE CLOTH 2 % EX PADS: 6.0000 | MEDICATED_PAD | Freq: Every day | CUTANEOUS | Status: DC

## 2023-10-10 MED ORDER — AMITRIPTYLINE HCL 10 MG PO TABS
10.0000 mg | ORAL_TABLET | Freq: Every evening | ORAL | Status: DC
  Administered 2023-10-10: 10 mg via ORAL
  Filled 2023-10-10: qty 1

## 2023-10-10 MED ORDER — FERROUS SULFATE 325 (65 FE) MG PO TABS
325.0000 mg | ORAL_TABLET | Freq: Every day | ORAL | Status: DC
  Administered 2023-10-11: 325 mg via ORAL
  Filled 2023-10-10: qty 1

## 2023-10-10 MED ORDER — CHLORHEXIDINE GLUCONATE 0.12 % MT SOLN
15.0000 mL | Freq: Once | OROMUCOSAL | Status: AC
  Administered 2023-10-10: 15 mL via OROMUCOSAL
  Filled 2023-10-10: qty 15

## 2023-10-10 MED ORDER — SODIUM CHLORIDE 0.9 % IV SOLN: INTRAVENOUS | Status: DC

## 2023-10-10 MED ORDER — VASOPRESSIN 20 UNIT/ML IV SOLN
INTRAVENOUS | Status: AC
  Filled 2023-10-10: qty 1

## 2023-10-10 MED ORDER — FINERENONE 10 MG PO TABS: 10.0000 mg | ORAL_TABLET | Freq: Every day | ORAL | Status: DC

## 2023-10-10 MED ORDER — GLYCOPYRROLATE PF 0.2 MG/ML IJ SOSY
PREFILLED_SYRINGE | INTRAMUSCULAR | Status: DC | PRN
  Administered 2023-10-10: .2 mg via INTRAVENOUS

## 2023-10-10 MED ORDER — PROPOFOL 10 MG/ML IV BOLUS
INTRAVENOUS | Status: DC | PRN
Start: 2023-10-10 — End: 2023-10-10
  Administered 2023-10-10: 50 mg via INTRAVENOUS
  Administered 2023-10-10: 100 mg via INTRAVENOUS
  Administered 2023-10-10: 50 mg via INTRAVENOUS

## 2023-10-10 MED ORDER — ONDANSETRON HCL 4 MG/2ML IJ SOLN: 4.0000 mg | Freq: Four times a day (QID) | INTRAMUSCULAR | Status: DC | PRN

## 2023-10-10 MED ORDER — PANTOPRAZOLE SODIUM 40 MG PO TBEC
40.0000 mg | DELAYED_RELEASE_TABLET | Freq: Every day | ORAL | Status: DC
  Administered 2023-10-11: 40 mg via ORAL
  Filled 2023-10-10: qty 1

## 2023-10-10 MED ORDER — HYDROCODONE-ACETAMINOPHEN 5-325 MG PO TABS
1.0000 | ORAL_TABLET | ORAL | Status: DC | PRN
  Administered 2023-10-10: 2 via ORAL
  Filled 2023-10-10 (×2): qty 2

## 2023-10-10 MED ORDER — CEFOXITIN SODIUM 2 G IV SOLR
2.0000 g | Freq: Once | INTRAVENOUS | Status: DC
  Filled 2023-10-10: qty 2

## 2023-10-10 MED ORDER — ROCURONIUM BROMIDE 100 MG/10ML IV SOLN
INTRAVENOUS | Status: DC | PRN
  Administered 2023-10-10: 20 mg via INTRAVENOUS
  Administered 2023-10-10: 50 mg via INTRAVENOUS
  Administered 2023-10-10: 10 mg via INTRAVENOUS
  Administered 2023-10-10: 20 mg via INTRAVENOUS

## 2023-10-10 MED ORDER — FOLIC ACID 1 MG PO TABS
1.0000 mg | ORAL_TABLET | Freq: Every evening | ORAL | Status: DC
Start: 2023-10-10 — End: 2023-10-11
  Administered 2023-10-10: 1 mg via ORAL
  Filled 2023-10-10: qty 1

## 2023-10-10 MED ORDER — CARVEDILOL 6.25 MG PO TABS
6.2500 mg | ORAL_TABLET | Freq: Two times a day (BID) | ORAL | Status: DC
  Administered 2023-10-10 – 2023-10-11 (×2): 6.25 mg via ORAL
  Filled 2023-10-10 (×2): qty 1

## 2023-10-10 MED ORDER — FENTANYL CITRATE PF 50 MCG/ML IJ SOSY
PREFILLED_SYRINGE | INTRAMUSCULAR | Status: AC
  Administered 2023-10-10: 25 ug via INTRAVENOUS
  Filled 2023-10-10: qty 1

## 2023-10-10 MED ORDER — DOCUSATE SODIUM 100 MG PO CAPS
100.0000 mg | ORAL_CAPSULE | Freq: Two times a day (BID) | ORAL | Status: DC
  Administered 2023-10-10 (×2): 100 mg via ORAL
  Filled 2023-10-10 (×2): qty 1

## 2023-10-10 MED ORDER — ONDANSETRON HCL 4 MG PO TABS
4.0000 mg | ORAL_TABLET | Freq: Four times a day (QID) | ORAL | Status: DC | PRN
  Administered 2023-10-11: 4 mg via ORAL
  Filled 2023-10-10: qty 1

## 2023-10-10 NOTE — Plan of Care (Signed)
   Problem: Education: Goal: Knowledge of General Education information will improve Description: Including pain rating scale, medication(s)/side effects and non-pharmacologic comfort measures Outcome: Progressing   Problem: Coping: Goal: Level of anxiety will decrease Outcome: Progressing

## 2023-10-10 NOTE — Procedures (Signed)
 Interventional Radiology Procedure Note  Procedure: CT and US guided microwave thermal ablation of liver metastasis  Anesthesia: General  Complications: None  Estimated Blood Loss: < 10 mL  Findings: Right inferior hepatic microwave thermal ablation via 3 NeuWave XT probes. Hydrodissection at tip of liver and in GB fossa prior to ablation.  Plan: PACU recovery followed by overnight observation.  Jodi Marble. [name], M.D Pager:  [phone]

## 2023-10-10 NOTE — Anesthesia Postprocedure Evaluation (Signed)
 Anesthesia Post Note  Patient: Herbert Wood  Procedure(s) Performed: CT WITH ANESTHESIA MICROWAVE ABLATION OF LIVER     Patient location during evaluation: PACU Anesthesia Type: General Level of consciousness: awake and alert Pain management: pain level controlled Vital Signs Assessment: post-procedure vital signs reviewed and stable Respiratory status: spontaneous breathing, nonlabored ventilation, respiratory function stable and patient connected to nasal cannula oxygen Cardiovascular status: blood pressure returned to baseline and stable Postop Assessment: no apparent nausea or vomiting Anesthetic complications: no  No notable events documented.  Last Vitals:  Vitals:   10/10/23 1245 10/10/23 1300  BP: (!) 157/62 (!) 153/53  Pulse: (!) 50 (!) 49  Resp: 18 14  Temp:    SpO2: 98% 99%    Last Pain:  Vitals:   10/10/23 1300  TempSrc:   PainSc: 4                  Nathasha Fiorillo,W. EDMOND

## 2023-10-10 NOTE — Transfer of Care (Signed)
 Immediate Anesthesia Transfer of Care Note  Patient: Herbert Wood  Procedure(s) Performed: CT WITH ANESTHESIA MICROWAVE ABLATION OF LIVER  Patient Location: PACU  Anesthesia Type:General  Level of Consciousness: awake, alert , and oriented  Airway & Oxygen Therapy: Patient Spontanous Breathing and Patient connected to face mask oxygen  Post-op Assessment: Report given to RN  Post vital signs: stable  Last Vitals:  Vitals Value Taken Time  BP 147/53 10/10/23 1215  Temp    Pulse 48 10/10/23 1225  Resp 13 10/10/23 1225  SpO2 92 % 10/10/23 1225  Vitals shown include unfiled device data.  Last Pain:  Vitals:   10/10/23 1205  TempSrc:   PainSc: 0-No pain         Complications: No notable events documented.

## 2023-10-10 NOTE — Progress Notes (Addendum)
 Patient ID: Herbert Wood, male   DOB: [birth year], 89 y.o.   MRN: [id number] Pt s/p MWA liver met earlier today, resting quietly in bed; denies fever, resp issues, worsening abd pain,N/V; wife in room; VSS; afebrile; puncture site RUQ abd clean and dry, mildly tender, no active visible bleeding noted; for overnight obs, dc foley this evening, check am labs

## 2023-10-10 NOTE — Anesthesia Procedure Notes (Signed)
 Procedure Name: Intubation Date/Time: 10/10/2023 9:20 AM  Performed by: Micki Riley, CRNAPre-anesthesia Checklist: Patient identified, Emergency Drugs available, Suction available and Patient being monitored Patient Re-evaluated:Patient Re-evaluated prior to induction Oxygen Delivery Method: Circle System Utilized Preoxygenation: Pre-oxygenation with 100% oxygen Induction Type: IV induction Ventilation: Mask ventilation without difficulty Laryngoscope Size: Miller and 2 Grade View: Grade II Tube type: Oral Tube size: 7.5 mm Number of attempts: 1 Airway Equipment and Method: Stylet and Oral airway Placement Confirmation: ETT inserted through vocal cords under direct vision, positive ETCO2 and breath sounds checked- equal and bilateral Secured at: 23 cm Tube secured with: Tape Dental Injury: Teeth and Oropharynx as per pre-operative assessment

## 2023-10-11 ENCOUNTER — Encounter (HOSPITAL_COMMUNITY): Payer: Self-pay | Admitting: Interventional Radiology

## 2023-10-11 DIAGNOSIS — C189 Malignant neoplasm of colon, unspecified: Secondary | ICD-10-CM | POA: Diagnosis not present

## 2023-10-11 LAB — CBC
HCT: 32.5 % — ABNORMAL LOW (ref 39.0–52.0)
Hemoglobin: 10.6 g/dL — ABNORMAL LOW (ref 13.0–17.0)
MCH: 33.2 pg (ref 26.0–34.0)
MCHC: 32.6 g/dL (ref 30.0–36.0)
MCV: 101.9 fL — ABNORMAL HIGH (ref 80.0–100.0)
Platelets: 151 10*3/uL (ref 150–400)
RBC: 3.19 MIL/uL — ABNORMAL LOW (ref 4.22–5.81)
RDW: 13.2 % (ref 11.5–15.5)
WBC: 7.8 10*3/uL (ref 4.0–10.5)
nRBC: 0 % (ref 0.0–0.2)

## 2023-10-11 NOTE — Care Management Obs Status (Signed)
 MEDICARE OBSERVATION STATUS NOTIFICATION   Patient Details  Name: Herbert Wood MRN: [id number] Date of Birth: [birth year]   Medicare Observation Status Notification Given:  Yes    Howell Rucks, RN 10/11/2023, 9:56 AM

## 2023-10-11 NOTE — Discharge Summary (Signed)
 Patient ID: Herbert Wood MRN: [id number] DOB/AGE: 08/15/1934 89 y.o.  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 ,S/p image guided thermal ablation of liver mass on 10/10/23 Principal Problem:   Colon carcinoma metastatic to liver [facility])  Past Medical History:  Diagnosis Date   Adenomatous colon polyp    Anemia    Carotid bruit    Chronic dyspnea    Diabetes (HCC)    type 2   Diverticular disease    DVT (deep venous thrombosis) (HCC)    lower limb   Dyspnea    Factor V deficiency (HCC)    GERD (gastroesophageal reflux disease)    Hearing loss    bilateral   Hyperlipemia    Hypertension    Inguinal hernia    Kidney disease    stage 3   Lung nodule    Lymphadenopathy    Malignant neoplasm (HCC)    salivary gland   Murmur    Osteoarthritis    Senile purpura (HCC)    Past Surgical History:  Procedure Laterality Date   BIOPSY  01/10/2022   Procedure: BIOPSY;  Surgeon: Dolores Frame, MD;  [facility];  Service: Gastroenterology;;   COLON SURGERY     COLONOSCOPY WITH PROPOFOL N/A 01/10/2022   Procedure: COLONOSCOPY WITH PROPOFOL;  Surgeon: Dolores Frame, MD;  [facility];  Service: Gastroenterology;  Laterality: N/A;  per Soledad Gerlach, pt to arrive at 9:15   COLONOSCOPY WITH PROPOFOL N/A 02/27/2023   Procedure: COLONOSCOPY WITH PROPOFOL;  Surgeon: Dolores Frame, MD;  [facility];  Service: Gastroenterology;  Laterality: N/A;  9:15am;asa 3   ENDOSCOPIC RETROGRADE CHOLANGIOPANCREATOGRAPHY (ERCP) WITH PROPOFOL N/A 09/20/2023   Procedure: ENDOSCOPIC RETROGRADE CHOLANGIOPANCREATOGRAPHY (ERCP) WITH PROPOFOL;  Surgeon: Lynann Bologna, MD;  Location[facility];  Service: Gastroenterology;  Laterality: N/A;    ESOPHAGOGASTRODUODENOSCOPY (EGD) WITH PROPOFOL N/A 01/10/2022   Procedure: ESOPHAGOGASTRODUODENOSCOPY (EGD) WITH PROPOFOL;  Surgeon: Dolores Frame, MD;  [facility];  Service: Gastroenterology;  Laterality: N/A;   HAND / FINGER LESION EXCISION     HERNIA REPAIR     IR RADIOLOGIST EVAL & MGMT  04/04/2023   IR RADIOLOGIST EVAL & MGMT  08/09/2023   MASTOIDECTOMY     PILONIDAL CYST EXCISION     POLYPECTOMY  01/10/2022   Procedure: POLYPECTOMY;  Surgeon: Dolores Frame, MD;  [facility];  Service: Gastroenterology;;   POLYPECTOMY  02/27/2023   Procedure: POLYPECTOMY;  Surgeon: Dolores Frame, MD;  [facility];  Service: Gastroenterology;;   RADIOLOGY WITH ANESTHESIA N/A 12/27/2022   Procedure: CT microwave ablation of the liver;  Surgeon: Irish Lack, MD;  Location: [facility];  Service: Radiology;  Laterality: N/A;   RADIOLOGY WITH ANESTHESIA N/A 10/10/2023   Procedure: CT WITH ANESTHESIA MICROWAVE ABLATION OF LIVER;  Surgeon: Irish Lack, MD;  Location: [facility];  Service: Radiology;  Laterality: N/A;   REMOVAL OF STONES  09/20/2023   Procedure: REMOVAL OF STONES;  Surgeon: Lynann Bologna, MD;  Location: [facility] ENDOSCOPY;  Service: Gastroenterology;;   Dennison Mascot  09/20/2023   Procedure: Dennison Mascot;  Surgeon: Lynann Bologna, MD;  Location: [facility] ENDOSCOPY;  Service: Gastroenterology;;   Dennison Mascot  09/20/2023   Procedure: Darien Ramus;  Surgeon: Lynann Bologna, MD;  Location: [facility] ENDOSCOPY;  Service: Gastroenterology;;  SUBMUCOSAL TATTOO INJECTION  01/10/2022   Procedure: SUBMUCOSAL TATTOO INJECTION;  Surgeon: Dolores Frame, MD;  [facility];  Service: Gastroenterology;;   SURGERY OF LIP     skin cancer     Discharged Condition: good  Hospital Course: Oncologist requested repeat thermal ablation of liver mass from metastatic colorectal CA to liver. Dr. [name] performed procedure 10/10/23  under general anaesthesia and admitted patient to [facility] for overnight observation. On 3/13, patient reporting no concerns, AM CBC discussed with IR MD. Pt is tolerating PO intake, voiding spontaneously, afebrile, reports no increasing abd pain or bleeding from puncture sites for ablations. Case discussed with Dr. [name] , pt stable for DC, plan for pt to f/u with Dr. [name] in 3 to 4 weeks. Pt to follow up with oncology (Dr. Ellin Saba)  Consults: Anesthesia   Significant Diagnostic Studies:  Results for orders placed or performed during the hospital encounter of 10/10/23  Glucose, capillary   Collection Time: 10/10/23  6:49 AM  Result Value Ref Range   Glucose-Capillary 129 (H) 70 - 99 mg/dL   Comment 1 Notify RN   Type and screen [facility]   Collection Time: 10/10/23  7:22 AM  Result Value Ref Range   ABO/RH(D) O POS    Antibody Screen NEG    Sample Expiration      10/13/2023,2359 Performed at [facility], [address], [place], [place] [address]   Glucose, capillary   Collection Time: 10/10/23 12:15 PM  Result Value Ref Range   Glucose-Capillary 147 (H) 70 - 99 mg/dL   Comment 1 Document in Chart   CBC   Collection Time: 10/11/23  5:22 AM  Result Value Ref Range   WBC 7.8 4.0 - 10.5 K/uL   RBC 3.19 (L) 4.22 - 5.81 MIL/uL   Hemoglobin 10.6 (L) 13.0 - 17.0 g/dL   HCT 60.4 (L) 54.0 - 98.1 %   MCV 101.9 (H) 80.0 - 100.0 fL   MCH 33.2 26.0 - 34.0 pg   MCHC 32.6 30.0 - 36.0 g/dL   RDW 19.1 47.8 - 29.5 %   Platelets 151 150 - 400 K/uL   nRBC 0.0 0.0 - 0.2 %     Treatments: CT guided percutaneous microwave thermal ablation of right lobe metastatic hepatic mass (metastatic colon cancer) on 10/10/23  Discharge Exam: Blood pressure (!) 167/63, pulse 64, temperature (!) 97.4 F (36.3 C), temperature source Oral, resp. rate 16, height 5\' 10"  (1.778 m), weight 200 lb (90.7 kg), SpO2 95%.  Physical Exam: Cardiac RRR with systolic  murmur Lungs: CTAB Abd: RUQ puncture sites (3) not erythematous, mild tenderness, no drainage; otherwise abd nontender Skin: warm/dry Const: pleasant, A&Ox4  Disposition: Discharge disposition: 01-Home or Self Care       Discharge Instructions     Call MD for:  difficulty breathing, headache or visual disturbances   Complete by: As directed    Call MD for:  extreme fatigue   Complete by: As directed    Call MD for:  hives   Complete by: As directed    Call MD for:  persistant dizziness or light-headedness   Complete by: As directed    Call MD for:  persistant nausea and vomiting   Complete by: As directed    Call MD for:  redness, tenderness, or signs of infection (pain, swelling, redness, odor or green/yellow discharge around incision site)   Complete by: As directed    Call MD for:  severe uncontrolled pain   Complete by: As directed    Call MD for:  temperature >100.4   Complete by: As directed    Change dressing (specify)   Complete by: As directed    May change bandage over R abd region daily for the next 2 to 3 days, may wash site with soap and water   Diet - low sodium heart healthy   Complete by: As directed    Discharge instructions   Complete by: As directed    May resume home medications, resume eliquis on 10/12/23; stay well hydrated; avoid strenuous activity for 1 week; contact radiology at [phone] with any questions related to your recent procedure   Driving Restrictions   Complete by: As directed    NO driving for the next 24 hrs or after taking narcotic medications   Increase activity slowly   Complete by: As directed    Lifting restrictions   Complete by: As directed    No heavy lifting for 1 week   May shower / Bathe   Complete by: As directed    May walk up steps   Complete by: As directed       Allergies as of 10/11/2023       Reactions   Gadobenate Nausea And Vomiting   Immediately upon the infusion of 15mL multihance contrast  Patient  had exteme nausea and vomiting.   No other symptoms noted .  No injury.  MRI scan was completed after patient felt better.   Immediately upon the infusion of 15mL multihance contrast  Patient had exteme nausea and vomiting.   No other symptoms noted .  No injury.  MRI scan was completed after patient felt better.          Medication List     TAKE these medications    amitriptyline 10 MG tablet Commonly known as: ELAVIL Take 10 mg by mouth every evening.   amLODipine 5 MG tablet Commonly known as: NORVASC Take 5 mg by mouth 2 (two) times daily.   amoxicillin-clavulanate 875-125 MG tablet Commonly known as: AUGMENTIN Take 1 tablet by mouth 2 (two) times daily.   atorvastatin 40 MG tablet Commonly known as: LIPITOR Take 40 mg by mouth every evening.   carvedilol 6.25 MG tablet Commonly known as: COREG Take 6.25 mg by mouth 2 (two) times daily with a meal.   cyanocobalamin 1000 MCG tablet Commonly known as: VITAMIN B12 Take 1,000 mcg by mouth every evening. Now (Brand)   dorzolamide 2 % ophthalmic solution Commonly known as: TRUSOPT Place 1 drop into the left eye 2 (two) times daily.   Eliquis 5 MG Tabs tablet Generic drug: apixaban TAKE 1 TABLET(5 MG) BY MOUTH TWICE DAILY   ferrous sulfate 325 (65 FE) MG tablet Take 325 mg by mouth daily with breakfast.   folic acid 1 MG tablet Commonly known as: FOLVITE Take 1 tablet (1 mg total) by mouth every evening.   hydrALAZINE 25 MG tablet Commonly known as: APRESOLINE Take 50 mg by mouth in the morning and at bedtime.   HYDROcodone-acetaminophen 5-325 MG tablet Commonly known as: Norco Take 1 tablet by mouth every 8 (eight) hours as needed for moderate pain (pain score 4-6).   Kerendia 10 MG Tabs Generic drug: Finerenone Take 10 mg by mouth daily.   latanoprost 0.005 % ophthalmic solution Commonly known as: XALATAN Place 1 drop into both eyes at bedtime.   linagliptin 5 MG Tabs tablet Commonly known as:  TRADJENTA  Take 5 mg by mouth in the morning.   loratadine 10 MG tablet Commonly known as: CLARITIN Take 10 mg by mouth every morning.   omeprazole 40 MG capsule Commonly known as: PRILOSEC Take 1 capsule (40 mg total) by mouth daily.   ondansetron 4 MG disintegrating tablet Commonly known as: ZOFRAN-ODT 4mg  ODT q4 hours prn nausea/vomit   ondansetron 4 MG tablet Commonly known as: ZOFRAN Take 1 tablet (4 mg total) by mouth every 6 (six) hours as needed for nausea.   PROSTATE SUPPORT PO Take 1 capsule by mouth every evening. NOW Prostate Support   tobramycin 0.3 % ophthalmic solution Commonly known as: TOBREX INSTILL 5 DROPS TO RIGHT EAR TWICE DAILY FOR 7 DAYS   Vitamin C/Natural Rose Hips 1000 MG tablet Generic drug: ascorbic acid Take 1,000 mg by mouth every evening.   Vitamin D3 125 MCG (5000 UT) Caps Take 5,000 Units by mouth every evening.               Discharge Care Instructions  (From admission, onward)           Start     Ordered   10/11/23 0000  Change dressing (specify)       Comments: May change bandage over R abd region daily for the next 2 to 3 days, may wash site with soap and water   10/11/23 0957            Follow-up Information     Irish Lack, MD Follow up.   Specialties: Interventional Radiology, Radiology Why: Dr. [name] will contact you in 3-4 weeks to check status; call [phone] with any questions. Contact information: [address] [place] [address] [phone]         [name], MD Follow up.   Specialty: Hematology Why: Follow up with Dr. Ellin Saba as scheduled Contact information: [address] [place] [address] [phone]                  Electronically Signed: Carlton Adam, NP 10/11/2023, 10:00 AM   I have spent Less Than 30 Minutes discharging Joellyn Haff.

## 2023-10-11 NOTE — Plan of Care (Signed)
  Problem: Coping: Goal: Level of anxiety will decrease Outcome: Progressing   Problem: Nutrition: Goal: Adequate nutrition will be maintained Outcome: Completed/Met

## 2023-10-11 NOTE — Discharge Instructions (Signed)
 May resume home medications, may resume eliquis 3/14, stay hydrated, avoid strenuous activity for 1 week

## 2023-10-11 NOTE — Progress Notes (Signed)
 Discharge instructions given to patient and all questions were answered.

## 2023-10-11 NOTE — Progress Notes (Signed)
   10/11/23 0959  TOC Brief Assessment  Insurance and Status Reviewed  Patient has primary care physician Yes  Home environment has been reviewed resides with spouse in private residence  Prior level of function: Independent  Prior/Current Home Services No current home services  Social Drivers of Health Review SDOH reviewed no interventions necessary  Readmission risk has been reviewed Yes  Transition of care needs no transition of care needs at this time

## 2023-10-11 NOTE — TOC Transition Note (Deleted)
 Transition of Care [facility]) - Discharge Note   Patient Details  Name: Herbert Wood MRN: [id number] Date of Birth: [birth year]  Transition of Care [facility]) CM/SW Contact:  Howell Rucks, RN Phone Number: 10/11/2023, 10:00 AM   Clinical Narrative:   Met with pt at bedside, RW delivered to room, hospital bed delivered to home yesterday. NCM reviewed PT recommendation for [facility] PT, informed pt unable to find [facility] agency that accepted her insurance, offer OP PT option, pt declined at this time, states she will follow up with her PCP/Surgeon. No further TOC needs identified.     Final next level of care: Home/Self Care Barriers to Discharge: Barriers Resolved   Patient Goals and CMS Choice Patient states their goals for this hospitalization and ongoing recovery are:: return home          Discharge Placement                       Discharge Plan and Services Additional resources added to the After Visit Summary for                                       Social Drivers of Health (SDOH) Interventions SDOH Screenings   Food Insecurity: No Food Insecurity (10/10/2023)  Housing: Low Risk  (10/10/2023)  Transportation Needs: No Transportation Needs (10/10/2023)  Utilities: Not At Risk (10/10/2023)  Social Connections: Moderately Integrated (10/10/2023)  Tobacco Use: Medium Risk (10/10/2023)     Readmission Risk Interventions    09/17/2023   11:57 AM 02/18/2022   11:07 AM  Readmission Risk Prevention Plan  Transportation Screening Complete Complete  HRI or Home Care Consult Complete Complete  Social Work Consult for Recovery Care Planning/Counseling Complete Complete  Palliative Care Screening Not Applicable Not Applicable  Medication Review Oceanographer) Complete

## 2023-10-17 ENCOUNTER — Inpatient Hospital Stay

## 2023-10-17 ENCOUNTER — Other Ambulatory Visit: Payer: Self-pay | Admitting: *Deleted

## 2023-10-17 ENCOUNTER — Inpatient Hospital Stay (HOSPITAL_BASED_OUTPATIENT_CLINIC_OR_DEPARTMENT_OTHER): Payer: Medicare Other | Admitting: Hematology

## 2023-10-17 VITALS — BP 106/63 | HR 58 | Temp 97.6°F | Resp 20 | Wt 194.7 lb

## 2023-10-17 DIAGNOSIS — K769 Liver disease, unspecified: Secondary | ICD-10-CM | POA: Diagnosis not present

## 2023-10-17 DIAGNOSIS — C187 Malignant neoplasm of sigmoid colon: Secondary | ICD-10-CM | POA: Diagnosis not present

## 2023-10-17 DIAGNOSIS — Z85038 Personal history of other malignant neoplasm of large intestine: Secondary | ICD-10-CM | POA: Diagnosis not present

## 2023-10-17 LAB — HEPATIC FUNCTION PANEL
ALT: 65 U/L — ABNORMAL HIGH (ref 0–44)
AST: 61 U/L — ABNORMAL HIGH (ref 15–41)
Albumin: 2.9 g/dL — ABNORMAL LOW (ref 3.5–5.0)
Alkaline Phosphatase: 219 U/L — ABNORMAL HIGH (ref 38–126)
Bilirubin, Direct: 0.4 mg/dL — ABNORMAL HIGH (ref 0.0–0.2)
Indirect Bilirubin: 0.6 mg/dL (ref 0.3–0.9)
Total Bilirubin: 1 mg/dL (ref 0.0–1.2)
Total Protein: 6.7 g/dL (ref 6.5–8.1)

## 2023-10-17 MED ORDER — TRAMADOL HCL 50 MG PO TABS: 50.0000 mg | ORAL_TABLET | Freq: Every evening | ORAL | 0 refills | Status: AC | PRN

## 2023-10-17 NOTE — Patient Instructions (Addendum)
 [facility] at [facility] Discharge Instructions   You were seen and examined today by Dr. Ellin Saba.  We have checked your liver function tests today. Those results are pending.   We will see you back in 1 month. We will repeat lab work at that time.   Return as scheduled.    Thank you for choosing [facility] at [facility] to provide your oncology and hematology care.  To afford each patient quality time with our provider, please arrive at least 15 minutes before your scheduled appointment time.   If you have a lab appointment with the Cancer Center please come in thru the Main Entrance and check in at the main information desk.  You need to re-schedule your appointment should you arrive 10 or more minutes late.  We strive to give you quality time with our providers, and arriving late affects you and other patients whose appointments are after yours.  Also, if you no show three or more times for appointments you may be dismissed from the clinic at the providers discretion.     Again, thank you for choosing [facility].  Our hope is that these requests will decrease the amount of time that you wait before being seen by our physicians.       _____________________________________________________________  Should you have questions after your visit to [facility], please contact our office at [phone] and follow the prompts.  Our office hours are 8:00 a.m. and 4:30 p.m. Monday - Friday.  Please note that voicemails left after 4:00 p.m. may not be returned until the following business day.  We are closed weekends and major holidays.  You do have access to a nurse 24-7, just call the main number to the clinic [phone] and do not press any options, hold on the line and a nurse will answer the phone.    For prescription refill requests, have your pharmacy contact our office and allow 72 hours.    Due to Covid, you  will need to wear a mask upon entering the hospital. If you do not have a mask, a mask will be given to you at the Main Entrance upon arrival. For doctor visits, patients may have 1 support person age 52 or older with them. For treatment visits, patients can not have anyone with them due to social distancing guidelines and our immunocompromised population.

## 2023-10-17 NOTE — Progress Notes (Signed)
 [facility] [address], [place] [address]    Clinic Day:  10/17/2023  Referring physician: [name], Wood  Patient Care Team: Herbert Cosier, Wood as PCP - General (Internal Medicine) Herbert Sarah, RN as Oncology Nurse Navigator (Medical Oncology) Herbert Massed, Wood as Medical Oncologist (Medical Oncology)   ASSESSMENT & PLAN:   Assessment: 1. Stage II (T3N0) sigmoid colon cancer, MMR preserved: - Anemia with hemoglobin of 8, recent admission with diverticulitis.  He also had weight loss of 40 pounds in the last 6 months. - Colonoscopy by Dr. Levon Wood (01/10/2022): Fungating and ulcerated partially obstructing mass found at 42 cm proximal to the anus.  Mass is circumferential and measured 3 cm in length. - Pathology: Invasive moderately differentiated adenocarcinoma of the descending colon mass.  Transverse colon polypectomy shows fragments of at least intramucosal adenocarcinoma and high-grade dysplasia. - CT CAP (01/13/2022): No evidence of metastatic disease in the chest, abdomen or pelvis.  Several tiny lung nodules and scattered areas of interstitial irregularity in the right upper lobe likely postinflammatory. - Preoperative CEA (01/10/2022): 7.9. - Left hemicolectomy (02/17/2022): Moderately differentiated grade 2 adenocarcinoma, no perforation, no lymphovascular/perineural invasion, margins negative.  0/21 lymph nodes involved.  PT3 pN0. - PET scan on 11/09/2022: 3.2 x 2.3 cm right hepatic lobe metastasis. - MRI of the liver on 11/22/2022: Isolated inferior right hepatic lobe 2.8 cm. - Right lobe lesion thermal ablation on 12/27/2022. - Percutaneous microwave thermal ablation of the right lobe liver mass on 10/10/2023   2. Social/family history: - Lives at home with his wife.  Wife works at [employer] nephrology office in [place].  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  Quit smoking in 1995.  Smoked 1 to 2 packs/day for  25 years. - Son had DVT.  Son is also a carrier for factor V Leiden.  Mother had bladder cancer and uterine cancer.   3.  Left leg DVT: - Patient had a left leg DVT approximately 2 years ago.  His wife thinks that DVT has happened about 4 to 6 weeks after his second COVID infection.  He was very minimally symptomatic from the COVID infection.  At the time of the DVT, he was completely mobile and did not have any surgery. - Patient was heterozygous for factor V Leiden as checked by Dr. Claude Wood on 09/20/2011. - I have recommended that he get the records of timing of DVT and the COVID test.  If it is within 4 to 6 weeks of his COVID diagnosis, we may safely discontinue anticoagulation considering it can be provoked by COVID infection.  If it is unprovoked DVT, may continue low intensity anticoagulation.   4.  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 - Adjuvant XRT 50 Gray, completed 08/11/2015.    Plan: 1.  Stage II (T3N0) sigmoid colon cancer: - MRI abdomen from 07/02/2023: Residual heterogeneous contrast-enhancement of the posterior margin of the lesion previously treated. - PET scan (07/19/2023): 3.7 cm right hepatic metastasis reflecting with viable tumor.  No evidence of metastatic disease. - He underwent microwave thermal ablation of the right lobe hepatic mass on 10/10/2023. - Prior to that he was hospitalized from 09/16/2023 through 09/21/2023 with Klebsiella pneumonia sepsis, underwent ERCP, sphincterotomy and removal of 2 stones. - He is taking longer to bounce back and still feels lethargic.  Appetite is still low but improving. -  Reviewed LFTs today: AST 61 and ALT 65.  Total bilirubin is normal. - RTC 4 weeks for follow-up.    2.  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  He is taking iron tablet daily. - Last ferritin was 168 and percent saturation 33 on 10/08/2023.    3.  Left leg DVT: - He will continue Eliquis.  No bleeding issues reported.  4.  Low back pain: - Low back pain has improved.  He has right lateral and posterior rib pain since procedure.  He is taking tramadol as needed at bedtime.  He is not taking hydrocodone.  Will send refill for tramadol.    Orders Placed This Encounter  Procedures   Hepatic function panel    Standing Status:   Future    Number of Occurrences:   1    Expected Date:   10/17/2023    Expiration Date:   10/16/2024   CBC with Differential    Standing Status:   Future    Expected Date:   11/14/2023    Expiration Date:   10/16/2024   Comprehensive metabolic panel    Standing Status:   Future    Expected Date:   11/14/2023    Expiration Date:   10/16/2024      Herbert Wood for Herbert Massed, Wood.,have documented all relevant documentation on the behalf of Herbert Massed, Wood,as directed by  Herbert Massed, Wood while in the presence of Herbert Massed, Wood.  I, Herbert Wood, have reviewed the above documentation for accuracy and completeness, and I agree with the above.      Herbert Massed, Wood   3/19/20254:13 PM  CHIEF COMPLAINT:   Diagnosis: sigmoid colon cancer and iron deficiency anemia    Cancer Staging  Cancer of sigmoid colon [facility]) Staging form: Colon and Rectum, AJCC 8th Edition - Clinical stage from 01/25/2022: Stage IIA (cT3, cN0, cM0) - Signed by Herbert Massed, Wood on 03/16/2022    Prior Therapy: 1. Left hemicolectomy on 02/17/2022  2. Microwave ablation of liver metastasis, 12/27/22  Current Therapy:  Surveillance    HISTORY OF PRESENT ILLNESS:   Oncology History  Cancer of sigmoid colon (HCC)  01/25/2022 Initial Diagnosis   Cancer of sigmoid colon (HCC)   01/25/2022 Cancer Staging   Staging form: Colon and Rectum, AJCC 8th Edition - Clinical stage from 01/25/2022: Stage IIA (cT3, cN0, cM0) - Signed by Herbert Massed, Wood on  03/16/2022 Histopathologic type: Adenocarcinoma, NOS Stage prefix: Initial diagnosis Total positive nodes: 0 Total nodes examined: 21 Histologic grade (G): G2 Histologic grading system: 4 grade system Microsatellite instability (MSI): Stable      INTERVAL HISTORY:   Herbert Wood is a 89 y.o. male presenting to clinic today for follow up of sigmoid colon cancer and iron deficiency anemia. He was last seen by me on 08/02/23.  Since his last visit, he underwent liver ablation on 10/10/23 with Dr. [name].   Thadius was admitted to the hospital from 09/16/23 to 09/21/23 for sepsis and acute ascending cholangitis with choledocholithiasis. He was treated with IV Rocephin, transitioned to po Augmentin. Azeem underwent ERCP on 2/20 and had 2 stones removed along with a sphincterotomy.   Today, he states that he is doing well overall. His appetite level is at 75%. His energy level is at 50%.   PAST MEDICAL HISTORY:   Past Medical History: Past Medical History:  Diagnosis Date   Adenomatous colon polyp    Anemia  Carotid bruit    Chronic dyspnea    Diabetes (HCC)    type 2   Diverticular disease    DVT (deep venous thrombosis) (HCC)    lower limb   Dyspnea    Factor V deficiency (HCC)    GERD (gastroesophageal reflux disease)    Hearing loss    bilateral   Hyperlipemia    Hypertension    Inguinal hernia    Kidney disease    stage 3   Lung nodule    Lymphadenopathy    Malignant neoplasm (HCC)    salivary gland   Murmur    Osteoarthritis    Senile purpura (HCC)     Surgical History: Past Surgical History:  Procedure Laterality Date   BIOPSY  01/10/2022   Procedure: BIOPSY;  Surgeon: Dolores Frame, Wood;  [facility];  Service: Gastroenterology;;   COLON SURGERY     COLONOSCOPY WITH PROPOFOL N/A 01/10/2022   Procedure: COLONOSCOPY WITH PROPOFOL;  Surgeon: Dolores Frame, Wood;  Location: [facility];  Service: Gastroenterology;  Laterality: N/A;   per Soledad Gerlach, pt to arrive at 9:15   COLONOSCOPY WITH PROPOFOL N/A 02/27/2023   Procedure: COLONOSCOPY WITH PROPOFOL;  Surgeon: Dolores Frame, Wood;  Location: [facility];  Service: Gastroenterology;  Laterality: N/A;  9:15am;asa 3   ENDOSCOPIC RETROGRADE CHOLANGIOPANCREATOGRAPHY (ERCP) WITH PROPOFOL N/A 09/20/2023   Procedure: ENDOSCOPIC RETROGRADE CHOLANGIOPANCREATOGRAPHY (ERCP) WITH PROPOFOL;  Surgeon: Lynann Bologna, Wood;  [facility] END[facility];  Service: Gastroenterology;  Laterality: N/A;   ESOPHAGOGASTRODUODENOSCOPY (EGD) WITH PROPOFOL N/A 01/10/2022   Procedure: ESOPHAGOGASTRODUODENOSCOPY (EGD) WITH PROPOFOL;  Surgeon: Dolores Frame, Wood;  [facility]: [facility];  Service: Gastroenterology;  Laterality: N/A;   HAND / FINGER LESION EXCISION     HERNIA REPAIR     IR RADIOLOGIST EVAL & MGMT  04/04/2023   IR RADIOLOGIST EVAL & MGMT  08/09/2023   MASTOIDECTOMY     PILONIDAL CYST EXCISION     POLYPECTOMY  01/10/2022   Procedure: POLYPECTOMY;  Surgeon: Dolores Frame, Wood;  [facility]: [facility];  Service: Gastroenterology;;   POLYPECTOMY  02/27/2023   Procedure: POLYPECTOMY;  Surgeon: Dolores Frame, Wood;  [facility]: [facility];  Service: Gastroenterology;;   RADIOLOGY WITH ANESTHESIA N/A 12/27/2022   Procedure: CT microwave ablation of the liver;  Surgeon: Irish Lack, Wood;  Location: [facility];  Service: Radiology;  Laterality: N/A;   RADIOLOGY WITH ANESTHESIA N/A 10/10/2023   Procedure: CT WITH ANESTHESIA MICROWAVE ABLATION OF LIVER;  Surgeon: Irish Lack, Wood;  Location: [facility];  Service: Radiology;  Laterality: N/A;   REMOVAL OF STONES  09/20/2023   Procedure: REMOVAL OF STONES;  Surgeon: Lynann Bologna, Wood;  Location: [facility] ENDOSCOPY;  Service: Gastroenterology;;   Dennison Mascot  09/20/2023   Procedure: Dennison Mascot;  Surgeon: Lynann Bologna, Wood;  Location: [facility] ENDOSCOPY;  Service: Gastroenterology;;   Dennison Mascot  09/20/2023    Procedure: Darien Ramus;  Surgeon: Lynann Bologna, Wood;  Location: [facility] ENDOSCOPY;  Service: Gastroenterology;;   SUBMUCOSAL TATTOO INJECTION  01/10/2022   Procedure: SUBMUCOSAL TATTOO INJECTION;  Surgeon: Dolores Frame, Wood;  Location: [facility];  Service: Gastroenterology;;   SURGERY OF LIP     skin cancer    Social History: Social History   Socioeconomic History   Marital status: Married    Spouse name: Not on file   Number of children: Not on file   Years of education: Not on file   Highest education level:  Not on file  Occupational History   Not on file  Tobacco Use   Smoking status: Former    Current packs/day: 0.00    Average packs/day: 2.0 packs/day for 25.0 years (50.0 ttl pk-yrs)    Types: Cigarettes    Start date: 89    Quit date: 64    Years since quitting: 30.2    Passive exposure: Past   Smokeless tobacco: Never  Vaping Use   Vaping status: Never Used  Substance and Sexual Activity   Alcohol use: Never   Drug use: Never   Sexual activity: Not on file  Other Topics Concern   Not on file  Social History Narrative   Not on file   Social Drivers of Health   Financial Resource Strain: Not on file  Food Insecurity: No Food Insecurity (10/10/2023)   Hunger Vital Sign    Worried About Running Out of Food in the Last Year: Never true    Ran Out of Food in the Last Year: Never true  Transportation Needs: No Transportation Needs (10/10/2023)   PRAPARE - Administrator, Civil Service (Medical): No    Lack of Transportation (Non-Medical): No  Physical Activity: Not on file  Stress: Not on file  Social Connections: Moderately Integrated (10/10/2023)   Social Connection and Isolation Panel [NHANES]    Frequency of Communication with Friends and Family: More than three times a week    Frequency of Social Gatherings with Friends and Family: Once a week    Attends Religious Services: More than 4 times per year    Active Member of [employer]  or Organizations: No    Attends Banker Meetings: Never    Marital Status: Married  Catering manager Violence: Not At Risk (10/10/2023)   Humiliation, Afraid, Rape, and Kick questionnaire    Fear of Current or Ex-Partner: No    Emotionally Abused: No    Physically Abused: No    Sexually Abused: No    Family History: No family history on file.  Current Medications:  Current Outpatient Medications:    amitriptyline (ELAVIL) 10 MG tablet, Take 10 mg by mouth every evening., Disp: , Rfl:    amLODipine (NORVASC) 5 MG tablet, Take 5 mg by mouth 2 (two) times daily., Disp: , Rfl:    amoxicillin-clavulanate (AUGMENTIN) 875-125 MG tablet, Take 1 tablet by mouth 2 (two) times daily., Disp: 10 tablet, Rfl: 0   ascorbic acid (VITAMIN C/NATURAL ROSE HIPS) 1000 MG tablet, Take 1,000 mg by mouth every evening., Disp: , Rfl:    atorvastatin (LIPITOR) 40 MG tablet, Take 40 mg by mouth every evening., Disp: , Rfl:    carvedilol (COREG) 6.25 MG tablet, Take 6.25 mg by mouth 2 (two) times daily with a meal., Disp: , Rfl:    Cholecalciferol (VITAMIN D3) 125 MCG (5000 UT) CAPS, Take 5,000 Units by mouth every evening., Disp: , Rfl:    dorzolamide (TRUSOPT) 2 % ophthalmic solution, Place 1 drop into the left eye 2 (two) times daily., Disp: , Rfl:    ELIQUIS 5 MG TABS tablet, TAKE 1 TABLET(5 MG) BY MOUTH TWICE DAILY, Disp: 60 tablet, Rfl: 3   ferrous sulfate 325 (65 FE) MG tablet, Take 325 mg by mouth daily with breakfast., Disp: , Rfl:    folic acid (FOLVITE) 1 MG tablet, Take 1 tablet (1 mg total) by mouth every evening., Disp: 30 tablet, Rfl: 0   hydrALAZINE (APRESOLINE) 25 MG tablet, Take 50 mg by mouth  in the morning and at bedtime., Disp: , Rfl:    HYDROcodone-acetaminophen (NORCO) 5-325 MG tablet, Take 1 tablet by mouth every 8 (eight) hours as needed for moderate pain (pain score 4-6)., Disp: 15 tablet, Rfl: 0   KERENDIA 10 MG TABS, Take 10 mg by mouth daily., Disp: , Rfl:    latanoprost  (XALATAN) 0.005 % ophthalmic solution, Place 1 drop into both eyes at bedtime., Disp: , Rfl:    linagliptin (TRADJENTA) 5 MG TABS tablet, Take 5 mg by mouth in the morning., Disp: , Rfl:    loratadine (CLARITIN) 10 MG tablet, Take 10 mg by mouth every morning., Disp: , Rfl:    Misc Natural Products (PROSTATE SUPPORT PO), Take 1 capsule by mouth every evening. NOW Prostate Support, Disp: , Rfl:    omeprazole (PRILOSEC) 40 MG capsule, Take 1 capsule (40 mg total) by mouth daily., Disp: 90 capsule, Rfl: 3   ondansetron (ZOFRAN) 4 MG tablet, Take 1 tablet (4 mg total) by mouth every 6 (six) hours as needed for nausea., Disp: 20 tablet, Rfl: 0   ondansetron (ZOFRAN-ODT) 4 MG disintegrating tablet, 4mg  ODT q4 hours prn nausea/vomit, Disp: 10 tablet, Rfl: 0   tobramycin (TOBREX) 0.3 % ophthalmic solution, INSTILL 5 DROPS TO RIGHT EAR TWICE DAILY FOR 7 DAYS, Disp: , Rfl:    traMADol (ULTRAM) 50 MG tablet, Take 1 tablet (50 mg total) by mouth at bedtime as needed., Disp: 30 tablet, Rfl: 0   vitamin B-12 (CYANOCOBALAMIN) 1000 MCG tablet, Take 1,000 mcg by mouth every evening. Now (Brand), Disp: , Rfl:    Allergies: Allergies  Allergen Reactions   Gadobenate Nausea And Vomiting    Immediately upon the infusion of 15mL multihance contrast  Patient had exteme nausea and vomiting.   No other symptoms noted .  No injury.  MRI scan was completed after patient felt better.   Immediately upon the infusion of 15mL multihance contrast  Patient had exteme nausea and vomiting.   No other symptoms noted .  No injury.  MRI scan was completed after patient felt better.       REVIEW OF SYSTEMS:   Review of Systems  Constitutional:  Positive for fatigue. Negative for chills and fever.  HENT:   Negative for lump/mass, mouth sores, nosebleeds, sore throat and trouble swallowing.   Eyes:  Negative for eye problems.  Respiratory:  Positive for shortness of breath. Negative for cough.   Cardiovascular:  Negative for  chest pain, leg swelling and palpitations.  Gastrointestinal:  Negative for abdominal pain, constipation, diarrhea, nausea and vomiting.  Genitourinary:  Negative for bladder incontinence, difficulty urinating, dysuria, frequency, hematuria and nocturia.   Musculoskeletal:  Negative for arthralgias, back pain, flank pain, myalgias and neck pain.  Skin:  Negative for itching and rash.  Neurological:  Positive for dizziness and numbness. Negative for headaches.  Hematological:  Does not bruise/bleed easily.  Psychiatric/Behavioral:  Positive for sleep disturbance. Negative for depression and suicidal ideas. The patient is not nervous/anxious.   All other systems reviewed and are negative.    VITALS:   Blood pressure 106/63, pulse (!) 58, temperature 97.6 F (36.4 C), temperature source Tympanic, resp. rate 20, weight 194 lb 10.7 oz (88.3 kg), SpO2 98%.  Wt Readings from Last 3 Encounters:  10/17/23 194 lb 10.7 oz (88.3 kg)  10/10/23 200 lb (90.7 kg)  10/02/23 200 lb (90.7 kg)    Body mass index is 27.93 kg/m.  Performance status (ECOG): 1 - Symptomatic but  completely ambulatory  PHYSICAL EXAM:   Physical Exam Vitals and nursing note reviewed. Exam conducted with a chaperone present.  Constitutional:      Appearance: Normal appearance.  Cardiovascular:     Rate and Rhythm: Normal rate and regular rhythm.     Pulses: Normal pulses.     Heart sounds: Normal heart sounds.  Pulmonary:     Effort: Pulmonary effort is normal.     Breath sounds: Normal breath sounds.  Abdominal:     Palpations: Abdomen is soft. There is no hepatomegaly, splenomegaly or mass.     Tenderness: There is no abdominal tenderness.  Musculoskeletal:     Right lower leg: No edema.     Left lower leg: No edema.  Lymphadenopathy:     Cervical: No cervical adenopathy.     Right cervical: No superficial, deep or posterior cervical adenopathy.    Left cervical: No superficial, deep or posterior cervical  adenopathy.     Upper Body:     Right upper body: No supraclavicular or axillary adenopathy.     Left upper body: No supraclavicular or axillary adenopathy.  Neurological:     General: No focal deficit present.     Mental Status: He is alert and oriented to person, place, and time.  Psychiatric:        Mood and Affect: Mood normal.        Behavior: Behavior normal.     LABS:      Latest Ref Rng & Units 10/11/2023    5:22 AM 10/08/2023    9:05 AM 09/21/2023    6:24 AM  CBC  WBC 4.0 - 10.5 K/uL 7.8  7.5  6.7   Hemoglobin 13.0 - 17.0 g/dL 19.1  47.8  29.5   Hematocrit 39.0 - 52.0 % 32.5  34.2  31.1   Platelets 150 - 400 K/uL 151  180  159       Latest Ref Rng & Units 10/17/2023    2:38 PM 10/10/2023    7:22 AM 10/08/2023    9:05 AM  CMP  Glucose 70 - 99 mg/dL  621  308   BUN 8 - 23 mg/dL  28  27   Creatinine 6.57 - 1.24 mg/dL  8.46  9.62   Sodium 952 - 145 mmol/L  139  138   Potassium 3.5 - 5.1 mmol/L  3.8  4.1   Chloride 98 - 111 mmol/L  108  106   CO2 22 - 32 mmol/L  21  23   Calcium 8.9 - 10.3 mg/dL  9.1  9.1   Total Protein 6.5 - 8.1 g/dL 6.7  6.5  6.8   Total Bilirubin 0.0 - 1.2 mg/dL 1.0  2.0  1.9   Alkaline Phos 38 - 126 U/L 219  167  169   AST 15 - 41 U/L 61  37  46   ALT 0 - 44 U/L 65  42  50      Lab Results  Component Value Date   CEA1 94.8 (H) 10/08/2023   /  CEA  Date Value Ref Range Status  10/08/2023 94.8 (H) 0.0 - 4.7 ng/mL Final    Comment:    (NOTE)                             Nonsmokers          <3.9  Smokers             <5.6 Roche [employer] Electrochemiluminescence Immunoassay (ECLIA) Values obtained with different assay methods or kits cannot be used interchangeably.  Results cannot be interpreted as absolute evidence of the presence or absence of malignant disease. Performed At: [facility] [address] [place], [place] [id number] Jolene Schimke Wood LK:[id number]    No results found for:  "PSA1" No results found for: "GUY403" No results found for: "CAN125"  Lab Results  Component Value Date   TOTALPROTELP 6.3 01/25/2022   ALBUMINELP 3.6 01/25/2022   A1GS 0.2 01/25/2022   A2GS 0.7 01/25/2022   BETS 0.9 01/25/2022   GAMS 0.9 01/25/2022   MSPIKE Not Observed 01/25/2022   SPEI Comment 01/25/2022   Lab Results  Component Value Date   TIBC 322 10/08/2023   TIBC 290 08/02/2023   TIBC 327 06/27/2023   FERRITIN 168 10/08/2023   FERRITIN 142 08/02/2023   FERRITIN 87 06/27/2023   IRONPCTSAT 33 10/08/2023   IRONPCTSAT 31 08/02/2023   IRONPCTSAT 43 (H) 06/27/2023   No results found for: "LDH"   STUDIES:   CT GUIDE TISSUE ABLATION Result Date: 10/10/2023 CLINICAL DATA:  Recurrence of metastatic disease from colorectal carcinoma in the right lobe of the liver after prior thermal ablation 12/27/2022. The patient presents for additional thermal ablation. EXAM: CT-GUIDED PERCUTANEOUS THERMAL ABLATION OF LIVER COMPARISON:  MRI of the abdomen on 09/18/2023 ANESTHESIA/SEDATION: Anesthesia:  General Medications: 2 g IV cefoxitin. The antibiotic was administered in an appropriate time interval prior to needle puncture of the skin. CONTRAST:  None. PROCEDURE: The procedure, risks, benefits, and alternatives were explained to the patient. Questions regarding the procedure were encouraged and answered. The patient understands and consents to the procedure. The patient was placed under general anesthesia. Initial unenhanced CT was performed in a supine position to localize the liver. The right lateral abdominal wall was prepped with chlorhexidine in a sterile fashion, and a sterile drape was applied covering the operative field. A sterile gown and sterile gloves were used for the procedure. Under combined CT and ultrasound guidance, a total of 3 separate NeuWave XT microwave thermal ablation probes were advanced into the right lobe of the liver at the level of a lesion. Probe positioning was  confirmed by CT prior to ablation. Prior to ablation, hydrodissection was also performed at the level of the inferior gallbladder fossa and at the tip of the right lobe of the liver via 22 gauge spinal needles with installation of hydrodissection fluid which consisted of a dilution of 5 mL of Omnipaque 300 contrast in 250 mL of saline. A total volume of approximately 180 mL of hydrodissection fluid was utilized for the procedure. Microwave thermal ablation was performed through the 3 probes simultaneously. Ablation was performed at 65 watts for 10 minutes. Post-procedural CT was performed. Tract ablation was then performed through each probe separately as the probes were individually retracted and removed. COMPLICATIONS: None FINDINGS: Enlarging and recurrent metastatic lesion within the inferior aspect of the right lobe of the liver was localized by both unenhanced CT and ultrasound. Prior to ablation, hydrodissection was performed at the level of the gallbladder fossa as well as at the tip of the liver in order to provide increased separation between the hepatic flexure of the colon and the liver. Adequate ablation zone was imaged by both CT and ultrasound during and immediately following ablation. IMPRESSION: CT guided percutaneous microwave thermal ablation of right lobe metastatic hepatic mass. The  patient will be observed overnight. Electronically Signed   By: Irish Lack M.D.   On: 10/10/2023 13:16   DG ERCP Result Date: 09/20/2023 CLINICAL DATA:  Choledocholithiasis. EXAM: ERCP TECHNIQUE: Multiple spot images obtained with the fluoroscopic device and submitted for interpretation post-procedure. COMPARISON:  MRI/MRCP on 09/18/2023 FINDINGS: Imaging obtained with a C-arm demonstrates cannulation of the common bile duct with cholangiogram demonstrating discrete filling defects within the proximal and mid aspects of a dilated common bile duct. Balloon sweep maneuver was performed to remove calculi.  Completion cholangiogram shows no further filling defects. IMPRESSION: Choledocholithiasis confirmed in the common bile duct with balloon sweep maneuver to remove calculi. These images were submitted for radiologic interpretation only. Please see the procedural report for the amount of contrast and the fluoroscopy time utilized. Electronically Signed   By: Irish Lack M.D.   On: 09/20/2023 16:20   MR ABDOMEN MRCP WO CONTRAST Result Date: 09/18/2023 CLINICAL DATA:  Biliary obstruction suspected metastatic colon cancer EXAM: MRI ABDOMEN WITHOUT CONTRAST  (INCLUDING MRCP) TECHNIQUE: Multiplanar multisequence MR imaging of the abdomen was performed. Heavily T2-weighted images of the biliary and pancreatic ducts were obtained, and three-dimensional MRCP images were rendered by post processing. COMPARISON:  Right upper quadrant ultrasound, 09/17/2023 CT chest angiogram, 09/16/2023, PET-CT, 07/28/2023 FINDINGS: Lower chest: Trace pleural effusions. Hepatobiliary: Metastasis of the inferior right lobe of the liver, hepatic segment VI (series 3, image 23). Sludge in the gallbladder. Mild gallbladder wall thickening. Adjacent gallstones in the central common bile duct measuring 1.0 and 0.7 cm (series 4, image 15). Common bile duct measures up to 1.1 cm in caliber within the pancreatic head with mild intrahepatic biliary ductal dilatation. Pancreas: Unremarkable. No pancreatic ductal dilatation or surrounding inflammatory changes. Spleen: Normal in size without significant abnormality. Adrenals/Urinary Tract: Adrenal glands are unremarkable. Numerous bilateral renal cortical and parapelvic cysts, some of which are septated, requiring no specific further follow-up or characterization. No calculi or hydronephrosis. Stomach/Bowel: Stomach is within normal limits. No evidence of bowel wall thickening, distention, or inflammatory changes. Vascular/Lymphatic: No significant vascular findings are present. No enlarged abdominal  lymph nodes. Other: No abdominal wall hernia.  Mild anasarca.  Trace ascites. Musculoskeletal: No acute or significant osseous findings. IMPRESSION: 1. Choledocholithiasis with adjacent gallstones in the central common bile duct measuring 1.0 and 0.7 cm. Common bile duct measures up to 1.1 cm in caliber within the pancreatic head with mild intrahepatic biliary ductal dilatation. 2. Sludge in the gallbladder. Mild gallbladder wall thickening. 3. Known metastasis of the inferior right lobe of the liver, hepatic segment VI. 4. Trace pleural effusions, trace ascites, and mild anasarca. These results will be called to the ordering clinician or representative by the Radiologist Assistant, and communication documented in the PACS or [employer]. Electronically Signed   By: Jearld Lesch M.D.   On: 09/18/2023 11:03   MR 3D Recon At Scanner Result Date: 09/18/2023 CLINICAL DATA:  Biliary obstruction suspected metastatic colon cancer EXAM: MRI ABDOMEN WITHOUT CONTRAST  (INCLUDING MRCP) TECHNIQUE: Multiplanar multisequence MR imaging of the abdomen was performed. Heavily T2-weighted images of the biliary and pancreatic ducts were obtained, and three-dimensional MRCP images were rendered by post processing. COMPARISON:  Right upper quadrant ultrasound, 09/17/2023 CT chest angiogram, 09/16/2023, PET-CT, 07/28/2023 FINDINGS: Lower chest: Trace pleural effusions. Hepatobiliary: Metastasis of the inferior right lobe of the liver, hepatic segment VI (series 3, image 23). Sludge in the gallbladder. Mild gallbladder wall thickening. Adjacent gallstones in the central common bile duct measuring 1.0 and  0.7 cm (series 4, image 15). Common bile duct measures up to 1.1 cm in caliber within the pancreatic head with mild intrahepatic biliary ductal dilatation. Pancreas: Unremarkable. No pancreatic ductal dilatation or surrounding inflammatory changes. Spleen: Normal in size without significant abnormality. Adrenals/Urinary Tract:  Adrenal glands are unremarkable. Numerous bilateral renal cortical and parapelvic cysts, some of which are septated, requiring no specific further follow-up or characterization. No calculi or hydronephrosis. Stomach/Bowel: Stomach is within normal limits. No evidence of bowel wall thickening, distention, or inflammatory changes. Vascular/Lymphatic: No significant vascular findings are present. No enlarged abdominal lymph nodes. Other: No abdominal wall hernia.  Mild anasarca.  Trace ascites. Musculoskeletal: No acute or significant osseous findings. IMPRESSION: 1. Choledocholithiasis with adjacent gallstones in the central common bile duct measuring 1.0 and 0.7 cm. Common bile duct measures up to 1.1 cm in caliber within the pancreatic head with mild intrahepatic biliary ductal dilatation. 2. Sludge in the gallbladder. Mild gallbladder wall thickening. 3. Known metastasis of the inferior right lobe of the liver, hepatic segment VI. 4. Trace pleural effusions, trace ascites, and mild anasarca. These results will be called to the ordering clinician or representative by the Radiologist Assistant, and communication documented in the PACS or [employer]. Electronically Signed   By: Jearld Lesch M.D.   On: 09/18/2023 11:03

## 2023-10-29 ENCOUNTER — Other Ambulatory Visit: Payer: Self-pay | Admitting: Hematology

## 2023-11-14 ENCOUNTER — Inpatient Hospital Stay: Attending: Hematology | Admitting: Hematology

## 2023-11-14 ENCOUNTER — Inpatient Hospital Stay

## 2023-11-14 VITALS — BP 135/56 | HR 51 | Temp 97.8°F | Resp 17 | Ht 70.0 in | Wt 191.0 lb

## 2023-11-14 DIAGNOSIS — Z85818 Personal history of malignant neoplasm of other sites of lip, oral cavity, and pharynx: Secondary | ICD-10-CM | POA: Diagnosis not present

## 2023-11-14 DIAGNOSIS — Z8616 Personal history of COVID-19: Secondary | ICD-10-CM | POA: Insufficient documentation

## 2023-11-14 DIAGNOSIS — D649 Anemia, unspecified: Secondary | ICD-10-CM | POA: Diagnosis not present

## 2023-11-14 DIAGNOSIS — Z87891 Personal history of nicotine dependence: Secondary | ICD-10-CM | POA: Diagnosis not present

## 2023-11-14 DIAGNOSIS — I82402 Acute embolism and thrombosis of unspecified deep veins of left lower extremity: Secondary | ICD-10-CM | POA: Insufficient documentation

## 2023-11-14 DIAGNOSIS — Z9049 Acquired absence of other specified parts of digestive tract: Secondary | ICD-10-CM | POA: Insufficient documentation

## 2023-11-14 DIAGNOSIS — C187 Malignant neoplasm of sigmoid colon: Secondary | ICD-10-CM

## 2023-11-14 DIAGNOSIS — Z7901 Long term (current) use of anticoagulants: Secondary | ICD-10-CM | POA: Insufficient documentation

## 2023-11-14 DIAGNOSIS — M545 Low back pain, unspecified: Secondary | ICD-10-CM | POA: Diagnosis not present

## 2023-11-14 DIAGNOSIS — K769 Liver disease, unspecified: Secondary | ICD-10-CM | POA: Diagnosis not present

## 2023-11-14 DIAGNOSIS — Z85038 Personal history of other malignant neoplasm of large intestine: Secondary | ICD-10-CM | POA: Diagnosis present

## 2023-11-14 LAB — CBC WITH DIFFERENTIAL/PLATELET
Abs Immature Granulocytes: 0.02 10*3/uL (ref 0.00–0.07)
Basophils Absolute: 0.1 10*3/uL (ref 0.0–0.1)
Basophils Relative: 1 %
Eosinophils Absolute: 0.1 10*3/uL (ref 0.0–0.5)
Eosinophils Relative: 1 %
HCT: 34 % — ABNORMAL LOW (ref 39.0–52.0)
Hemoglobin: 11.2 g/dL — ABNORMAL LOW (ref 13.0–17.0)
Immature Granulocytes: 0 %
Lymphocytes Relative: 13 %
Lymphs Abs: 1 10*3/uL (ref 0.7–4.0)
MCH: 33 pg (ref 26.0–34.0)
MCHC: 32.9 g/dL (ref 30.0–36.0)
MCV: 100.3 fL — ABNORMAL HIGH (ref 80.0–100.0)
Monocytes Absolute: 0.9 10*3/uL (ref 0.1–1.0)
Monocytes Relative: 12 %
Neutro Abs: 5.6 10*3/uL (ref 1.7–7.7)
Neutrophils Relative %: 73 %
Platelets: 181 10*3/uL (ref 150–400)
RBC: 3.39 MIL/uL — ABNORMAL LOW (ref 4.22–5.81)
RDW: 13.6 % (ref 11.5–15.5)
WBC: 7.8 10*3/uL (ref 4.0–10.5)
nRBC: 0 % (ref 0.0–0.2)

## 2023-11-14 LAB — COMPREHENSIVE METABOLIC PANEL WITH GFR
ALT: 39 U/L (ref 0–44)
AST: 36 U/L (ref 15–41)
Albumin: 3.4 g/dL — ABNORMAL LOW (ref 3.5–5.0)
Alkaline Phosphatase: 153 U/L — ABNORMAL HIGH (ref 38–126)
Anion gap: 12 (ref 5–15)
BUN: 26 mg/dL — ABNORMAL HIGH (ref 8–23)
CO2: 23 mmol/L (ref 22–32)
Calcium: 9.1 mg/dL (ref 8.9–10.3)
Chloride: 105 mmol/L (ref 98–111)
Creatinine, Ser: 1.21 mg/dL (ref 0.61–1.24)
GFR, Estimated: 58 mL/min — ABNORMAL LOW (ref >60)
Glucose, Bld: 105 mg/dL — ABNORMAL HIGH (ref 70–99)
Potassium: 3.9 mmol/L (ref 3.5–5.1)
Sodium: 140 mmol/L (ref 135–145)
Total Bilirubin: 1.2 mg/dL (ref 0.0–1.2)
Total Protein: 6.8 g/dL (ref 6.5–8.1)

## 2023-11-14 MED ORDER — PREDNISONE 50 MG PO TABS: ORAL_TABLET | ORAL | 0 refills | Status: DC

## 2023-11-14 NOTE — Progress Notes (Signed)
 [facility] [address] [place] [address]    Clinic Day:  11/14/2023  Referring physician: [name], MD  Patient Care Team: [name], MD as PCP - General (Internal Medicine) [name], RN as Oncology Nurse Navigator (Medical Oncology) [name], MD as Medical Oncologist (Medical Oncology)   ASSESSMENT & PLAN:   Assessment: 1. Stage II (T3N0) sigmoid colon cancer, MMR preserved: - Anemia with hemoglobin of 8, recent admission with diverticulitis.  He also had weight loss of 40 pounds in the last 6 months. - Colonoscopy by Dr. [name] (01/10/2022): Fungating and ulcerated partially obstructing mass found at 42 cm proximal to the anus.  Mass is circumferential and measured 3 cm in length. - Pathology: Invasive moderately differentiated adenocarcinoma of the descending colon mass.  Transverse colon polypectomy shows fragments of at least intramucosal adenocarcinoma and high-grade dysplasia. - CT CAP (01/13/2022): No evidence of metastatic disease in the chest, abdomen or pelvis.  Several tiny lung nodules and scattered areas of interstitial irregularity in the right upper lobe likely postinflammatory. - Preoperative CEA (01/10/2022): 7.9. - Left hemicolectomy (02/17/2022): Moderately differentiated grade 2 adenocarcinoma, no perforation, no lymphovascular/perineural invasion, margins negative.  0/21 lymph nodes involved.  PT3 pN0. - PET scan on 11/09/2022: 3.2 x 2.3 cm right hepatic lobe metastasis. - MRI of the liver on 11/22/2022: Isolated inferior right hepatic lobe 2.8 cm. - Right lobe lesion thermal ablation on 12/27/2022. - Percutaneous microwave thermal ablation of the right lobe liver mass on 10/10/2023   2. Social/family history: - Lives at home with his wife.  Wife works at [employer] nephrology office in [place].  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  Quit smoking in 1995.  Smoked 1 to 2 packs/day for  25 years. - Son had DVT.  Son is also a carrier for factor V Leiden.  Mother had bladder cancer and uterine cancer.   3.  Left leg DVT: - Patient had a left leg DVT approximately 2 years ago.  His wife thinks that DVT has happened about 4 to 6 weeks after his second COVID infection.  He was very minimally symptomatic from the COVID infection.  At the time of the DVT, he was completely mobile and did not have any surgery. - Patient was heterozygous for factor V Leiden as checked by Dr. [name] on 09/20/2011. - I have recommended that he get the records of timing of DVT and the COVID test.  If it is within 4 to 6 weeks of his COVID diagnosis, we may safely discontinue anticoagulation considering it can be provoked by COVID infection.  If it is unprovoked DVT, may continue low intensity anticoagulation.   4.  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 - Adjuvant XRT 50 Gray, completed 08/11/2015.    Plan: 1.  Stage II (T3N0) sigmoid colon cancer: - He underwent microwave thermal ablation of the right lobe hepatic mass on 10/10/2023. - He is feeling better with improvement in energy levels.  However he has reported falls few times since last visit.  He reports that he gets lightheaded when he turns suddenly and loses balance.  He was told to use a cane when he walks at home. - Labs today: LFTs improved with normal AST and ALT.  Alk phos is 153 and improving.  CBC also shows improved hemoglobin. - Recommend follow-up in 2 months.  I  will repeat MRI of the liver with and without contrast.  Will also repeat CT CAP to follow-up on right lung nodule.  Will also check CEA at that time.    2.  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  Hemoglobin today improved to 11.2.  Ferritin was 168 and saturation 33 on 10/08/2023.   3.  Left leg DVT: - Continue Eliquis daily.  No bleeding issues.  4.   Low back pain: - He has left-sided low back pain.  Continue tramadol as needed.    Orders Placed This Encounter  Procedures   CT CHEST ABDOMEN PELVIS W CONTRAST    Standing Status:   Future    Expected Date:   01/14/2024    Expiration Date:   11/13/2024    If indicated for the ordered procedure, I authorize the administration of contrast media per Radiology protocol:   Yes    Does the patient have a contrast media/X-ray dye allergy?:   No    Preferred imaging location?:   [facility]    Release to patient:   Immediate    If indicated for the ordered procedure, I authorize the administration of oral contrast media per Radiology protocol:   Yes   MR LIVER W WO CONTRAST    Standing Status:   Future    Expiration Date:   11/13/2024    If indicated for the ordered procedure, I authorize the administration of contrast media per Radiology protocol:   Yes    What is the patient's sedation requirement?:   No Sedation    Does the patient have a pacemaker or implanted devices?:   No    Preferred imaging location?:   [facility] (table limit - 550lbs)   CBC with Differential    Standing Status:   Future    Expected Date:   01/14/2024    Expiration Date:   11/13/2024   Comprehensive metabolic panel    Standing Status:   Future    Expected Date:   01/14/2024    Expiration Date:   11/13/2024   CEA    Standing Status:   Future    Expected Date:   01/14/2024    Expiration Date:   11/13/2024      Hurman Maiden R Teague,acting as a scribe for Paulett Boros, MD.,have documented all relevant documentation on the behalf of Paulett Boros, MD,as directed by  Paulett Boros, MD while in the presence of Paulett Boros, MD.  I, Paulett Boros MD, have reviewed the above documentation for accuracy and completeness, and I agree with the above.       Paulett Boros, MD   4/16/20253:39 PM  CHIEF COMPLAINT:   Diagnosis: sigmoid colon cancer and iron deficiency  anemia    Cancer Staging  Cancer of sigmoid colon [facility]) Staging form: Colon and Rectum, AJCC 8th Edition - Clinical stage from 01/25/2022: Stage IIA (cT3, cN0, cM0) - Signed by Paulett Boros, MD on 03/16/2022    Prior Therapy: 1. Left hemicolectomy on 02/17/2022  2. Microwave ablation of liver metastasis, 12/27/22  Current Therapy:  Surveillance    HISTORY OF PRESENT ILLNESS:   Oncology History  Cancer of sigmoid colon (HCC)  01/25/2022 Initial Diagnosis   Cancer of sigmoid colon (HCC)   01/25/2022 Cancer Staging   Staging form: Colon and Rectum, AJCC 8th Edition - Clinical stage from 01/25/2022: Stage IIA (cT3, cN0, cM0) - Signed by Paulett Boros, MD on 03/16/2022 Histopathologic type: Adenocarcinoma, NOS  Stage prefix: Initial diagnosis Total positive nodes: 0 Total nodes examined: 21 Histologic grade (G): G2 Histologic grading system: 4 grade system Microsatellite instability (MSI): Stable      INTERVAL HISTORY:   Herbert Wood is a 89 y.o. male presenting to clinic today for follow up of sigmoid colon cancer and iron deficiency anemia. He was last seen by me on 10/17/23.  Today, he states that he is doing well overall. His appetite level is at 75%. His energy level is at 75%.   PAST MEDICAL HISTORY:   Past Medical History: Past Medical History:  Diagnosis Date   Adenomatous colon polyp    Anemia    Carotid bruit    Chronic dyspnea    Diabetes (HCC)    type 2   Diverticular disease    DVT (deep venous thrombosis) (HCC)    lower limb   Dyspnea    Factor V deficiency (HCC)    GERD (gastroesophageal reflux disease)    Hearing loss    bilateral   Hyperlipemia    Hypertension    Inguinal hernia    Kidney disease    stage 3   Lung nodule    Lymphadenopathy    Malignant neoplasm (HCC)    salivary gland   Murmur    Osteoarthritis    Senile purpura (HCC)     Surgical History: Past Surgical History:  Procedure Laterality Date   BIOPSY  01/10/2022    Procedure: BIOPSY;  Surgeon: Urban Garden, MD;  [facility];  Service: Gastroenterology;;   COLON SURGERY     COLONOSCOPY WITH PROPOFOL N/A 01/10/2022   Procedure: COLONOSCOPY WITH PROPOFOL;  Surgeon: Urban Garden, MD;  [facility]: [facility];  Service: Gastroenterology;  Laterality: N/A;  per Hugo Maes, pt to arrive at 9:15   COLONOSCOPY WITH PROPOFOL N/A 02/27/2023   Procedure: COLONOSCOPY WITH PROPOFOL;  Surgeon: Urban Garden, MD;  [facility]: [facility];  Service: Gastroenterology;  Laterality: N/A;  9:15am;asa 3   ENDOSCOPIC RETROGRADE CHOLANGIOPANCREATOGRAPHY (ERCP) WITH PROPOFOL N/A 09/20/2023   Procedure: ENDOSCOPIC RETROGRADE CHOLANGIOPANCREATOGRAPHY (ERCP) WITH PROPOFOL;  Surgeon: Lajuan Pila, MD;  Location: [facility] ENDOSCOPY;  Service: Gastroenterology;  Laterality: N/A;   ESOPHAGOGASTRODUODENOSCOPY (EGD) WITH PROPOFOL N/A 01/10/2022   Procedure: ESOPHAGOGASTRODUODENOSCOPY (EGD) WITH PROPOFOL;  Surgeon: Urban Garden, MD;  [facility]: [facility];  Service: Gastroenterology;  Laterality: N/A;   HAND / FINGER LESION EXCISION     HERNIA REPAIR     IR RADIOLOGIST EVAL & MGMT  04/04/2023   IR RADIOLOGIST EVAL & MGMT  08/09/2023   MASTOIDECTOMY     PILONIDAL CYST EXCISION     POLYPECTOMY  01/10/2022   Procedure: POLYPECTOMY;  Surgeon: Urban Garden, MD;  [facility]: [facility];  Service: Gastroenterology;;   POLYPECTOMY  02/27/2023   Procedure: POLYPECTOMY;  Surgeon: Urban Garden, MD;  [facility]: [facility];  Service: Gastroenterology;;   RADIOLOGY WITH ANESTHESIA N/A 12/27/2022   Procedure: CT microwave ablation of the liver;  Surgeon: Erica Hau, MD;  Location: [facility];  Service: Radiology;  Laterality: N/A;   RADIOLOGY WITH ANESTHESIA N/A 10/10/2023   Procedure: CT WITH ANESTHESIA MICROWAVE ABLATION OF LIVER;  Surgeon: Erica Hau, MD;  Location: [facility];  Service: Radiology;  Laterality:  N/A;   REMOVAL OF STONES  09/20/2023   Procedure: REMOVAL OF STONES;  Surgeon: Lajuan Pila, MD;  Location: [facility] ENDOSCOPY;  Service: Gastroenterology;;   [address]  09/20/2023   Procedure: SPHINCTEROTOMY;  Surgeon:  Lajuan Pila, MD;  Location: [facility] ENDOSCOPY;  Service: Gastroenterology;;   [address]  09/20/2023   Procedure: Freida Jes;  Surgeon: Lajuan Pila, MD;  Location: [facility] ENDOSCOPY;  Service: Gastroenterology;;   SUBMUCOSAL TATTOO INJECTION  01/10/2022   Procedure: SUBMUCOSAL TATTOO INJECTION;  Surgeon: Umberto Ganong, Bearl Limes, MD;  [facility]: [facility];  Service: Gastroenterology;;   SURGERY OF LIP     skin cancer    Social History: Social History   Socioeconomic History   Marital status: Married    Spouse name: Not on file   Number of children: Not on file   Years of education: Not on file   Highest education level: Not on file  Occupational History   Not on file  Tobacco Use   Smoking status: Former    Current packs/day: 0.00    Average packs/day: 2.0 packs/day for 25.0 years (50.0 ttl pk-yrs)    Types: Cigarettes    Start date: 22    Quit date: 76    Years since quitting: 30.3    Passive exposure: Past   Smokeless tobacco: Never  Vaping Use   Vaping status: Never Used  Substance and Sexual Activity   Alcohol use: Never   Drug use: Never   Sexual activity: Not on file  Other Topics Concern   Not on file  Social History Narrative   Not on file   Social Drivers of Health   Financial Resource Strain: Not on file  Food Insecurity: No Food Insecurity (10/10/2023)   Hunger Vital Sign    Worried About Running Out of Food in the Last Year: Never true    Ran Out of Food in the Last Year: Never true  Transportation Needs: No Transportation Needs (10/10/2023)   PRAPARE - Administrator, Civil Service (Medical): No    Lack of Transportation (Non-Medical): No  Physical Activity: Not on file  Stress: Not on file  Social Connections:  Moderately Integrated (10/10/2023)   Social Connection and Isolation Panel [NHANES]    Frequency of Communication with Friends and Family: More than three times a week    Frequency of Social Gatherings with Friends and Family: Once a week    Attends Religious Services: More than 4 times per year    Active Member of [employer] or Organizations: No    Attends Banker Meetings: Never    Marital Status: Married  Catering manager Violence: Not At Risk (10/10/2023)   Humiliation, Afraid, Rape, and Kick questionnaire    Fear of Current or Ex-Partner: No    Emotionally Abused: No    Physically Abused: No    Sexually Abused: No    Family History: No family history on file.  Current Medications:  Current Outpatient Medications:    amitriptyline (ELAVIL) 10 MG tablet, Take 10 mg by mouth every evening., Disp: , Rfl:    amLODipine (NORVASC) 5 MG tablet, Take 5 mg by mouth 2 (two) times daily., Disp: , Rfl:    ascorbic acid (VITAMIN C/NATURAL ROSE HIPS) 1000 MG tablet, Take 1,000 mg by mouth every evening., Disp: , Rfl:    atorvastatin (LIPITOR) 40 MG tablet, Take 40 mg by mouth every evening., Disp: , Rfl:    carvedilol (COREG) 6.25 MG tablet, Take 6.25 mg by mouth 2 (two) times daily with a meal., Disp: , Rfl:    Cholecalciferol (VITAMIN D3) 125 MCG (5000 UT) CAPS, Take 5,000 Units by mouth every evening., Disp: , Rfl:    ELIQUIS 5 MG TABS  tablet, TAKE 1 TABLET(5 MG) BY MOUTH TWICE DAILY, Disp: 60 tablet, Rfl: 3   ferrous sulfate 325 (65 FE) MG tablet, Take 325 mg by mouth daily with breakfast., Disp: , Rfl:    folic acid (FOLVITE) 1 MG tablet, Take 1 tablet (1 mg total) by mouth every evening., Disp: 30 tablet, Rfl: 0   hydrALAZINE (APRESOLINE) 25 MG tablet, Take 50 mg by mouth in the morning and at bedtime., Disp: , Rfl:    KERENDIA 10 MG TABS, Take 10 mg by mouth daily., Disp: , Rfl:    latanoprost (XALATAN) 0.005 % ophthalmic solution, Place 1 drop into both eyes at bedtime., Disp: ,  Rfl:    linagliptin (TRADJENTA) 5 MG TABS tablet, Take 5 mg by mouth in the morning., Disp: , Rfl:    loratadine (CLARITIN) 10 MG tablet, Take 10 mg by mouth every morning., Disp: , Rfl:    Misc Natural Products (PROSTATE SUPPORT PO), Take 1 capsule by mouth every evening. NOW Prostate Support, Disp: , Rfl:    omeprazole (PRILOSEC) 40 MG capsule, Take 1 capsule (40 mg total) by mouth daily., Disp: 90 capsule, Rfl: 3   predniSONE (DELTASONE) 50 MG tablet, Take one tablet by mouth 13 hours, 7 hours, and 1 hour prior to MRI, Disp: 3 tablet, Rfl: 0   tobramycin (TOBREX) 0.3 % ophthalmic solution, INSTILL 5 DROPS TO RIGHT EAR TWICE DAILY FOR 7 DAYS, Disp: , Rfl:    traMADol (ULTRAM) 50 MG tablet, Take 1 tablet (50 mg total) by mouth at bedtime as needed., Disp: 30 tablet, Rfl: 0   vitamin B-12 (CYANOCOBALAMIN) 1000 MCG tablet, Take 1,000 mcg by mouth every evening. Now (Brand), Disp: , Rfl:    dorzolamide-timolol (COSOPT) 2-0.5 % ophthalmic solution, Place 1 drop into both eyes 2 (two) times daily., Disp: , Rfl:    ondansetron (ZOFRAN) 4 MG tablet, Take 1 tablet (4 mg total) by mouth every 6 (six) hours as needed for nausea. (Patient not taking: Reported on 11/14/2023), Disp: 20 tablet, Rfl: 0   ondansetron (ZOFRAN-ODT) 4 MG disintegrating tablet, 4mg  ODT q4 hours prn nausea/vomit (Patient not taking: Reported on 11/14/2023), Disp: 10 tablet, Rfl: 0   Allergies: Allergies  Allergen Reactions   Gadobenate Nausea And Vomiting    Immediately upon the infusion of 15mL multihance contrast  Patient had exteme nausea and vomiting.   No other symptoms noted .  No injury.  MRI scan was completed after patient felt better.   Immediately upon the infusion of 15mL multihance contrast  Patient had exteme nausea and vomiting.   No other symptoms noted .  No injury.  MRI scan was completed after patient felt better.       REVIEW OF SYSTEMS:   Review of Systems  Constitutional:  Negative for chills, fatigue and  fever.  HENT:   Negative for lump/mass, mouth sores, nosebleeds, sore throat and trouble swallowing.   Eyes:  Negative for eye problems.  Respiratory:  Positive for shortness of breath. Negative for cough.   Cardiovascular:  Negative for chest pain, leg swelling and palpitations.  Gastrointestinal:  Negative for abdominal pain, constipation, diarrhea, nausea and vomiting.  Genitourinary:  Negative for bladder incontinence, difficulty urinating, dysuria, frequency, hematuria and nocturia.   Musculoskeletal:  Negative for arthralgias, back pain, flank pain, myalgias and neck pain.  Skin:  Negative for itching and rash.  Neurological:  Positive for dizziness and numbness. Negative for headaches.  Hematological:  Does not bruise/bleed easily.  Psychiatric/Behavioral:  Negative for depression, sleep disturbance and suicidal ideas. The patient is not nervous/anxious.   All other systems reviewed and are negative.    VITALS:   Blood pressure (!) 135/56, pulse (!) 51, temperature 97.8 F (36.6 C), temperature source Tympanic, resp. rate 17, height 5\' 10"  (1.778 m), weight 191 lb (86.6 kg), SpO2 96%.  Wt Readings from Last 3 Encounters:  11/14/23 191 lb (86.6 kg)  10/17/23 194 lb 10.7 oz (88.3 kg)  10/10/23 200 lb (90.7 kg)    Body mass index is 27.41 kg/m.  Performance status (ECOG): 1 - Symptomatic but completely ambulatory  PHYSICAL EXAM:   Physical Exam Vitals and nursing note reviewed. Exam conducted with a chaperone present.  Constitutional:      Appearance: Normal appearance.  Cardiovascular:     Rate and Rhythm: Normal rate and regular rhythm.     Pulses: Normal pulses.     Heart sounds: Normal heart sounds.  Pulmonary:     Effort: Pulmonary effort is normal.     Breath sounds: Normal breath sounds.  Abdominal:     Palpations: Abdomen is soft. There is no hepatomegaly, splenomegaly or mass.     Tenderness: There is no abdominal tenderness.  Musculoskeletal:     Right  lower leg: No edema.     Left lower leg: No edema.  Lymphadenopathy:     Cervical: No cervical adenopathy.     Right cervical: No superficial, deep or posterior cervical adenopathy.    Left cervical: No superficial, deep or posterior cervical adenopathy.     Upper Body:     Right upper body: No supraclavicular or axillary adenopathy.     Left upper body: No supraclavicular or axillary adenopathy.  Neurological:     General: No focal deficit present.     Mental Status: He is alert and oriented to person, place, and time.  Psychiatric:        Mood and Affect: Mood normal.        Behavior: Behavior normal.     LABS:      Latest Ref Rng & Units 11/14/2023    1:17 PM 10/11/2023    5:22 AM 10/08/2023    9:05 AM  CBC  WBC 4.0 - 10.5 K/uL 7.8  7.8  7.5   Hemoglobin 13.0 - 17.0 g/dL 16.1  09.6  04.5   Hematocrit 39.0 - 52.0 % 34.0  32.5  34.2   Platelets 150 - 400 K/uL 181  151  180       Latest Ref Rng & Units 11/14/2023    1:17 PM 10/17/2023    2:38 PM 10/10/2023    7:22 AM  CMP  Glucose 70 - 99 mg/dL 409   811   BUN 8 - 23 mg/dL 26   28   Creatinine 9.14 - 1.24 mg/dL 7.82   9.56   Sodium 213 - 145 mmol/L 140   139   Potassium 3.5 - 5.1 mmol/L 3.9   3.8   Chloride 98 - 111 mmol/L 105   108   CO2 22 - 32 mmol/L 23   21   Calcium 8.9 - 10.3 mg/dL 9.1   9.1   Total Protein 6.5 - 8.1 g/dL 6.8  6.7  6.5   Total Bilirubin 0.0 - 1.2 mg/dL 1.2  1.0  2.0   Alkaline Phos 38 - 126 U/L 153  219  167   AST 15 - 41 U/L 36  61  37   ALT  0 - 44 U/L 39  65  42      Lab Results  Component Value Date   CEA1 94.8 (H) 10/08/2023   /  CEA  Date Value Ref Range Status  10/08/2023 94.8 (H) 0.0 - 4.7 ng/mL Final    Comment:    (NOTE)                             Nonsmokers          <3.9                             Smokers             <5.6 Roche [employer]ostics Electrochemiluminescence Immunoassay (ECLIA) Values obtained with different assay methods or kits cannot be used interchangeably.   Results cannot be interpreted as absolute evidence of the presence or absence of malignant disease. Performed At: [facility] [address] [place], [place] [id number] Pearlean Botts MD OZ:[id number]    No results found for: "PSA1" No results found for: "CAN199" No results found for: "CAN125"  Lab Results  Component Value Date   TOTALPROTELP 6.3 01/25/2022   ALBUMINELP 3.6 01/25/2022   A1GS 0.2 01/25/2022   A2GS 0.7 01/25/2022   BETS 0.9 01/25/2022   GAMS 0.9 01/25/2022   MSPIKE Not Observed 01/25/2022   SPEI Comment 01/25/2022   Lab Results  Component Value Date   TIBC 322 10/08/2023   TIBC 290 08/02/2023   TIBC 327 06/27/2023   FERRITIN 168 10/08/2023   FERRITIN 142 08/02/2023   FERRITIN 87 06/27/2023   IRONPCTSAT 33 10/08/2023   IRONPCTSAT 31 08/02/2023   IRONPCTSAT 43 (H) 06/27/2023   No results found for: "LDH"   STUDIES:   No results found.

## 2023-11-14 NOTE — Patient Instructions (Addendum)
 [facility] at [facility] Discharge Instructions   You were seen and examined today by Dr. Cheree Cords.  He reviewed the results of your lab work which are normal/stable. Your liver enzymes have improved. Your hemoglobin is also improved.   We will see you back in 2 months. We will repeat lab work, CT scan, and an MRI of the liver prior to this visit.    Return as scheduled.    Thank you for choosing [facility] at [facility] to provide your oncology and hematology care.  To afford each patient quality time with our provider, please arrive at least 15 minutes before your scheduled appointment time.   If you have a lab appointment with the Cancer Center please come in thru the Main Entrance and check in at the main information desk.  You need to re-schedule your appointment should you arrive 10 or more minutes late.  We strive to give you quality time with our providers, and arriving late affects you and other patients whose appointments are after yours.  Also, if you no show three or more times for appointments you may be dismissed from the clinic at the providers discretion.     Again, thank you for choosing [facility].  Our hope is that these requests will decrease the amount of time that you wait before being seen by our physicians.       _____________________________________________________________  Should you have questions after your visit to [facility], please contact our office at [phone] and follow the prompts.  Our office hours are 8:00 a.m. and 4:30 p.m. Monday - Friday.  Please note that voicemails left after 4:00 p.m. may not be returned until the following business day.  We are closed weekends and major holidays.  You do have access to a nurse 24-7, just call the main number to the clinic [phone] and do not press any options, hold on the line and a nurse will answer the phone.    For prescription  refill requests, have your pharmacy contact our office and allow 72 hours.    Due to Covid, you will need to wear a mask upon entering the hospital. If you do not have a mask, a mask will be given to you at the Main Entrance upon arrival. For doctor visits, patients may have 1 support person age 51 or older with them. For treatment visits, patients can not have anyone with them due to social distancing guidelines and our immunocompromised population.

## 2024-01-11 ENCOUNTER — Ambulatory Visit (HOSPITAL_COMMUNITY)
Admission: RE | Admit: 2024-01-11 | Discharge: 2024-01-11 | Disposition: A | Discharge status: Home / Self Care | Source: Ambulatory Visit | Attending: Hematology | Admitting: Hematology

## 2024-01-11 ENCOUNTER — Inpatient Hospital Stay: Attending: Hematology

## 2024-01-11 DIAGNOSIS — N189 Chronic kidney disease, unspecified: Secondary | ICD-10-CM | POA: Insufficient documentation

## 2024-01-11 DIAGNOSIS — C787 Secondary malignant neoplasm of liver and intrahepatic bile duct: Secondary | ICD-10-CM | POA: Insufficient documentation

## 2024-01-11 DIAGNOSIS — I129 Hypertensive chronic kidney disease with stage 1 through stage 4 chronic kidney disease, or unspecified chronic kidney disease: Secondary | ICD-10-CM | POA: Insufficient documentation

## 2024-01-11 DIAGNOSIS — K769 Liver disease, unspecified: Secondary | ICD-10-CM

## 2024-01-11 DIAGNOSIS — E1122 Type 2 diabetes mellitus with diabetic chronic kidney disease: Secondary | ICD-10-CM | POA: Diagnosis not present

## 2024-01-11 DIAGNOSIS — D509 Iron deficiency anemia, unspecified: Secondary | ICD-10-CM | POA: Insufficient documentation

## 2024-01-11 DIAGNOSIS — D631 Anemia in chronic kidney disease: Secondary | ICD-10-CM | POA: Diagnosis not present

## 2024-01-11 DIAGNOSIS — C187 Malignant neoplasm of sigmoid colon: Secondary | ICD-10-CM

## 2024-01-11 LAB — CBC WITH DIFFERENTIAL/PLATELET
Abs Immature Granulocytes: 0.02 10*3/uL (ref 0.00–0.07)
Basophils Absolute: 0 10*3/uL (ref 0.0–0.1)
Basophils Relative: 0 %
Eosinophils Absolute: 0 10*3/uL (ref 0.0–0.5)
Eosinophils Relative: 0 %
HCT: 34.5 % — ABNORMAL LOW (ref 39.0–52.0)
Hemoglobin: 11.8 g/dL — ABNORMAL LOW (ref 13.0–17.0)
Immature Granulocytes: 0 %
Lymphocytes Relative: 9 %
Lymphs Abs: 0.5 10*3/uL — ABNORMAL LOW (ref 0.7–4.0)
MCH: 33.3 pg (ref 26.0–34.0)
MCHC: 34.2 g/dL (ref 30.0–36.0)
MCV: 97.5 fL (ref 80.0–100.0)
Monocytes Absolute: 0.1 10*3/uL (ref 0.1–1.0)
Monocytes Relative: 1 %
Neutro Abs: 5.1 10*3/uL (ref 1.7–7.7)
Neutrophils Relative %: 90 %
Platelets: 171 10*3/uL (ref 150–400)
RBC: 3.54 MIL/uL — ABNORMAL LOW (ref 4.22–5.81)
RDW: 13.7 % (ref 11.5–15.5)
WBC: 5.7 10*3/uL (ref 4.0–10.5)
nRBC: 0 % (ref 0.0–0.2)

## 2024-01-11 LAB — COMPREHENSIVE METABOLIC PANEL WITH GFR
ALT: 68 U/L — ABNORMAL HIGH (ref 0–44)
AST: 60 U/L — ABNORMAL HIGH (ref 15–41)
Albumin: 3.6 g/dL (ref 3.5–5.0)
Alkaline Phosphatase: 158 U/L — ABNORMAL HIGH (ref 38–126)
Anion gap: 11 (ref 5–15)
BUN: 39 mg/dL — ABNORMAL HIGH (ref 8–23)
CO2: 19 mmol/L — ABNORMAL LOW (ref 22–32)
Calcium: 9 mg/dL (ref 8.9–10.3)
Chloride: 106 mmol/L (ref 98–111)
Creatinine, Ser: 1.48 mg/dL — ABNORMAL HIGH (ref 0.61–1.24)
GFR, Estimated: 46 mL/min — ABNORMAL LOW (ref >60)
Glucose, Bld: 183 mg/dL — ABNORMAL HIGH (ref 70–99)
Potassium: 4.4 mmol/L (ref 3.5–5.1)
Sodium: 136 mmol/L (ref 135–145)
Total Bilirubin: 1.2 mg/dL (ref 0.0–1.2)
Total Protein: 7.1 g/dL (ref 6.5–8.1)

## 2024-01-11 MED ORDER — IOHEXOL 300 MG/ML  SOLN
80.0000 mL | Freq: Once | INTRAMUSCULAR | Status: AC | PRN
Start: 2024-01-11 — End: 2024-01-11
  Administered 2024-01-11: 80 mL via INTRAVENOUS

## 2024-01-11 MED ORDER — IOHEXOL 9 MG/ML PO SOLN
ORAL | Status: AC
Start: 2024-01-11 — End: 2024-01-11
  Filled 2024-01-11: qty 1000

## 2024-01-11 MED ORDER — GADOBUTROL 1 MMOL/ML IV SOLN
9.0000 mL | Freq: Once | INTRAVENOUS | Status: AC | PRN
Start: 2024-01-11 — End: 2024-01-11
  Administered 2024-01-11: 9 mL via INTRAVENOUS

## 2024-01-12 LAB — CEA: CEA: 36.9 ng/mL — ABNORMAL HIGH (ref 0.0–4.7)

## 2024-01-16 NOTE — Progress Notes (Signed)
 [facility] [address] [place] [address]    Clinic Day:  01/17/2024  Referring physician: Meade Bigness, MD  Patient Care Team: Meade Bigness, MD as PCP - General (Internal Medicine) Celestia Joesph SQUIBB, RN as Oncology Nurse Navigator (Medical Oncology) Rogers Hai, MD as Medical Oncologist (Medical Oncology)   ASSESSMENT & PLAN:   Assessment: 1. Stage II (T3N0) sigmoid colon cancer, MMR preserved: - Anemia with hemoglobin of 8, recent admission with diverticulitis.  He also had weight loss of 40 pounds in the last 6 months. - Colonoscopy by Dr. Eartha (01/10/2022): Fungating and ulcerated partially obstructing mass found at 42 cm proximal to the anus.  Mass is circumferential and measured 3 cm in length. - Pathology: Invasive moderately differentiated adenocarcinoma of the descending colon mass.  Transverse colon polypectomy shows fragments of at least intramucosal adenocarcinoma and high-grade dysplasia. - CT CAP (01/13/2022): No evidence of metastatic disease in the chest, abdomen or pelvis.  Several tiny lung nodules and scattered areas of interstitial irregularity in the right upper lobe likely postinflammatory. - Preoperative CEA (01/10/2022): 7.9. - Left hemicolectomy (02/17/2022): Moderately differentiated grade 2 adenocarcinoma, no perforation, no lymphovascular/perineural invasion, margins negative.  0/21 lymph nodes involved.  PT3 pN0. - PET scan on 11/09/2022: 3.2 x 2.3 cm right hepatic lobe metastasis. - MRI of the liver on 11/22/2022: Isolated inferior right hepatic lobe 2.8 cm. - Right lobe lesion thermal ablation on 12/27/2022. - Percutaneous microwave thermal ablation of the right lobe liver mass on 10/10/2023   2. Social/family history: - Lives at home with his wife.  Wife works at [employer] nephrology office in [place].  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  Quit smoking in 1995.  Smoked 1 to 2 packs/day for  25 years. - Son had DVT.  Son is also a carrier for factor V Leiden.  Mother had bladder cancer and uterine cancer.   3.  Left leg DVT: - Patient had a left leg DVT approximately 2 years ago.  His wife thinks that DVT has happened about 4 to 6 weeks after his second COVID infection.  He was very minimally symptomatic from the COVID infection.  At the time of the DVT, he was completely mobile and did not have any surgery. - Patient was heterozygous for factor V Leiden as checked by Dr. Devonda on 09/20/2011. - I have recommended that he get the records of timing of DVT and the COVID test.  If it is within 4 to 6 weeks of his COVID diagnosis, we may safely discontinue anticoagulation considering it can be provoked by COVID infection.  If it is unprovoked DVT, may continue low intensity anticoagulation.   4.  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 - Adjuvant XRT 50 Gray, completed 08/11/2015.    Plan: 1.  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 - His energy levels are back to baseline. - Reviewed labs from 01/11/2024: AST and ALT are minimally elevated.  Creatinine is 1.48.  CBC grossly normal.  CEA is elevated at 36.9 although decreased from 93. - MRI liver from 01/11/2024: Ablation defect with residual or recurrent rim-enhancing tumor at the superior margin of the ablation site measuring 4.1 x 3.6 x 1.8 cm. - CT CAP on 01/11/2024: Numerous new and enlarging bilateral lung nodules, largest  measuring 1.4 cm in the right lower lobe. - Recommend sending [employer].  We will do PET scan to identify area to biopsy on the liver.  We will send for NGS testing. - RTC after PET scan/biopsy.    2.  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  Hemoglobin today is 11.8.   3.  Left leg DVT: - Continue  Eliquis  daily.  No bleeding issues.  4.  Low back pain: - He has left-sided low back pain which is improving.  Continue tramadol  as needed.    Orders Placed This Encounter  Procedures   NM PET Image Restage (PS) Skull Base to Thigh (F-18 FDG)    Standing Status:   Future    Expected Date:   01/24/2024    Expiration Date:   01/16/2025    If indicated for the ordered procedure, I authorize the administration of a radiopharmaceutical per Radiology protocol:   Yes    Preferred imaging location?:   [facility]      I,Helena R Teague,acting as a scribe for Alean Stands, MD.,have documented all relevant documentation on the behalf of Alean Stands, MD,as directed by  Alean Stands, MD while in the presence of Alean Stands, MD.  I, Alean Stands MD, have reviewed the above documentation for accuracy and completeness, and I agree with the above.       Alean Stands, MD   6/19/20255:02 PM  CHIEF COMPLAINT:   Diagnosis: sigmoid colon cancer and iron deficiency anemia    Cancer Staging  Cancer of sigmoid colon [facility]) Staging form: Colon and Rectum, AJCC 8th Edition - Clinical stage from 01/25/2022: Stage IIA (cT3, cN0, cM0) - Signed by Stands Alean, MD on 03/16/2022 - Pathologic stage from 01/17/2024: Stage IVB (rpTX, rpNX, rcM1b) - Signed by Stands Alean, MD on 01/17/2024    Prior Therapy: 1. Left hemicolectomy on 02/17/2022  2. Microwave ablation of liver metastasis, 12/27/22  Current Therapy:  Surveillance    HISTORY OF PRESENT ILLNESS:   Oncology History  Cancer of sigmoid colon (HCC)  01/25/2022 Initial Diagnosis   Cancer of sigmoid colon (HCC)   01/25/2022 Cancer Staging   Staging form: Colon and Rectum, AJCC 8th Edition - Clinical stage from 01/25/2022: Stage IIA (cT3, cN0, cM0) - Signed by Stands Alean, MD on 03/16/2022 Histopathologic type: Adenocarcinoma, NOS Stage prefix: Initial diagnosis Total positive nodes:  0 Total nodes examined: 21 Histologic grade (G): G2 Histologic grading system: 4 grade system Microsatellite instability (MSI): Stable   01/17/2024 Cancer Staging   Staging form: Colon and Rectum, AJCC 8th Edition - Pathologic stage from 01/17/2024: Stage IVB (rpTX, rpNX, rcM1b) - Signed by Stands Alean, MD on 01/17/2024 Stage prefix: Recurrence      INTERVAL HISTORY:   Herbert Wood is a 89 y.o. male presenting to clinic today for follow up of sigmoid colon cancer and iron deficiency anemia. He was last seen by me on 11/14/23.  Since his last visit, he underwent CT CAP on 01/11/24 that found: Numerous new and enlarged bilateral pulmonary nodules, consistent with worsened pulmonary metastatic disease. Expected appearance of ablation site of hepatic segment VI with overlying capsular retraction. No new liver lesions appreciated by CT. Status post partial transverse and descending hemicolectomy. Severe pancolonic diverticulosis. Coronary artery disease.  MRI liver on 01/11/24 showed: Interval ablation of a metastasis of hepatic segment VI. Residual or recurrent rim enhancing tumor at the superior margin of the ablation site measuring 4.1 x 3.6 x 1.8 cm. Pulmonary nodules  in the included lung bases, consistent with metastatic disease and better assessed by separately reported same day CT examination. Colonic diverticulosis.  Today, he states that he is doing well overall. His appetite level is at 100%. His energy level is at 50%.   PAST MEDICAL HISTORY:   Past Medical History: Past Medical History:  Diagnosis Date   Adenomatous colon polyp    Anemia    Carotid bruit    Chronic dyspnea    Diabetes (HCC)    type 2   Diverticular disease    DVT (deep venous thrombosis) (HCC)    lower limb   Dyspnea    Factor V deficiency (HCC)    GERD (gastroesophageal reflux disease)    Hearing loss    bilateral   Hyperlipemia    Hypertension    Inguinal hernia    Kidney disease    stage 3   Lung  nodule    Lymphadenopathy    Malignant neoplasm (HCC)    salivary gland   Murmur    Osteoarthritis    Senile purpura (HCC)     Surgical History: Past Surgical History:  Procedure Laterality Date   BIOPSY  01/10/2022   Procedure: BIOPSY;  Surgeon: Eartha Angelia Sieving, MD;  Location: [facility];  Service: Gastroenterology;;   COLON SURGERY     COLONOSCOPY WITH PROPOFOL  N/A 01/10/2022   Procedure: COLONOSCOPY WITH PROPOFOL ;  Surgeon: Eartha Angelia Sieving, MD;  Location: [facility];  Service: Gastroenterology;  Laterality: N/A;  per Anette Caldron, pt to arrive at 9:15   COLONOSCOPY WITH PROPOFOL  N/A 02/27/2023   Procedure: COLONOSCOPY WITH PROPOFOL ;  Surgeon: Eartha Angelia Sieving, MD;  Location: [facility];  Service: Gastroenterology;  Laterality: N/A;  9:15am;asa 3   ENDOSCOPIC RETROGRADE CHOLANGIOPANCREATOGRAPHY (ERCP) WITH PROPOFOL  N/A 09/20/2023   Procedure: ENDOSCOPIC RETROGRADE CHOLANGIOPANCREATOGRAPHY (ERCP) WITH PROPOFOL ;  Surgeon: Charlanne Groom, MD;  Location: [facility] ENDOSCOPY;  Service: Gastroenterology;  Laterality: N/A;   ESOPHAGOGASTRODUODENOSCOPY (EGD) WITH PROPOFOL  N/A 01/10/2022   Procedure: ESOPHAGOGASTRODUODENOSCOPY (EGD) WITH PROPOFOL ;  Surgeon: Eartha Angelia Sieving, MD;  [facility];  Service: Gastroenterology;  Laterality: N/A;   HAND / FINGER LESION EXCISION     HERNIA REPAIR     IR RADIOLOGIST EVAL & MGMT  04/04/2023   IR RADIOLOGIST EVAL & MGMT  08/09/2023   MASTOIDECTOMY     PILONIDAL CYST EXCISION     POLYPECTOMY  01/10/2022   Procedure: POLYPECTOMY;  Surgeon: Eartha Angelia Sieving, MD;  [facility]: [facility];  Service: Gastroenterology;;   POLYPECTOMY  02/27/2023   Procedure: POLYPECTOMY;  Surgeon: Eartha Angelia Sieving, MD;  [facility]: [facility];  Service: Gastroenterology;;   RADIOLOGY WITH ANESTHESIA N/A 12/27/2022   Procedure: CT microwave ablation of the liver;  Surgeon: Luverne Aran, MD;  Location: [facility];   Service: Radiology;  Laterality: N/A;   RADIOLOGY WITH ANESTHESIA N/A 10/10/2023   Procedure: CT WITH ANESTHESIA MICROWAVE ABLATION OF LIVER;  Surgeon: Luverne Aran, MD;  Location: [facility];  Service: Radiology;  Laterality: N/A;   REMOVAL OF STONES  09/20/2023   Procedure: REMOVAL OF STONES;  Surgeon: Charlanne Groom, MD;  Location: [facility] ENDOSCOPY;  Service: Gastroenterology;;   ANNETT  09/20/2023   Procedure: ANNETT;  Surgeon: Charlanne Groom, MD;  Location: [facility] END[facility]COPY;  Service: Gastroenterology;;   ANNETT  09/20/2023   Procedure: OLEVIA;  Surgeon: Charlanne Groom, MD;  Location: [facility] ENDOSCOPY;  Service: Gastroenterology;;   SUBMUCOSAL TATTOO INJECTION  01/10/2022   Procedure: SUBMUCOSAL TATTOO  INJECTION;  Surgeon: Eartha Flavors, Toribio, MD;  Location: [facility];  Service: Gastroenterology;;   SURGERY OF LIP     skin cancer    Social History: Social History   Socioeconomic History   Marital status: Married    Spouse name: Not on file   Number of children: Not on file   Years of education: Not on file   Highest education level: Not on file  Occupational History   Not on file  Tobacco Use   Smoking status: Former    Current packs/day: 0.00    Average packs/day: 2.0 packs/day for 25.0 years (50.0 ttl pk-yrs)    Types: Cigarettes    Start date: 55    Quit date: 4    Years since quitting: 30.4    Passive exposure: Past   Smokeless tobacco: Never  Vaping Use   Vaping status: Never Used  Substance and Sexual Activity   Alcohol use: Never   Drug use: Never   Sexual activity: Not on file  Other Topics Concern   Not on file  Social History Narrative   Not on file   Social Drivers of Health   Financial Resource Strain: Not on file  Food Insecurity: No Food Insecurity (10/10/2023)   Hunger Vital Sign    Worried About Running Out of Food in the Last Year: Never true    Ran Out of Food in the Last Year: Never true  Transportation Needs:  No Transportation Needs (10/10/2023)   PRAPARE - Administrator, Civil Service (Medical): No    Lack of Transportation (Non-Medical): No  Physical Activity: Not on file  Stress: Not on file  Social Connections: Moderately Integrated (10/10/2023)   Social Connection and Isolation Panel    Frequency of Communication with Friends and Family: More than three times a week    Frequency of Social Gatherings with Friends and Family: Once a week    Attends Religious Services: More than 4 times per year    Active Member of [employer] or Organizations: No    Attends Banker Meetings: Never    Marital Status: Married  Catering manager Violence: Not At Risk (10/10/2023)   Humiliation, Afraid, Rape, and Kick questionnaire    Fear of Current or Ex-Partner: No    Emotionally Abused: No    Physically Abused: No    Sexually Abused: No    Family History: No family history on file.  Current Medications:  Current Outpatient Medications:    amitriptyline  (ELAVIL ) 10 MG tablet, Take 10 mg by mouth every evening., Disp: , Rfl:    amLODipine  (NORVASC ) 5 MG tablet, Take 5 mg by mouth 2 (two) times daily., Disp: , Rfl:    ascorbic acid  (VITAMIN C /NATURAL ROSE HIPS) 1000 MG tablet, Take 1,000 mg by mouth every evening., Disp: , Rfl:    atorvastatin  (LIPITOR) 40 MG tablet, Take 40 mg by mouth every evening., Disp: , Rfl:    carvedilol  (COREG ) 6.25 MG tablet, Take 6.25 mg by mouth 2 (two) times daily with a meal., Disp: , Rfl:    Cholecalciferol  (VITAMIN D3) 125 MCG (5000 UT) CAPS, Take 5,000 Units by mouth every evening., Disp: , Rfl:    dorzolamide -timolol (COSOPT) 2-0.5 % ophthalmic solution, Place 1 drop into both eyes 2 (two) times daily., Disp: , Rfl:    ELIQUIS  5 MG TABS tablet, TAKE 1 TABLET(5 MG) BY MOUTH TWICE DAILY, Disp: 60 tablet, Rfl: 3   ferrous sulfate  325 (65 FE) MG  tablet, Take 325 mg by mouth daily with breakfast., Disp: , Rfl:    folic acid  (FOLVITE ) 1 MG tablet, Take 1  tablet (1 mg total) by mouth every evening., Disp: 30 tablet, Rfl: 0   hydrALAZINE  (APRESOLINE ) 25 MG tablet, Take 50 mg by mouth in the morning and at bedtime., Disp: , Rfl:    KERENDIA  10 MG TABS, Take 10 mg by mouth daily., Disp: , Rfl:    latanoprost  (XALATAN ) 0.005 % ophthalmic solution, Place 1 drop into both eyes at bedtime., Disp: , Rfl:    linagliptin  (TRADJENTA ) 5 MG TABS tablet, Take 5 mg by mouth in the morning., Disp: , Rfl:    loratadine  (CLARITIN ) 10 MG tablet, Take 10 mg by mouth every morning., Disp: , Rfl:    Misc Natural Products (PROSTATE SUPPORT PO), Take 1 capsule by mouth every evening. NOW Prostate Support, Disp: , Rfl:    omeprazole  (PRILOSEC) 40 MG capsule, Take 1 capsule (40 mg total) by mouth daily., Disp: 90 capsule, Rfl: 3   ondansetron  (ZOFRAN ) 4 MG tablet, Take 1 tablet (4 mg total) by mouth every 6 (six) hours as needed for nausea., Disp: 20 tablet, Rfl: 0   ondansetron  (ZOFRAN -ODT) 4 MG disintegrating tablet, 4mg  ODT q4 hours prn nausea/vomit, Disp: 10 tablet, Rfl: 0   traMADol  (ULTRAM ) 50 MG tablet, Take 1 tablet (50 mg total) by mouth at bedtime as needed., Disp: 30 tablet, Rfl: 0   vitamin B-12 (CYANOCOBALAMIN ) 1000 MCG tablet, Take 1,000 mcg by mouth every evening. Now (Brand), Disp: , Rfl:    Allergies: Allergies  Allergen Reactions   Gadobenate Nausea And Vomiting    Immediately upon the infusion of 15mL multihance contrast  Patient had exteme nausea and vomiting.   No other symptoms noted .  No injury.  MRI scan was completed after patient felt better.   Immediately upon the infusion of 15mL multihance contrast  Patient had exteme nausea and vomiting.   No other symptoms noted .  No injury.  MRI scan was completed after patient felt better.       REVIEW OF SYSTEMS:   Review of Systems  Constitutional:  Negative for chills, fatigue and fever.  HENT:   Negative for lump/mass, mouth sores, nosebleeds, sore throat and trouble swallowing.   Eyes:   Negative for eye problems.  Respiratory:  Negative for cough and shortness of breath.   Cardiovascular:  Negative for chest pain, leg swelling and palpitations.  Gastrointestinal:  Positive for diarrhea. Negative for abdominal pain, constipation, nausea and vomiting.  Genitourinary:  Negative for bladder incontinence, difficulty urinating, dysuria, frequency, hematuria and nocturia.   Musculoskeletal:  Negative for arthralgias, back pain, flank pain, myalgias and neck pain.  Skin:  Negative for itching and rash.  Neurological:  Positive for numbness. Negative for dizziness and headaches.  Hematological:  Does not bruise/bleed easily.  Psychiatric/Behavioral:  Negative for depression, sleep disturbance and suicidal ideas. The patient is not nervous/anxious.   All other systems reviewed and are negative.    VITALS:   Blood pressure 118/72, pulse (!) 53, temperature 97.9 F (36.6 C), temperature source Oral, resp. rate 18, weight 191 lb 9.3 oz (86.9 kg), SpO2 97%.  Wt Readings from Last 3 Encounters:  01/17/24 191 lb 9.3 oz (86.9 kg)  11/14/23 191 lb (86.6 kg)  10/17/23 194 lb 10.7 oz (88.3 kg)    Body mass index is 27.49 kg/m.  Performance status (ECOG): 1 - Symptomatic but completely ambulatory  PHYSICAL EXAM:  Physical Exam Vitals and nursing note reviewed. Exam conducted with a chaperone present.  Constitutional:      Appearance: Normal appearance.   Cardiovascular:     Rate and Rhythm: Normal rate and regular rhythm.     Pulses: Normal pulses.     Heart sounds: Normal heart sounds.  Pulmonary:     Effort: Pulmonary effort is normal.     Breath sounds: Normal breath sounds.  Abdominal:     Palpations: Abdomen is soft. There is no hepatomegaly, splenomegaly or mass.     Tenderness: There is no abdominal tenderness.   Musculoskeletal:     Right lower leg: No edema.     Left lower leg: No edema.  Lymphadenopathy:     Cervical: No cervical adenopathy.     Right  cervical: No superficial, deep or posterior cervical adenopathy.    Left cervical: No superficial, deep or posterior cervical adenopathy.     Upper Body:     Right upper body: No supraclavicular or axillary adenopathy.     Left upper body: No supraclavicular or axillary adenopathy.   Neurological:     General: No focal deficit present.     Mental Status: He is alert and oriented to person, place, and time.   Psychiatric:        Mood and Affect: Mood normal.        Behavior: Behavior normal.     LABS:      Latest Ref Rng & Units 01/11/2024   11:38 AM 11/14/2023    1:17 PM 10/11/2023    5:22 AM  CBC  WBC 4.0 - 10.5 K/uL 5.7  7.8  7.8   Hemoglobin 13.0 - 17.0 g/dL 88.1  88.7  89.3   Hematocrit 39.0 - 52.0 % 34.5  34.0  32.5   Platelets 150 - 400 K/uL 171  181  151       Latest Ref Rng & Units 01/11/2024   11:38 AM 11/14/2023    1:17 PM 10/17/2023    2:38 PM  CMP  Glucose 70 - 99 mg/dL 816  894    BUN 8 - 23 mg/dL 39  26    Creatinine 9.38 - 1.24 mg/dL 8.51  8.78    Sodium 864 - 145 mmol/L 136  140    Potassium 3.5 - 5.1 mmol/L 4.4  3.9    Chloride 98 - 111 mmol/L 106  105    CO2 22 - 32 mmol/L 19  23    Calcium  8.9 - 10.3 mg/dL 9.0  9.1    Total Protein 6.5 - 8.1 g/dL 7.1  6.8  6.7   Total Bilirubin 0.0 - 1.2 mg/dL 1.2  1.2  1.0   Alkaline Phos 38 - 126 U/L 158  153  219   AST 15 - 41 U/L 60  36  61   ALT 0 - 44 U/L 68  39  65      Lab Results  Component Value Date   CEA1 36.9 (H) 01/11/2024   /  CEA  Date Value Ref Range Status  01/11/2024 36.9 (H) 0.0 - 4.7 ng/mL Final    Comment:    (NOTE)                             Nonsmokers          <3.9  Smokers             <5.6 Roche [employer] Electrochemiluminescence Immunoassay (ECLIA) Values obtained with different assay methods or kits cannot be used interchangeably.  Results cannot be interpreted as absolute evidence of the presence or absence of malignant disease. Performed At: [facility] [address] [place], [place] [id number] Jennette Shorter MD Ey:[id number]    No results found for: PSA1 No results found for: CAN199 No results found for: RJW874  Lab Results  Component Value Date   TOTALPROTELP 6.3 01/25/2022   ALBUMINELP 3.6 01/25/2022   A1GS 0.2 01/25/2022   A2GS 0.7 01/25/2022   BETS 0.9 01/25/2022   GAMS 0.9 01/25/2022   MSPIKE Not Observed 01/25/2022   SPEI Comment 01/25/2022   Lab Results  Component Value Date   TIBC 322 10/08/2023   TIBC 290 08/02/2023   TIBC 327 06/27/2023   FERRITIN 168 10/08/2023   FERRITIN 142 08/02/2023   FERRITIN 87 06/27/2023   IRONPCTSAT 33 10/08/2023   IRONPCTSAT 31 08/02/2023   IRONPCTSAT 43 (H) 06/27/2023   No results found for: LDH   STUDIES:   MR LIVER W WO CONTRAST Result Date: 01/13/2024 CLINICAL DATA:  Metastatic colon cancer, liver lesion status post ablation EXAM: MRI ABDOMEN WITHOUT AND WITH CONTRAST TECHNIQUE: Multiplanar multisequence MR imaging of the abdomen was performed both before and after the administration of intravenous contrast. CONTRAST:  9mL GADAVIST  GADOBUTROL  1 MMOL/ML IV SOLN COMPARISON:  MR abdomen, 09/18/2023, CT-guided ablation, 10/10/2023 FINDINGS: Lower chest: No acute abnormality. Pulmonary nodules in the included lung bases (series 16, image 14). Hepatobiliary: Interval ablation of a metastasis of the inferior margin of hepatic segment VI, with expected T1 hyperintense coagulative appearance and no internal contrast enhancement (series 13, image 47, series 21, image 47). There is however residual or recurrent, rim enhancing tumor at the superior margin of the ablation site measuring 4.1 x 3.6 x 1.8 cm (series 16, image 41, series 26, image 42). No gallstones, gallbladder wall thickening, or biliary dilatation. Pancreas: Unremarkable. No pancreatic ductal dilatation or surrounding inflammatory changes. Spleen: Normal in size without significant abnormality.  Adrenals/Urinary Tract: Adrenal glands are unremarkable. Numerous bilateral renal cortical cysts, requiring no specific further follow-up or characterization. Kidneys are otherwise normal, without obvious renal calculi, solid lesion, or hydronephrosis. Stomach/Bowel: Stomach is within normal limits. No evidence of bowel wall thickening, distention, or inflammatory changes. Colonic diverticulosis. Vascular/Lymphatic: Aortic atherosclerosis. No enlarged abdominal lymph nodes. Other: No abdominal wall hernia or abnormality. No ascites. Musculoskeletal: No acute or significant osseous findings. IMPRESSION: 1. Interval ablation of a metastasis of hepatic segment VI. Residual or recurrent rim enhancing tumor at the superior margin of the ablation site measuring 4.1 x 3.6 x 1.8 cm. 2. Pulmonary nodules in the included lung bases, consistent with metastatic disease and better assessed by separately reported same day CT examination. 3. Colonic diverticulosis. Electronically Signed   By: Marolyn JONETTA Jaksch M.D.   On: 01/13/2024 06:31   CT CHEST ABDOMEN PELVIS W CONTRAST Result Date: 01/13/2024 CLINICAL DATA:  Metastatic colon cancer restaging, liver metastasis status post thermal ablation 312 * Tracking Code: BO * EXAM: CT CHEST, ABDOMEN, AND PELVIS WITH CONTRAST TECHNIQUE: Multidetector CT imaging of the chest, abdomen and pelvis was performed following the standard protocol during bolus administration of intravenous contrast. RADIATION DOSE REDUCTION: This exam was performed according to the departmental dose-optimization program which includes automated exposure control, adjustment of the mA and/or kV according to patient size and/or use of iterative  reconstruction technique. CONTRAST:  80mL OMNIPAQUE  IOHEXOL  300 MG/ML  SOLN COMPARISON:  PET-CT, 07/19/2023, CT-guided thermal ablation, 10/10/2023 FINDINGS: CT CHEST FINDINGS Cardiovascular: Severe aortic atherosclerosis. Aortic valve calcifications. Normal heart size.  Three-vessel coronary artery calcifications. No pericardial effusion. Mediastinum/Nodes: No enlarged mediastinal, hilar, or axillary lymph nodes. Thyroid gland, trachea, and esophagus demonstrate no significant findings. Lungs/Pleura: Mild, predominantly paraseptal emphysema. Numerous new and enlarged bilateral pulmonary nodules, largest index nodule in the infrahilar right lower lobe measuring 1.4 x 1.3 cm, previously 0.5 cm (series 3, image 111) additional index nodule in the superior segment left lower lobe measuring 0.8 x 0.7 cm, previously 0.2 cm (series 3, image 72). No pleural effusion or pneumothorax. Musculoskeletal: No chest wall abnormality. No acute osseous findings. CT ABDOMEN PELVIS FINDINGS Hepatobiliary: Hypodense ablation site of hepatic segment VI with overlying capsular retraction, measuring 4.7 x 4.6 cm (series 2, image 63). No new liver lesions. No gallstones, gallbladder wall thickening, or biliary dilatation. Pancreas: Unremarkable. No pancreatic ductal dilatation or surrounding inflammatory changes. Spleen: Normal in size without significant abnormality. Adrenals/Urinary Tract: Adrenal glands are unremarkable. Simple, benign bilateral renal cortical cysts kidneys are otherwise normal, without renal calculi, solid lesion, or hydronephrosis. Bladder is unremarkable. Stomach/Bowel: Stomach is within normal limits. Status post partial transverse and descending hemicolectomy. Severe pancolonic diverticulosis. Normal appendix. No bowel distention. Vascular/Lymphatic: Severe aortic atherosclerosis. No enlarged abdominal or pelvic lymph nodes. Reproductive: Prostatomegaly. Other: Small midline ventral incisional hernia containing nonobstructed loops of small bowel (series 2, image 62). No ascites. Musculoskeletal: No acute osseous findings. Unchanged inferior endplate wedge deformity of L3. IMPRESSION: 1. Numerous new and enlarged bilateral pulmonary nodules, consistent with worsened pulmonary  metastatic disease. 2. Expected appearance of ablation site of hepatic segment VI with overlying capsular retraction. No new liver lesions appreciated by CT. Please see forthcoming, separately reported MR examination for further assessment. 3. Status post partial transverse and descending hemicolectomy. 4. Severe pancolonic diverticulosis. 5. Coronary artery disease. Aortic Atherosclerosis (ICD10-I70.0) and Emphysema (ICD10-J43.9). Electronically Signed   By: Marolyn JONETTA Jaksch M.D.   On: 01/13/2024 06:24

## 2024-01-17 ENCOUNTER — Inpatient Hospital Stay (HOSPITAL_BASED_OUTPATIENT_CLINIC_OR_DEPARTMENT_OTHER): Admitting: Hematology

## 2024-01-17 ENCOUNTER — Inpatient Hospital Stay

## 2024-01-17 VITALS — BP 118/72 | HR 53 | Temp 97.9°F | Resp 18 | Wt 191.6 lb

## 2024-01-17 DIAGNOSIS — R918 Other nonspecific abnormal finding of lung field: Secondary | ICD-10-CM

## 2024-01-17 DIAGNOSIS — K769 Liver disease, unspecified: Secondary | ICD-10-CM | POA: Diagnosis not present

## 2024-01-17 DIAGNOSIS — C187 Malignant neoplasm of sigmoid colon: Secondary | ICD-10-CM | POA: Diagnosis not present

## 2024-01-17 NOTE — Patient Instructions (Addendum)
 [facility] at [facility] Discharge Instructions   You were seen and examined today by Dr. Cheree Cords.  He reviewed the results of your lab work which are normal/stable.   He reviewed the results of your MRI of the liver and the CT scan. 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 The CT scan is showing enlarging pulmonary nodules.   We will obtain a special blood test today called Guardant 360. This will let us  know if there are any mutations of the cancer to target for treatment.    Return as scheduled.    Thank you for choosing [facility] at [facility] to provide your oncology and hematology care.  To afford each patient quality time with our provider, please arrive at least 15 minutes before your scheduled appointment time.   If you have a lab appointment with the Cancer Center please come in thru the Main Entrance and check in at the main information desk.  You need to re-schedule your appointment should you arrive 10 or more minutes late.  We strive to give you quality time with our providers, and arriving late affects you and other patients whose appointments are after yours.  Also, if you no show three or more times for appointments you may be dismissed from the clinic at the providers discretion.     Again, thank you for choosing [facility].  Our hope is that these requests will decrease the amount of time that you wait before being seen by our physicians.       _____________________________________________________________  Should you have questions after your visit to [facility], please contact our office at [phone] and follow the prompts.  Our office hours are 8:00 a.m. and 4:30 p.m. Monday - Friday.  Please note that voicemails left after 4:00 p.m. may not be returned until the following business day.  We are closed weekends and major holidays.  You do have access to a nurse 24-7, just call the  main number to the clinic [phone] and do not press any options, hold on the line and a nurse will answer the phone.    For prescription refill requests, have your pharmacy contact our office and allow 72 hours.    Due to Covid, you will need to wear a mask upon entering the hospital. If you do not have a mask, a mask will be given to you at the Main Entrance upon arrival. For doctor visits, patients may have 1 support person age 80 or older with them. For treatment visits, patients can not have anyone with them due to social distancing guidelines and our immunocompromised population.

## 2024-01-24 ENCOUNTER — Encounter (HOSPITAL_COMMUNITY)
Admission: RE | Admit: 2024-01-24 | Discharge: 2024-01-24 | Disposition: A | Discharge status: Home / Self Care | Source: Ambulatory Visit | Attending: Hematology | Admitting: Hematology

## 2024-01-24 DIAGNOSIS — R918 Other nonspecific abnormal finding of lung field: Secondary | ICD-10-CM | POA: Insufficient documentation

## 2024-01-24 DIAGNOSIS — C187 Malignant neoplasm of sigmoid colon: Secondary | ICD-10-CM | POA: Diagnosis present

## 2024-01-24 DIAGNOSIS — K769 Liver disease, unspecified: Secondary | ICD-10-CM | POA: Diagnosis present

## 2024-01-24 MED ORDER — FLUDEOXYGLUCOSE F - 18 (FDG) INJECTION
10.2400 | Freq: Once | INTRAVENOUS | Status: AC | PRN
  Administered 2024-01-24: 10.24 via INTRAVENOUS

## 2024-01-29 NOTE — Progress Notes (Incomplete)
 [facility] [address], [place] [address]    Clinic Day:  01/29/2024  Referring physician: Meade Bigness, MD  Patient Care Team: Meade Bigness, MD as PCP - General (Internal Medicine) Celestia Joesph SQUIBB, RN as Oncology Nurse Navigator (Medical Oncology) Rogers Hai, MD as Medical Oncologist (Medical Oncology)   ASSESSMENT & PLAN:   Assessment: 1. Stage II (T3N0) sigmoid colon cancer, MMR preserved: - Anemia with hemoglobin of 8, recent admission with diverticulitis.  He also had weight loss of 40 pounds in the last 6 months. - Colonoscopy by Dr. Eartha (01/10/2022): Fungating and ulcerated partially obstructing mass found at 42 cm proximal to the anus.  Mass is circumferential and measured 3 cm in length. - Pathology: Invasive moderately differentiated adenocarcinoma of the descending colon mass.  Transverse colon polypectomy shows fragments of at least intramucosal adenocarcinoma and high-grade dysplasia. - CT CAP (01/13/2022): No evidence of metastatic disease in the chest, abdomen or pelvis.  Several tiny lung nodules and scattered areas of interstitial irregularity in the right upper lobe likely postinflammatory. - Preoperative CEA (01/10/2022): 7.9. - Left hemicolectomy (02/17/2022): Moderately differentiated grade 2 adenocarcinoma, no perforation, no lymphovascular/perineural invasion, margins negative.  0/21 lymph nodes involved.  PT3 pN0. - PET scan on 11/09/2022: 3.2 x 2.3 cm right hepatic lobe metastasis. - MRI of the liver on 11/22/2022: Isolated inferior right hepatic lobe 2.8 cm. - Right lobe lesion thermal ablation on 12/27/2022. - Percutaneous microwave thermal ablation of the right lobe liver mass on 10/10/2023   2. Social/family history: - Lives at home with his wife.  Wife works at [employer] nephrology office in [place].  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  Quit smoking in 1995.  Smoked 1 to 2 packs/day for 25  years. - Son had DVT.  Son is also a carrier for factor V Leiden.  Mother had bladder cancer and uterine cancer.   3.  Left leg DVT: - Patient had a left leg DVT approximately 2 years ago.  His wife thinks that DVT has happened about 4 to 6 weeks after his second COVID infection.  He was very minimally symptomatic from the COVID infection.  At the time of the DVT, he was completely mobile and did not have any surgery. - Patient was heterozygous for factor V Leiden as checked by Dr. Devonda on 09/20/2011. - I have recommended that he get the records of timing of DVT and the COVID test.  If it is within 4 to 6 weeks of his COVID diagnosis, we may safely discontinue anticoagulation considering it can be provoked by COVID infection.  If it is unprovoked DVT, may continue low intensity anticoagulation.   4.  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 - Adjuvant XRT 50 Gray, completed 08/11/2015.    Plan: 1.  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 - His energy levels are back to baseline. - Reviewed labs from 01/11/2024: AST and ALT are minimally elevated.  Creatinine is 1.48.  CBC grossly normal.  CEA is elevated at 36.9 although decreased from 93. - MRI liver from 01/11/2024: Ablation defect with residual or recurrent rim-enhancing tumor at the superior margin of the ablation site measuring 4.1 x 3.6 x 1.8 cm. - CT CAP on 01/11/2024: Numerous new and enlarging bilateral lung nodules, largest  measuring 1.4 cm in the right lower lobe. - Recommend sending Guardant[employer].  We will do PET scan to identify area to biopsy on the liver.  We will send for NGS testing. - RTC after PET scan/biopsy.    2.  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  Hemoglobin today is 11.8.   3.  Left leg DVT: - Continue  Eliquis  daily.  No bleeding issues.  4.  Low back pain: - He has left-sided low back pain which is improving.  Continue tramadol  as needed.    No orders of the defined types were placed in this encounter.     LILLETTE Verneta SAUNDERS Teague,acting as a Neurosurgeon for Alean Stands, MD.,have documented all relevant documentation on the behalf of Alean Stands, MD,as directed by  Alean Stands, MD while in the presence of Alean Stands, MD.  ***     Star R Teague   7/1/202512:02 PM  CHIEF COMPLAINT:   Diagnosis: sigmoid colon cancer and iron deficiency anemia    Cancer Staging  Cancer of sigmoid colon [facility]) Staging form: Colon and Rectum, AJCC 8th Edition - Clinical stage from 01/25/2022: Stage IIA (cT3, cN0, cM0) - Signed by Stands Alean, MD on 03/16/2022 - Pathologic stage from 01/17/2024: Stage IVB (rpTX, rpNX, rcM1b) - Signed by Stands Alean, MD on 01/17/2024    Prior Therapy: 1. Left hemicolectomy on 02/17/2022  2. Microwave ablation of liver metastasis, 12/27/22  Current Therapy:  Surveillance    HISTORY OF PRESENT ILLNESS:   Oncology History  Cancer of sigmoid colon (HCC)  01/25/2022 Initial Diagnosis   Cancer of sigmoid colon (HCC)   01/25/2022 Cancer Staging   Staging form: Colon and Rectum, AJCC 8th Edition - Clinical stage from 01/25/2022: Stage IIA (cT3, cN0, cM0) - Signed by Stands Alean, MD on 03/16/2022 Histopathologic type: Adenocarcinoma, NOS Stage prefix: Initial diagnosis Total positive nodes: 0 Total nodes examined: 21 Histologic grade (G): G2 Histologic grading system: 4 grade system Microsatellite instability (MSI): Stable   01/17/2024 Cancer Staging   Staging form: Colon and Rectum, AJCC 8th Edition - Pathologic stage from 01/17/2024: Stage IVB (rpTX, rpNX, rcM1b) - Signed by Stands Alean, MD on 01/17/2024 Stage prefix: Recurrence      INTERVAL HISTORY:   Baptiste is a 89 y.o. male presenting to clinic today  for follow up of sigmoid colon cancer and iron deficiency anemia. He was last seen by me on 01/17/24.  Since his last visit, he underwent restaging PET on 01/24/24 that found:  Hypermetabolic right hepatic lobe masses, compatible with metastatic disease. The larger lesion has increased in size and metabolic activity compared to the prior PET scan. There is also a new smaller satellite right hepatic lobe lesion which is hypermetabolic. Scattered hypermetabolic pulmonary nodules, new compared to the prior chest CT but shown on the diagnostic CT from 01/11/2024. These are compatible with pulmonary metastases. Chronic left maxillary sinusitis. Left mastoid effusion. Suspected left otitis media. Ventral hernia containing small bowel above the umbilicus, without findings of obstruction or strangulation. Prostatomegaly. Aortic Atherosclerosis.  Today, he states that he is doing well overall. His appetite level is at ***%. His energy level is at ***%.   PAST MEDICAL HISTORY:   Past Medical History: Past Medical History:  Diagnosis Date   Adenomatous colon polyp    Anemia    Carotid bruit    Chronic dyspnea    Diabetes (HCC)    type 2   Diverticular disease    DVT (deep  venous thrombosis) (HCC)    lower limb   Dyspnea    Factor V deficiency (HCC)    GERD (gastroesophageal reflux disease)    Hearing loss    bilateral   Hyperlipemia    Hypertension    Inguinal hernia    Kidney disease    stage 3   Lung nodule    Lymphadenopathy    Malignant neoplasm (HCC)    salivary gland   Murmur    Osteoarthritis    Senile purpura (HCC)     Surgical History: Past Surgical History:  Procedure Laterality Date   BIOPSY  01/10/2022   Procedure: BIOPSY;  Surgeon: Eartha Angelia Sieving, MD;  [facility];  Service: Gastroenterology;;   COLON SURGERY     COLONOSCOPY WITH PROPOFOL  N/A 01/10/2022   Procedure: COLONOSCOPY WITH PROPOFOL ;  Surgeon: Eartha Angelia Sieving, MD;  Location: [facility];  Service: Gastroenterology;  Laterality: N/A;  per Anette Caldron, pt to arrive at 9:15   COLONOSCOPY WITH PROPOFOL  N/A 02/27/2023   Procedure: COLONOSCOPY WITH PROPOFOL ;  Surgeon: Eartha Angelia Sieving, MD;  Location: [facility];  Service: Gastroenterology;  Laterality: N/A;  9:15am;asa 3   ENDOSCOPIC RETROGRADE CHOLANGIOPANCREATOGRAPHY (ERCP) WITH PROPOFOL  N/A 09/20/2023   Procedure: ENDOSCOPIC RETROGRADE CHOLANGIOPANCREATOGRAPHY (ERCP) WITH PROPOFOL ;  Surgeon: Charlanne Groom, MD;  Location: [facility] ENDOSCOPY;  Service: Gastroenterology;  Laterality: N/A;   ESOPHAGOGASTRODUODENOSCOPY (EGD) WITH PROPOFOL  N/A 01/10/2022   Procedure: ESOPHAGOGASTRODUODENOSCOPY (EGD) WITH PROPOFOL ;  Surgeon: Eartha Angelia Sieving, MD;  [facility]: [facility];  Service: Gastroenterology;  Laterality: N/A;   HAND / FINGER LESION EXCISION     HERNIA REPAIR     IR RADIOLOGIST EVAL & MGMT  04/04/2023   IR RADIOLOGIST EVAL & MGMT  08/09/2023   MASTOIDECTOMY     PILONIDAL CYST EXCISION     POLYPECTOMY  01/10/2022   Procedure: POLYPECTOMY;  Surgeon: Eartha Angelia Sieving, MD;  [facility]: [facility];  Service: Gastroenterology;;   POLYPECTOMY  02/27/2023   Procedure: POLYPECTOMY;  Surgeon: Eartha Angelia Sieving, MD;  [facility]: [facility];  Service: Gastroenterology;;   RADIOLOGY WITH ANESTHESIA N/A 12/27/2022   Procedure: CT microwave ablation of the liver;  Surgeon: Luverne Aran, MD;  Location: [facility];  Service: Radiology;  Laterality: N/A;   RADIOLOGY WITH ANESTHESIA N/A 10/10/2023   Procedure: CT WITH ANESTHESIA MICROWAVE ABLATION OF LIVER;  Surgeon: Luverne Aran, MD;  Location: [facility];  Service: Radiology;  Laterality: N/A;   REMOVAL OF STONES  09/20/2023   Procedure: REMOVAL OF STONES;  Surgeon: Charlanne Groom, MD;  Location: [facility] ENDOSCOPY;  Service: Gastroenterology;;   ANNETT  09/20/2023   Procedure: ANNETT;  Surgeon: Charlanne Groom, MD;  Location: [facility] ENDOSCOPY;  Service:  Gastroenterology;;   ANNETT  09/20/2023   Procedure: OLEVIA;  Surgeon: Charlanne Groom, MD;  Location: [facility] ENDOSCOPY;  Service: Gastroenterology;;   SUBMUCOSAL TATTOO INJECTION  01/10/2022   Procedure: SUBMUCOSAL TATTOO INJECTION;  Surgeon: Eartha Angelia Sieving, MD;  [facility];  Service: Gastroenterology;;   SURGERY OF LIP     skin cancer    Social History: Social History   Socioeconomic History   Marital status: Married    Spouse name: Not on file   Number of children: Not on file   Years of education: Not on file   Highest education level: Not on file  Occupational History   Not on file  Tobacco Use   Smoking status: Former    Current packs/day: 0.00  Average packs/day: 2.0 packs/day for 25.0 years (50.0 ttl pk-yrs)    Types: Cigarettes    Start date: 3    Quit date: 61    Years since quitting: 30.5    Passive exposure: Past   Smokeless tobacco: Never  Vaping Use   Vaping status: Never Used  Substance and Sexual Activity   Alcohol use: Never   Drug use: Never   Sexual activity: Not on file  Other Topics Concern   Not on file  Social History Narrative   Not on file   Social Drivers of Health   Financial Resource Strain: Not on file  Food Insecurity: No Food Insecurity (10/10/2023)   Hunger Vital Sign    Worried About Running Out of Food in the Last Year: Never true    Ran Out of Food in the Last Year: Never true  Transportation Needs: No Transportation Needs (10/10/2023)   PRAPARE - Administrator, Civil Service (Medical): No    Lack of Transportation (Non-Medical): No  Physical Activity: Not on file  Stress: Not on file  Social Connections: Moderately Integrated (10/10/2023)   Social Connection and Isolation Panel    Frequency of Communication with Friends and Family: More than three times a week    Frequency of Social Gatherings with Friends and Family: Once a week    Attends Religious Services: More than 4  times per year    Active Member of [employer] or Organizations: No    Attends Banker Meetings: Never    Marital Status: Married  Catering manager Violence: Not At Risk (10/10/2023)   Humiliation, Afraid, Rape, and Kick questionnaire    Fear of Current or Ex-Partner: No    Emotionally Abused: No    Physically Abused: No    Sexually Abused: No    Family History: No family history on file.  Current Medications:  Current Outpatient Medications:    amitriptyline  (ELAVIL ) 10 MG tablet, Take 10 mg by mouth every evening., Disp: , Rfl:    amLODipine  (NORVASC ) 5 MG tablet, Take 5 mg by mouth 2 (two) times daily., Disp: , Rfl:    ascorbic acid  (VITAMIN C /NATURAL ROSE HIPS) 1000 MG tablet, Take 1,000 mg by mouth every evening., Disp: , Rfl:    atorvastatin  (LIPITOR) 40 MG tablet, Take 40 mg by mouth every evening., Disp: , Rfl:    carvedilol  (COREG ) 6.25 MG tablet, Take 6.25 mg by mouth 2 (two) times daily with a meal., Disp: , Rfl:    Cholecalciferol  (VITAMIN D3) 125 MCG (5000 UT) CAPS, Take 5,000 Units by mouth every evening., Disp: , Rfl:    dorzolamide -timolol (COSOPT) 2-0.5 % ophthalmic solution, Place 1 drop into both eyes 2 (two) times daily., Disp: , Rfl:    ELIQUIS  5 MG TABS tablet, TAKE 1 TABLET(5 MG) BY MOUTH TWICE DAILY, Disp: 60 tablet, Rfl: 3   ferrous sulfate  325 (65 FE) MG tablet, Take 325 mg by mouth daily with breakfast., Disp: , Rfl:    folic acid  (FOLVITE ) 1 MG tablet, Take 1 tablet (1 mg total) by mouth every evening., Disp: 30 tablet, Rfl: 0   hydrALAZINE  (APRESOLINE ) 25 MG tablet, Take 50 mg by mouth in the morning and at bedtime., Disp: , Rfl:    KERENDIA  10 MG TABS, Take 10 mg by mouth daily., Disp: , Rfl:    latanoprost  (XALATAN ) 0.005 % ophthalmic solution, Place 1 drop into both eyes at bedtime., Disp: , Rfl:    linagliptin  (TRADJENTA ) 5  MG TABS tablet, Take 5 mg by mouth in the morning., Disp: , Rfl:    loratadine  (CLARITIN ) 10 MG tablet, Take 10 mg by mouth  every morning., Disp: , Rfl:    Misc Natural Products (PROSTATE SUPPORT PO), Take 1 capsule by mouth every evening. NOW Prostate Support, Disp: , Rfl:    omeprazole  (PRILOSEC) 40 MG capsule, Take 1 capsule (40 mg total) by mouth daily., Disp: 90 capsule, Rfl: 3   ondansetron  (ZOFRAN ) 4 MG tablet, Take 1 tablet (4 mg total) by mouth every 6 (six) hours as needed for nausea., Disp: 20 tablet, Rfl: 0   ondansetron  (ZOFRAN -ODT) 4 MG disintegrating tablet, 4mg  ODT q4 hours prn nausea/vomit, Disp: 10 tablet, Rfl: 0   traMADol  (ULTRAM ) 50 MG tablet, Take 1 tablet (50 mg total) by mouth at bedtime as needed., Disp: 30 tablet, Rfl: 0   vitamin B-12 (CYANOCOBALAMIN ) 1000 MCG tablet, Take 1,000 mcg by mouth every evening. Now (Brand), Disp: , Rfl:    Allergies: Allergies  Allergen Reactions   Gadobenate Nausea And Vomiting    Immediately upon the infusion of 15mL multihance contrast  Patient had exteme nausea and vomiting.   No other symptoms noted .  No injury.  MRI scan was completed after patient felt better.   Immediately upon the infusion of 15mL multihance contrast  Patient had exteme nausea and vomiting.   No other symptoms noted .  No injury.  MRI scan was completed after patient felt better.       REVIEW OF SYSTEMS:   Review of Systems  Constitutional:  Negative for chills, fatigue and fever.  HENT:   Negative for lump/mass, mouth sores, nosebleeds, sore throat and trouble swallowing.   Eyes:  Negative for eye problems.  Respiratory:  Negative for cough and shortness of breath.   Cardiovascular:  Negative for chest pain, leg swelling and palpitations.  Gastrointestinal:  Negative for abdominal pain, constipation, diarrhea, nausea and vomiting.  Genitourinary:  Negative for bladder incontinence, difficulty urinating, dysuria, frequency, hematuria and nocturia.   Musculoskeletal:  Negative for arthralgias, back pain, flank pain, myalgias and neck pain.  Skin:  Negative for itching and rash.   Neurological:  Negative for dizziness, headaches and numbness.  Hematological:  Does not bruise/bleed easily.  Psychiatric/Behavioral:  Negative for depression, sleep disturbance and suicidal ideas. The patient is not nervous/anxious.   All other systems reviewed and are negative.    VITALS:   There were no vitals taken for this visit.  Wt Readings from Last 3 Encounters:  01/17/24 191 lb 9.3 oz (86.9 kg)  11/14/23 191 lb (86.6 kg)  10/17/23 194 lb 10.7 oz (88.3 kg)    There is no height or weight on file to calculate BMI.  Performance status (ECOG): 1 - Symptomatic but completely ambulatory  PHYSICAL EXAM:   Physical Exam Vitals and nursing note reviewed. Exam conducted with a chaperone present.  Constitutional:      Appearance: Normal appearance.   Cardiovascular:     Rate and Rhythm: Normal rate and regular rhythm.     Pulses: Normal pulses.     Heart sounds: Normal heart sounds.  Pulmonary:     Effort: Pulmonary effort is normal.     Breath sounds: Normal breath sounds.  Abdominal:     Palpations: Abdomen is soft. There is no hepatomegaly, splenomegaly or mass.     Tenderness: There is no abdominal tenderness.   Musculoskeletal:     Right lower leg: No edema.  Left lower leg: No edema.  Lymphadenopathy:     Cervical: No cervical adenopathy.     Right cervical: No superficial, deep or posterior cervical adenopathy.    Left cervical: No superficial, deep or posterior cervical adenopathy.     Upper Body:     Right upper body: No supraclavicular or axillary adenopathy.     Left upper body: No supraclavicular or axillary adenopathy.   Neurological:     General: No focal deficit present.     Mental Status: He is alert and oriented to person, place, and time.   Psychiatric:        Mood and Affect: Mood normal.        Behavior: Behavior normal.     LABS:      Latest Ref Rng & Units 01/11/2024   11:38 AM 11/14/2023    1:17 PM 10/11/2023    5:22 AM  CBC   WBC 4.0 - 10.5 K/uL 5.7  7.8  7.8   Hemoglobin 13.0 - 17.0 g/dL 88.1  88.7  89.3   Hematocrit 39.0 - 52.0 % 34.5  34.0  32.5   Platelets 150 - 400 K/uL 171  181  151       Latest Ref Rng & Units 01/11/2024   11:38 AM 11/14/2023    1:17 PM 10/17/2023    2:38 PM  CMP  Glucose 70 - 99 mg/dL 816  894    BUN 8 - 23 mg/dL 39  26    Creatinine 9.38 - 1.24 mg/dL 8.51  8.78    Sodium 864 - 145 mmol/L 136  140    Potassium 3.5 - 5.1 mmol/L 4.4  3.9    Chloride 98 - 111 mmol/L 106  105    CO2 22 - 32 mmol/L 19  23    Calcium  8.9 - 10.3 mg/dL 9.0  9.1    Total Protein 6.5 - 8.1 g/dL 7.1  6.8  6.7   Total Bilirubin 0.0 - 1.2 mg/dL 1.2  1.2  1.0   Alkaline Phos 38 - 126 U/L 158  153  219   AST 15 - 41 U/L 60  36  61   ALT 0 - 44 U/L 68  39  65      Lab Results  Component Value Date   CEA1 36.9 (H) 01/11/2024   /  CEA  Date Value Ref Range Status  01/11/2024 36.9 (H) 0.0 - 4.7 ng/mL Final    Comment:    (NOTE)                             Nonsmokers          <3.9                             Smokers             <5.6 Roche [employer] Electrochemiluminescence Immunoassay (ECLIA) Values obtained with different assay methods or kits cannot be used interchangeably.  Results cannot be interpreted as absolute evidence of the presence or absence of malignant disease. Performed At: [facility] [address] [place], [place] [id number] Jennette Shorter MD Ey:[phone]    No results found for: PSA1 No results found for: CAN199 No results found for: [id number]  Lab Results  Component Value Date   TOTALPROTELP 6.3 01/25/2022   ALBUMINELP 3.6 01/25/2022   A1GS 0.2 01/25/2022  A2GS 0.7 01/25/2022   BETS 0.9 01/25/2022   GAMS 0.9 01/25/2022   MSPIKE Not Observed 01/25/2022   SPEI Comment 01/25/2022   Lab Results  Component Value Date   TIBC 322 10/08/2023   TIBC 290 08/02/2023   TIBC 327 06/27/2023   FERRITIN 168 10/08/2023   FERRITIN 142 08/02/2023   FERRITIN 87  06/27/2023   IRONPCTSAT 33 10/08/2023   IRONPCTSAT 31 08/02/2023   IRONPCTSAT 43 (H) 06/27/2023   No results found for: LDH   STUDIES:   NM PET Image Restage (PS) Skull Base to Thigh (F-18 FDG) Result Date: 01/25/2024 CLINICAL DATA:  Subsequent treatment strategy for metastatic colon cancer. Prior thermal ablation of hepatic metastatic disease. EXAM: NUCLEAR MEDICINE PET SKULL BASE TO THIGH TECHNIQUE: 10.2 mCi F-18 FDG was injected intravenously. Full-ring PET imaging was performed from the skull base to thigh after the radiotracer. CT data was obtained and used for attenuation correction and anatomic localization. Fasting blood glucose: 139 mg/dl COMPARISON:  Multiple exams, including 07/19/2023 FINDINGS: Mediastinal blood pool activity: SUV max 2.2 Liver activity: SUV max NA NECK: No significant abnormal hypermetabolic activity in this region. Incidental CT findings: Chronic left maxillary sinusitis. Left mastoid effusion. Suspected left otitis media. CHEST: Scattered hypermetabolic pulmonary nodules are new compared to the prior chest CT but were shown on the diagnostic CT from 01/11/2024. Index right lower lobe lesion 1.2 cm in diameter with maximum SUV 3.2 on image 71 series 202. Index left upper lobe lesion 0.9 cm in diameter on image 51 series 202 with maximum SUV 3.4. Incidental CT findings: Coronary, aortic arch, and branch vessel atherosclerotic vascular disease. ABDOMEN/PELVIS: 4.9 cm hypermetabolic right hepatic lobe mass with maximum SUV 16.3. Small satellite 1.6 cm right hepatic lobe hypermetabolic nodule observed with maximum SUV 9.9. Both of these are visible on image 93 series 194. On the prior PET scan, the right hepatic lobe lesion measured 3.6 cm in diameter with maximum SUV of 11.6. Photopenic site inferiorly in the right hepatic lobe likely from prior ablation. Scattered physiologic activity in bowel. Incidental CT findings: Atherosclerosis is present, including aortoiliac  atherosclerotic disease. Pneumobilia. Photopenic bilateral renal cysts merit no further imaging workup. Ventral hernia containing small bowel above the umbilicus. No findings of obstruction or strangulation. Diverticulosis in the remaining colon.  Prostatomegaly. SKELETON: No significant abnormal hypermetabolic activity in this region. Incidental CT findings: None. IMPRESSION: 1. Hypermetabolic right hepatic lobe masses, compatible with metastatic disease. The larger lesion has increased in size and metabolic activity compared to the prior PET scan. There is also a new smaller satellite right hepatic lobe lesion which is hypermetabolic. 2. Scattered hypermetabolic pulmonary nodules, new compared to the prior chest CT but shown on the diagnostic CT from 01/11/2024. These are compatible with pulmonary metastases. 3. Chronic left maxillary sinusitis. Left mastoid effusion. Suspected left otitis media. 4. Ventral hernia containing small bowel above the umbilicus, without findings of obstruction or strangulation. 5. Prostatomegaly. 6.  Aortic Atherosclerosis (ICD10-I70.0). Electronically Signed   By: Ryan Salvage M.D.   On: 01/25/2024 14:30   MR LIVER W WO CONTRAST Result Date: 01/13/2024 CLINICAL DATA:  Metastatic colon cancer, liver lesion status post ablation EXAM: MRI ABDOMEN WITHOUT AND WITH CONTRAST TECHNIQUE: Multiplanar multisequence MR imaging of the abdomen was performed both before and after the administration of intravenous contrast. CONTRAST:  9mL GADAVIST  GADOBUTROL  1 MMOL/ML IV SOLN COMPARISON:  MR abdomen, 09/18/2023, CT-guided ablation, 10/10/2023 FINDINGS: Lower chest: No acute abnormality. Pulmonary nodules in the included lung  bases (series 16, image 14). Hepatobiliary: Interval ablation of a metastasis of the inferior margin of hepatic segment VI, with expected T1 hyperintense coagulative appearance and no internal contrast enhancement (series 13, image 47, series 21, image 47). There is  however residual or recurrent, rim enhancing tumor at the superior margin of the ablation site measuring 4.1 x 3.6 x 1.8 cm (series 16, image 41, series 26, image 42). No gallstones, gallbladder wall thickening, or biliary dilatation. Pancreas: Unremarkable. No pancreatic ductal dilatation or surrounding inflammatory changes. Spleen: Normal in size without significant abnormality. Adrenals/Urinary Tract: Adrenal glands are unremarkable. Numerous bilateral renal cortical cysts, requiring no specific further follow-up or characterization. Kidneys are otherwise normal, without obvious renal calculi, solid lesion, or hydronephrosis. Stomach/Bowel: Stomach is within normal limits. No evidence of bowel wall thickening, distention, or inflammatory changes. Colonic diverticulosis. Vascular/Lymphatic: Aortic atherosclerosis. No enlarged abdominal lymph nodes. Other: No abdominal wall hernia or abnormality. No ascites. Musculoskeletal: No acute or significant osseous findings. IMPRESSION: 1. Interval ablation of a metastasis of hepatic segment VI. Residual or recurrent rim enhancing tumor at the superior margin of the ablation site measuring 4.1 x 3.6 x 1.8 cm. 2. Pulmonary nodules in the included lung bases, consistent with metastatic disease and better assessed by separately reported same day CT examination. 3. Colonic diverticulosis. Electronically Signed   By: Marolyn JONETTA Jaksch M.D.   On: 01/13/2024 06:31   CT CHEST ABDOMEN PELVIS W CONTRAST Result Date: 01/13/2024 CLINICAL DATA:  Metastatic colon cancer restaging, liver metastasis status post thermal ablation 312 * Tracking Code: BO * EXAM: CT CHEST, ABDOMEN, AND PELVIS WITH CONTRAST TECHNIQUE: Multidetector CT imaging of the chest, abdomen and pelvis was performed following the standard protocol during bolus administration of intravenous contrast. RADIATION DOSE REDUCTION: This exam was performed according to the departmental dose-optimization program which includes  automated exposure control, adjustment of the mA and/or kV according to patient size and/or use of iterative reconstruction technique. CONTRAST:  80mL OMNIPAQUE  IOHEXOL  300 MG/ML  SOLN COMPARISON:  PET-CT, 07/19/2023, CT-guided thermal ablation, 10/10/2023 FINDINGS: CT CHEST FINDINGS Cardiovascular: Severe aortic atherosclerosis. Aortic valve calcifications. Normal heart size. Three-vessel coronary artery calcifications. No pericardial effusion. Mediastinum/Nodes: No enlarged mediastinal, hilar, or axillary lymph nodes. Thyroid gland, trachea, and esophagus demonstrate no significant findings. Lungs/Pleura: Mild, predominantly paraseptal emphysema. Numerous new and enlarged bilateral pulmonary nodules, largest index nodule in the infrahilar right lower lobe measuring 1.4 x 1.3 cm, previously 0.5 cm (series 3, image 111) additional index nodule in the superior segment left lower lobe measuring 0.8 x 0.7 cm, previously 0.2 cm (series 3, image 72). No pleural effusion or pneumothorax. Musculoskeletal: No chest wall abnormality. No acute osseous findings. CT ABDOMEN PELVIS FINDINGS Hepatobiliary: Hypodense ablation site of hepatic segment VI with overlying capsular retraction, measuring 4.7 x 4.6 cm (series 2, image 63). No new liver lesions. No gallstones, gallbladder wall thickening, or biliary dilatation. Pancreas: Unremarkable. No pancreatic ductal dilatation or surrounding inflammatory changes. Spleen: Normal in size without significant abnormality. Adrenals/Urinary Tract: Adrenal glands are unremarkable. Simple, benign bilateral renal cortical cysts kidneys are otherwise normal, without renal calculi, solid lesion, or hydronephrosis. Bladder is unremarkable. Stomach/Bowel: Stomach is within normal limits. Status post partial transverse and descending hemicolectomy. Severe pancolonic diverticulosis. Normal appendix. No bowel distention. Vascular/Lymphatic: Severe aortic atherosclerosis. No enlarged abdominal or  pelvic lymph nodes. Reproductive: Prostatomegaly. Other: Small midline ventral incisional hernia containing nonobstructed loops of small bowel (series 2, image 62). No ascites. Musculoskeletal: No acute osseous findings. Unchanged inferior endplate  wedge deformity of L3. IMPRESSION: 1. Numerous new and enlarged bilateral pulmonary nodules, consistent with worsened pulmonary metastatic disease. 2. Expected appearance of ablation site of hepatic segment VI with overlying capsular retraction. No new liver lesions appreciated by CT. Please see forthcoming, separately reported MR examination for further assessment. 3. Status post partial transverse and descending hemicolectomy. 4. Severe pancolonic diverticulosis. 5. Coronary artery disease. Aortic Atherosclerosis (ICD10-I70.0) and Emphysema (ICD10-J43.9). Electronically Signed   By: Marolyn JONETTA Jaksch M.D.   On: 01/13/2024 06:24

## 2024-01-30 ENCOUNTER — Other Ambulatory Visit: Payer: Self-pay | Admitting: *Deleted

## 2024-01-30 ENCOUNTER — Inpatient Hospital Stay: Admitting: Hematology

## 2024-01-30 DIAGNOSIS — K769 Liver disease, unspecified: Secondary | ICD-10-CM

## 2024-01-30 NOTE — Progress Notes (Signed)
 Herbert Sieving, MD  [facility] PROCEDURE / BIOPSY REVIEW Date: 01/30/24  Requested Biopsy site: R liver PET+ lesion Reason for request: r/o residual/recurrent Imaging review: Best seen on PET 01/24/24  Decision: Approved Imaging modality to perform: CT Schedule with: Moderate Sedation Schedule for: Any VIR  Additional comments:   Please contact me with questions, concerns, or if issue pertaining to this request arise.  Dayne Wood Johann, MD [facility]  \       Previous Messages    ----- Message ----- From: Dashanna Kinnamon Sent: 01/30/2024   8:26 AM EDT To: Embrie Mikkelsen; Ir Procedure Requests Subject: CT Biopsy                                      Procedure : CT Biopsy  Reason: bx right hepatic lobe mass Dx: Liver lesion, right lobe [K76.9 (ICD-10-CM)]    History : NM PET Restage skull base to thigh , CT Chest abd pevl w/ , MR liver w/wo  Provider : Rogers Hai, MD  Contact : [phone]

## 2024-01-31 ENCOUNTER — Other Ambulatory Visit: Payer: Self-pay | Admitting: Student

## 2024-01-31 DIAGNOSIS — C189 Malignant neoplasm of colon, unspecified: Secondary | ICD-10-CM

## 2024-01-31 NOTE — H&P (Signed)
 Chief Complaint: Metastatic colon cancer  Referring Provider(s): Rogers  Supervising Physician: Philip Cornet  Patient Status: [facility] - Out-pt  History of Present Illness: Herbert Wood is a 89 y.o. male with stage IV colon cancer with mets to the liver.  He underwent microwave thermal ablation of the right hepatic lobe mass on 10/10/2023 by Dr. Luverne.  MRI liver from 01/11/2024: Ablation defect with residual or recurrent rim-enhancing tumor at the superior margin of the ablation site measuring 4.1 x 3.6 x 1.8 cm.  CT CAP on 01/11/2024: Numerous new and enlarging bilateral lung nodules, largest measuring 1.4 cm in the right lower lobe.  PET showed Hypermetabolic right hepatic lobe masses, compatible with metastatic disease. The larger lesion has increased in size and metabolic activity compared to the prior PET scan.  He is here today for CT guided biopsy.  He is NPO since 8p yesterday. No nausea/vomiting. No Fever/chills. No abnormal bleeding, abd pain, N/V, dysuria, CP, SOB. ROS negative.  He has noticed that it was difficult to recover from general anesthesia when he underwent the ablation in March from a mobility standpoint. Discussed that today's procedure to be with moderate sedation. Does not use CPAP or wear supplemental O2.   He last took Eliquis  Friday night.    Patient is Full Code  Past Medical History:  Diagnosis Date   Adenomatous colon polyp    Anemia    Carotid bruit    Chronic dyspnea    Diabetes (HCC)    type 2   Diverticular disease    DVT (deep venous thrombosis) (HCC)    lower limb   Dyspnea    Factor V deficiency (HCC)    GERD (gastroesophageal reflux disease)    Hearing loss    bilateral   Hyperlipemia    Hypertension    Inguinal hernia    Kidney disease    stage 3   Lung nodule    Lymphadenopathy    Malignant neoplasm (HCC)    salivary gland   Murmur    Osteoarthritis    Senile purpura (HCC)     Past Surgical History:   Procedure Laterality Date   BIOPSY  01/10/2022   Procedure: BIOPSY;  Surgeon: Eartha Angelia Sieving, MD;  Location: [facility];  Service: Gastroenterology;;   COLON SURGERY     COLONOSCOPY WITH PROPOFOL  N/A 01/10/2022   Procedure: COLONOSCOPY WITH PROPOFOL ;  Surgeon: Eartha Angelia Sieving, MD;  Location: [facility];  Service: Gastroenterology;  Laterality: N/A;  per Anette Caldron, pt to arrive at 9:15   COLONOSCOPY WITH PROPOFOL  N/A 02/27/2023   Procedure: COLONOSCOPY WITH PROPOFOL ;  Surgeon: Eartha Angelia Sieving, MD;  Location: [facility];  Service: Gastroenterology;  Laterality: N/A;  9:15am;asa 3   ENDOSCOPIC RETROGRADE CHOLANGIOPANCREATOGRAPHY (ERCP) WITH PROPOFOL  N/A 09/20/2023   Procedure: ENDOSCOPIC RETROGRADE CHOLANGIOPANCREATOGRAPHY (ERCP) WITH PROPOFOL ;  Surgeon: Charlanne Groom, MD;  Location: [facility] ENDOSCOPY;  Service: Gastroenterology;  Laterality: N/A;   ESOPHAGOGASTRODUODENOSCOPY (EGD) WITH PROPOFOL  N/A 01/10/2022   Procedure: ESOPHAGOGASTRODUODENOSCOPY (EGD) WITH PROPOFOL ;  Surgeon: Eartha Angelia Sieving, MD;  Location: [facility];  Service: Gastroenterology;  Laterality: N/A;   HAND / FINGER LESION EXCISION     HERNIA REPAIR     IR RADIOLOGIST EVAL & MGMT  04/04/2023   IR RADIOLOGIST EVAL & MGMT  08/09/2023   MASTOIDECTOMY     PILONIDAL CYST EXCISION     POLYPECTOMY  01/10/2022   Procedure: POLYPECTOMY;  Surgeon: Eartha Angelia Sieving, MD;  Location:  [facility];  Service: Gastroenterology;;   POLYPECTOMY  02/27/2023   Procedure: POLYPECTOMY;  Surgeon: Eartha Angelia Sieving, MD;  Location: [facility];  Service: Gastroenterology;;   RADIOLOGY WITH ANESTHESIA N/A 12/27/2022   Procedure: CT microwave ablation of the liver;  Surgeon: Luverne Aran, MD;  Location: [facility];  Service: Radiology;  Laterality: N/A;   RADIOLOGY WITH ANESTHESIA N/A 10/10/2023   Procedure: CT WITH ANESTHESIA MICROWAVE ABLATION OF LIVER;  Surgeon: Luverne Aran, MD;   Location: [facility];  Service: Radiology;  Laterality: N/A;   REMOVAL OF STONES  09/20/2023   Procedure: REMOVAL OF STONES;  Surgeon: Charlanne Groom, MD;  Location: [facility] ENDOSCOPY;  Service: Gastroenterology;;   ANNETT  09/20/2023   Procedure: ANNETT;  Surgeon: Charlanne Groom, MD;  Location: [facility] ENDOSCOPY;  Service: Gastroenterology;;   ANNETT  09/20/2023   Procedure: OLEVIA;  Surgeon: Charlanne Groom, MD;  Location: [facility] ENDOSCOPY;  Service: Gastroenterology;;   SUBMUCOSAL TATTOO INJECTION  01/10/2022   Procedure: SUBMUCOSAL TATTOO INJECTION;  Surgeon: Eartha Angelia Sieving, MD;  Location: [facility];  Service: Gastroenterology;;   SURGERY OF LIP     skin cancer    Allergies: Gadobenate  Medications: Prior to Admission medications   Medication Sig Start Date End Date Taking? Authorizing Provider  amitriptyline  (ELAVIL ) 10 MG tablet Take 10 mg by mouth every evening.    [provider]  amLODipine  (NORVASC ) 5 MG tablet Take 5 mg by mouth 2 (two) times daily.    [provider]  ascorbic acid  (VITAMIN C /NATURAL ROSE HIPS) 1000 MG tablet Take 1,000 mg by mouth every evening.    [provider]  atorvastatin  (LIPITOR) 40 MG tablet Take 40 mg by mouth every evening.    [provider]  carvedilol  (COREG ) 6.25 MG tablet Take 6.25 mg by mouth 2 (two) times daily with a meal.    [provider]  Cholecalciferol  (VITAMIN D3) 125 MCG (5000 UT) CAPS Take 5,000 Units by mouth every evening.    [provider]  dorzolamide -timolol (COSOPT) 2-0.5 % ophthalmic solution Place 1 drop into both eyes 2 (two) times daily.    [provider]  ELIQUIS  5 MG TABS tablet TAKE 1 TABLET(5 MG) BY MOUTH TWICE DAILY 10/29/23   Rogers Hai, MD  ferrous sulfate  325 (65 FE) MG tablet Take 325 mg by mouth daily with breakfast.    [provider]  folic acid  (FOLVITE ) 1 MG tablet Take 1 tablet (1 mg total) by mouth  every evening. 09/21/23   Arlon Carliss ORN, DO  hydrALAZINE  (APRESOLINE ) 25 MG tablet Take 50 mg by mouth in the morning and at bedtime.    [provider]  KERENDIA  10 MG TABS Take 10 mg by mouth daily.    [provider]  latanoprost  (XALATAN ) 0.005 % ophthalmic solution Place 1 drop into both eyes at bedtime.    [provider]  linagliptin  (TRADJENTA ) 5 MG TABS tablet Take 5 mg by mouth in the morning.    [provider]  loratadine  (CLARITIN ) 10 MG tablet Take 10 mg by mouth every morning.    [provider]  Misc Natural Products (PROSTATE SUPPORT PO) Take 1 capsule by mouth every evening. NOW Prostate Support    [provider]  omeprazole  (PRILOSEC) 40 MG capsule Take 1 capsule (40 mg total) by mouth daily. 05/21/23   Carlan, Chelsea L, NP  ondansetron  (ZOFRAN ) 4 MG tablet Take 1 tablet (4 mg total) by mouth every 6 (  six) hours as needed for nausea. 09/21/23   Arlon Carliss ORN, DO  ondansetron  (ZOFRAN -ODT) 4 MG disintegrating tablet 4mg  ODT q4 hours prn nausea/vomit 09/16/23   Zammit, Joseph, MD  traMADol  (ULTRAM ) 50 MG tablet Take 1 tablet (50 mg total) by mouth at bedtime as needed. 10/17/23   Rogers Hai, MD  vitamin B-12 (CYANOCOBALAMIN ) 1000 MCG tablet Take 1,000 mcg by mouth every evening. Now Mitchael)    [provider]     No family history on file.  Social History   Socioeconomic History   Marital status: Married    Spouse name: Not on file   Number of children: Not on file   Years of education: Not on file   Highest education level: Not on file  Occupational History   Not on file  Tobacco Use   Smoking status: Former    Current packs/day: 0.00    Average packs/day: 2.0 packs/day for 25.0 years (50.0 ttl pk-yrs)    Types: Cigarettes    Start date: 73    Quit date: 28    Years since quitting: 30.5    Passive exposure: Past   Smokeless tobacco: Never  Vaping Use   Vaping status: Never Used   Substance and Sexual Activity   Alcohol use: Never   Drug use: Never   Sexual activity: Not on file  Other Topics Concern   Not on file  Social History Narrative   Not on file   Social Drivers of Health   Financial Resource Strain: Not on file  Food Insecurity: No Food Insecurity (10/10/2023)   Hunger Vital Sign    Worried About Running Out of Food in the Last Year: Never true    Ran Out of Food in the Last Year: Never true  Transportation Needs: No Transportation Needs (10/10/2023)   PRAPARE - Administrator, Civil Service (Medical): No    Lack of Transportation (Non-Medical): No  Physical Activity: Not on file  Stress: Not on file  Social Connections: Moderately Integrated (10/10/2023)   Social Connection and Isolation Panel    Frequency of Communication with Friends and Family: More than three times a week    Frequency of Social Gatherings with Friends and Family: Once a week    Attends Religious Services: More than 4 times per year    Active Member of [employer] or Organizations: No    Attends Banker Meetings: Never    Marital Status: Married     Review of Systems: A 12 point ROS discussed and pertinent positives are indicated in the HPI above.  All other systems are negative.   Vital Signs: BP (!) 157/56   Pulse (!) 45   Temp (!) 97.5 F (36.4 C) (Oral)   Resp 16   Ht 5' 10 (1.778 m)   Wt 190 lb (86.2 kg)   SpO2 97%   BMI 27.26 kg/m   Advance Care Plan: The advanced care place/surrogate decision maker was discussed at the time of visit and the patient did not wish to discuss or was not able to name a surrogate decision maker or provide an advance care plan.  Physical Exam Vitals reviewed.  Constitutional:      Appearance: Normal appearance.  HENT:     Head: Normocephalic and atraumatic.  Eyes:     Extraocular Movements: Extraocular movements intact.  Cardiovascular:     Rate and Rhythm: Normal rate and regular rhythm.  Pulmonary:      Effort: Pulmonary  effort is normal. No respiratory distress.     Breath sounds: Normal breath sounds.  Abdominal:     General: There is no distension.     Palpations: Abdomen is soft.     Tenderness: There is no abdominal tenderness.  Musculoskeletal:        General: Normal range of motion.     Cervical back: Normal range of motion.  Skin:    General: Skin is warm and dry.  Neurological:     General: No focal deficit present.     Mental Status: He is alert and oriented to person, place, and time.  Psychiatric:        Mood and Affect: Mood normal.        Behavior: Behavior normal.        Thought Content: Thought content normal.        Judgment: Judgment normal.     Imaging: NM PET Image Restage (PS) Skull Base to Thigh (F-18 FDG) Result Date: 01/25/2024 CLINICAL DATA:  Subsequent treatment strategy for metastatic colon cancer. Prior thermal ablation of hepatic metastatic disease. EXAM: NUCLEAR MEDICINE PET SKULL BASE TO THIGH TECHNIQUE: 10.2 mCi F-18 FDG was injected intravenously. Full-ring PET imaging was performed from the skull base to thigh after the radiotracer. CT data was obtained and used for attenuation correction and anatomic localization. Fasting blood glucose: 139 mg/dl COMPARISON:  Multiple exams, including 07/19/2023 FINDINGS: Mediastinal blood pool activity: SUV max 2.2 Liver activity: SUV max NA NECK: No significant abnormal hypermetabolic activity in this region. Incidental CT findings: Chronic left maxillary sinusitis. Left mastoid effusion. Suspected left otitis media. CHEST: Scattered hypermetabolic pulmonary nodules are new compared to the prior chest CT but were shown on the diagnostic CT from 01/11/2024. Index right lower lobe lesion 1.2 cm in diameter with maximum SUV 3.2 on image 71 series 202. Index left upper lobe lesion 0.9 cm in diameter on image 51 series 202 with maximum SUV 3.4. Incidental CT findings: Coronary, aortic arch, and branch vessel atherosclerotic  vascular disease. ABDOMEN/PELVIS: 4.9 cm hypermetabolic right hepatic lobe mass with maximum SUV 16.3. Small satellite 1.6 cm right hepatic lobe hypermetabolic nodule observed with maximum SUV 9.9. Both of these are visible on image 93 series 194. On the prior PET scan, the right hepatic lobe lesion measured 3.6 cm in diameter with maximum SUV of 11.6. Photopenic site inferiorly in the right hepatic lobe likely from prior ablation. Scattered physiologic activity in bowel. Incidental CT findings: Atherosclerosis is present, including aortoiliac atherosclerotic disease. Pneumobilia. Photopenic bilateral renal cysts merit no further imaging workup. Ventral hernia containing small bowel above the umbilicus. No findings of obstruction or strangulation. Diverticulosis in the remaining colon.  Prostatomegaly. SKELETON: No significant abnormal hypermetabolic activity in this region. Incidental CT findings: None. IMPRESSION: 1. Hypermetabolic right hepatic lobe masses, compatible with metastatic disease. The larger lesion has increased in size and metabolic activity compared to the prior PET scan. There is also a new smaller satellite right hepatic lobe lesion which is hypermetabolic. 2. Scattered hypermetabolic pulmonary nodules, new compared to the prior chest CT but shown on the diagnostic CT from 01/11/2024. These are compatible with pulmonary metastases. 3. Chronic left maxillary sinusitis. Left mastoid effusion. Suspected left otitis media. 4. Ventral hernia containing small bowel above the umbilicus, without findings of obstruction or strangulation. 5. Prostatomegaly. 6.  Aortic Atherosclerosis (ICD10-I70.0). Electronically Signed   By: Ryan Salvage M.D.   On: 01/25/2024 14:30   MR LIVER W WO CONTRAST Result Date: 01/13/2024  CLINICAL DATA:  Metastatic colon cancer, liver lesion status post ablation EXAM: MRI ABDOMEN WITHOUT AND WITH CONTRAST TECHNIQUE: Multiplanar multisequence MR imaging of the abdomen was  performed both before and after the administration of intravenous contrast. CONTRAST:  9mL GADAVIST  GADOBUTROL  1 MMOL/ML IV SOLN COMPARISON:  MR abdomen, 09/18/2023, CT-guided ablation, 10/10/2023 FINDINGS: Lower chest: No acute abnormality. Pulmonary nodules in the included lung bases (series 16, image 14). Hepatobiliary: Interval ablation of a metastasis of the inferior margin of hepatic segment VI, with expected T1 hyperintense coagulative appearance and no internal contrast enhancement (series 13, image 47, series 21, image 47). There is however residual or recurrent, rim enhancing tumor at the superior margin of the ablation site measuring 4.1 x 3.6 x 1.8 cm (series 16, image 41, series 26, image 42). No gallstones, gallbladder wall thickening, or biliary dilatation. Pancreas: Unremarkable. No pancreatic ductal dilatation or surrounding inflammatory changes. Spleen: Normal in size without significant abnormality. Adrenals/Urinary Tract: Adrenal glands are unremarkable. Numerous bilateral renal cortical cysts, requiring no specific further follow-up or characterization. Kidneys are otherwise normal, without obvious renal calculi, solid lesion, or hydronephrosis. Stomach/Bowel: Stomach is within normal limits. No evidence of bowel wall thickening, distention, or inflammatory changes. Colonic diverticulosis. Vascular/Lymphatic: Aortic atherosclerosis. No enlarged abdominal lymph nodes. Other: No abdominal wall hernia or abnormality. No ascites. Musculoskeletal: No acute or significant osseous findings. IMPRESSION: 1. Interval ablation of a metastasis of hepatic segment VI. Residual or recurrent rim enhancing tumor at the superior margin of the ablation site measuring 4.1 x 3.6 x 1.8 cm. 2. Pulmonary nodules in the included lung bases, consistent with metastatic disease and better assessed by separately reported same day CT examination. 3. Colonic diverticulosis. Electronically Signed   By: Marolyn JONETTA Jaksch M.D.   On:  01/13/2024 06:31   CT CHEST ABDOMEN PELVIS W CONTRAST Result Date: 01/13/2024 CLINICAL DATA:  Metastatic colon cancer restaging, liver metastasis status post thermal ablation 312 * Tracking Code: BO * EXAM: CT CHEST, ABDOMEN, AND PELVIS WITH CONTRAST TECHNIQUE: Multidetector CT imaging of the chest, abdomen and pelvis was performed following the standard protocol during bolus administration of intravenous contrast. RADIATION DOSE REDUCTION: This exam was performed according to the departmental dose-optimization program which includes automated exposure control, adjustment of the mA and/or kV according to patient size and/or use of iterative reconstruction technique. CONTRAST:  80mL OMNIPAQUE  IOHEXOL  300 MG/ML  SOLN COMPARISON:  PET-CT, 07/19/2023, CT-guided thermal ablation, 10/10/2023 FINDINGS: CT CHEST FINDINGS Cardiovascular: Severe aortic atherosclerosis. Aortic valve calcifications. Normal heart size. Three-vessel coronary artery calcifications. No pericardial effusion. Mediastinum/Nodes: No enlarged mediastinal, hilar, or axillary lymph nodes. Thyroid gland, trachea, and esophagus demonstrate no significant findings. Lungs/Pleura: Mild, predominantly paraseptal emphysema. Numerous new and enlarged bilateral pulmonary nodules, largest index nodule in the infrahilar right lower lobe measuring 1.4 x 1.3 cm, previously 0.5 cm (series 3, image 111) additional index nodule in the superior segment left lower lobe measuring 0.8 x 0.7 cm, previously 0.2 cm (series 3, image 72). No pleural effusion or pneumothorax. Musculoskeletal: No chest wall abnormality. No acute osseous findings. CT ABDOMEN PELVIS FINDINGS Hepatobiliary: Hypodense ablation site of hepatic segment VI with overlying capsular retraction, measuring 4.7 x 4.6 cm (series 2, image 63). No new liver lesions. No gallstones, gallbladder wall thickening, or biliary dilatation. Pancreas: Unremarkable. No pancreatic ductal dilatation or surrounding  inflammatory changes. Spleen: Normal in size without significant abnormality. Adrenals/Urinary Tract: Adrenal glands are unremarkable. Simple, benign bilateral renal cortical cysts kidneys are otherwise normal, without renal calculi, solid  lesion, or hydronephrosis. Bladder is unremarkable. Stomach/Bowel: Stomach is within normal limits. Status post partial transverse and descending hemicolectomy. Severe pancolonic diverticulosis. Normal appendix. No bowel distention. Vascular/Lymphatic: Severe aortic atherosclerosis. No enlarged abdominal or pelvic lymph nodes. Reproductive: Prostatomegaly. Other: Small midline ventral incisional hernia containing nonobstructed loops of small bowel (series 2, image 62). No ascites. Musculoskeletal: No acute osseous findings. Unchanged inferior endplate wedge deformity of L3. IMPRESSION: 1. Numerous new and enlarged bilateral pulmonary nodules, consistent with worsened pulmonary metastatic disease. 2. Expected appearance of ablation site of hepatic segment VI with overlying capsular retraction. No new liver lesions appreciated by CT. Please see forthcoming, separately reported MR examination for further assessment. 3. Status post partial transverse and descending hemicolectomy. 4. Severe pancolonic diverticulosis. 5. Coronary artery disease. Aortic Atherosclerosis (ICD10-I70.0) and Emphysema (ICD10-J43.9). Electronically Signed   By: Marolyn JONETTA Jaksch M.D.   On: 01/13/2024 06:24    Labs:  CBC: Recent Labs    10/11/23 0522 11/14/23 1317 01/11/24 1138 02/04/24 0750  WBC 7.8 7.8 5.7 7.7  HGB 10.6* 11.2* 11.8* 12.4*  HCT 32.5* 34.0* 34.5* 37.3*  PLT 151 181 171 184    COAGS: Recent Labs    09/07/23 1322 09/17/23 0428 09/19/23 1819 09/21/23 0624 10/10/23 0722 02/04/24 0750  INR 1.2 1.6*  --   --  1.1 1.0  APTT 32  --  77* 120*  --   --     BMP: Recent Labs    10/08/23 0905 10/10/23 0722 11/14/23 1317 01/11/24 1138  NA 138 139 140 136  K 4.1 3.8 3.9 4.4   CL 106 108 105 106  CO2 23 21* 23 19*  GLUCOSE 147* 139* 105* 183*  BUN 27* 28* 26* 39*  CALCIUM  9.1 9.1 9.1 9.0  CREATININE 1.30* 1.49* 1.21 1.48*  GFRNONAA 53* 45* 58* 46*    LIVER FUNCTION TESTS: Recent Labs    10/10/23 0722 10/17/23 1438 11/14/23 1317 01/11/24 1138  BILITOT 2.0* 1.0 1.2 1.2  AST 37 61* 36 60*  ALT 42 65* 39 68*  ALKPHOS 167* 219* 153* 158*  PROT 6.5 6.7 6.8 7.1  ALBUMIN  3.3* 2.9* 3.4* 3.6    TUMOR MARKERS: No results for input(s): AFPTM, CEA, CA199, CHROMGRNA in the last 8760 hours.  Assessment and Plan:  Stage IV colon cancer = Hypermetabolic right hepatic lobe masses, compatible with metastatic disease. The larger lesion has increased in size and metabolic activity compared to the prior PET scan.  Will proceed with image guided biopsy today by Dr. Philip.  His BP is 157/56 currently. He has not taken any of his home BP medications (hydralazine , carvedilol , amlodipine ). Directed to take his home hydralazine  at this time.  No other contraindications identified for procedure today identified.   Risks and benefits of liver biopsy was discussed with the patient and/or patient's family including, but not limited to bleeding, infection, damage to adjacent structures or low yield requiring additional tests.  All of the questions were answered and there is agreement to proceed. Discussed post procedural red flag signs indicating need to present to ER after discharge.  Consent signed and in chart.    Electronically Signed: Laymon Coast, NP   02/04/2024, 8:45 AM      I spent a total of    25 Minutes in face to face in clinical consultation, greater than 50% of which was counseling/coordinating care for liver lesion biopsy.

## 2024-02-03 ENCOUNTER — Other Ambulatory Visit: Payer: Self-pay | Admitting: Student

## 2024-02-04 ENCOUNTER — Other Ambulatory Visit: Payer: Self-pay

## 2024-02-04 ENCOUNTER — Ambulatory Visit (HOSPITAL_COMMUNITY)
Admission: RE | Admit: 2024-02-04 | Discharge: 2024-02-04 | Disposition: A | Discharge status: Home / Self Care | Source: Ambulatory Visit | Attending: Hematology | Admitting: Hematology

## 2024-02-04 VITALS — BP 105/61 | HR 48 | Temp 97.5°F | Resp 17 | Ht 70.0 in | Wt 190.0 lb

## 2024-02-04 DIAGNOSIS — C189 Malignant neoplasm of colon, unspecified: Secondary | ICD-10-CM | POA: Diagnosis present

## 2024-02-04 DIAGNOSIS — Z87891 Personal history of nicotine dependence: Secondary | ICD-10-CM | POA: Insufficient documentation

## 2024-02-04 DIAGNOSIS — C787 Secondary malignant neoplasm of liver and intrahepatic bile duct: Secondary | ICD-10-CM | POA: Insufficient documentation

## 2024-02-04 DIAGNOSIS — K769 Liver disease, unspecified: Secondary | ICD-10-CM

## 2024-02-04 DIAGNOSIS — R918 Other nonspecific abnormal finding of lung field: Secondary | ICD-10-CM | POA: Diagnosis not present

## 2024-02-04 LAB — PROTIME-INR
INR: 1 (ref 0.8–1.2)
Prothrombin Time: 13.9 s (ref 11.4–15.2)

## 2024-02-04 LAB — CBC
HCT: 37.3 % — ABNORMAL LOW (ref 39.0–52.0)
Hemoglobin: 12.4 g/dL — ABNORMAL LOW (ref 13.0–17.0)
MCH: 33 pg (ref 26.0–34.0)
MCHC: 33.2 g/dL (ref 30.0–36.0)
MCV: 99.2 fL (ref 80.0–100.0)
Platelets: 184 K/uL (ref 150–400)
RBC: 3.76 MIL/uL — ABNORMAL LOW (ref 4.22–5.81)
RDW: 13.5 % (ref 11.5–15.5)
WBC: 7.7 K/uL (ref 4.0–10.5)
nRBC: 0 % (ref 0.0–0.2)

## 2024-02-04 MED ORDER — HYDROCODONE-ACETAMINOPHEN 5-325 MG PO TABS: 1.0000 | ORAL_TABLET | ORAL | Status: DC | PRN

## 2024-02-04 MED ORDER — MIDAZOLAM HCL 2 MG/2ML IJ SOLN
INTRAMUSCULAR | Status: AC | PRN
  Administered 2024-02-04: .5 mg via INTRAVENOUS
  Administered 2024-02-04: 1 mg via INTRAVENOUS
  Administered 2024-02-04: .5 mg via INTRAVENOUS

## 2024-02-04 MED ORDER — FENTANYL CITRATE (PF) 100 MCG/2ML IJ SOLN
INTRAMUSCULAR | Status: AC | PRN
  Administered 2024-02-04 (×3): 25 ug via INTRAVENOUS

## 2024-02-04 MED ORDER — LIDOCAINE HCL 1 % IJ SOLN
10.0000 mL | Freq: Once | INTRAMUSCULAR | Status: AC
  Administered 2024-02-04: 10 mL via INTRADERMAL

## 2024-02-04 MED ORDER — MIDAZOLAM HCL 2 MG/2ML IJ SOLN
INTRAMUSCULAR | Status: AC
  Filled 2024-02-04: qty 2

## 2024-02-04 MED ORDER — FENTANYL CITRATE (PF) 100 MCG/2ML IJ SOLN
INTRAMUSCULAR | Status: AC
  Filled 2024-02-04: qty 2

## 2024-02-04 NOTE — Procedures (Signed)
 Interventional Radiology Procedure:   Indications: Metastatic colon cancer  Procedure: Image guided biopsy of right hepatic lobe  Findings: 3 core biopsies from right hepatic lesion.  Gelfoam slurry injected along biopsy tract.  Complications: None     EBL: Minimal  Plan: Bedrest 2 hours, then discharge to home  Nasiir Monts R. Philip, MD  Pager: [phone]

## 2024-02-06 LAB — SURGICAL PATHOLOGY

## 2024-02-18 ENCOUNTER — Ambulatory Visit
Admission: RE | Admit: 2024-02-18 | Discharge: 2024-02-18 | Disposition: A | Discharge status: Home / Self Care | Source: Ambulatory Visit

## 2024-02-18 DIAGNOSIS — C189 Malignant neoplasm of colon, unspecified: Secondary | ICD-10-CM

## 2024-02-18 HISTORY — PX: IR RADIOLOGIST EVAL & MGMT: IMG5224

## 2024-02-18 NOTE — Progress Notes (Signed)
 Chief Complaint: Patient was consulted remotely today (TeleHealth) for ollow-up after microwave ablation of a colorectal liver metastasis.  History of Present Illness: Herbert Wood is a 89 y.o. 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 He underwent percutaneous microwave thermal ablation of the lesion on 12/27/2022 at which time the lesion measured up to approximately 3.4 cm. Surveillance imaging demonstrated recurrence along the margin of prior ablation and he underwent a second microwave ablation procedure on 10/10/23. CT on 01/11/24 demonstrated multiple new and enlarging bilateral pulmonary metastases. PET on 01/24/24 demonstrated multiple hypermetabolic pulmonary metastatic lesions and metastatic disease in the liver superior to the area of prior liver ablation with a nearly 5 cm area of hypermetabolism in the right lobe and an adjacent satellite nodule.  He is currently asymptomatic except for some chronic dyspnea which he said is no different than his baseline.He underwent liver biopsy on 02/04/24.  Past Medical History:  Diagnosis Date   Adenomatous colon polyp    Anemia    Carotid bruit    Chronic dyspnea    Diabetes (HCC)    type 2   Diverticular disease    DVT (deep venous thrombosis) (HCC)    lower limb   Dyspnea    Factor V deficiency (HCC)    GERD (gastroesophageal reflux disease)    Hearing loss    bilateral   Hyperlipemia    Hypertension    Inguinal hernia    Kidney disease    stage 3   Lung nodule    Lymphadenopathy    Malignant neoplasm (HCC)    salivary gland   Murmur    Osteoarthritis    Senile purpura (HCC)     Past Surgical History:  Procedure Laterality Date   BIOPSY  01/10/2022   Procedure: BIOPSY;  Surgeon: Eartha Angelia Sieving, MD;  Location: [facility];  Service: Gastroenterology;;   COLON SURGERY     COLONOSCOPY WITH PROPOFOL  N/A  01/10/2022   Procedure: COLONOSCOPY WITH PROPOFOL ;  Surgeon: Eartha Angelia Sieving, MD;  Location: [facility];  Service: Gastroenterology;  Laterality: N/A;  per Anette Caldron, pt to arrive at 9:15   COLONOSCOPY WITH PROPOFOL  N/A 02/27/2023   Procedure: COLONOSCOPY WITH PROPOFOL ;  Surgeon: Eartha Angelia Sieving, MD;  Location: [facility];  Service: Gastroenterology;  Laterality: N/A;  9:15am;asa 3   ENDOSCOPIC RETROGRADE CHOLANGIOPANCREATOGRAPHY (ERCP) WITH PROPOFOL  N/A 09/20/2023   Procedure: ENDOSCOPIC RETROGRADE CHOLANGIOPANCREATOGRAPHY (ERCP) WITH PROPOFOL ;  Surgeon: Charlanne Groom, MD;  Location: [facility];  Service: Gastroenterology;  Laterality: N/A;   ESOPHAGOGASTRODUODENOSCOPY (EGD) WITH PROPOFOL  N/A 01/10/2022   Procedure: ESOPHAGOGASTRODUODENOSCOPY (EGD) WITH PROPOFOL ;  Surgeon: Eartha Angelia Sieving, MD;  [facility]: [facility];  Service: Gastroenterology;  Laterality: N/A;   HAND / FINGER LESION EXCISION     HERNIA REPAIR     IR RADIOLOGIST EVAL & MGMT  04/04/2023   IR RADIOLOGIST EVAL & MGMT  08/09/2023   MASTOIDECTOMY     PILONIDAL CYST EXCISION     POLYPECTOMY  01/10/2022   Procedure: POLYPECTOMY;  Surgeon: Eartha Angelia Sieving, MD;  [facility]: [facility];  Service: Gastroenterology;;   POLYPECTOMY  02/27/2023   Procedure: POLYPECTOMY;  Surgeon: Eartha Angelia Sieving, MD;  [facility]: [facility];  Service: Gastroenterology;;   RADIOLOGY WITH ANESTHESIA N/A 12/27/2022   Procedure: CT microwave ablation of the liver;  Surgeon: Luverne Aran, MD;  Location: [facility];  Service: Radiology;  Laterality: N/A;   RADIOLOGY WITH ANESTHESIA N/A 10/10/2023   Procedure: CT WITH ANESTHESIA MICROWAVE ABLATION OF LIVER;  Surgeon: Luverne Aran, MD;  Location: [facility];  Service: Radiology;  Laterality: N/A;   REMOVAL OF STONES  09/20/2023   Procedure: REMOVAL OF STONES;  Surgeon: Charlanne Groom, MD;  Location: [facility];  Service: Gastroenterology;;    ANNETT  09/20/2023   Procedure: ANNETT;  Surgeon: Charlanne Groom, MD;  Location: [facility];  Service: Gastroenterology;;   ANNETT  09/20/2023   Procedure: OLEVIA;  Surgeon: Charlanne Groom, MD;  Location: [facility]COPY;  Service: Gastroenterology;;   SUBMUCOSAL TATTOO INJECTION  01/10/2022   Procedure: SUBMUCOSAL TATTOO INJECTION;  Surgeon: Eartha Angelia Sieving, MD;  Location: [facility];  Service: Gastroenterology;;   SURGERY OF LIP     skin cancer    Allergies: Gadobenate  Medications: Prior to Admission medications   Medication Sig Start Date End Date Taking? Authorizing Provider  amitriptyline  (ELAVIL ) 10 MG tablet Take 10 mg by mouth every evening.    [provider]  amLODipine  (NORVASC ) 5 MG tablet Take 5 mg by mouth 2 (two) times daily.    [provider]  ascorbic acid  (VITAMIN C /NATURAL ROSE HIPS) 1000 MG tablet Take 1,000 mg by mouth every evening.    [provider]  atorvastatin  (LIPITOR) 40 MG tablet Take 40 mg by mouth every evening.    [provider]  carvedilol  (COREG ) 6.25 MG tablet Take 6.25 mg by mouth 2 (two) times daily with a meal.    [provider]  Cholecalciferol  (VITAMIN D3) 125 MCG (5000 UT) CAPS Take 5,000 Units by mouth every evening.    [provider]  dorzolamide -timolol (COSOPT) 2-0.5 % ophthalmic solution Place 1 drop into both eyes 2 (two) times daily.    [provider]  ELIQUIS  5 MG TABS tablet TAKE 1 TABLET(5 MG) BY MOUTH TWICE DAILY 10/29/23   Rogers Hai, MD  ferrous sulfate  325 (65 FE) MG tablet Take 325 mg by mouth daily with breakfast.    [provider]  folic acid  (FOLVITE ) 1 MG tablet Take 1 tablet (1 mg total) by mouth every evening. 09/21/23   Arlon Carliss ORN, DO  hydrALAZINE  (APRESOLINE ) 25 MG tablet Take 50 mg by mouth in the morning and at bedtime.    [provider]  KERENDIA  10 MG TABS Take 10 mg by mouth  daily.    [provider]  latanoprost  (XALATAN ) 0.005 % ophthalmic solution Place 1 drop into both eyes at bedtime.    [provider]  linagliptin  (TRADJENTA ) 5 MG TABS tablet Take 5 mg by mouth in the morning.    [provider]  loratadine  (CLARITIN ) 10 MG tablet Take 10 mg by mouth every morning.    [provider]  Misc Natural Products (PROSTATE SUPPORT PO) Take 1 capsule by mouth every evening. NOW Prostate Support    [provider]  omeprazole  (PRILOSEC) 40 MG capsule Take 1 capsule (40 mg total) by mouth daily. 05/21/23   Carlan, Chelsea L, NP  ondansetron  (ZOFRAN ) 4 MG tablet Take 1 tablet (4 mg total) by mouth every 6 (six) hours as needed for nausea. 09/21/23   Arlon Carliss ORN, DO  ondansetron  (ZOFRAN -ODT) 4 MG disintegrating tablet 4mg  ODT q4 hours prn nausea/vomit 09/16/23   Zammit, Joseph, MD  traMADol  (ULTRAM ) 50 MG tablet Take 1 tablet (50 mg total) by mouth at bedtime as needed. 10/17/23  Katragadda, Sreedhar, MD  vitamin B-12 (CYANOCOBALAMIN ) 1000 MCG tablet Take 1,000 mcg by mouth every evening. Now Mitchael)    [provider]     No family history on file.  Social History   Socioeconomic History   Marital status: Married    Spouse name: Not on file   Number of children: Not on file   Years of education: Not on file   Highest education level: Not on file  Occupational History   Not on file  Tobacco Use   Smoking status: Former    Current packs/day: 0.00    Average packs/day: 2.0 packs/day for 25.0 years (50.0 ttl pk-yrs)    Types: Cigarettes    Start date: 50    Quit date: 9    Years since quitting: 30.5    Passive exposure: Past   Smokeless tobacco: Never  Vaping Use   Vaping status: Never Used  Substance and Sexual Activity   Alcohol use: Never   Drug use: Never   Sexual activity: Not on file  Other Topics Concern   Not on file  Social History Narrative   Not on file   Social Drivers of  Health   Financial Resource Strain: Not on file  Food Insecurity: No Food Insecurity (10/10/2023)   Hunger Vital Sign    Worried About Running Out of Food in the Last Year: Never true    Ran Out of Food in the Last Year: Never true  Transportation Needs: No Transportation Needs (10/10/2023)   PRAPARE - Administrator, Civil Service (Medical): No    Lack of Transportation (Non-Medical): No  Physical Activity: Not on file  Stress: Not on file  Social Connections: Moderately Integrated (10/10/2023)   Social Connection and Isolation Panel    Frequency of Communication with Friends and Family: More than three times a week    Frequency of Social Gatherings with Friends and Family: Once a week    Attends Religious Services: More than 4 times per year    Active Member of [employer] or Organizations: No    Attends Banker Meetings: Never    Marital Status: Married    ECOG Status: 0 - Asymptomatic  Review of Systems  Constitutional: Negative.   Respiratory: Negative.    Cardiovascular: Negative.   Gastrointestinal: Negative.   Genitourinary: Negative.   Musculoskeletal: Negative.   Neurological: Negative.     Review of Systems: A 12 point ROS discussed and pertinent positives are indicated in the HPI above.  All other systems are negative.   Physical Exam No direct physical exam was performed (except for noted visual exam findings with Video Visits).   Vital Signs: There were no vitals taken for this visit.  Imaging: CT BIOPSY Result Date: 02/04/2024 INDICATION: 89 year old with metastatic colon cancer. Evaluate for new or recurrent liver disease. EXAM: IMAGE GUIDED LIVER LESION BIOPSY WITH CT AND ULTRASOUND GUIDANCE MEDICATIONS: Moderate sedation ANESTHESIA/SEDATION: Moderate (conscious) sedation was employed during this procedure. A total of Versed  2 mg and Fentanyl  75 mcg was administered intravenously by the radiology nurse. Total intra-service moderate  Sedation Time: 28 minutes. The patient's level of consciousness and vital signs were monitored continuously by radiology nursing throughout the procedure under my direct supervision. FLUOROSCOPY TIME:  None COMPLICATIONS: None immediate. PROCEDURE: Informed written consent was obtained from the patient after a thorough discussion of the procedural risks, benefits and alternatives. All questions were addressed. A timeout was performed prior to the initiation of the procedure.  Patient was placed supine on CT scanner. Images through the abdomen were obtained. Lesion in the right hepatic lobe was identified and targeted. Liver lesion was also evaluated with ultrasound. The right side of the abdomen was prepped with chlorhexidine  and sterile field was created. Skin was anesthetized using 1% lidocaine . Using ultrasound guidance, needle was directed towards the lesion and CT images confirmed appropriate positioning with CT. Using ultrasound guidance, 17 gauge coaxial needle was directed into the lesion and core biopsies were obtained with an 18 gauge device. Gel-Foam slurry was injected after the biopsies. Specimens placed in formalin. Follow up CT images were obtained. Bandage placed over the puncture site. RADIATION DOSE REDUCTION: This exam was performed according to the departmental dose-optimization program which includes automated exposure control, adjustment of the mA and/or kV according to patient size and/or use of iterative reconstruction technique. FINDINGS: Hypoechoic lesion in the posterior right hepatic lobe corresponds with the hypermetabolic activity on previous PET-CT. Biopsy needle confirmed within the lesion. Adequate core biopsies obtained. No immediate bleeding or hematoma formation. IMPRESSION: Image guided core biopsies of a right hepatic lesion. Electronically Signed   By: Juliene Balder M.D.   On: 02/04/2024 20:23   NM PET Image Restage (PS) Skull Base to Thigh (F-18 FDG) Result Date:  01/25/2024 CLINICAL DATA:  Subsequent treatment strategy for metastatic colon cancer. Prior thermal ablation of hepatic metastatic disease. EXAM: NUCLEAR MEDICINE PET SKULL BASE TO THIGH TECHNIQUE: 10.2 mCi F-18 FDG was injected intravenously. Full-ring PET imaging was performed from the skull base to thigh after the radiotracer. CT data was obtained and used for attenuation correction and anatomic localization. Fasting blood glucose: 139 mg/dl COMPARISON:  Multiple exams, including 07/19/2023 FINDINGS: Mediastinal blood pool activity: SUV max 2.2 Liver activity: SUV max NA NECK: No significant abnormal hypermetabolic activity in this region. Incidental CT findings: Chronic left maxillary sinusitis. Left mastoid effusion. Suspected left otitis media. CHEST: Scattered hypermetabolic pulmonary nodules are new compared to the prior chest CT but were shown on the diagnostic CT from 01/11/2024. Index right lower lobe lesion 1.2 cm in diameter with maximum SUV 3.2 on image 71 series 202. Index left upper lobe lesion 0.9 cm in diameter on image 51 series 202 with maximum SUV 3.4. Incidental CT findings: Coronary, aortic arch, and branch vessel atherosclerotic vascular disease. ABDOMEN/PELVIS: 4.9 cm hypermetabolic right hepatic lobe mass with maximum SUV 16.3. Small satellite 1.6 cm right hepatic lobe hypermetabolic nodule observed with maximum SUV 9.9. Both of these are visible on image 93 series 194. On the prior PET scan, the right hepatic lobe lesion measured 3.6 cm in diameter with maximum SUV of 11.6. Photopenic site inferiorly in the right hepatic lobe likely from prior ablation. Scattered physiologic activity in bowel. Incidental CT findings: Atherosclerosis is present, including aortoiliac atherosclerotic disease. Pneumobilia. Photopenic bilateral renal cysts merit no further imaging workup. Ventral hernia containing small bowel above the umbilicus. No findings of obstruction or strangulation. Diverticulosis in  the remaining colon.  Prostatomegaly. SKELETON: No significant abnormal hypermetabolic activity in this region. Incidental CT findings: None. IMPRESSION: 1. Hypermetabolic right hepatic lobe masses, compatible with metastatic disease. The larger lesion has increased in size and metabolic activity compared to the prior PET scan. There is also a new smaller satellite right hepatic lobe lesion which is hypermetabolic. 2. Scattered hypermetabolic pulmonary nodules, new compared to the prior chest CT but shown on the diagnostic CT from 01/11/2024. These are compatible with pulmonary metastases. 3. Chronic left maxillary sinusitis. Left mastoid effusion.  Suspected left otitis media. 4. Ventral hernia containing small bowel above the umbilicus, without findings of obstruction or strangulation. 5. Prostatomegaly. 6.  Aortic Atherosclerosis (ICD10-I70.0). Electronically Signed   By: Ryan Salvage M.D.   On: 01/25/2024 14:30    Labs:  CBC: Recent Labs    10/11/23 0522 11/14/23 1317 01/11/24 1138 02/04/24 0750  WBC 7.8 7.8 5.7 7.7  HGB 10.6* 11.2* 11.8* 12.4*  HCT 32.5* 34.0* 34.5* 37.3*  PLT 151 181 171 184    COAGS: Recent Labs    09/07/23 1322 09/17/23 0428 09/19/23 1819 09/21/23 0624 10/10/23 0722 02/04/24 0750  INR 1.2 1.6*  --   --  1.1 1.0  APTT 32  --  77* 120*  --   --     BMP: Recent Labs    10/08/23 0905 10/10/23 0722 11/14/23 1317 01/11/24 1138  NA 138 139 140 136  K 4.1 3.8 3.9 4.4  CL 106 108 105 106  CO2 23 21* 23 19*  GLUCOSE 147* 139* 105* 183*  BUN 27* 28* 26* 39*  CALCIUM  9.1 9.1 9.1 9.0  CREATININE 1.30* 1.49* 1.21 1.48*  GFRNONAA 53* 45* 58* 46*    LIVER FUNCTION TESTS: Recent Labs    10/10/23 0722 10/17/23 1438 11/14/23 1317 01/11/24 1138  BILITOT 2.0* 1.0 1.2 1.2  AST 37 61* 36 60*  ALT 42 65* 39 68*  ALKPHOS 167* 219* 153* 158*  PROT 6.5 6.7 6.8 7.1  ALBUMIN  3.3* 2.9* 3.4* 3.6    TUMOR MARKERS: CEA on 6/13: 36.9  Assessment and  Plan:  I met with Herbert Wood and his wife by videoconferencing. We reviewed imaging studies and biopsy results. The recent liver biopsy is consistent with recurrent metastatic colonic adenocarcinoma. Molecular studies are pending. Recent CT and PET are consistent with progressive metastatic disease in both lungs. Recurrence in the liver above the area of prior ablation x 2 is too large for repeat ablation. Given progressive disease in the lungs, I recommended they discuss whether any systemic therapy may be an option with Dr. Katragadda who they are following up with next week. Future radioembolization with Y-90 to treat the liver may be an option, but would not address the pulmonary metastatic disease. SBRT may also be an option for local control in the liver, given current localized nature of recurrent disease in the liver.   Electronically Signed: Marcey ONEIDA Moan 02/18/2024, 12:54 PM    I spent a total of  15 Minutes in remote  clinical consultation, greater than 50% of which was counseling/coordinating care for metastatic colorectal cancer.    Visit type: Audio and video Product/process development scientist).   Alternative for in-person consultation at [facility], [address] [place], [place], [place]. This visit type was conducted due to national recommendations for restrictions regarding the COVID-19 Pandemic (e.g. social distancing).  This format is felt to be most appropriate for this patient at this time.  All issues noted in this document were discussed and addressed.

## 2024-02-19 ENCOUNTER — Encounter (HOSPITAL_COMMUNITY): Payer: Self-pay

## 2024-02-26 ENCOUNTER — Telehealth: Payer: Self-pay | Admitting: Pharmacist

## 2024-02-26 ENCOUNTER — Other Ambulatory Visit: Payer: Self-pay

## 2024-02-26 ENCOUNTER — Other Ambulatory Visit (HOSPITAL_COMMUNITY): Payer: Self-pay

## 2024-02-26 ENCOUNTER — Telehealth: Payer: Self-pay | Admitting: Pharmacy Technician

## 2024-02-26 ENCOUNTER — Inpatient Hospital Stay: Attending: Hematology | Admitting: Hematology

## 2024-02-26 ENCOUNTER — Inpatient Hospital Stay

## 2024-02-26 ENCOUNTER — Encounter (HOSPITAL_COMMUNITY): Payer: Self-pay | Admitting: Hematology

## 2024-02-26 ENCOUNTER — Encounter: Payer: Self-pay | Admitting: Hematology

## 2024-02-26 VITALS — BP 135/59 | HR 50 | Temp 98.1°F | Resp 19 | Wt 194.2 lb

## 2024-02-26 DIAGNOSIS — D631 Anemia in chronic kidney disease: Secondary | ICD-10-CM | POA: Insufficient documentation

## 2024-02-26 DIAGNOSIS — C787 Secondary malignant neoplasm of liver and intrahepatic bile duct: Secondary | ICD-10-CM | POA: Diagnosis not present

## 2024-02-26 DIAGNOSIS — Z86718 Personal history of other venous thrombosis and embolism: Secondary | ICD-10-CM | POA: Insufficient documentation

## 2024-02-26 DIAGNOSIS — M545 Low back pain, unspecified: Secondary | ICD-10-CM | POA: Insufficient documentation

## 2024-02-26 DIAGNOSIS — Z7901 Long term (current) use of anticoagulants: Secondary | ICD-10-CM | POA: Insufficient documentation

## 2024-02-26 DIAGNOSIS — C189 Malignant neoplasm of colon, unspecified: Secondary | ICD-10-CM

## 2024-02-26 DIAGNOSIS — C184 Malignant neoplasm of transverse colon: Secondary | ICD-10-CM | POA: Insufficient documentation

## 2024-02-26 DIAGNOSIS — C187 Malignant neoplasm of sigmoid colon: Secondary | ICD-10-CM

## 2024-02-26 LAB — CBC WITH DIFFERENTIAL/PLATELET
Abs Immature Granulocytes: 0.03 K/uL (ref 0.00–0.07)
Basophils Absolute: 0.1 K/uL (ref 0.0–0.1)
Basophils Relative: 1 %
Eosinophils Absolute: 0.2 K/uL (ref 0.0–0.5)
Eosinophils Relative: 3 %
HCT: 36.2 % — ABNORMAL LOW (ref 39.0–52.0)
Hemoglobin: 12.2 g/dL — ABNORMAL LOW (ref 13.0–17.0)
Immature Granulocytes: 1 %
Lymphocytes Relative: 17 %
Lymphs Abs: 1.1 K/uL (ref 0.7–4.0)
MCH: 34.2 pg — ABNORMAL HIGH (ref 26.0–34.0)
MCHC: 33.7 g/dL (ref 30.0–36.0)
MCV: 101.4 fL — ABNORMAL HIGH (ref 80.0–100.0)
Monocytes Absolute: 0.6 K/uL (ref 0.1–1.0)
Monocytes Relative: 9 %
Neutro Abs: 4.6 K/uL (ref 1.7–7.7)
Neutrophils Relative %: 69 %
Platelets: 148 K/uL — ABNORMAL LOW (ref 150–400)
RBC: 3.57 MIL/uL — ABNORMAL LOW (ref 4.22–5.81)
RDW: 13.5 % (ref 11.5–15.5)
WBC: 6.7 K/uL (ref 4.0–10.5)
nRBC: 0 % (ref 0.0–0.2)

## 2024-02-26 LAB — COMPREHENSIVE METABOLIC PANEL WITH GFR
ALT: 58 U/L — ABNORMAL HIGH (ref 0–44)
AST: 42 U/L — ABNORMAL HIGH (ref 15–41)
Albumin: 3.6 g/dL (ref 3.5–5.0)
Alkaline Phosphatase: 194 U/L — ABNORMAL HIGH (ref 38–126)
Anion gap: 8 (ref 5–15)
BUN: 37 mg/dL — ABNORMAL HIGH (ref 8–23)
CO2: 24 mmol/L (ref 22–32)
Calcium: 9.1 mg/dL (ref 8.9–10.3)
Chloride: 108 mmol/L (ref 98–111)
Creatinine, Ser: 1.48 mg/dL — ABNORMAL HIGH (ref 0.61–1.24)
GFR, Estimated: 46 mL/min — ABNORMAL LOW (ref >60)
Glucose, Bld: 120 mg/dL — ABNORMAL HIGH (ref 70–99)
Potassium: 4.2 mmol/L (ref 3.5–5.1)
Sodium: 140 mmol/L (ref 135–145)
Total Bilirubin: 1.2 mg/dL (ref 0.0–1.2)
Total Protein: 6.8 g/dL (ref 6.5–8.1)

## 2024-02-26 LAB — FERRITIN: Ferritin: 154 ng/mL (ref 24–336)

## 2024-02-26 LAB — IRON AND TIBC
Iron: 99 ug/dL (ref 45–182)
Saturation Ratios: 31 % (ref 17.9–39.5)
TIBC: 325 ug/dL (ref 250–450)
UIBC: 226 ug/dL

## 2024-02-26 LAB — VITAMIN B12: Vitamin B-12: 1071 pg/mL — ABNORMAL HIGH (ref 180–914)

## 2024-02-26 LAB — FOLATE: Folate: 31.1 ng/mL (ref >5.9)

## 2024-02-26 MED ORDER — CAPECITABINE 500 MG PO TABS: 1000.0000 mg | ORAL_TABLET | Freq: Two times a day (BID) | ORAL | 1 refills | Status: DC

## 2024-02-26 MED ORDER — PROCHLORPERAZINE MALEATE 10 MG PO TABS
10.0000 mg | ORAL_TABLET | Freq: Four times a day (QID) | ORAL | 1 refills | Status: AC | PRN
Start: 2024-02-26 — End: ?

## 2024-02-26 MED ORDER — CAPECITABINE 500 MG PO TABS
1000.0000 mg | ORAL_TABLET | Freq: Two times a day (BID) | ORAL | 1 refills | Status: DC
  Filled 2024-02-26: qty 56, 21d supply, fill #0
  Filled 2024-03-12 (×2): qty 56, 21d supply, fill #1

## 2024-02-26 NOTE — Progress Notes (Signed)
 [facility] [address], [place] [address]    Clinic Day:  02/26/2024  Referring physician: Meade Bigness, MD  Patient Care Team: Meade Bigness, MD as PCP - General (Internal Medicine) Celestia Joesph SQUIBB, RN as Oncology Nurse Navigator (Medical Oncology) Rogers Hai, MD as Medical Oncologist (Medical Oncology)   ASSESSMENT & PLAN:   Assessment: 1. Stage II (T3N0) sigmoid colon cancer, MMR preserved: - Anemia with hemoglobin of 8, recent admission with diverticulitis.  He also had weight loss of 40 pounds in the last 6 months. - Colonoscopy by Dr. Eartha (01/10/2022): Fungating and ulcerated partially obstructing mass found at 42 cm proximal to the anus.  Mass is circumferential and measured 3 cm in length. - Pathology: Invasive moderately differentiated adenocarcinoma of the descending colon mass.  Transverse colon polypectomy shows fragments of at least intramucosal adenocarcinoma and high-grade dysplasia. - CT CAP (01/13/2022): No evidence of metastatic disease in the chest, abdomen or pelvis.  Several tiny lung nodules and scattered areas of interstitial irregularity in the right upper lobe likely postinflammatory. - Preoperative CEA (01/10/2022): 7.9. - Left hemicolectomy (02/17/2022): Moderately differentiated grade 2 adenocarcinoma, no perforation, no lymphovascular/perineural invasion, margins negative.  0/21 lymph nodes involved.  PT3 pN0. - PET scan on 11/09/2022: 3.2 x 2.3 cm right hepatic lobe metastasis. - MRI of the liver on 11/22/2022: Isolated inferior right hepatic lobe 2.8 cm. - Right lobe lesion thermal ablation on 12/27/2022. - Percutaneous microwave thermal ablation of the right lobe liver mass on 10/10/2023 - Liver lesion biopsy (02/04/2024): Metastatic colon cancer. - NGS: KRAS G 13D, MS-stable, TMB-low, HER2 (0), ATM, SMAD4, APC - Guardant360: KRAS G 13D, K-ras K117N, ATM [id number].  UG T1A1 *28/*28 for metabolizer.   2. Social/family  history: - Lives at home with his wife.  Wife works at [employer] nephrology office in [place].  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  Quit smoking in 1995.  Smoked 1 to 2 packs/day for 25 years. - Son had DVT.  Son is also a carrier for factor V Leiden.  Mother had bladder cancer and uterine cancer.   3.  Left leg DVT: - Patient had a left leg DVT approximately 2 years ago.  His wife thinks that DVT has happened about 4 to 6 weeks after his second COVID infection.  He was very minimally symptomatic from the COVID infection.  At the time of the DVT, he was completely mobile and did not have any surgery. - Patient was heterozygous for factor V Leiden as checked by Dr. Devonda on 09/20/2011. - I have recommended that he get the records of timing of DVT and the COVID test.  If it is within 4 to 6 weeks of his COVID diagnosis, we may safely discontinue anticoagulation considering it can be provoked by COVID infection.  If it is unprovoked DVT, may continue low intensity anticoagulation.   4.  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 - Adjuvant XRT 50 Gray, completed 08/11/2015.    Plan: 1.  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  Scattered lung nodules which are hypermetabolic.  No other evidence of metastatic disease. - We reviewed biopsy results of the liver as well. - We discussed NGS test results as well as Guardant360 results.  No targetable mutations. -  We discussed palliative chemotherapy with FOLFOX and bevacizumab.  Because of his advanced age, we have discussed alternative with Xeloda  and bevacizumab.  We have also discussed best supportive care in the form of hospice.  He would like to try treatment with Xeloda  and bevacizumab.  If he does not tolerate, we would  stop treatments and refer him to SBRT for the liver lesion to slow down progression.  He wants radiation to be done close to home in [place]. - We will start with Xeloda  1000 mg twice daily 2 weeks on/1 week off on bevacizumab 7.5 mg/kg every 3 weeks.  We discussed side effects including GI side effects, hand-foot skin syndrome and cytopenias. - We will start his treatment next week. - Because of the findings on NGS, I have recommended genetic testing.    2.  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  Hemoglobin today is 12.2.  Ferritin is 154 iron saturation 31.  B12 and folic acid  were normal.   3.  Left leg DVT: - Continue Eliquis  daily.  No bleeding issues reported.  4.  Low back pain: - Continue tramadol  as needed.    Orders Placed This Encounter  Procedures   Genetic Screening Order    Standing Status:   Future    Expected Date:   03/28/2024    Expiration Date:   02/25/2025   CBC with Differential    Standing Status:   Future    Number of Occurrences:   1    Expected Date:   02/26/2024    Expiration Date:   05/26/2024   Comprehensive metabolic panel    Standing Status:   Future    Number of Occurrences:   1    Expected Date:   02/26/2024    Expiration Date:   05/26/2024   Iron and TIBC ([facility] ONLY)    Standing Status:   Future    Number of Occurrences:   1    Expected Date:   02/26/2024    Expiration Date:   05/26/2024   Vitamin B12    Standing Status:   Future    Number of Occurrences:   1    Expected Date:   02/26/2024    Expiration Date:   05/26/2024   Ferritin    Standing Status:   Future    Number of Occurrences:   1    Expected Date:   02/26/2024    Expiration Date:   05/26/2024   Folate    Standing Status:   Future    Number of Occurrences:   1    Expected Date:   02/26/2024    Expiration Date:   05/26/2024   CBC with Differential    Standing Status:   Future    Expected Date:   03/04/2024    Expiration Date:    03/04/2025   Urinalysis, dipstick only    Standing Status:   Future    Expected Date:   03/04/2024    Expiration Date:   03/04/2025      LILLETTE Hummingbird R Teague,acting as a scribe for Alean Stands, MD.,have documented all relevant documentation on the behalf of Alean Stands, MD,as directed by  Alean Stands, MD while in the presence of Alean Stands, MD.  I, Alean Stands MD, have reviewed the above documentation for accuracy and completeness, and I agree with the above.      Alean Stands, MD   7/29/20254:05 PM  CHIEF COMPLAINT:   Diagnosis:  sigmoid colon cancer and iron deficiency anemia    Cancer Staging  Cancer of sigmoid colon [facility]) Staging form: Colon and Rectum, AJCC 8th Edition - Clinical stage from 01/25/2022: Stage IIA (cT3, cN0, cM0) - Signed by Rogers Hai, MD on 03/16/2022 - Pathologic stage from 01/17/2024: Stage IVB (rpTX, rpNX, rcM1b) - Signed by Rogers Hai, MD on 01/17/2024    Prior Therapy: 1. Left hemicolectomy on 02/17/2022  2. Microwave ablation of liver metastasis, 12/27/22  Current Therapy:  Surveillance    HISTORY OF PRESENT ILLNESS:   Oncology History  Cancer of sigmoid colon (HCC)  01/25/2022 Initial Diagnosis   Cancer of sigmoid colon (HCC)   01/25/2022 Cancer Staging   Staging form: Colon and Rectum, AJCC 8th Edition - Clinical stage from 01/25/2022: Stage IIA (cT3, cN0, cM0) - Signed by Rogers Hai, MD on 03/16/2022 Histopathologic type: Adenocarcinoma, NOS Stage prefix: Initial diagnosis Total positive nodes: 0 Total nodes examined: 21 Histologic grade (G): G2 Histologic grading system: 4 grade system Microsatellite instability (MSI): Stable   01/17/2024 Cancer Staging   Staging form: Colon and Rectum, AJCC 8th Edition - Pathologic stage from 01/17/2024: Stage IVB (rpTX, rpNX, rcM1b) - Signed by Rogers Hai, MD on 01/17/2024 Stage prefix: Recurrence   03/04/2024 -  Chemotherapy    Patient is on Treatment Plan : COLORECTAL Capecitabine  D1-14 + Bevacizumab D1 q21d     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)  12/27/2022 Initial Diagnosis   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)   03/04/2024 -  Chemotherapy   Patient is on Treatment Plan : COLORECTAL Capecitabine  D1-14 + Bevacizumab D1 q21d        INTERVAL HISTORY:   Herbert Wood is a 89 y.o. male presenting to clinic today for follow up of sigmoid colon cancer and iron deficiency anemia. He was last seen by me on 01/17/2024.  Since his last visit, he underwent biopsy of the right lobe of the liver on 02/04/2024. Pathology of the biopsy revealed: Adenocarcinoma consistent with recurrent metastatic colonic adenocarcinoma.   Restaging PET done on 01/24/2024 showed: Hypermetabolic right hepatic lobe masses, compatible with metastatic disease. The larger lesion has increased in size and metabolic activity compared to the prior PET scan. There is also a new smaller satellite right hepatic lobe lesion which is hypermetabolic. Scattered hypermetabolic pulmonary nodules, new compared to the prior chest CT but shown on the diagnostic CT from 01/11/2024. These are compatible with pulmonary metastases. Chronic left maxillary sinusitis. Left mastoid effusion. Suspected left otitis media. Ventral hernia containing small bowel above the umbilicus, without findings of obstruction or strangulation. Prostatomegaly. Aortic Atherosclerosis.  Today, he states that he is doing well overall. His appetite level is at 100%. His energy level is at 0%. Herbert Wood is accompanied by his wife.   He denies being in any pain. His wife states Herbert Wood's heart rate has been in the 40's since 02/22/2024. He did not take coreg  over the weekend and then started taking half a pill of Coreg  BID on 02/25/2024. Since cutting his Coreg  dosage, his heart rate has improved to the 50's. He notes associated fatigue that is not new in onset. His wife also states Herbert Wood has lost weight  recently, which could be contributing to hypotension. He denies any chest pain. He does report dizziness when standing, though this is not new in onset. He has an appointment with cardiology in September 2025. Herbert Wood denies any history of MI's, CVA's, or TIA's, as well as any major cardiology issues.   Bright  would like to proceed with Xeloda  and Avastin for treatment of progressing metastasis.   PAST MEDICAL HISTORY:   Past Medical History: Past Medical History:  Diagnosis Date   Adenomatous colon polyp    Anemia    Carotid bruit    Chronic dyspnea    Diabetes (HCC)    type 2   Diverticular disease    DVT (deep venous thrombosis) (HCC)    lower limb   Dyspnea    Factor V deficiency (HCC)    GERD (gastroesophageal reflux disease)    Hearing loss    bilateral   Hyperlipemia    Hypertension    Inguinal hernia    Kidney disease    stage 3   Lung nodule    Lymphadenopathy    Malignant neoplasm (HCC)    salivary gland   Murmur    Osteoarthritis    Senile purpura (HCC)     Surgical History: Past Surgical History:  Procedure Laterality Date   BIOPSY  01/10/2022   Procedure: BIOPSY;  Surgeon: Eartha Angelia Sieving, MD;  [facility];  Service: Gastroenterology;;   COLON SURGERY     COLONOSCOPY WITH PROPOFOL  N/A 01/10/2022   Procedure: COLONOSCOPY WITH PROPOFOL ;  Surgeon: Eartha Angelia Sieving, MD;  Location: [facility];  Service: Gastroenterology;  Laterality: N/A;  per Anette Caldron, pt to arrive at 9:15   COLONOSCOPY WITH PROPOFOL  N/A 02/27/2023   Procedure: COLONOSCOPY WITH PROPOFOL ;  Surgeon: Eartha Angelia Sieving, MD;  Location: [facility];  Service: Gastroenterology;  Laterality: N/A;  9:15am;asa 3   ENDOSCOPIC RETROGRADE CHOLANGIOPANCREATOGRAPHY (ERCP) WITH PROPOFOL  N/A 09/20/2023   Procedure: ENDOSCOPIC RETROGRADE CHOLANGIOPANCREATOGRAPHY (ERCP) WITH PROPOFOL ;  Surgeon: Charlanne Groom, MD;  Location[facility] ENDOSCOPY;  Service: Gastroenterology;   Laterality: N/A;   ESOPHAGOGASTRODUODENOSCOPY (EGD) WITH PROPOFOL  N/A 01/10/2022   Procedure: ESOPHAGOGASTRODUODENOSCOPY (EGD) WITH PROPOFOL ;  Surgeon: Eartha Angelia Sieving, MD;  [facility];  Service: Gastroenterology;  Laterality: N/A;   HAND / FINGER LESION EXCISION     HERNIA REPAIR     IR RADIOLOGIST EVAL & MGMT  04/04/2023   IR RADIOLOGIST EVAL & MGMT  08/09/2023   IR RADIOLOGIST EVAL & MGMT  02/18/2024   MASTOIDECTOMY     PILONIDAL CYST EXCISION     POLYPECTOMY  01/10/2022   Procedure: POLYPECTOMY;  Surgeon: Eartha Angelia Sieving, MD;  [facility]: [facility];  Service: Gastroenterology;;   POLYPECTOMY  02/27/2023   Procedure: POLYPECTOMY;  Surgeon: Eartha Angelia Sieving, MD;  [facility]: [facility];  Service: Gastroenterology;;   RADIOLOGY WITH ANESTHESIA N/A 12/27/2022   Procedure: CT microwave ablation of the liver;  Surgeon: Luverne Aran, MD;  Location: [facility];  Service: Radiology;  Laterality: N/A;   RADIOLOGY WITH ANESTHESIA N/A 10/10/2023   Procedure: CT WITH ANESTHESIA MICROWAVE ABLATION OF LIVER;  Surgeon: Luverne Aran, MD;  Location: [facility];  Service: Radiology;  Laterality: N/A;   REMOVAL OF STONES  09/20/2023   Procedure: REMOVAL OF STONES;  Surgeon: Charlanne Groom, MD;  Location: [facility] ENDOSCOPY;  Service: Gastroenterology;;   ANNETT  09/20/2023   Procedure: ANNETT;  Surgeon: Charlanne Groom, MD;  Location: [facility] ENDOSCOPY;  Service: Gastroenterology;;   ANNETT  09/20/2023   Procedure: OLEVIA;  Surgeon: Charlanne Groom, MD;  Location: [facility] ENDOSCOPY;  Service: Gastroenterology;;   SUBMUCOSAL TATTOO INJECTION  01/10/2022   Procedure: SUBMUCOSAL TATTOO INJECTION;  Surgeon: Eartha Angelia Sieving, MD;  [facility];  Service: Gastroenterology;;   SURGERY OF LIP  skin cancer    Social History: Social History   Socioeconomic History   Marital status: Married    Spouse name: Not on file   Number of  children: Not on file   Years of education: Not on file   Highest education level: Not on file  Occupational History   Not on file  Tobacco Use   Smoking status: Former    Current packs/day: 0.00    Average packs/day: 2.0 packs/day for 25.0 years (50.0 ttl pk-yrs)    Types: Cigarettes    Start date: 58    Quit date: 44    Years since quitting: 30.5    Passive exposure: Past   Smokeless tobacco: Never  Vaping Use   Vaping status: Never Used  Substance and Sexual Activity   Alcohol use: Never   Drug use: Never   Sexual activity: Not on file  Other Topics Concern   Not on file  Social History Narrative   Not on file   Social Drivers of Health   Financial Resource Strain: Not on file  Food Insecurity: No Food Insecurity (10/10/2023)   Hunger Vital Sign    Worried About Running Out of Food in the Last Year: Never true    Ran Out of Food in the Last Year: Never true  Transportation Needs: No Transportation Needs (10/10/2023)   PRAPARE - Administrator, Civil Service (Medical): No    Lack of Transportation (Non-Medical): No  Physical Activity: Not on file  Stress: Not on file  Social Connections: Moderately Integrated (10/10/2023)   Social Connection and Isolation Panel    Frequency of Communication with Friends and Family: More than three times a week    Frequency of Social Gatherings with Friends and Family: Once a week    Attends Religious Services: More than 4 times per year    Active Member of [employer] or Organizations: No    Attends Banker Meetings: Never    Marital Status: Married  Catering manager Violence: Not At Risk (10/10/2023)   Humiliation, Afraid, Rape, and Kick questionnaire    Fear of Current or Ex-Partner: No    Emotionally Abused: No    Physically Abused: No    Sexually Abused: No    Family History: No family history on file.  Current Medications:  Current Outpatient Medications:    amitriptyline  (ELAVIL ) 10 MG tablet,  Take 10 mg by mouth every evening., Disp: , Rfl:    amLODipine  (NORVASC ) 5 MG tablet, Take 5 mg by mouth 2 (two) times daily., Disp: , Rfl:    ascorbic acid  (VITAMIN C /NATURAL ROSE HIPS) 1000 MG tablet, Take 1,000 mg by mouth every evening., Disp: , Rfl:    atorvastatin  (LIPITOR) 40 MG tablet, Take 40 mg by mouth every evening., Disp: , Rfl:    carvedilol  (COREG ) 6.25 MG tablet, Take 6.25 mg by mouth 2 (two) times daily with a meal., Disp: , Rfl:    Cholecalciferol  (VITAMIN D3) 125 MCG (5000 UT) CAPS, Take 5,000 Units by mouth every evening., Disp: , Rfl:    dorzolamide -timolol (COSOPT) 2-0.5 % ophthalmic solution, Place 1 drop into both eyes 2 (two) times daily., Disp: , Rfl:    ELIQUIS  5 MG TABS tablet, TAKE 1 TABLET(5 MG) BY MOUTH TWICE DAILY, Disp: 60 tablet, Rfl: 3   ferrous sulfate  325 (65 FE) MG tablet, Take 325 mg by mouth daily with breakfast., Disp: , Rfl:    folic acid  (FOLVITE ) 1 MG tablet, Take 1  tablet (1 mg total) by mouth every evening., Disp: 30 tablet, Rfl: 0   hydrALAZINE  (APRESOLINE ) 25 MG tablet, Take 50 mg by mouth in the morning and at bedtime., Disp: , Rfl:    KERENDIA  10 MG TABS, Take 10 mg by mouth daily., Disp: , Rfl:    latanoprost  (XALATAN ) 0.005 % ophthalmic solution, Place 1 drop into both eyes at bedtime., Disp: , Rfl:    linagliptin  (TRADJENTA ) 5 MG TABS tablet, Take 5 mg by mouth in the morning., Disp: , Rfl:    loratadine  (CLARITIN ) 10 MG tablet, Take 10 mg by mouth every morning., Disp: , Rfl:    Misc Natural Products (PROSTATE SUPPORT PO), Take 1 capsule by mouth every evening. NOW Prostate Support, Disp: , Rfl:    omeprazole  (PRILOSEC) 40 MG capsule, Take 1 capsule (40 mg total) by mouth daily., Disp: 90 capsule, Rfl: 3   ondansetron  (ZOFRAN ) 4 MG tablet, Take 1 tablet (4 mg total) by mouth every 6 (six) hours as needed for nausea., Disp: 20 tablet, Rfl: 0   ondansetron  (ZOFRAN -ODT) 4 MG disintegrating tablet, 4mg  ODT q4 hours prn nausea/vomit, Disp: 10 tablet,  Rfl: 0   traMADol  (ULTRAM ) 50 MG tablet, Take 1 tablet (50 mg total) by mouth at bedtime as needed., Disp: 30 tablet, Rfl: 0   vitamin B-12 (CYANOCOBALAMIN ) 1000 MCG tablet, Take 1,000 mcg by mouth every evening. Now (Brand), Disp: , Rfl:    capecitabine  (XELODA ) 500 MG tablet, Take 2 tablets (1,000 mg total) by mouth 2 (two) times daily after a meal. Take 2 weeks on/ 1 week off, Disp: 56 tablet, Rfl: 1   prochlorperazine  (COMPAZINE ) 10 MG tablet, Take 1 tablet (10 mg total) by mouth every 6 (six) hours as needed for nausea or vomiting., Disp: 30 tablet, Rfl: 1   Allergies: Allergies  Allergen Reactions   Gadobenate Nausea And Vomiting    Immediately upon the infusion of 15mL multihance contrast  Patient had exteme nausea and vomiting.   No other symptoms noted .  No injury.  MRI scan was completed after patient felt better.   Immediately upon the infusion of 15mL multihance contrast  Patient had exteme nausea and vomiting.   No other symptoms noted .  No injury.  MRI scan was completed after patient felt better.       REVIEW OF SYSTEMS:   Review of Systems  Constitutional:  Negative for chills, fatigue and fever.  HENT:   Negative for lump/mass, mouth sores, nosebleeds, sore throat and trouble swallowing.   Eyes:  Negative for eye problems.  Respiratory:  Positive for shortness of breath. Negative for cough.   Cardiovascular:  Negative for chest pain, leg swelling and palpitations.  Gastrointestinal:  Positive for diarrhea. Negative for abdominal pain, constipation, nausea and vomiting.  Genitourinary:  Negative for bladder incontinence, difficulty urinating, dysuria, frequency, hematuria and nocturia.   Musculoskeletal:  Negative for arthralgias, back pain, flank pain, myalgias and neck pain.  Skin:  Negative for itching and rash.  Neurological:  Positive for dizziness. Negative for headaches and numbness.       +tingling in feet  Hematological:  Does not bruise/bleed easily.   Psychiatric/Behavioral:  Negative for depression, sleep disturbance and suicidal ideas. The patient is not nervous/anxious.   All other systems reviewed and are negative.    VITALS:   Blood pressure (!) 135/59, pulse (!) 50, temperature 98.1 F (36.7 C), temperature source Oral, resp. rate 19, weight 194 lb 3.6 oz (88.1 kg), SpO2  97%.  Wt Readings from Last 3 Encounters:  02/26/24 194 lb 3.6 oz (88.1 kg)  02/04/24 190 lb (86.2 kg)  01/17/24 191 lb 9.3 oz (86.9 kg)    Body mass index is 27.87 kg/m.  Performance status (ECOG): 1 - Symptomatic but completely ambulatory  PHYSICAL EXAM:   Physical Exam Vitals and nursing note reviewed. Exam conducted with a chaperone present.  Constitutional:      Appearance: Normal appearance.  Cardiovascular:     Rate and Rhythm: Normal rate and regular rhythm.     Pulses: Normal pulses.     Heart sounds: Normal heart sounds.  Pulmonary:     Effort: Pulmonary effort is normal.     Breath sounds: Normal breath sounds.  Abdominal:     Palpations: Abdomen is soft. There is no hepatomegaly, splenomegaly or mass.     Tenderness: There is no abdominal tenderness.  Musculoskeletal:     Right lower leg: No edema.     Left lower leg: No edema.  Lymphadenopathy:     Cervical: No cervical adenopathy.     Right cervical: No superficial, deep or posterior cervical adenopathy.    Left cervical: No superficial, deep or posterior cervical adenopathy.     Upper Body:     Right upper body: No supraclavicular or axillary adenopathy.     Left upper body: No supraclavicular or axillary adenopathy.  Neurological:     General: No focal deficit present.     Mental Status: He is alert and oriented to person, place, and time.  Psychiatric:        Mood and Affect: Mood normal.        Behavior: Behavior normal.     LABS:      Latest Ref Rng & Units 02/26/2024   11:04 AM 02/04/2024    7:50 AM 01/11/2024   11:38 AM  CBC  WBC 4.0 - 10.5 K/uL 6.7  7.7  5.7    Hemoglobin 13.0 - 17.0 g/dL 87.7  87.5  88.1   Hematocrit 39.0 - 52.0 % 36.2  37.3  34.5   Platelets 150 - 400 K/uL 148  184  171       Latest Ref Rng & Units 02/26/2024   11:04 AM 01/11/2024   11:38 AM 11/14/2023    1:17 PM  CMP  Glucose 70 - 99 mg/dL 879  816  894   BUN 8 - 23 mg/dL 37  39  26   Creatinine 0.61 - 1.24 mg/dL 8.51  8.51  8.78   Sodium 135 - 145 mmol/L 140  136  140   Potassium 3.5 - 5.1 mmol/L 4.2  4.4  3.9   Chloride 98 - 111 mmol/L 108  106  105   CO2 22 - 32 mmol/L 24  19  23    Calcium  8.9 - 10.3 mg/dL 9.1  9.0  9.1   Total Protein 6.5 - 8.1 g/dL 6.8  7.1  6.8   Total Bilirubin 0.0 - 1.2 mg/dL 1.2  1.2  1.2   Alkaline Phos 38 - 126 U/L 194  158  153   AST 15 - 41 U/L 42  60  36   ALT 0 - 44 U/L 58  68  39      Lab Results  Component Value Date   CEA1 36.9 (H) 01/11/2024   /  CEA  Date Value Ref Range Status  01/11/2024 36.9 (H) 0.0 - 4.7 ng/mL Final    Comment:    (  NOTE)                             Nonsmokers          <3.9                             Smokers             <5.6 Roche [employer] Electrochemiluminescence Immunoassay (ECLIA) Values obtained with different assay methods or kits cannot be used interchangeably.  Results cannot be interpreted as absolute evidence of the presence or absence of malignant disease. Performed At: [facility] [address] [place], [place] [id number] Jennette Shorter MD Ey:[id number]    No results found for: PSA1 No results found for: CAN199 No results found for: RJW874  Lab Results  Component Value Date   TOTALPROTELP 6.3 01/25/2022   ALBUMINELP 3.6 01/25/2022   A1GS 0.2 01/25/2022   A2GS 0.7 01/25/2022   BETS 0.9 01/25/2022   GAMS 0.9 01/25/2022   MSPIKE Not Observed 01/25/2022   SPEI Comment 01/25/2022   Lab Results  Component Value Date   TIBC 325 02/26/2024   TIBC 322 10/08/2023   TIBC 290 08/02/2023   FERRITIN 154 02/26/2024   FERRITIN 168 10/08/2023   FERRITIN 142  08/02/2023   IRONPCTSAT 31 02/26/2024   IRONPCTSAT 33 10/08/2023   IRONPCTSAT 31 08/02/2023   No results found for: LDH   STUDIES:   IR Radiologist Eval & Mgmt Result Date: 02/18/2024 CLINICAL DATA:  IR clinic follow-up. EXAM: IR EVAL AND MANAGEMENT COMPARISON:  None Available. FINDINGS: See dictated note in Epic. IMPRESSION: See dictated note in Epic. Electronically Signed   By: Marcey Moan M.D.   On: 02/18/2024 13:17   CT BIOPSY Result Date: 02/04/2024 INDICATION: 89 year old with metastatic colon cancer. Evaluate for new or recurrent liver disease. EXAM: IMAGE GUIDED LIVER LESION BIOPSY WITH CT AND ULTRASOUND GUIDANCE MEDICATIONS: Moderate sedation ANESTHESIA/SEDATION: Moderate (conscious) sedation was employed during this procedure. A total of Versed  2 mg and Fentanyl  75 mcg was administered intravenously by the radiology nurse. Total intra-service moderate Sedation Time: 28 minutes. The patient's level of consciousness and vital signs were monitored continuously by radiology nursing throughout the procedure under my direct supervision. FLUOROSCOPY TIME:  None COMPLICATIONS: None immediate. PROCEDURE: Informed written consent was obtained from the patient after a thorough discussion of the procedural risks, benefits and alternatives. All questions were addressed. A timeout was performed prior to the initiation of the procedure. Patient was placed supine on CT scanner. Images through the abdomen were obtained. Lesion in the right hepatic lobe was identified and targeted. Liver lesion was also evaluated with ultrasound. The right side of the abdomen was prepped with chlorhexidine  and sterile field was created. Skin was anesthetized using 1% lidocaine . Using ultrasound guidance, needle was directed towards the lesion and CT images confirmed appropriate positioning with CT. Using ultrasound guidance, 17 gauge coaxial needle was directed into the lesion and core biopsies were obtained with an 18  gauge device. Gel-Foam slurry was injected after the biopsies. Specimens placed in formalin. Follow up CT images were obtained. Bandage placed over the puncture site. RADIATION DOSE REDUCTION: This exam was performed according to the departmental dose-optimization program which includes automated exposure control, adjustment of the mA and/or kV according to patient size and/or use of iterative reconstruction technique. FINDINGS: Hypoechoic lesion in the posterior right hepatic lobe corresponds with  the hypermetabolic activity on previous PET-CT. Biopsy needle confirmed within the lesion. Adequate core biopsies obtained. No immediate bleeding or hematoma formation. IMPRESSION: Image guided core biopsies of a right hepatic lesion. Electronically Signed   By: Juliene Balder M.D.   On: 02/04/2024 20:23

## 2024-02-26 NOTE — Telephone Encounter (Addendum)
 Clinical Pharmacist Practitioner Encounter   Received new prescription for Xeloda  (capecitabine ) for the treatment of stage IV colon cancer in conjunction with bevacizumab, planned duration until disease progression or unacceptable drug toxicity. Planned start 03/06/24.  CMP from 02/26/24 assessed, SCr 1.48, CrCl ~ 39 ml/min. Prescription dose and frequency assessed. Patient's dose has been renally dose adjustment.   Current medication list in Epic reviewed, a few DDIs with capecitabine  identified: Omeprazole : Proton Pump Inhibitors (PPI) may diminish the therapeutic effect of capecitabine , varying information on the clinical impact. Recommend evaluating the need for a PPI/acid suppression. If acid suppression is needed, attempt switching to a H2 antagonist (eg, famotidine) if possible. Folic acid : Folic Acid  may increase adverse/toxic effects of fluorouracil products. Monitor for increased capecitabine  side effects. Consider stopping folic acid  supplement if patient has trouble tolerating capecitabine .   Evaluated chart and no patient barriers to medication adherence identified.   Prescription has been e-scribed to the [facility] for benefits analysis and approval.  [facility] will continue to follow for insurance authorization, copayment issues, initial counseling and start date.   Marvelle Caudill N. Loula Marcella, PharmD, BCOP, CPP Hematology/Oncology Clinical Pharmacist [facility] [phone]  02/26/2024 12:43 PM

## 2024-02-26 NOTE — Patient Instructions (Addendum)
 [facility] Chemotherapy Teaching  You have been diagnosed with Stage 4 colon cancer.  You will be treated with the immunotherapy Avastin every three weeks.  The intent of treatment is palliative meaning to prevent the spread and manage your symptoms.     You will see the doctor regularly throughout treatment.  We will obtain blood work from you prior to every treatment and monitor your results to make sure it is safe to give your treatment. The doctor monitors your response to treatment by the way you are feeling, your blood work, and by obtaining scans periodically.  There will be wait times while you are here for treatment.  It will take about 30 minutes to 1 hour for your lab work to result.  Then there will be wait times while pharmacy mixes your medications.     Bevacizumab (Avastin)  About This Drug Bevacizumab is used to treat cancer. It is given in the vein (IV).  Possible Side Effects  Teary eyes   Runny/stuffy nose   Nosebleed   Changes in the way food and drinks taste   Headache   Back pain   Protein in your urine   Bleeding in your rectum   Dry skin   A red skin rash which can be peeling or scaling   High blood pressure  Note: Each of the side effects above was reported in 10% or greater of patients treated with bevacizumab-xxxx. Not all possible side effects are included above.  Warnings and Precautions   Perforation or fistula- an abnormal hole in your stomach, intestine, esophagus, or other organ, which can be life-threatening  Slow wound healing, which can be life-threatening  Abnormal bleeding which can be life-threatening - symptoms may be coughing up blood, throwing up blood (may look like coffee grounds), red or black tarry bowel movements, abnormally heavy menstrual flow, nosebleeds or any other unusual bleeding.  Blood clots and events such as stroke and heart attack. A blood clot in your leg may cause your leg to swell, appear red and  warm, and/or cause pain. A blood clot in your lungs may cause trouble breathing, pain when breathing, and/or chest pain.  Severe high blood pressure  Changes in your central nervous system can happen. The central nervous system is made up of your brain and spinal cord. You could feel extreme tiredness, agitation, confusion, hallucinations   This drug may interact with other medicines. Tell your doctor and pharmacist about all the medicines and dietary supplements (vitamins, minerals, herbs and others) that you are taking at this time. Also, check with your doctor or pharmacist before starting any new prescription or over-the-counter medicines, or dietary supplements to make sure that there are no interactions.  When to Call the Doctor Call your doctor or nurse if you have any of these symptoms and/or any new or unusual symptoms:   Fever of 100.4 F (38 C) or higher   Chills   Confusion and/or agitation   Hallucinations   Trouble understanding or speaking   Headache that does not go away   Nose bleed that doesn't stop bleeding after 10 -15 minutes   Feeling dizzy or lightheaded   Blurry vision or changes in your eyesight   Difficulty swallowing   Easy bleeding or bruising   Blood in your urine, vomit (bright red or coffee-ground) and/or stools ( bright red, or black/tarry)   Coughing up blood   Wheezing and/or trouble breathing   Chest pain or symptoms of  a heart attack. Most heart attacks involve pain in the center of the chest that lasts more than a few minutes. The pain may go away and come back. It can feel like pressure, squeezing, fullness, or pain. Sometimes pain is felt in one or both arms, the back, neck, jaw, or stomach. If any of these symptoms last 2 minutes, call 911.   Symptoms of a stroke such as sudden numbness or weakness of your face, arm, or leg, mostly on one side of your body; sudden confusion, trouble speaking or understanding; sudden trouble seeing in one  or both eyes; sudden trouble walking, feeling dizzy, loss of balance or coordination; or sudden, bad headache with no known cause. If you have any of these symptoms for 2 minutes, call 911.   Numbness or lack of strength to your arms, legs, face, or body   Nausea that stops you from eating or drinking and/or relieved by prescribed medicine   Throwing up   Pain in your abdomen that does not go away   Foamy or bubbly-looking urine   Signs of infusion reaction: fever or shaking chills, flushing, facial swelling, feeling dizzy, headache, trouble breathing, rash, itching, chest tightness, or chest pain.   Pain that does not go away or is not relieved by prescribed medicine   Your leg or arm is swollen, red, warm and/or painful   Swelling of arms, hands, legs and/or feet   Weight gain of 5 pounds in one week (fluid retention)   If you think you may be pregnant  Reproduction Warnings  Pregnancy warning: This drug can have harmful effects on the unborn baby. Women of child bearing potential should use effective methods of birth control during your cancer treatment and for 6 months after treatment. In women, changes in your ovaries may happen that may cause menstrual bleeding to become irregular or stop, do not assume you cannot get pregnant. Let your doctor know right away if you think you may be pregnant   Breastfeeding warning: Women should not breastfeed during treatment and for 6 months after treatment because this drug could enter the breast milk and cause harm to a breastfeeding baby.    Fertility warning: In women, this drug may affect your ability to have children in the future. Talk with your doctor or nurse if you plan to have children. Ask for information on egg banking.    Capecitabine  (Xeloda )  About This Drug Capecitabine  is used to treat cancer. It is given orally (by mouth).  You will take this drug for two weeks in a row, then have a one week break before restarting it  again.   Possible Side Effects  Decrease in red blood cells. This may make you feel more tired.   Nausea and throwing up (vomiting)   Pain in your abdomen   Diarrhea (loose bowel movements)   Tiredness and weakness   Increased total bilirubin in your blood. This may mean that you have changes in your liver function.   Hand-foot syndrome. The palms of your hands or soles of your feet may tingle, become numb, painful, swollen, or red.  Note: Each of the side effects above was reported in 30% or greater of patients treated with capecitabine . Not all possible side effects are included above.  Warnings and Precautions  Abnormal bleeding if you are taking blood thinners such as warfarin - symptoms may be coughing up blood, throwing up blood (may look like coffee grounds), red or black tarry bowel movements,  abnormally heavy menstrual flow, nosebleeds or any other unusual bleeding.   Severe diarrhea   Changes in the tissue of the heart and/or heart attack. Some changes may happen that can cause your heart to have less ability to pump blood.   Increased risk of severe side effects if you have a known dihydropyrimidine dehydrogenase deficiency   Dehydration (lack of water in the body from losing too much fluid), which may affect how your kidneys work which can be life-threatening   Severe allergic skin reaction. You may develop blisters on your skin that are filled with fluid or a severe red rash all over your body that may be painful.   Decrease in the number of white blood cells, red blood cells, and platelets. This may raise your risk of infection, make you tired and weak (fatigue), and raise your risk of bleeding.   Patients greater than 12 years of age are at increased risk of severe and life-threatening side effects.   Increased bilirubin and changes in your liver function, which can cause liver failure  Note: Some of the side effects above are very rare. If you have concerns  and/or questions, please discuss them with your medical team.  How to Take Your Medication  Swallow the medicine whole with water within 30 minutes after a meal. Do not break or crush it.   Missed dose: If you vomit or miss a dose, take your next dose at the regular time, and contact your doctor. Do not take 2 doses at the same time and do not double up on the next dose.   Handling: Wash your hands after handling your medicine, your caretakers should not handle your medicine with bare hands and should wear latex gloves.  This drug may be present in the saliva, tears, sweat, urine, stool, vomit, semen, and vaginal secretions. Talk to your doctor and/or your nurse about the necessary precautions to take during this time.   Storage: Store this medicine in the original container at room temperature. Keep lid tightly closed.   Disposal of unused medicine: Do not flush any expired and/or unused medicine down the toilet or drain unless you are specifically instructed to do so on the medication label. Some facilities have take-back programs and/or other options. If you do not have a take-back program in your area, then please discuss with your nurse or your doctor how to dispose of unused medicine.  Treating Side Effects  Drink plenty of fluids (a minimum of eight glasses per day is recommended).   If you throw up or have loose bowel movements, you should drink more fluids so that you do not become dehydrated (lack of water in the body from losing too much fluid).   If you have diarrhea, eat low-fiber foods that are high in protein and calories and avoid foods that can irritate your digestive tracts or lead to cramping.   Ask your nurse or doctor about medicine that can lessen or stop your diarrhea.   To help with nausea and vomiting, eat small, frequent meals instead of three large meals a day. Choose foods and drinks that are at room temperature. Ask your nurse or doctor about other helpful tips and  medicine that is available to help stop or lessen these symptoms.   Manage tiredness by pacing your activities for the day.   Be sure to include periods of rest between energy-draining activities.   To decrease the risk of infection, wash your hands regularly.   Avoid close  contact with people who have a cold, the flu, or other infections.   Take your temperature as your doctor or nurse tells you, and whenever you feel like you may have a fever.   To help decrease the risk of bleeding, use a soft toothbrush. Check with your nurse before using dental floss.   Be very careful when using knives or tools.   Use an electric shaver instead of a razor.   Keeping your pain under control is important to your well-being. Please tell your doctor or nurse if you are experiencing pain.   Avoid sun exposure and apply sunscreen routinely when outdoors.   If you get a rash do not put anything on it unless your doctor or nurse says you may. Keep the area around the rash clean and dry. Ask your doctor for medicine if your rash bothers you.  Food and Drug Interactions  There are no known interactions of capecitabine  with food, however this medication should be taken within 30 minutes after a meal.   Check with your doctor or pharmacist about all other prescription medicines and over-the-counter medicines and dietary supplements (vitamins, minerals, herbs and others) you are taking before starting this medicine as there are known drug interactions with capecitabine . Also, check with your doctor or pharmacist before starting any new prescription or over-the-counter medicines, or dietary supplements to make sure that there are no interactions.    There are known interactions of capecitabine  with blood thinning medicine such as warfarin. Ask your doctor what precautions you should take.  When to Call the Doctor  Call your doctor or nurse if you have any of these symptoms and/or any new or unusual  symptoms:  Fever of 100.4 F (38 C) or higher   Chills   Trouble breathing   Feeling that your heart is beating in a fast or not normal way (palpitations)   Pain in your chest   Chest pain or symptoms of a heart attack. Most heart attacks involve pain in the center of the chest that lasts more than a few minutes. The pain may go away and come back or it can be constant. It can feel like pressure, squeezing, fullness, or pain. Sometimes pain is felt in one or both arms, the back, neck, jaw, or stomach. If any of these symptoms last 2 minutes, call 911.   Tiredness that interferes with your daily activities   Feeling dizzy or lightheaded   Easy bleeding or bruising   Blood in your urine, vomit (bright red or coffee-ground) and/or stools (bright red, or black/tarry)   Coughing up blood   Decreased or very dark urine   Nausea that stops you from eating or drinking and/or is not relieved by prescribed medicines   Throwing up   Lasting loss of appetite or rapid weight loss of five pounds in a week   Diarrhea, 4 times in one day or diarrhea with lack of strength or a feeling of being dizzy   Pain that does not go away or is not relieved by prescribed medicines   Signs of possible liver problems: dark urine, pale bowel movements, bad stomach pain, feeling very tired and weak, unusual itching, or yellowing of the eyes or skin   Painful, red, or swollen areas on your hands or feet   Numbness and/or tingling of your hands and/or feet   A new rash or a rash that is not relieved by prescribed medicines   Flu-like symptoms: fever, headache, muscle and  joint aches, and fatigue (low energy, feeling weak)   If you think you may be pregnant or may have impregnated your partner   SELF CARE ACTIVITIES WHILE RECEIVING CHEMOTHERAPY:  Hydration Increase your fluid intake 48 hours prior to treatment and drink at least 8 to 12 cups (64 ounces) of water/decaffeinated beverages per day after  treatment. You can still have your cup of coffee or soda but these beverages do not count as part of your 8 to 12 cups that you need to drink daily. No alcohol intake.  Medications Continue taking your normal prescription medication as prescribed.  If you start any new herbal or new supplements please let us  know first to make sure it is safe.  Mouth Care Have teeth cleaned professionally before starting treatment. Keep dentures and partial plates clean. Use soft toothbrush and do not use mouthwashes that contain alcohol. Biotene is a good mouthwash that is available at most pharmacies or may be ordered by calling [phone]. Use warm salt water gargles (1 teaspoon salt per 1 quart warm water) before and after meals and at bedtime. If you need dental work, please let the doctor know before you go for your appointment so that we can coordinate the best possible time for you in regards to your chemo regimen. You need to also let your dentist know that you are actively taking chemo. We may need to do labs prior to your dental appointment.  Skin Care Always use sunscreen that has not expired and with SPF (Sun Protection Factor) of 50 or higher. Wear hats to protect your head from the sun. Remember to use sunscreen on your hands, ears, face, & feet.  Use good moisturizing lotions such as udder cream, eucerin, or even Vaseline. Some chemotherapies can cause dry skin, color changes in your skin and nails.    Avoid long, hot showers or baths. Use gentle, fragrance-free soaps and laundry detergent. Use moisturizers, preferably creams or ointments rather than lotions because the thicker consistency is better at preventing skin dehydration. Apply the cream or ointment within 15 minutes of showering. Reapply moisturizer at night, and moisturize your hands every time after you wash them.  Hair Loss (if your doctor says your hair will fall out)  If your doctor says that your hair is likely to fall out, decide  before you begin chemo whether you want to wear a wig. You may want to shop before treatment to match your hair color. Hats, turbans, and scarves can also camouflage hair loss, although some people prefer to leave their heads uncovered. If you go bare-headed outdoors, be sure to use sunscreen on your scalp. Cut your hair short. It eases the inconvenience of shedding lots of hair, but it also can reduce the emotional impact of watching your hair fall out. Don't perm or color your hair during chemotherapy. Those chemical treatments are already damaging to hair and can enhance hair loss. Once your chemo treatments are done and your hair has grown back, it's OK to resume dyeing or perming hair.  With chemotherapy, hair loss is almost always temporary. But when it grows back, it may be a different color or texture. In older adults who still had hair color before chemotherapy, the new growth may be completely gray.  Often, new hair is very fine and soft.  Infection Prevention Please wash your hands for at least 30 seconds using warm soapy water. Handwashing is the #1 way to prevent the spread of germs. Stay away from  sick people or people who are getting over a cold. If you develop respiratory systems such as green/yellow mucus production or productive cough or persistent cough let us  know and we will see if you need an antibiotic. It is a good idea to keep a pair of gloves on when going into grocery stores/[employer] to decrease your risk of coming into contact with germs on the carts, etc. Carry alcohol hand gel with you at all times and use it frequently if out in public. If your temperature reaches 100.4 or higher please call the clinic and let us  know.  If it is after hours or on the weekend please go to the ER if your temperature is over 100.5.  Please have your own personal thermometer at home to use.    Sex and bodily fluids If you are going to have sex, a condom must be used to protect the person that  isn't taking chemotherapy. Chemo can decrease your libido (sex drive). For a few days after chemotherapy, chemotherapy can be excreted through your bodily fluids.  When using the toilet please close the lid and flush the toilet twice.  Do this for a few day after you have had chemotherapy.   Effects of chemotherapy on your sex life Some changes are simple and won't last long. They won't affect your sex life permanently.  Sometimes you may feel: too tired not strong enough to be very active sick or sore  not in the mood anxious or low Your anxiety might not seem related to sex. For example, you may be worried about the cancer and how your treatment is going. Or you may be worried about money, or about how you family are coping with your illness.  These things can cause stress, which can affect your interest in sex. It's important to talk to your partner about how you feel.  Remember - the changes to your sex life don't usually last long. There's usually no medical reason to stop having sex during chemo. The drugs won't have any long term physical effects on your performance or enjoyment of sex. Cancer can't be passed on to your partner during sex  Contraception It's important to use reliable contraception during treatment. Avoid getting pregnant while you or your partner are having chemotherapy. This is because the drugs may harm the baby. Sometimes chemotherapy drugs can leave a man or woman infertile.  This means you would not be able to have children in the future. You might want to talk to someone about permanent infertility. It can be very difficult to learn that you may no longer be able to have children. Some people find counselling helpful. There might be ways to preserve your fertility, although this is easier for men than for women. You may want to speak to a fertility expert. You can talk about sperm banking or harvesting your eggs. You can also ask about other fertility options, such as  donor eggs. If you have or have had breast cancer, your doctor might advise you not to take the contraceptive pill. This is because the hormones in it might affect the cancer. It is not known for sure whether or not chemotherapy drugs can be passed on through semen or secretions from the vagina. Because of this some doctors advise people to use a barrier method if you have sex during treatment. This applies to vaginal, anal or oral sex. Generally, doctors advise a barrier method only for the time you are actually having the treatment  and for about a week after your treatment. Advice like this can be worrying, but this does not mean that you have to avoid being intimate with your partner. You can still have close contact with your partner and continue to enjoy sex.  Animals If you have cats or birds we just ask that you not change the litter or change the cage.  Please have someone else do this for you while you are on chemotherapy.   Food Safety During and After Cancer Treatment Food safety is important for people both during and after cancer treatment. Cancer and cancer treatments, such as chemotherapy, radiation therapy, and stem cell/bone marrow transplantation, often weaken the immune system. This makes it harder for your body to protect itself from foodborne illness, also called food poisoning. Foodborne illness is caused by eating food that contains harmful bacteria, parasites, or viruses.  Foods to avoid Some foods have a higher risk of becoming tainted with bacteria. These include: Unwashed fresh fruit and vegetables, especially leafy vegetables that can hide dirt and other contaminants Raw sprouts, such as alfalfa sprouts Raw or undercooked beef, especially ground beef, or other raw or undercooked meat and poultry Fatty, fried, or spicy foods immediately before or after treatment.  These can sit heavy on your stomach and make you feel nauseous. Raw or undercooked shellfish, such as  oysters. Sushi and sashimi, which often contain raw fish.  Unpasteurized beverages, such as unpasteurized fruit juices, raw milk, raw yogurt, or cider Undercooked eggs, such as soft boiled, over easy, and poached; raw, unpasteurized eggs; or foods made with raw egg, such as homemade raw cookie dough and homemade mayonnaise  Simple steps for food safety  Shop smart. Do not buy food stored or displayed in an unclean area. Do not buy bruised or damaged fruits or vegetables. Do not buy cans that have cracks, dents, or bulges. Pick up foods that can spoil at the end of your shopping trip and store them in a cooler on the way home.  Prepare and clean up foods carefully. Rinse all fresh fruits and vegetables under running water, and dry them with a clean towel or paper towel. Clean the top of cans before opening them. After preparing food, wash your hands for 20 seconds with hot water and soap. Pay special attention to areas between fingers and under nails. Clean your utensils and dishes with hot water and soap. Disinfect your kitchen and cutting boards using 1 teaspoon of liquid, unscented bleach mixed into 1 quart of water.    Dispose of old food. Eat canned and packaged food before its expiration date (the "use by" or "best before" date). Consume refrigerated leftovers within 3 to 4 days. After that time, throw out the food. Even if the food does not smell or look spoiled, it still may be unsafe. Some bacteria, such as Listeria, can grow even on foods stored in the refrigerator if they are kept for too long.  Take precautions when eating out. At restaurants, avoid buffets and salad bars where food sits out for a long time and comes in contact with many people. Food can become contaminated when someone with a virus, often a norovirus, or another "bug" handles it. Put any leftover food in a "to-go" container yourself, rather than having the server do it. And, refrigerate leftovers as soon as you  get home. Choose restaurants that are clean and that are willing to prepare your food as you order it cooked.   Wear comfortable clothing  and clothing appropriate for easy access to any Portacath or PICC line. Let us  know if there is anything that we can do to make your therapy better!    What to do if you need assistance after hours or on the weekends: CALL [phone].  HOLD on the line, do not hang up.  You will hear multiple messages but at the end you will be connected with a nurse triage line.  They will contact the doctor if necessary.  Most of the time they will be able to assist you.  Do not call the hospital operator.      I have been informed and understand all of the instructions given to me and have received a copy. I have been instructed to call the clinic [phone] or my family physician as soon as possible for continued medical care, if indicated. I do not have any more questions at this time but understand that I may call the Cancer Center or the Patient Navigator at [phone] during office hours should I have questions or need assistance in obtaining follow-up care.

## 2024-02-26 NOTE — Telephone Encounter (Signed)
 Clinical Pharmacist Practitioner Encounter   [facility] (Specialty) will deliver medication to patient on 02/28/24. They know not to start until 03/06/24.   Patient Education I spoke with patient's wife Joy for overview of new oral chemotherapy medication: Xeloda  (capecitabine ) for the treatment of stage IV colon cancer in conjunction with bevacizumab, planned duration until disease progression or unacceptable drug toxicity. Planned start 03/06/24.   Counseled Joy on administration, dosing, side effects, monitoring, drug-food interactions, safe handling, storage, and disposal. Patient will take 2 tablets (1,000 mg total) by mouth 2 (two) times daily after a meal. Take for 2 weeks on and 1 week off .  Side effects include but not limited to: diarrhea, hand-foot syndrome, mouth sores, edema, decreased wbc, fatigue, N/V Diarrhea: Patient knows to use loperamide as needed and call the office if they are having 4 or more loose stools per day. Hand-foot syndrome: Recommended the use of Udderly Smooth Extra Care 20. Also recommended patient use diclofenac gel, 1 fingertip amount on palms of hands and soles of feet twice daily. Patient knows to report any skin changes they notice.  Mouth sores: Patient knows to request magic mouthwash if needed.    Reviewed with Joy importance of keeping a medication schedule and plan for any missed doses.  After discussion with Joy no patient barriers to medication adherence identified.   Distress evaluation: Distress thermometer completed during telephone call and reviewed with patient. Due to score, social work referral appropriate. Patient already has an appt with social work for tomorrow 02/27/24. No new referral needed at this time  Mrs. Mcmichen voiced understanding and appreciation. All questions answered. Medication handout provided.  Provided Joy with Oral Chemotherapy Navigation Clinic phone number. Joy knows to call the office with questions or concerns.  Oral Chemotherapy Navigation Clinic will continue to follow.  Unnamed Hino N. Murice Barbar, PharmD, BCOP, CPP Hematology/Oncology Clinical Pharmacist [facility] Oral Chemotherapy Navigation Clinic [phone]  02/26/2024 3:42 PM

## 2024-02-26 NOTE — Progress Notes (Signed)
 Specialty Pharmacy Initial Fill Coordination Note  Herbert Wood is a 89 y.o. male contacted today regarding refills of specialty medication(s) Capecitabine  (XELODA ) .  Patient requested Delivery  on 02/28/24  to verified address [address]   [place] [address]   Medication will be filled on 02/27/24.   Patient is aware of $5.39 copayment.   Lucie Lamer, CPhT [place]  [facility] Pharmacy Technician Patient Advocate Specialist II THERESSA [place] Phone: [phone]  Fax: [phone] [email or web address]

## 2024-02-26 NOTE — Progress Notes (Signed)
 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

## 2024-02-26 NOTE — Progress Notes (Signed)
 Patient education documented in EPIC note on 02/26/24.

## 2024-02-26 NOTE — Patient Instructions (Signed)
 [facility] at [facility] Discharge Instructions   You were seen and examined today by Dr. Rogers.  He reviewed the results of your molecular testing that did not show any mutations to target.   Dr. MARLA discussed treatment with you. The treatment would be a combination of a chemotherapy pill called Xeloda  and a monoclonal antibody called Avastin. The Avastin is given in the clinic every 3 weeks. The pills are taken twice a day. They are given for 2 weeks on and then you take a week off.   If you decide you do not want to pursue chemotherapy, we could see about getting radiation treatment to the liver spot via a procedure called Y-90 with Dr. Luverne.   We will see you back after your visit with your cardiologist.   Return as scheduled.    Thank you for choosing [facility] at [facility] [place] to provide your oncology and hematology care.  To afford each patient quality time with our provider, please arrive at least 15 minutes before your scheduled appointment time.   If you have a lab appointment with the Cancer Center please come in thru the Main Entrance and check in at the main information desk.  You need to re-schedule your appointment should you arrive 10 or more minutes late.  We strive to give you quality time with our providers, and arriving late affects you and other patients whose appointments are after yours.  Also, if you no show three or more times for appointments you may be dismissed from the clinic at the providers discretion.     Again, thank you for choosing [facility].  Our hope is that these requests will decrease the amount of time that you wait before being seen by our physicians.       _____________________________________________________________  Should you have questions after your visit to [facility], please contact our office at [phone] and follow the prompts.  Our office hours are 8:00  a.m. and 4:30 p.m. Monday - Friday.  Please note that voicemails left after 4:00 p.m. may not be returned until the following business day.  We are closed weekends and major holidays.  You do have access to a nurse 24-7, just call the main number to the clinic [phone] and do not press any options, hold on the line and a nurse will answer the phone.    For prescription refill requests, have your pharmacy contact our office and allow 72 hours.    Due to Covid, you will need to wear a mask upon entering the hospital. If you do not have a mask, a mask will be given to you at the Main Entrance upon arrival. For doctor visits, patients may have 1 support person age 61 or older with them. For treatment visits, patients can not have anyone with them due to social distancing guidelines and our immunocompromised population.

## 2024-02-26 NOTE — Telephone Encounter (Signed)
 Oral Oncology Patient Advocate Encounter  After completing a benefits investigation, prior authorization for Capecitabine  is not required at this time through patient's medicare part B.  Patient's copay is $5.39.     Shamond Skelton (Patty) Chet Burnet, CPhT  [facility], Zelda Salmon, [facility] Oral Chemotherapy Patient Advocate Specialist III Phone: [phone]  Fax: [phone]

## 2024-02-27 ENCOUNTER — Other Ambulatory Visit: Payer: Self-pay

## 2024-02-27 ENCOUNTER — Inpatient Hospital Stay: Admitting: Licensed Clinical Social Worker

## 2024-02-27 DIAGNOSIS — C187 Malignant neoplasm of sigmoid colon: Secondary | ICD-10-CM

## 2024-02-27 NOTE — Progress Notes (Signed)
 [facility] Clinical Social Work  Initial Assessment   Herbert Wood is a 89 y.o. year old male contacted by phone, both pt and spouse present on the phone. Clinical Social Work was referred by medical provider for assessment of psychosocial needs.   SDOH (Social Determinants of Health) assessments performed: Yes   SDOH Screenings   Food Insecurity: No Food Insecurity (10/10/2023)  Housing: Low Risk  (10/10/2023)  Transportation Needs: No Transportation Needs (10/10/2023)  Utilities: Not At Risk (10/10/2023)  Depression (PHQ2-9): Low Risk  (02/26/2024)  Social Connections: Moderately Integrated (10/10/2023)  Tobacco Use: Medium Risk (10/10/2023)     Distress Screen completed: Yes    02/26/2024    4:00 PM  ONCBCN DISTRESS SCREENING  Screening Type Initial Screening  How much distress have you been experiencing in the past week? (0-10) 5      Family/Social Information:  Housing Arrangement: patient lives with his wife.  Pt is independent in ADLs.  Pt's spouse works from home for a nephrology office.  Family members/support persons in your life? Spouse reports she will be the primary caregiver, but if additional assistance is needed their daughter will be able to provide help.  Transportation concerns: no  Employment: Retired Theatre stage manager.  Income source: Actor concerns: No Type of concern: None Food access concerns: no Religious or spiritual practice: Not known Advanced directives: Not known Services Currently in place:  none  Coping/ Adjustment to diagnosis: Patient understands treatment plan and what happens next? yes Concerns about diagnosis and/or treatment: Quality of life Patient reported stressors: Adjusting to my illness Hopes and/or priorities: pt's priority is to start treatment w/ the hope of positive results Patient enjoys not addressed Current coping skills/ strengths: Capable of independent living , Motivation for  treatment/growth , Physical Health , and Supportive family/friends     SUMMARY: Current SDOH Barriers:  No barriers identified at this time  Clinical Social Work Clinical Goal(s):  No clinical social work goals at this time  Interventions: Discussed common feeling and emotions when being diagnosed with cancer, and the importance of support during treatment Informed patient of the support team roles and support services at [facility] Provided CSW contact information and encouraged patient to call with any questions or concerns Informed pt of transportation reimbursement services through the [employer].  Pt declining resources at this time.    Follow Up Plan: Patient will contact CSW with any support or resource needs Patient verbalizes understanding of plan: Yes    Devere Herbert Manna, LCSW Clinical Social Worker [facility]

## 2024-02-28 ENCOUNTER — Other Ambulatory Visit: Payer: Self-pay | Admitting: Hematology

## 2024-02-28 ENCOUNTER — Inpatient Hospital Stay

## 2024-02-28 DIAGNOSIS — C189 Malignant neoplasm of colon, unspecified: Secondary | ICD-10-CM

## 2024-02-28 DIAGNOSIS — C187 Malignant neoplasm of sigmoid colon: Secondary | ICD-10-CM

## 2024-02-28 NOTE — Progress Notes (Signed)

## 2024-03-03 NOTE — Progress Notes (Signed)
 Pharmacist Chemotherapy Monitoring - Initial Assessment    Anticipated start date: 03/06/24   The following has been reviewed per standard work regarding the patient's treatment regimen: The patient's diagnosis, treatment plan and drug doses, and organ/hematologic function Lab orders and baseline tests specific to treatment regimen  The treatment plan start date, drug sequencing, and pre-medications Prior authorization status  Patient's documented medication list, including drug-drug interaction screen and prescriptions for anti-emetics and supportive care specific to the treatment regimen The drug concentrations, fluid compatibility, administration routes, and timing of the medications to be used The patient's access for treatment and lifetime cumulative dose history, if applicable  The patient's medication allergies and previous infusion related reactions, if applicable   Changes made to treatment plan:  N/A  Follow up needed:  N/A   Herbert Wood, [facility], 03/03/2024  3:28 PM

## 2024-03-05 ENCOUNTER — Other Ambulatory Visit: Payer: Self-pay

## 2024-03-05 DIAGNOSIS — C187 Malignant neoplasm of sigmoid colon: Secondary | ICD-10-CM

## 2024-03-06 ENCOUNTER — Other Ambulatory Visit: Payer: Self-pay | Admitting: Oncology

## 2024-03-06 ENCOUNTER — Inpatient Hospital Stay

## 2024-03-06 ENCOUNTER — Inpatient Hospital Stay: Attending: Hematology

## 2024-03-06 VITALS — BP 145/56 | HR 48 | Temp 97.9°F | Resp 18

## 2024-03-06 DIAGNOSIS — D696 Thrombocytopenia, unspecified: Secondary | ICD-10-CM | POA: Insufficient documentation

## 2024-03-06 DIAGNOSIS — D6851 Activated protein C resistance: Secondary | ICD-10-CM | POA: Insufficient documentation

## 2024-03-06 DIAGNOSIS — C787 Secondary malignant neoplasm of liver and intrahepatic bile duct: Secondary | ICD-10-CM | POA: Diagnosis not present

## 2024-03-06 DIAGNOSIS — D509 Iron deficiency anemia, unspecified: Secondary | ICD-10-CM | POA: Diagnosis not present

## 2024-03-06 DIAGNOSIS — C187 Malignant neoplasm of sigmoid colon: Secondary | ICD-10-CM | POA: Diagnosis present

## 2024-03-06 DIAGNOSIS — C189 Malignant neoplasm of colon, unspecified: Secondary | ICD-10-CM

## 2024-03-06 DIAGNOSIS — R7989 Other specified abnormal findings of blood chemistry: Secondary | ICD-10-CM | POA: Insufficient documentation

## 2024-03-06 DIAGNOSIS — C7800 Secondary malignant neoplasm of unspecified lung: Secondary | ICD-10-CM | POA: Insufficient documentation

## 2024-03-06 DIAGNOSIS — Z7901 Long term (current) use of anticoagulants: Secondary | ICD-10-CM | POA: Diagnosis not present

## 2024-03-06 DIAGNOSIS — Z86718 Personal history of other venous thrombosis and embolism: Secondary | ICD-10-CM | POA: Insufficient documentation

## 2024-03-06 DIAGNOSIS — N1831 Chronic kidney disease, stage 3a: Secondary | ICD-10-CM | POA: Diagnosis not present

## 2024-03-06 DIAGNOSIS — D631 Anemia in chronic kidney disease: Secondary | ICD-10-CM | POA: Diagnosis not present

## 2024-03-06 DIAGNOSIS — Z5112 Encounter for antineoplastic immunotherapy: Secondary | ICD-10-CM | POA: Diagnosis present

## 2024-03-06 LAB — COMPREHENSIVE METABOLIC PANEL WITH GFR
ALT: 34 U/L (ref 0–44)
AST: 29 U/L (ref 15–41)
Albumin: 3.5 g/dL (ref 3.5–5.0)
Alkaline Phosphatase: 143 U/L — ABNORMAL HIGH (ref 38–126)
Anion gap: 9 (ref 5–15)
BUN: 33 mg/dL — ABNORMAL HIGH (ref 8–23)
CO2: 23 mmol/L (ref 22–32)
Calcium: 8.9 mg/dL (ref 8.9–10.3)
Chloride: 109 mmol/L (ref 98–111)
Creatinine, Ser: 1.42 mg/dL — ABNORMAL HIGH (ref 0.61–1.24)
GFR, Estimated: 48 mL/min — ABNORMAL LOW (ref >60)
Glucose, Bld: 139 mg/dL — ABNORMAL HIGH (ref 70–99)
Potassium: 3.8 mmol/L (ref 3.5–5.1)
Sodium: 141 mmol/L (ref 135–145)
Total Bilirubin: 1.7 mg/dL — ABNORMAL HIGH (ref 0.0–1.2)
Total Protein: 6.6 g/dL (ref 6.5–8.1)

## 2024-03-06 LAB — CBC WITH DIFFERENTIAL/PLATELET
Abs Immature Granulocytes: 0.01 K/uL (ref 0.00–0.07)
Basophils Absolute: 0 K/uL (ref 0.0–0.1)
Basophils Relative: 1 %
Eosinophils Absolute: 0.2 K/uL (ref 0.0–0.5)
Eosinophils Relative: 3 %
HCT: 35 % — ABNORMAL LOW (ref 39.0–52.0)
Hemoglobin: 11.7 g/dL — ABNORMAL LOW (ref 13.0–17.0)
Immature Granulocytes: 0 %
Lymphocytes Relative: 11 %
Lymphs Abs: 0.7 K/uL (ref 0.7–4.0)
MCH: 33.6 pg (ref 26.0–34.0)
MCHC: 33.4 g/dL (ref 30.0–36.0)
MCV: 100.6 fL — ABNORMAL HIGH (ref 80.0–100.0)
Monocytes Absolute: 0.6 K/uL (ref 0.1–1.0)
Monocytes Relative: 9 %
Neutro Abs: 5 K/uL (ref 1.7–7.7)
Neutrophils Relative %: 76 %
Platelets: 146 K/uL — ABNORMAL LOW (ref 150–400)
RBC: 3.48 MIL/uL — ABNORMAL LOW (ref 4.22–5.81)
RDW: 13.4 % (ref 11.5–15.5)
WBC: 6.6 K/uL (ref 4.0–10.5)
nRBC: 0 % (ref 0.0–0.2)

## 2024-03-06 LAB — URINALYSIS, DIPSTICK ONLY
Bilirubin Urine: NEGATIVE
Glucose, UA: NEGATIVE mg/dL
Hgb urine dipstick: NEGATIVE
Ketones, ur: NEGATIVE mg/dL
Nitrite: NEGATIVE
Protein, ur: 100 mg/dL — AB
Specific Gravity, Urine: 1.017 (ref 1.005–1.030)
pH: 5 (ref 5.0–8.0)

## 2024-03-06 MED ORDER — SODIUM CHLORIDE 0.9 % IV SOLN
7.5000 mg/kg | Freq: Once | INTRAVENOUS | Status: AC
  Administered 2024-03-06: 700 mg via INTRAVENOUS
  Filled 2024-03-06: qty 16

## 2024-03-06 MED ORDER — SODIUM CHLORIDE 0.9 % IV SOLN: INTRAVENOUS | Status: DC

## 2024-03-06 NOTE — Progress Notes (Signed)
 SABRA

## 2024-03-06 NOTE — Progress Notes (Signed)
 Patient presents today for C1D1 Avastin . Urine protein 100. Creatinine 1.42. Blood pressure 140/53. Message sent to Dr. Rogers / Dr. Davonna and urine protein reported. Vital signs within parameters for treatment.   Message received from Dr. Kandala to proceed with treatment and have patient collect a 24 hour urine for protein. Orders placed. Collection instructions,and 24 hour urine container given to patient and understanding verbalized.   Avastin  given today per MD orders. Tolerated infusion without adverse affects. Vital signs stable. No complaints at this time. Discharged from clinic ambulatory in stable condition. Alert and oriented x 3. F/U with [facility] as scheduled.

## 2024-03-06 NOTE — Patient Instructions (Signed)
 [facility] [place] - A DEPT OF [facility]  Discharge Instructions: Thank you for choosing [facility] to provide your oncology and hematology care.  If you have a lab appointment with the Cancer Center - please note that after April 8th, 2024, all labs will be drawn in the cancer center.  You do not have to check in or register with the main entrance as you have in the past but will complete your check-in in the cancer center.  Wear comfortable clothing and clothing appropriate for easy access to any Portacath or PICC line.   We strive to give you quality time with your provider. You may need to reschedule your appointment if you arrive late (15 or more minutes).  Arriving late affects you and other patients whose appointments are after yours.  Also, if you miss three or more appointments without notifying the office, you may be dismissed from the clinic at the provider's discretion.      For prescription refill requests, have your pharmacy contact our office and allow 72 hours for refills to be completed.    Today you received the following chemotherapy and/or immunotherapy agents Avastin . Bevacizumab  Injection What is this medication? BEVACIZUMAB  (be va SIZ yoo mab) treats some types of cancer. It works by blocking a protein that causes cancer cells to grow and multiply. This helps to slow or stop the spread of cancer cells. It is a monoclonal antibody. This medicine may be used for other purposes; ask your health care provider or pharmacist if you have questions. COMMON BRAND NAME(S): Alymsys , Avastin , MVASI , Vegzalma, Zirabev  What should I tell my care team before I take this medication? They need to know if you have any of these conditions: Blood clots Coughing up blood Having or recent surgery Heart failure High blood pressure History of a connection between 2 or more body parts that do not usually connect (fistula) History of a tear in your  stomach or intestines Protein in your urine An unusual or allergic reaction to bevacizumab , other medications, foods, dyes, or preservatives Pregnant or trying to get pregnant Breast-feeding How should I use this medication? This medication is injected into a vein. It is given by your care team in a hospital or clinic setting. Talk to your care team the use of this medication in children. Special care may be needed. Overdosage: If you think you have taken too much of this medicine contact a poison control center or emergency room at once. NOTE: This medicine is only for you. Do not share this medicine with others. What if I miss a dose? Keep appointments for follow-up doses. It is important not to miss your dose. Call your care team if you are unable to keep an appointment. What may interact with this medication? Interactions are not expected. This list may not describe all possible interactions. Give your health care provider a list of all the medicines, herbs, non-prescription drugs, or dietary supplements you use. Also tell them if you smoke, drink alcohol, or use illegal drugs. Some items may interact with your medicine. What should I watch for while using this medication? Your condition will be monitored carefully while you are receiving this medication. You may need blood work while taking this medication. This medication may make you feel generally unwell. This is not uncommon as chemotherapy can affect healthy cells as well as cancer cells. Report any side effects. Continue your course of treatment even though you feel ill  unless your care team tells you to stop. This medication may increase your risk to bruise or bleed. Call your care team if you notice any unusual bleeding. Before having surgery, talk to your care team to make sure it is ok. This medication can increase the risk of poor healing of your surgical site or wound. You will need to stop this medication for 28 days before  surgery. After surgery, wait at least 28 days before restarting this medication. Make sure the surgical site or wound is healed enough before restarting this medication. Talk to your care team if questions. Talk to your care team if you may be pregnant. Serious birth defects can occur if you take this medication during pregnancy and for 6 months after the last dose. Contraception is recommended while taking this medication and for 6 months after the last dose. Your care team can help you find the option that works for you. Do not breastfeed while taking this medication and for 6 months after the last dose. This medication can cause infertility. Talk to your care team if you are concerned about your fertility. What side effects may I notice from receiving this medication? Side effects that you should report to your care team as soon as possible: Allergic reactions--skin rash, itching, hives, swelling of the face, lips, tongue, or throat Bleeding--bloody or black, tar-like stools, vomiting blood or brown material that looks like coffee grounds, red or dark brown urine, small red or purple spots on skin, unusual bruising or bleeding Blood clot--pain, swelling, or warmth in the leg, shortness of breath, chest pain Heart attack--pain or tightness in the chest, shoulders, arms, or jaw, nausea, shortness of breath, cold or clammy skin, feeling faint or lightheaded Heart failure--shortness of breath, swelling of the ankles, feet, or hands, sudden weight gain, unusual weakness or fatigue Increase in blood pressure Infection--fever, chills, cough, sore throat, wounds that don't heal, pain or trouble when passing urine, general feeling of discomfort or being unwell Infusion reactions--chest pain, shortness of breath or trouble breathing, feeling faint or lightheaded Kidney injury--decrease in the amount of urine, swelling of the ankles, hands, or feet Stomach pain that is severe, does not go away, or gets  worse Stroke--sudden numbness or weakness of the face, arm, or leg, trouble speaking, confusion, trouble walking, loss of balance or coordination, dizziness, severe headache, change in vision Sudden and severe headache, confusion, change in vision, seizures, which may be signs of posterior reversible encephalopathy syndrome (PRES) Side effects that usually do not require medical attention (report to your care team if they continue or are bothersome): Back pain Change in taste Diarrhea Dry skin Increased tears Nosebleed This list may not describe all possible side effects. Call your doctor for medical advice about side effects. You may report side effects to FDA at 1-800-FDA-1088. Where should I keep my medication? This medication is given in a hospital or clinic. It will not be stored at home. NOTE: This sheet is a summary. It may not cover all possible information. If you have questions about this medicine, talk to your doctor, pharmacist, or health care provider.  2024 Elsevier/Gold Standard (2021-12-02 00:00:00)      To help prevent nausea and vomiting after your treatment, we encourage you to take your nausea medication as directed.  BELOW ARE SYMPTOMS THAT SHOULD BE REPORTED IMMEDIATELY: *FEVER GREATER THAN 100.4 F (38 C) OR HIGHER *CHILLS OR SWEATING *NAUSEA AND VOMITING THAT IS NOT CONTROLLED WITH YOUR NAUSEA MEDICATION *UNUSUAL SHORTNESS OF BREATH *  UNUSUAL BRUISING OR BLEEDING *URINARY PROBLEMS (pain or burning when urinating, or frequent urination) *BOWEL PROBLEMS (unusual diarrhea, constipation, pain near the anus) TENDERNESS IN MOUTH AND THROAT WITH OR WITHOUT PRESENCE OF ULCERS (sore throat, sores in mouth, or a toothache) UNUSUAL RASH, SWELLING OR PAIN  UNUSUAL VAGINAL DISCHARGE OR ITCHING   Items with * indicate a potential emergency and should be followed up as soon as possible or go to the Emergency Department if any problems should occur.  Please show the  CHEMOTHERAPY ALERT CARD or IMMUNOTHERAPY ALERT CARD at check-in to the Emergency Department and triage nurse.  Should you have questions after your visit or need to cancel or reschedule your appointment, please contact [facility] [place] - A DEPT OF [facility] [phone]  and follow the prompts.  Office hours are 8:00 a.m. to 4:30 p.m. Monday - Friday. Please note that voicemails left after 4:00 p.m. may not be returned until the following business day.  We are closed weekends and major holidays. You have access to a nurse at all times for urgent questions. Please call the main number to the clinic [phone] and follow the prompts.  For any non-urgent questions, you may also contact your provider using MyChart. We now offer e-Visits for anyone 89 and older to request care online for non-urgent symptoms. For details visit [email or web address].   Also download the MyChart app! Go to the app store, search MyChart, open the app, select [place], and log in with your MyChart username and password.

## 2024-03-07 ENCOUNTER — Telehealth: Payer: Self-pay

## 2024-03-07 NOTE — Telephone Encounter (Signed)
Patient called for 24-hour follow up, no response from patient.

## 2024-03-11 ENCOUNTER — Encounter (INDEPENDENT_AMBULATORY_CARE_PROVIDER_SITE_OTHER): Payer: Self-pay

## 2024-03-11 ENCOUNTER — Other Ambulatory Visit: Payer: Self-pay

## 2024-03-11 DIAGNOSIS — Z5112 Encounter for antineoplastic immunotherapy: Secondary | ICD-10-CM | POA: Diagnosis not present

## 2024-03-11 DIAGNOSIS — C189 Malignant neoplasm of colon, unspecified: Secondary | ICD-10-CM

## 2024-03-11 LAB — PROTEIN, URINE, 24 HOUR
Collection Interval-UPROT: 24 h
Protein, 24H Urine: 1830 mg/d — ABNORMAL HIGH (ref 50–100)
Protein, Urine: 122 mg/dL
Urine Total Volume-UPROT: 1500 mL

## 2024-03-12 ENCOUNTER — Other Ambulatory Visit (HOSPITAL_COMMUNITY): Payer: Self-pay

## 2024-03-12 ENCOUNTER — Other Ambulatory Visit: Payer: Self-pay

## 2024-03-12 NOTE — Progress Notes (Signed)
 Specialty Pharmacy Refill Coordination Note  MyChart Questionnaire Submission  Herbert Wood is a 89 y.o. male contacted today regarding refills of specialty medication(s) Xeloda .  Doses on hand: (Patient-Rptd) 16 (2 tablets b.i.d. x 8 days)  (through 03-19-24)   Next cycle: 03/20/24  Patient requested: (Patient-Rptd) Delivery   Delivery date: 03/14/24  Verified address: [address] [place] [address]  Medication will be filled on 03/13/24.

## 2024-03-13 ENCOUNTER — Other Ambulatory Visit: Payer: Self-pay

## 2024-03-17 ENCOUNTER — Inpatient Hospital Stay: Admitting: Dietician

## 2024-03-17 DIAGNOSIS — M549 Dorsalgia, unspecified: Secondary | ICD-10-CM | POA: Insufficient documentation

## 2024-03-17 DIAGNOSIS — D631 Anemia in chronic kidney disease: Secondary | ICD-10-CM | POA: Insufficient documentation

## 2024-03-17 NOTE — Assessment & Plan Note (Addendum)
-   Continue Eliquis  daily.  No bleeding issues reported.

## 2024-03-17 NOTE — Progress Notes (Signed)
 [facility] OFFICE PROGRESS NOTE  Meade Bigness, MD  ASSESSMENT & PLAN:  Assessment & Plan Cancer of sigmoid colon [facility]) - PET scan from 01/24/2024: Hypermetabolic right hepatic lobe mass with a small satellite lesion.  Scattered lung nodules which are hypermetabolic.  No other evidence of metastatic disease. - NGS test and Guardant360 did not reveal any targetable mutations. - Patient is status post 1 cycle of Xeloda  (twice daily 2 weeks on 1 week off) and bevacizumab  every 3 weeks.  Appears to be tolerating well.  Denies any unwanted side effects including GI, mucositis or hand foot syndrome. -We also reviewed his labs from 03/18/2024 which showed mild anemia, thrombocytopenia with a normal differential.  We discussed these are expected findings while on treatment.  Will continue to monitor closely prior to each treatment to make sure they do not get too low. -Metabolic panel showed mild hypomagnesemia with elevated creatinine 1.47.  Kidney function appears to be stable.  We discussed starting magnesium  supplements to see if that will improve his magnesium  level.  If not, we can give him magnesium  through an IV.  We discussed side effects to magnesium  including diarrhea and GI upset.  He will let us  know if that occurs. - If he does not tolerate, we would stop treatments and refer him to SBRT for the liver lesion to slow down progression.  He wants radiation to be done close to home in [place]. -Dr. Rogers would also like for him to have genetic screening and counseling.-He will have his labs drawn today. 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   -Ferritin is 154 iron saturation 31.   -B12 and folic acid  were normal. -He will likely become more anemic secondary to treatment but for now he does not need any supplements.  History of DVT (deep vein thrombosis) - Continue Eliquis  daily.  No bleeding issues  reported.  Chronic low back pain, unspecified back pain laterality, unspecified whether sciatica present - Continue tramadol  as needed.     Orders Placed This Encounter  Procedures   CBC with Differential    Standing Status:   Future    Number of Occurrences:   1    Expected Date:   03/18/2024    Expiration Date:   06/16/2024   Comprehensive metabolic panel    Standing Status:   Future    Number of Occurrences:   1    Expected Date:   03/18/2024    Expiration Date:   06/16/2024   Magnesium     Standing Status:   Future    Number of Occurrences:   1    Expected Date:   03/18/2024    Expiration Date:   06/16/2024    INTERVAL HISTORY:  SUMMARY OF HEMATOLOGIC HISTORY: - Anemia with hemoglobin of 8, recent admission with diverticulitis.  He also had weight loss of 40 pounds in the last 6 months. - Colonoscopy by Dr. Eartha (01/10/2022): Fungating and ulcerated partially obstructing mass found at 42 cm proximal to the anus.  Mass is circumferential and measured 3 cm in length. - Pathology: Invasive moderately differentiated adenocarcinoma of the descending colon mass.  Transverse colon polypectomy shows fragments of at least intramucosal adenocarcinoma and high-grade dysplasia. - CT CAP (01/13/2022): No evidence of metastatic disease in the chest, abdomen or pelvis.  Several tiny lung nodules and scattered areas of interstitial irregularity in the right upper lobe likely postinflammatory. - Preoperative CEA (  01/10/2022): 7.9. - Left hemicolectomy (02/17/2022): Moderately differentiated grade 2 adenocarcinoma, no perforation, no lymphovascular/perineural invasion, margins negative.  0/21 lymph nodes involved.  PT3 pN0. - PET scan on 11/09/2022: 3.2 x 2.3 cm right hepatic lobe metastasis. - MRI of the liver on 11/22/2022: Isolated inferior right hepatic lobe 2.8 cm. - Right lobe lesion thermal ablation on 12/27/2022. - Percutaneous microwave thermal ablation of the right lobe liver mass on  10/10/2023 - Liver lesion biopsy (02/04/2024): Metastatic colon cancer. - NGS: KRAS G 13D, MS-stable, TMB-low, HER2 (0), ATM, SMAD4, APC - Guardant360: KRAS G 13D, K-ras K117N, ATM [id number].  UG T1A1 *28/*28 for metabolizer.  - Patient had a left leg DVT approximately 2 years ago.  His wife thinks that DVT has happened about 4 to 6 weeks after his second COVID infection.  He was very minimally symptomatic from the COVID infection.  At the time of the DVT, he was completely mobile and did not have any surgery. - Patient was heterozygous for factor V Leiden as checked by Dr. Devonda on 09/20/2011. - I have recommended that he get the records of timing of DVT and the COVID test.  If it is within 4 to 6 weeks of his COVID diagnosis, we may safely discontinue anticoagulation considering it can be provoked by COVID infection.  If it is unprovoked DVT, may continue low intensity anticoagulation.   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 - Adjuvant XRT 50 Gray, completed 08/11/2015    No results found for: CBC  There were no vitals filed for this visit.  Review of Systems  Respiratory:  Positive for shortness of breath.   Gastrointestinal:  Positive for nausea.  Neurological:  Positive for dizziness, tingling and sensory change.    Physical Exam Constitutional:      Appearance: Normal appearance.  Cardiovascular:     Rate and Rhythm: Normal rate and regular rhythm.  Pulmonary:     Effort: Pulmonary effort is normal.     Breath sounds: Normal breath sounds.  Abdominal:     General: Bowel sounds are normal.     Palpations: Abdomen is soft.  Musculoskeletal:        General: No swelling. Normal range of motion.  Neurological:     Mental Status: He is alert and oriented to person, place, and time. Mental status is at baseline.      I spent 30 minutes dedicated to the care of this patient (face-to-face and non-face-to-face) on  the date of the encounter to include what is described in the assessment and plan.,  Delon Hope, NP 03/18/2024 9:26 AM

## 2024-03-17 NOTE — Progress Notes (Signed)
 Nutrition Assessment   Reason for Assessment: Referral   ASSESSMENT: 89 year old with recurrent colon cancer metastatic to liver. S/p left hemicolectomy (02/17/22), right liver lobe thermal ablation (12/27/22), microwave ablation (10/10/23). Patient is currently receiving chemoimmunotherapy with xeloda  + beva q21d. Patient is under the care of Dr. Davonna  Past medical history includes HTN, GERD, DM2, HOH, CKD3, factor V leiden, HLD, DVT, IDA  Spoke with patient and wife via telephone. Patient is very hard of hearing and having difficulties with hearing aid this morning. Wife provides most of nutrition history. Patient is eating 3 meals as usual. Patient and wife eat most meals out. They go to the same place for breakfast every morning. Usually has 2 eggs with cheese, bacon, toast, and decaf coffee. Patient ate chicken tenders and potato salad for lunch. Last night had 2 hotdogs, baked beans, and bowl of ice cream at church. Recalls oatmeal cookie after getting home. He does not drink much water. Prefers gatorade zero with medications, diet pepsi or decaf coffee when dining out. Patient does take chemo with full glass of water. He reports 2 episodes of nausea without vomiting. Compazine  worked well. Denies constipation or diarrhea.   Nutrition Focused Physical Exam: deferred   Medications: amlodipine , lipitor, coreg , D3, eliquis , ferrous sulfate , folic acid , tradjenta , prilosec, zofran , compazine , tramadol , B12   Labs: 8/7 - glucose 139, BUN 33, Cr 1.42   Anthropometrics:   Height: 5'10 Weight: 194 lb 3.6 oz (7/29) UBW: 220 lb (prior to colectomy 2023 per pt) BMI: 27.87   NUTRITION DIAGNOSIS: Food and nutrition related knowledge deficit related to cancer as evidenced by no prior need for associated nutrition information     INTERVENTION:  Continue eating 3 good meals and including good sources of lean proteins Patient will work to increase daily water - suggested low-fat milk as an  option vs diet sodas Discussed strategies for nausea. Continue antiemetics as needed    MONITORING, EVALUATION, GOAL: Patient will tolerate adequate calories and protein to minimize wt loss    Next Visit: Monday September 15 via telephone

## 2024-03-17 NOTE — Assessment & Plan Note (Addendum)
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   -Ferritin is 154 iron saturation 31.   -B12 and folic acid  were normal. -He will likely become more anemic secondary to treatment but for now he does not need any supplements.

## 2024-03-17 NOTE — Assessment & Plan Note (Addendum)
 Continue tramadol as needed

## 2024-03-17 NOTE — Assessment & Plan Note (Addendum)
-   PET scan from 01/24/2024: Hypermetabolic right hepatic lobe mass with a small satellite lesion.  Scattered lung nodules which are hypermetabolic.  No other evidence of metastatic disease. - NGS test and Guardant360 did not reveal any targetable mutations. - Patient is status post 1 cycle of Xeloda  (twice daily 2 weeks on 1 week off) and bevacizumab  every 3 weeks.  Appears to be tolerating well.  Denies any unwanted side effects including GI, mucositis or hand foot syndrome. -We also reviewed his labs from 03/18/2024 which showed mild anemia, thrombocytopenia with a normal differential.  We discussed these are expected findings while on treatment.  Will continue to monitor closely prior to each treatment to make sure they do not get too low. -Metabolic panel showed mild hypomagnesemia with elevated creatinine 1.47.  Kidney function appears to be stable.  We discussed starting magnesium  supplements to see if that will improve his magnesium  level.  If not, we can give him magnesium  through an IV.  We discussed side effects to magnesium  including diarrhea and GI upset.  He will let us  know if that occurs. - If he does not tolerate, we would stop treatments and refer him to SBRT for the liver lesion to slow down progression.  He wants radiation to be done close to home in [place]. -Dr. Rogers would also like for him to have genetic screening and counseling.-He will have his labs drawn today.

## 2024-03-18 ENCOUNTER — Other Ambulatory Visit: Payer: Self-pay

## 2024-03-18 ENCOUNTER — Inpatient Hospital Stay

## 2024-03-18 ENCOUNTER — Telehealth: Payer: Self-pay | Admitting: Licensed Clinical Social Worker

## 2024-03-18 ENCOUNTER — Inpatient Hospital Stay (HOSPITAL_BASED_OUTPATIENT_CLINIC_OR_DEPARTMENT_OTHER): Admitting: Oncology

## 2024-03-18 ENCOUNTER — Encounter: Payer: Self-pay | Admitting: Licensed Clinical Social Worker

## 2024-03-18 ENCOUNTER — Ambulatory Visit: Admitting: Licensed Clinical Social Worker

## 2024-03-18 VITALS — BP 136/60 | HR 54 | Temp 97.4°F | Resp 18 | Wt 201.0 lb

## 2024-03-18 DIAGNOSIS — M545 Low back pain, unspecified: Secondary | ICD-10-CM

## 2024-03-18 DIAGNOSIS — C187 Malignant neoplasm of sigmoid colon: Secondary | ICD-10-CM

## 2024-03-18 DIAGNOSIS — N189 Chronic kidney disease, unspecified: Secondary | ICD-10-CM | POA: Diagnosis not present

## 2024-03-18 DIAGNOSIS — D631 Anemia in chronic kidney disease: Secondary | ICD-10-CM

## 2024-03-18 DIAGNOSIS — G8929 Other chronic pain: Secondary | ICD-10-CM

## 2024-03-18 DIAGNOSIS — Z8052 Family history of malignant neoplasm of bladder: Secondary | ICD-10-CM

## 2024-03-18 DIAGNOSIS — Z86718 Personal history of other venous thrombosis and embolism: Secondary | ICD-10-CM

## 2024-03-18 DIAGNOSIS — Z8049 Family history of malignant neoplasm of other genital organs: Secondary | ICD-10-CM

## 2024-03-18 DIAGNOSIS — Z5112 Encounter for antineoplastic immunotherapy: Secondary | ICD-10-CM | POA: Diagnosis not present

## 2024-03-18 LAB — COMPREHENSIVE METABOLIC PANEL WITH GFR
ALT: 31 U/L (ref 0–44)
AST: 27 U/L (ref 15–41)
Albumin: 3.2 g/dL — ABNORMAL LOW (ref 3.5–5.0)
Alkaline Phosphatase: 126 U/L (ref 38–126)
Anion gap: 9 (ref 5–15)
BUN: 34 mg/dL — ABNORMAL HIGH (ref 8–23)
CO2: 22 mmol/L (ref 22–32)
Calcium: 8.6 mg/dL — ABNORMAL LOW (ref 8.9–10.3)
Chloride: 109 mmol/L (ref 98–111)
Creatinine, Ser: 1.47 mg/dL — ABNORMAL HIGH (ref 0.61–1.24)
GFR, Estimated: 46 mL/min — ABNORMAL LOW (ref >60)
Glucose, Bld: 125 mg/dL — ABNORMAL HIGH (ref 70–99)
Potassium: 3.9 mmol/L (ref 3.5–5.1)
Sodium: 140 mmol/L (ref 135–145)
Total Bilirubin: 1.5 mg/dL — ABNORMAL HIGH (ref 0.0–1.2)
Total Protein: 6.1 g/dL — ABNORMAL LOW (ref 6.5–8.1)

## 2024-03-18 LAB — CBC WITH DIFFERENTIAL/PLATELET
Abs Immature Granulocytes: 0.02 K/uL (ref 0.00–0.07)
Basophils Absolute: 0 K/uL (ref 0.0–0.1)
Basophils Relative: 0 %
Eosinophils Absolute: 0.2 K/uL (ref 0.0–0.5)
Eosinophils Relative: 3 %
HCT: 32.6 % — ABNORMAL LOW (ref 39.0–52.0)
Hemoglobin: 10.8 g/dL — ABNORMAL LOW (ref 13.0–17.0)
Immature Granulocytes: 0 %
Lymphocytes Relative: 12 %
Lymphs Abs: 0.8 K/uL (ref 0.7–4.0)
MCH: 33.5 pg (ref 26.0–34.0)
MCHC: 33.1 g/dL (ref 30.0–36.0)
MCV: 101.2 fL — ABNORMAL HIGH (ref 80.0–100.0)
Monocytes Absolute: 0.6 K/uL (ref 0.1–1.0)
Monocytes Relative: 9 %
Neutro Abs: 5.3 K/uL (ref 1.7–7.7)
Neutrophils Relative %: 76 %
Platelets: 126 K/uL — ABNORMAL LOW (ref 150–400)
RBC: 3.22 MIL/uL — ABNORMAL LOW (ref 4.22–5.81)
RDW: 13.6 % (ref 11.5–15.5)
WBC: 7 K/uL (ref 4.0–10.5)
nRBC: 0 % (ref 0.0–0.2)

## 2024-03-18 LAB — GENETIC SCREENING ORDER

## 2024-03-18 LAB — MAGNESIUM: Magnesium: 1.4 mg/dL — ABNORMAL LOW (ref 1.7–2.4)

## 2024-03-18 MED ORDER — MAGNESIUM OXIDE -MG SUPPLEMENT 400 (240 MG) MG PO TABS: 400.0000 mg | ORAL_TABLET | Freq: Every day | ORAL | 0 refills | Status: DC

## 2024-03-18 NOTE — Progress Notes (Signed)
 REFERRING PROVIDER: Geofm Delon BRAVO, NP [address] [place],  [place] [address]  PRIMARY PROVIDER:  Meade Bigness, MD  PRIMARY REASON FOR VISIT:  1. Cancer of sigmoid colon (HCC)   2. Family history of uterine cancer   3. Family history of bladder cancer    I connected with Herbert Wood on 03/18/2024 at 2:30 PM EDT by telephone and verified that I am speaking with the correct person using two identifiers.    Patient location: home Provider location: [facility]  HISTORY OF PRESENT ILLNESS:   Herbert Wood, a 89 y.o. male, was seen for a [place] cancer genetics consultation due to a personal and family history of cancer.  Herbert Wood presents to clinic today to discuss the possibility of a hereditary predisposition to cancer, genetic testing, and to further clarify his future cancer risks, as well as potential cancer risks for family members.   CANCER HISTORY:  Oncology History  Cancer of sigmoid colon (HCC)  01/25/2022 Initial Diagnosis   Cancer of sigmoid colon (HCC)   01/25/2022 Cancer Staging   Staging form: Colon and Rectum, AJCC 8th Edition - Clinical stage from 01/25/2022: Stage IIA (cT3, cN0, cM0) - Signed by Rogers Hai, MD on 03/16/2022 Histopathologic type: Adenocarcinoma, NOS Stage prefix: Initial diagnosis Total positive nodes: 0 Total nodes examined: 21 Histologic grade (G): G2 Histologic grading system: 4 grade system Microsatellite instability (MSI): Stable   01/17/2024 Cancer Staging   Staging form: Colon and Rectum, AJCC 8th Edition - Pathologic stage from 01/17/2024: Stage IVB (rpTX, rpNX, rcM1b) - Signed by Rogers Hai, MD on 01/17/2024 Stage prefix: Recurrence   03/06/2024 -  Chemotherapy   Patient is on Treatment Plan : COLORECTAL Capecitabine  D1-14 + Bevacizumab  D1 q21d     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)  12/27/2022 Initial Diagnosis   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)   03/06/2024 -  Chemotherapy   Patient is on  Treatment Plan : COLORECTAL Capecitabine  D1-14 + Bevacizumab  D1 q21d      In 2023, at the age of 17, Herbert Wood was diagnosed with colon cancer. This was treated with left hemicolectomy. He had recurrence of this in 2025 and is being treated with chemotherapy. He had [facility] tumor profiling that showed several APC mutations, an ATM mutation and SMAD4 mutation.   Patient also reports history of rare lip cancer treated at [facility] with surgery and radiation.   Past Medical History:  Diagnosis Date   Adenomatous colon polyp    Anemia    Carotid bruit    Chronic dyspnea    Diabetes (HCC)    type 2   Diverticular disease    DVT (deep venous thrombosis) (HCC)    lower limb   Dyspnea    Factor V deficiency (HCC)    GERD (gastroesophageal reflux disease)    Hearing loss    bilateral   Hyperlipemia    Hypertension    Inguinal hernia    Kidney disease    stage 3   Lung nodule    Lymphadenopathy    Malignant neoplasm (HCC)    salivary gland   Murmur    Osteoarthritis    Senile purpura (HCC)     Past Surgical History:  Procedure Laterality Date   BIOPSY  01/10/2022   Procedure: BIOPSY;  Surgeon: Eartha Angelia Sieving, MD;  [facility];  Service: Gastroenterology;;   COLON SURGERY     COLONOSCOPY WITH PROPOFOL  N/A 01/10/2022   Procedure:  COLONOSCOPY WITH PROPOFOL ;  Surgeon: Eartha Flavors, Toribio, MD;  Location: [facility];  Service: Gastroenterology;  Laterality: N/A;  per Anette Caldron, pt to arrive at 9:15   COLONOSCOPY WITH PROPOFOL  N/A 02/27/2023   Procedure: COLONOSCOPY WITH PROPOFOL ;  Surgeon: Eartha Flavors Toribio, MD;  [facility]: [facility];  Service: Gastroenterology;  Laterality: N/A;  9:15am;asa 3   ENDOSCOPIC RETROGRADE CHOLANGIOPANCREATOGRAPHY (ERCP) WITH PROPOFOL  N/A 09/20/2023   Procedure: ENDOSCOPIC RETROGRADE CHOLANGIOPANCREATOGRAPHY (ERCP) WITH PROPOFOL ;  Surgeon: Charlanne Groom, MD;  Location: [facility] ENDOSCOPY;  Service: Gastroenterology;   Laterality: N/A;   ESOPHAGOGASTRODUODENOSCOPY (EGD) WITH PROPOFOL  N/A 01/10/2022   Procedure: ESOPHAGOGASTRODUODENOSCOPY (EGD) WITH PROPOFOL ;  Surgeon: Eartha Flavors Toribio, MD;  [facility];  Service: Gastroenterology;  Laterality: N/A;   HAND / FINGER LESION EXCISION     HERNIA REPAIR     IR RADIOLOGIST EVAL & MGMT  04/04/2023   IR RADIOLOGIST EVAL & MGMT  08/09/2023   IR RADIOLOGIST EVAL & MGMT  02/18/2024   MASTOIDECTOMY     PILONIDAL CYST EXCISION     POLYPECTOMY  01/10/2022   Procedure: POLYPECTOMY;  Surgeon: Eartha Flavors Toribio, MD;  [facility];  Service: Gastroenterology;;   POLYPECTOMY  02/27/2023   Procedure: POLYPECTOMY;  Surgeon: Eartha Flavors Toribio, MD;  [facility]: [facility];  Service: Gastroenterology;;   RADIOLOGY WITH ANESTHESIA N/A 12/27/2022   Procedure: CT microwave ablation of the liver;  Surgeon: Luverne Aran, MD;  Location: [facility];  Service: Radiology;  Laterality: N/A;   RADIOLOGY WITH ANESTHESIA N/A 10/10/2023   Procedure: CT WITH ANESTHESIA MICROWAVE ABLATION OF LIVER;  Surgeon: Luverne Aran, MD;  Location: [facility];  Service: Radiology;  Laterality: N/A;   REMOVAL OF STONES  09/20/2023   Procedure: REMOVAL OF STONES;  Surgeon: Charlanne Groom, MD;  Location: [facility] ENDOSCOPY;  Service: Gastroenterology;;   ANNETT  09/20/2023   Procedure: ANNETT;  Surgeon: Charlanne Groom, MD;  Location[facility] ENDOSCOPY;  Service: Gastroenterology;;   ANNETT  09/20/2023   Procedure: OLEVIA;  Surgeon: Charlanne Groom, MD;  Location: [facility] ENDOSCOPY;  Service: Gastroenterology;;   SUBMUCOSAL TATTOO INJECTION  01/10/2022   Procedure: SUBMUCOSAL TATTOO INJECTION;  Surgeon: Eartha Flavors Toribio, MD;  [facility];  Service: Gastroenterology;;   SURGERY OF LIP     skin cancer    FAMILY HISTORY:  We obtained a detailed, 4-generation family history.  Significant diagnoses are listed below: Family History   Problem Relation Age of Onset   Uterine cancer Mother        dx 22s   Bladder Cancer Mother        dx 5s   Herbert Wood has 1 son, 35 and 1 daughter, 99. He does not have siblings.  Herbert Wood mother had uterine cancer in her 30s and bladder cancer in her 39s. No other known cancers on this side of the family.  Herbert Wood father passed at 83. Limited info on this side of the family, no known cancers.   Herbert Wood is unaware of previous family history of genetic testing for hereditary cancer risks. There is no reported Ashkenazi Jewish ancestry. There is no known consanguinity.    GENETIC COUNSELING ASSESSMENT: Herbert Wood is a 89 y.o. male with a personal and family history of cancer which is somewhat suggestive of a hereditary cancer syndrome and predisposition to cancer. We, therefore, discussed and recommended the following at today's visit.   DISCUSSION: We discussed that approximately 10% of colon cancer is hereditary.  Most cases of hereditary colon cancer are associated with Lynch syndrome genes, although there are other genes associated with hereditary cancer as well including APC and ATM, both of which had mutations identified in his tumor profiling. Cancers and risks are gene specific. We discussed that testing is beneficial for several reasons including knowing about cancer risks, identifying potential screening and risk-reduction options that may be appropriate, and to understand if other family members could be at risk for cancer and allow them to undergo genetic testing.   We reviewed the characteristics, features and inheritance patterns of hereditary cancer syndromes. We also discussed genetic testing, including the appropriate family members to test, the process of testing, insurance coverage and turn-around-time for results. We discussed the implications of a negative, positive and/or variant of uncertain significant result. We recommended Herbert Wood pursue genetic testing for the  [facility] CancerNext+RNA gene panel.   The [facility] CancerNext+RNAinsight Panel includes sequencing, rearrangement analysis, and RNA analysis for the following 40 genes: APC, ATM, BAP1, BARD1, BMPR1A, BRCA1, BRCA2, BRIP1, CDH1, CDKN2A, CHEK2, FH, FLCN, MET, MLH1, MSH2, MSH6, MUTYH, NF1, NTHL1, PALB2, PMS2, PTEN, RAD51C, RAD51D, RPS20, SMAD4, STK11, TP53, TSC1, TSC2, and VHL (sequencing and deletion/duplication); AXIN2, HOXB13, MBD4, MSH3, POLD1 and POLE (sequencing only); EPCAM and GREM1 (deletion/duplication only).  Based on Herbert Wood personal and family history of cancer, he meets medical criteria for genetic testing. Despite that he meets criteria, he may still have an out of pocket cost.   PLAN: After considering the risks, benefits, and limitations, Herbert Wood provided informed consent to pursue genetic testing and the blood sample was sent to [facility] for analysis of the CancerNext+RNA Panel. Results should be available within approximately 2-3 weeks' time, at which point they will be disclosed by telephone to Herbert Wood, as will any additional recommendations warranted by these results. Herbert Wood will receive a summary of his genetic counseling visit and a copy of his results once available. This information will also be available in Epic.   Herbert Wood questions were answered to his satisfaction today. Our contact information was provided should additional questions or concerns arise. Thank you for the referral and allowing us  to share in the care of your patient.   Dena Cary, MS, [facility] Genetic Counselor [email or web address] Phone: [phone]  30 minutes were spent on the date of the encounter in service to the patient including preparation, virtual consultation, documentation and care coordination. Patient's wife was present. Dr. Delinda was available for discussion regarding this case.   _______________________________________________________________________ For  Office Staff:  Number of people involved in session: 2 Was an Intern/ student involved with case: no

## 2024-03-18 NOTE — Telephone Encounter (Signed)
 Called patient to see if he'd be willing to move his appointment up since he had his blood drawn for genetic testing today. Patient and wife agreed and we did a telephone genetic counseling visit today.    Dena Cary, MS, [facility] Genetic Counselor [email or web address] Phone: [phone]

## 2024-03-18 NOTE — Progress Notes (Signed)
 Specialty Pharmacy Ongoing Clinical Assessment Note  I spoke with the patient's wife. Herbert Wood is a 89 y.o. male who is being followed by the specialty pharmacy service for RxSp Oncology   Patient's specialty medication(s) reviewed today: No data recorded  Missed doses in the last 4 weeks: 0   Patient/Caregiver did not have any additional questions or concerns.   Therapeutic benefit summary: Patient is achieving benefit   Adverse events/side effects summary: No adverse events/side effects   Patient's therapy is appropriate to: Continue    Goals Addressed             This Visit's Progress    Slow Disease Progression   No change    Patient is initiating therapy. Patient will maintain adherence         Follow up: 3 months  Silvano LOISE Dolly Specialty Pharmacist

## 2024-03-23 ENCOUNTER — Encounter: Payer: Self-pay | Admitting: Oncology

## 2024-03-26 ENCOUNTER — Other Ambulatory Visit: Payer: Self-pay

## 2024-03-26 ENCOUNTER — Telehealth: Payer: Self-pay | Admitting: Licensed Clinical Social Worker

## 2024-03-26 ENCOUNTER — Encounter: Payer: Self-pay | Admitting: Licensed Clinical Social Worker

## 2024-03-26 ENCOUNTER — Ambulatory Visit: Payer: Self-pay | Admitting: Licensed Clinical Social Worker

## 2024-03-26 DIAGNOSIS — C187 Malignant neoplasm of sigmoid colon: Secondary | ICD-10-CM

## 2024-03-26 DIAGNOSIS — Z1379 Encounter for other screening for genetic and chromosomal anomalies: Secondary | ICD-10-CM | POA: Insufficient documentation

## 2024-03-26 NOTE — Progress Notes (Signed)
 HPI:   Mr. Benzel was previously seen in the [facility] due to a personal and family history of cancer and concerns regarding a hereditary predisposition to cancer. Please refer to our prior cancer genetics clinic note for more information regarding our discussion, assessment and recommendations, at the time. Mr. Seavey recent genetic test results were disclosed to him, as were recommendations warranted by these results. These results and recommendations are discussed in more detail below.  CANCER HISTORY:  Oncology History  Cancer of sigmoid colon (HCC)  01/25/2022 Initial Diagnosis   Cancer of sigmoid colon (HCC)   01/25/2022 Cancer Staging   Staging form: Colon and Rectum, AJCC 8th Edition - Clinical stage from 01/25/2022: Stage IIA (cT3, cN0, cM0) - Signed by Rogers Hai, MD on 03/16/2022 Histopathologic type: Adenocarcinoma, NOS Stage prefix: Initial diagnosis Total positive nodes: 0 Total nodes examined: 21 Histologic grade (G): G2 Histologic grading system: 4 grade system Microsatellite instability (MSI): Stable   01/17/2024 Cancer Staging   Staging form: Colon and Rectum, AJCC 8th Edition - Pathologic stage from 01/17/2024: Stage IVB (rpTX, rpNX, rcM1b) - Signed by Rogers Hai, MD on 01/17/2024 Stage prefix: Recurrence   03/06/2024 -  Chemotherapy   Patient is on Treatment Plan : COLORECTAL Capecitabine  D1-14 + Bevacizumab  D1 q21d     03/25/2024 Genetic Testing   Negative genetic testing. No pathogenic variants identified on the [facility]+RNA Panel. The report date is 03/25/2024.  The [facility]+RNAinsight Panel includes sequencing, rearrangement analysis, and RNA analysis for the following 40 genes: APC, ATM, BAP1, BARD1, BMPR1A, BRCA1, BRCA2, BRIP1, CDH1, CDKN2A, CHEK2, FH, FLCN, MET, MLH1, MSH2, MSH6, MUTYH, NF1, NTHL1, PALB2, PMS2, PTEN, RAD51C, RAD51D, RPS20, SMAD4, STK11, TP53, TSC1, TSC2, and VHL (sequencing and  deletion/duplication); AXIN2, HOXB13, MBD4, MSH3, POLD1 and POLE (sequencing only); EPCAM and GREM1 (deletion/duplication only).   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 No pathogenic variants identified on the [facility]+RNA Panel. The report date is 03/25/2024.  The [facility] CancerNext+RNAinsight Panel includes sequencing, rearrangement analysis, and RNA analysis for the following 40 genes: APC, ATM, BAP1, BARD1, BMPR1A, BRCA1, BRCA2, BRIP1, CDH1, CDKN2A, CHEK2, FH, FLCN, MET, MLH1, MSH2, MSH6, MUTYH, NF1, NTHL1, PALB2, PMS2, PTEN, RAD51C, RAD51D, RPS20, SMAD4, STK11, TP53, TSC1, TSC2, and VHL (sequencing and deletion/duplication); AXIN2, HOXB13, MBD4, MSH3, POLD1 and POLE (sequencing only); EPCAM and GREM1 (deletion/duplication only).     FAMILY HISTORY:  We obtained a detailed, 4-generation family history.  Significant diagnoses are listed below: Family History  Problem Relation Age of Onset   Uterine cancer Mother        dx 70s   Bladder Cancer Mother        dx 65s    Mr. Givler has 1 son, 74 and 1 daughter, 44. He does not have siblings.   Mr. Gullikson mother had uterine cancer in her 30s and bladder cancer in her 18s. No other known cancers on this side of the family.   Mr. Senat father passed at 3. Limited info on this side of the family, no known cancers.    Mr. Valley is unaware of previous family history of genetic testing for hereditary cancer risks. There is no reported Ashkenazi Jewish ancestry. There is no known  consanguinity.     GENETIC TEST RESULTS:  The [facility] Cancer[facility]+RNA Panel found no pathogenic mutations.   The [facility] Cancer[facility]+RNAinsight Panel includes sequencing, rearrangement analysis, and RNA analysis for  the following 40 genes: APC, ATM, BAP1, BARD1, BMPR1A, BRCA1, BRCA2, BRIP1, CDH1, CDKN2A, CHEK2, FH, FLCN, MET, MLH1, MSH2, MSH6, MUTYH, NF1, NTHL1, PALB2, PMS2, PTEN, RAD51C, RAD51D, RPS20, SMAD4, STK11, TP53, TSC1, TSC2, and VHL (sequencing and deletion/duplication); AXIN2, HOXB13, MBD4, MSH3, POLD1 and POLE (sequencing only); EPCAM and GREM1 (deletion/duplication only).  The test report has been scanned into EPIC and is located under the Molecular Pathology section of the Results Review tab.  A portion of the result report is included below for reference. Genetic testing reported out on 03/25/2024.      Even though a pathogenic variant was not identified, possible explanations for the cancer in the family may include: There may be no hereditary risk for cancer in the family. The cancers in Mr. Jeffus and/or his family may be sporadic/familial or due to other genetic and environmental factors. There may be a gene mutation in one of these genes that current testing methods cannot detect but that chance is small. There could be another gene that has not yet been discovered, or that we have not yet tested, that is responsible for the cancer diagnoses in the family.  It is also possible there is a hereditary cause for the cancer in the family that Mr. Bhakta did not inherit Therefore, it is important to remain in touch with cancer genetics in the future so that we can continue to offer Mr. Rosselli the most up to date genetic testing.   ADDITIONAL GENETIC TESTING:  We discussed with Mr. Montenegro that his genetic testing was fairly extensive.  If there are additional relevant genes identified to increase cancer risk that can be analyzed in the future, we would be happy to discuss and coordinate this testing at that time.    CANCER SCREENING RECOMMENDATIONS:  Mr. Lehenbauer test result is considered negative (normal).  This means that we have not identified a hereditary cause for his personal and family history of  cancer at this time.   An individual's cancer risk and medical management are not determined by genetic test results alone. Overall cancer risk assessment incorporates additional factors, including personal medical history, family history, and any available genetic information that may result in a personalized plan for cancer prevention and surveillance. Therefore, it is recommended he continue to follow the cancer management and screening guidelines provided by his oncology and primary healthcare provider.  RECOMMENDATIONS FOR FAMILY MEMBERS:   Since he did not inherit a identifiable mutation in a cancer predisposition gene included on this panel, his children could not have inherited a known mutation from him in one of these genes. Individuals in this family might be at some increased risk of developing cancer, over the general population risk, due to the family history of cancer.  Individuals in the family should notify their providers of the family history of cancer. We recommend women in this family have a yearly mammogram beginning at age 81, or 86 years younger than the earliest onset of cancer, an annual clinical breast exam, and perform monthly breast self-exams.  Family members should have colonoscopies by at age 37, or earlier, as recommended by their providers. Other members of the family may still carry a pathogenic variant in one of these genes that Mr. Bologna did not inherit. Based on the family history, we recommend his maternal  relatives have genetic counseling and testing. Mr. Staley will let us  know if we can be of any assistance in coordinating genetic counseling and/or testing for this family member.    FOLLOW-UP:  Lastly, we discussed with Mr. Gaughan that cancer genetics is a rapidly advancing field and it is possible that new genetic tests will be appropriate for him and/or his family members in the future. We encouraged him to remain in contact with cancer genetics on an annual basis  so we can update his personal and family histories and let him know of advances in cancer genetics that may benefit this family.   Our contact number was provided. Mr. Blouch questions were answered to his satisfaction, and he knows he is welcome to call us  at anytime with additional questions or concerns.    Dena Cary, MS, [facility] Genetic Counselor [email or web address] Phone: [phone]

## 2024-03-26 NOTE — Telephone Encounter (Signed)
 I contacted Mr. Colaizzi to discuss his genetic testing results. No pathogenic variants were identified in the 40 genes analyzed. Detailed clinic note to follow.   The test report has been scanned into EPIC and is located under the Molecular Pathology section of the Results Review tab.  A portion of the result report is included below for reference.     Dena Cary, MS, [facility] Genetic Counselor [email or web address] Phone: [phone]

## 2024-03-27 ENCOUNTER — Inpatient Hospital Stay

## 2024-03-27 ENCOUNTER — Other Ambulatory Visit: Payer: Self-pay

## 2024-03-27 ENCOUNTER — Other Ambulatory Visit: Payer: Self-pay | Admitting: Hematology

## 2024-03-27 ENCOUNTER — Inpatient Hospital Stay (HOSPITAL_BASED_OUTPATIENT_CLINIC_OR_DEPARTMENT_OTHER): Admitting: Oncology

## 2024-03-27 VITALS — BP 156/57 | HR 49 | Temp 97.6°F | Resp 18

## 2024-03-27 VITALS — BP 161/62 | HR 53 | Temp 97.6°F | Resp 16 | Wt 201.8 lb

## 2024-03-27 DIAGNOSIS — C787 Secondary malignant neoplasm of liver and intrahepatic bile duct: Secondary | ICD-10-CM

## 2024-03-27 DIAGNOSIS — C187 Malignant neoplasm of sigmoid colon: Secondary | ICD-10-CM

## 2024-03-27 DIAGNOSIS — Z86718 Personal history of other venous thrombosis and embolism: Secondary | ICD-10-CM | POA: Diagnosis not present

## 2024-03-27 DIAGNOSIS — N1831 Chronic kidney disease, stage 3a: Secondary | ICD-10-CM | POA: Diagnosis not present

## 2024-03-27 DIAGNOSIS — C189 Malignant neoplasm of colon, unspecified: Secondary | ICD-10-CM

## 2024-03-27 DIAGNOSIS — Z5112 Encounter for antineoplastic immunotherapy: Secondary | ICD-10-CM | POA: Diagnosis not present

## 2024-03-27 DIAGNOSIS — D509 Iron deficiency anemia, unspecified: Secondary | ICD-10-CM | POA: Diagnosis not present

## 2024-03-27 LAB — URINALYSIS, DIPSTICK ONLY
Bilirubin Urine: NEGATIVE
Glucose, UA: NEGATIVE mg/dL
Hgb urine dipstick: NEGATIVE
Ketones, ur: NEGATIVE mg/dL
Nitrite: NEGATIVE
Protein, ur: 100 mg/dL — AB
Specific Gravity, Urine: 1.009 (ref 1.005–1.030)
pH: 6 (ref 5.0–8.0)

## 2024-03-27 LAB — CBC WITH DIFFERENTIAL/PLATELET
Abs Granulocyte: 4.1 K/uL (ref 1.5–6.5)
Abs Immature Granulocytes: 0.01 K/uL (ref 0.00–0.07)
Basophils Absolute: 0 K/uL (ref 0.0–0.1)
Basophils Relative: 1 %
Eosinophils Absolute: 0.2 K/uL (ref 0.0–0.5)
Eosinophils Relative: 4 %
HCT: 33.7 % — ABNORMAL LOW (ref 39.0–52.0)
Hemoglobin: 11.5 g/dL — ABNORMAL LOW (ref 13.0–17.0)
Immature Granulocytes: 0 %
Lymphocytes Relative: 18 %
Lymphs Abs: 1.1 K/uL (ref 0.7–4.0)
MCH: 35.1 pg — ABNORMAL HIGH (ref 26.0–34.0)
MCHC: 34.1 g/dL (ref 30.0–36.0)
MCV: 102.7 fL — ABNORMAL HIGH (ref 80.0–100.0)
Monocytes Absolute: 0.7 K/uL (ref 0.1–1.0)
Monocytes Relative: 11 %
Neutro Abs: 4.1 K/uL (ref 1.7–7.7)
Neutrophils Relative %: 66 %
Platelets: 147 K/uL — ABNORMAL LOW (ref 150–400)
RBC: 3.28 MIL/uL — ABNORMAL LOW (ref 4.22–5.81)
RDW: 15.1 % (ref 11.5–15.5)
WBC: 6.1 K/uL (ref 4.0–10.5)
nRBC: 0 % (ref 0.0–0.2)

## 2024-03-27 LAB — COMPREHENSIVE METABOLIC PANEL WITH GFR
ALT: 25 U/L (ref 0–44)
AST: 29 U/L (ref 15–41)
Albumin: 3.4 g/dL — ABNORMAL LOW (ref 3.5–5.0)
Alkaline Phosphatase: 123 U/L (ref 38–126)
Anion gap: 10 (ref 5–15)
BUN: 34 mg/dL — ABNORMAL HIGH (ref 8–23)
CO2: 22 mmol/L (ref 22–32)
Calcium: 8.9 mg/dL (ref 8.9–10.3)
Chloride: 106 mmol/L (ref 98–111)
Creatinine, Ser: 1.5 mg/dL — ABNORMAL HIGH (ref 0.61–1.24)
GFR, Estimated: 45 mL/min — ABNORMAL LOW (ref >60)
Glucose, Bld: 107 mg/dL — ABNORMAL HIGH (ref 70–99)
Potassium: 4.3 mmol/L (ref 3.5–5.1)
Sodium: 138 mmol/L (ref 135–145)
Total Bilirubin: 1.4 mg/dL — ABNORMAL HIGH (ref 0.0–1.2)
Total Protein: 6.5 g/dL (ref 6.5–8.1)

## 2024-03-27 LAB — MAGNESIUM: Magnesium: 1.7 mg/dL (ref 1.7–2.4)

## 2024-03-27 MED ORDER — SODIUM CHLORIDE 0.9 % IV SOLN
INTRAVENOUS | Status: DC
Start: 2024-03-27 — End: 2024-03-27

## 2024-03-27 MED ORDER — SODIUM CHLORIDE 0.9 % IV SOLN
7.5000 mg/kg | Freq: Once | INTRAVENOUS | Status: AC
  Administered 2024-03-27: 700 mg via INTRAVENOUS
  Filled 2024-03-27: qty 16

## 2024-03-27 NOTE — Progress Notes (Signed)
 Ok to treat with BP 161/62  T.O. Dr Ivery Molt, PharmD

## 2024-03-27 NOTE — Progress Notes (Signed)
 Patient presents today for chemotherapy infusion. Patient is in satisfactory condition with no new complaints voiced.  Vital signs are stable.  Labs reviewed by Dr. Davonna during the office visit and all labs are within treatment parameters. Patient's BP noted to be 161/62 today. We will proceed with treatment per MD orders.

## 2024-03-27 NOTE — Patient Instructions (Signed)
 [facility] at [facility] Discharge Instructions   You were seen and examined today by Dr. Davonna.  She reviewed the results of your lab work which are normal/stable.   Continue Xeloda  as prescribed.   We will proceed with your treatment today.   Return as scheduled.    Thank you for choosing [facility] at [facility] to provide your oncology and hematology care.  To afford each patient quality time with our provider, please arrive at least 15 minutes before your scheduled appointment time.   If you have a lab appointment with the Cancer Center please come in thru the Main Entrance and check in at the main information desk.  You need to re-schedule your appointment should you arrive 10 or more minutes late.  We strive to give you quality time with our providers, and arriving late affects you and other patients whose appointments are after yours.  Also, if you no show three or more times for appointments you may be dismissed from the clinic at the providers discretion.     Again, thank you for choosing [facility].  Our hope is that these requests will decrease the amount of time that you wait before being seen by our physicians.       _____________________________________________________________  Should you have questions after your visit to [facility], please contact our office at [phone] and follow the prompts.  Our office hours are 8:00 a.m. and 4:30 p.m. Monday - Friday.  Please note that voicemails left after 4:00 p.m. may not be returned until the following business day.  We are closed weekends and major holidays.  You do have access to a nurse 24-7, just call the main number to the clinic [phone] and do not press any options, hold on the line and a nurse will answer the phone.    For prescription refill requests, have your pharmacy contact our office and allow 72 hours.    Due to Covid, you will  need to wear a mask upon entering the hospital. If you do not have a mask, a mask will be given to you at the Main Entrance upon arrival. For doctor visits, patients may have 1 support person age 82 or older with them. For treatment visits, patients can not have anyone with them due to social distancing guidelines and our immunocompromised population.

## 2024-03-27 NOTE — Patient Instructions (Signed)
 [facility] [place] - A DEPT OF [facility]  Discharge Instructions: Thank you for choosing [facility] to provide your oncology and hematology care.  If you have a lab appointment with the Cancer Center - please note that after April 8th, 2024, all labs will be drawn in the cancer center.  You do not have to check in or register with the main entrance as you have in the past but will complete your check-in in the cancer center.  Wear comfortable clothing and clothing appropriate for easy access to any Portacath or PICC line.   We strive to give you quality time with your provider. You may need to reschedule your appointment if you arrive late (15 or more minutes).  Arriving late affects you and other patients whose appointments are after yours.  Also, if you miss three or more appointments without notifying the office, you may be dismissed from the clinic at the provider's discretion.      For prescription refill requests, have your pharmacy contact our office and allow 72 hours for refills to be completed.    Today you received the following chemotherapy and/or immunotherapy agents Avastin . Bevacizumab  Injection What is this medication? BEVACIZUMAB  (be va SIZ yoo mab) treats some types of cancer. It works by blocking a protein that causes cancer cells to grow and multiply. This helps to slow or stop the spread of cancer cells. It is a monoclonal antibody. This medicine may be used for other purposes; ask your health care provider or pharmacist if you have questions. COMMON BRAND NAME(S): Alymsys , Avastin , MVASI , Vegzalma, Zirabev  What should I tell my care team before I take this medication? They need to know if you have any of these conditions: Blood clots Coughing up blood Having or recent surgery Heart failure High blood pressure History of a connection between 2 or more body parts that do not usually connect (fistula) History of a tear in your  stomach or intestines Protein in your urine An unusual or allergic reaction to bevacizumab , other medications, foods, dyes, or preservatives Pregnant or trying to get pregnant Breast-feeding How should I use this medication? This medication is injected into a vein. It is given by your care team in a hospital or clinic setting. Talk to your care team the use of this medication in children. Special care may be needed. Overdosage: If you think you have taken too much of this medicine contact a poison control center or emergency room at once. NOTE: This medicine is only for you. Do not share this medicine with others. What if I miss a dose? Keep appointments for follow-up doses. It is important not to miss your dose. Call your care team if you are unable to keep an appointment. What may interact with this medication? Interactions are not expected. This list may not describe all possible interactions. Give your health care provider a list of all the medicines, herbs, non-prescription drugs, or dietary supplements you use. Also tell them if you smoke, drink alcohol, or use illegal drugs. Some items may interact with your medicine. What should I watch for while using this medication? Your condition will be monitored carefully while you are receiving this medication. You may need blood work while taking this medication. This medication may make you feel generally unwell. This is not uncommon as chemotherapy can affect healthy cells as well as cancer cells. Report any side effects. Continue your course of treatment even though you feel ill  unless your care team tells you to stop. This medication may increase your risk to bruise or bleed. Call your care team if you notice any unusual bleeding. Before having surgery, talk to your care team to make sure it is ok. This medication can increase the risk of poor healing of your surgical site or wound. You will need to stop this medication for 28 days before  surgery. After surgery, wait at least 28 days before restarting this medication. Make sure the surgical site or wound is healed enough before restarting this medication. Talk to your care team if questions. Talk to your care team if you may be pregnant. Serious birth defects can occur if you take this medication during pregnancy and for 6 months after the last dose. Contraception is recommended while taking this medication and for 6 months after the last dose. Your care team can help you find the option that works for you. Do not breastfeed while taking this medication and for 6 months after the last dose. This medication can cause infertility. Talk to your care team if you are concerned about your fertility. What side effects may I notice from receiving this medication? Side effects that you should report to your care team as soon as possible: Allergic reactions--skin rash, itching, hives, swelling of the face, lips, tongue, or throat Bleeding--bloody or black, tar-like stools, vomiting blood or brown material that looks like coffee grounds, red or dark brown urine, small red or purple spots on skin, unusual bruising or bleeding Blood clot--pain, swelling, or warmth in the leg, shortness of breath, chest pain Heart attack--pain or tightness in the chest, shoulders, arms, or jaw, nausea, shortness of breath, cold or clammy skin, feeling faint or lightheaded Heart failure--shortness of breath, swelling of the ankles, feet, or hands, sudden weight gain, unusual weakness or fatigue Increase in blood pressure Infection--fever, chills, cough, sore throat, wounds that don't heal, pain or trouble when passing urine, general feeling of discomfort or being unwell Infusion reactions--chest pain, shortness of breath or trouble breathing, feeling faint or lightheaded Kidney injury--decrease in the amount of urine, swelling of the ankles, hands, or feet Stomach pain that is severe, does not go away, or gets  worse Stroke--sudden numbness or weakness of the face, arm, or leg, trouble speaking, confusion, trouble walking, loss of balance or coordination, dizziness, severe headache, change in vision Sudden and severe headache, confusion, change in vision, seizures, which may be signs of posterior reversible encephalopathy syndrome (PRES) Side effects that usually do not require medical attention (report to your care team if they continue or are bothersome): Back pain Change in taste Diarrhea Dry skin Increased tears Nosebleed This list may not describe all possible side effects. Call your doctor for medical advice about side effects. You may report side effects to FDA at 1-800-FDA-1088. Where should I keep my medication? This medication is given in a hospital or clinic. It will not be stored at home. NOTE: This sheet is a summary. It may not cover all possible information. If you have questions about this medicine, talk to your doctor, pharmacist, or health care provider.  2024 Elsevier/Gold Standard (2021-12-02 00:00:00)      To help prevent nausea and vomiting after your treatment, we encourage you to take your nausea medication as directed.  BELOW ARE SYMPTOMS THAT SHOULD BE REPORTED IMMEDIATELY: *FEVER GREATER THAN 100.4 F (38 C) OR HIGHER *CHILLS OR SWEATING *NAUSEA AND VOMITING THAT IS NOT CONTROLLED WITH YOUR NAUSEA MEDICATION *UNUSUAL SHORTNESS OF BREATH *  UNUSUAL BRUISING OR BLEEDING *URINARY PROBLEMS (pain or burning when urinating, or frequent urination) *BOWEL PROBLEMS (unusual diarrhea, constipation, pain near the anus) TENDERNESS IN MOUTH AND THROAT WITH OR WITHOUT PRESENCE OF ULCERS (sore throat, sores in mouth, or a toothache) UNUSUAL RASH, SWELLING OR PAIN  UNUSUAL VAGINAL DISCHARGE OR ITCHING   Items with * indicate a potential emergency and should be followed up as soon as possible or go to the Emergency Department if any problems should occur.  Please show the  CHEMOTHERAPY ALERT CARD or IMMUNOTHERAPY ALERT CARD at check-in to the Emergency Department and triage nurse.  Should you have questions after your visit or need to cancel or reschedule your appointment, please contact [facility] [place] - A DEPT OF [facility] [phone]  and follow the prompts.  Office hours are 8:00 a.m. to 4:30 p.m. Monday - Friday. Please note that voicemails left after 4:00 p.m. may not be returned until the following business day.  We are closed weekends and major holidays. You have access to a nurse at all times for urgent questions. Please call the main number to the clinic [phone] and follow the prompts.  For any non-urgent questions, you may also contact your provider using MyChart. We now offer e-Visits for anyone 89 and older to request care online for non-urgent symptoms. For details visit [email or web address].   Also download the MyChart app! Go to the app store, search MyChart, open the app, select [place], and log in with your MyChart username and password.

## 2024-03-27 NOTE — Progress Notes (Addendum)
 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 male was transferred to my care after his prior physician has left.   ASSESSMENT & PLAN:   Assessment & Plan: Herbert Wood  is a 89 y.o. 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  New lung metastasis. Patient is currently on capecitabine  and bevacizumab .  Because of advanced age FOLFOX was not preferred  - Patient has seen Dr. Luverne for a Y90.  Because of the size of the lesions was recommended systemic treatment and can be considered for Y90 or SBRT later down the line. - Labs reviewed today: Creatinine: 1.5(stable), CBC: Hemoglobin: 11.5(improved from prior), latest CEA of 36.9. -CEA improved from prior.  Will repeat CEA with every cycle - Will repeat PET scan next month.  Will order today. - Continue capecitabine  at 1000 mg twice daily and bevacizumab  every 3 weeks.  Reports no side effects from this. - Recommended using urea cream and diclofenac cream for hands and feet to avoid hand-foot syndrome  Return to clinic in 3 weeks with labs and PET scan  Iron deficiency anemia, unspecified iron deficiency anemia type Patient has improved hemoglobin at this time Last iron labs within normal limits  - Will continue to monitor at this time every 3 months History of DVT (deep vein thrombosis) Patient has a history of left leg DVT maybe around 2020.  Patient's  wife thought it was likely after second COVID infection Patient also has a documented history of factor V Leiden deficiency.  I could not find this report.  -Considering current metastatic colon cancer, continue Eliquis  5mg  twice daily. Stage 3a chronic kidney disease (HCC) Patient has a history of CKD which is more or less stable at this time.  - Managed by patient's primary care    The patient understands the plans discussed today and is in agreement with them.  He knows to contact our office if he develops concerns prior to his next appointment.  90 minutes of total time was spent for this patient encounter, including preparation, face-to-face counseling with the patient and coordination of care, physical exam, and documentation of the encounter. > 50% of the time was spent on counseling as documented under my assessment and plan.    LILLETTE Verneta SAUNDERS Teague,acting as a Neurosurgeon for [name], MD.,have documented all relevant documentation on the behalf of [name], MD,as directed by  [name], MD while in the presence of [name], MD.  I, [name] MD, have reviewed the above documentation for accuracy and completeness, and I agree with the above.     [name], MD  [facility] [place] - A DEPT OF [facility] [address] [place] [address] Dept: [phone] Dept Fax: [phone]   No orders of the defined types were placed in this encounter.    ONCOLOGY HISTORY:   I have reviewed his chart and materials related to his cancer  extensively and collaborated history with the patient. 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 -01/10/2022: Colonoscopy: A fungating, ulcerated, partially obstructing mass  found at 42 cm proximal to the anus. Two 2-6 mm polyps in the transverse colon.  Pathology: Invasive moderately differentiated adenocarcinoma. -01/13/2022: CT CAP: No evidence of metastatic disease in the chest, abdomen, or pelvis. -02/17/2022: Left hemicolectomy.  Pathology: Moderately differentiated invasive adenocarcinoma in the descending colon, 5.8 cm, with clear margins of resection and negative mesenteric lymph nodes.  MMR preserved. pT3 pN0.  -07/13/2022: CT CAP:  Interval partial colectomy without evidence of local recurrence. No convincing evidence of metastatic disease within the chest, abdomen, or pelvis. -10/19/2022: CEA: 20.9 -11/09/2022: PET: Single hypermetabolic metastasis inferiorly in the right hepatic lobe. No other evidence of metastatic disease. Enlarging subcutaneous nodule posteriorly in the lower left chest with low level metabolic activity. -11/22/2022: MRI Liver: Isolated inferior right hepatic lobe 2.8 cm metastasis.  -12/27/2022: Thermal ablation of right lobe liver lesion -09/18/2023: MRI abdomen: Choledocholithiasis. Known metastasis of the inferior right lobe of the liver, hepatic segment VI. -10/08/2023: CEA: 94.8 -10/10/2023: Percutaneous microwave thermal ablation of right lobe liver mass -01/11/2024: CEA: 36.9 -01/11/2024: CT CAP: Numerous new and enlarged bilateral pulmonary nodules, consistent with worsened pulmonary metastatic disease. Expected appearance of ablation site of hepatic segment VI with overlying capsular retraction. No new liver lesions appreciated by CT. -01/11/2024: MRI liver: Interval ablation of a metastasis of hepatic segment VI. Residual or recurrent rim enhancing tumor at the superior margin of the ablation site measuring 4.1 x 3.6 x 1.8 cm. Pulmonary nodules in the included lung bases, consistent with metastatic disease -01/24/2024: PET: Hypermetabolic right hepatic lobe masses, compatible with metastatic disease. The larger lesion has  increased in size and metabolic activity compared to the prior PET scan. There is also a new smaller satellite right hepatic lobe lesion which is hypermetabolic. Scattered hypermetabolic pulmonary nodules, new compared to the prior chest CT but shown on the diagnostic CT from 01/11/2024. These are compatible with pulmonary metastases. -02/04/2024: Liver lesion biopsy.  -Pathology: Adenocarcinoma consistent with recurrent metastatic colonic adenocarcinoma.  -NGS: KRAS G13D, MSI-stable, TMB-low, HER2 (0), ATM, SMAD4, APC  -Guardant 360: KRAS G13D, K-ras K117N, ATM [id number].  UG T1A1 *28/*28 for metabolizer.  -02/26/2024 to current: Xeloda  1,000 mg BID 2 weeks on/1 week off -03/06/2024 to current: Bevacizumab  every 3 weeks -03/20/2024: Germline mutation testing: Negative   Diagnosis: Adenoid cystic carcinoma  -06/2015: Resection of left upper lip.  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 -08/11/2015: Adjuvant radiation therapy, 50 Gray, completed  Current Treatment:  Capecitabine  1000mg  BID + Bevacizumab   INTERVAL HISTORY:   Alvis Edgell is here today for follow up. Patient is accompanied by his wife. He is feeling well overall. His appetite is healthy. He is tolerating Bevacizumab  well and denies any nausea, vomiting, or bleeding issues, including hematochezia, melena, epistaxis, or bleeding gums. He states his nose is running more frequently.   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 Vision changes is not a common side effect of  Bevacizumab  and is likely related to pre-existing vision issues. Thorn is seeing his optometrist on 04/02/2024.   Andrian takes Kerendia  for low kidney function. He is not being followed by nephrology, but is instead being managed by his PCP for kidney  issues.   Eschol goes to physical therapy 3 days a week. He is independent of ADL's and IADL's.   I have reviewed the past medical history, past surgical history, social history  and family history with the patient and they are unchanged from previous note.  ALLERGIES:  is allergic to gadobenate.  MEDICATIONS:  Current Outpatient Medications  Medication Sig Dispense Refill   amitriptyline  (ELAVIL ) 10 MG tablet Take 10 mg by mouth every evening.     amLODipine  (NORVASC ) 5 MG tablet Take 5 mg by mouth 2 (two) times daily.     ascorbic acid  (VITAMIN C /NATURAL ROSE HIPS) 1000 MG tablet Take 1,000 mg by mouth every evening.     atorvastatin  (LIPITOR) 40 MG tablet Take 40 mg by mouth every evening.     carvedilol  (COREG ) 6.25 MG tablet Take 6.25 mg by mouth 2 (two) times daily with a meal. (Patient taking differently: Take 6.25 mg by mouth 2 (two) times daily with a meal. Takes half 2 times a day)     Cholecalciferol  (VITAMIN D3) 125 MCG (5000 UT) CAPS Take 5,000 Units by mouth every evening.     dorzolamide -timolol (COSOPT) 2-0.5 % ophthalmic solution Place 1 drop into both eyes 2 (two) times daily.     ELIQUIS  5 MG TABS tablet TAKE 1 TABLET(5 MG) BY MOUTH TWICE DAILY 60 tablet 3   ferrous sulfate  325 (65 FE) MG tablet Take 325 mg by mouth daily with breakfast.     folic acid  (FOLVITE ) 1 MG tablet Take 1 tablet (1 mg total) by mouth every evening. 30 tablet 0   hydrALAZINE  (APRESOLINE ) 25 MG tablet Take 50 mg by mouth in the morning and at bedtime.     KERENDIA  10 MG TABS Take 10 mg by mouth daily.     latanoprost  (XALATAN ) 0.005 % ophthalmic solution Place 1 drop into both eyes at bedtime.     linagliptin  (TRADJENTA ) 5 MG TABS tablet Take 5 mg by mouth in the morning.     loratadine  (CLARITIN ) 10 MG tablet Take 10 mg by mouth every morning.     magnesium  oxide (MAG-OX) 400 (240 Mg) MG tablet Take 1 tablet (400 mg total) by mouth daily. 90 tablet 0   Misc Natural Products (PROSTATE SUPPORT PO) Take 1 capsule by mouth every evening. NOW Prostate Support     omeprazole  (PRILOSEC) 40 MG capsule Take 1 capsule (40 mg total) by mouth daily. 90 capsule 3   ondansetron  (ZOFRAN )  4 MG tablet Take 1 tablet (4 mg total) by mouth every 6 (six) hours as needed for nausea. 20 tablet 0   ondansetron  (ZOFRAN -ODT) 4 MG disintegrating tablet 4mg  ODT q4 hours prn nausea/vomit 10 tablet 0   prochlorperazine  (COMPAZINE ) 10 MG tablet Take 1 tablet (10 mg total) by mouth every 6 (six) hours as needed for nausea or vomiting. 30 tablet 1   traMADol  (ULTRAM ) 50 MG tablet Take 1 tablet (50 mg total) by mouth at bedtime as needed. 30 tablet 0   vitamin B-12 (CYANOCOBALAMIN ) 1000 MCG tablet Take 1,000 mcg by mouth every evening. Now (Brand)     capecitabine  (XELODA ) 500 MG tablet Take 2 tablets (1,000 mg total) by mouth 2 (two) times daily after a meal. Take 2 weeks on/ 1 week off 56 tablet 1   No current facility-administered medications for this visit.    REVIEW OF SYSTEMS:   Constitutional: Denies fevers, chills or abnormal weight loss Eyes: Denies blurriness of vision Ears, nose, mouth, throat,  and face: Denies mucositis or sore throat Respiratory: Denies cough, dyspnea or wheezes Cardiovascular: Denies palpitation, chest discomfort or lower extremity swelling Gastrointestinal:  Denies nausea, heartburn or change in bowel habits Skin: Denies abnormal skin rashes Lymphatics: Denies new lymphadenopathy or easy bruising Neurological:Denies numbness, tingling or new weaknesses Behavioral/Psych: Mood is stable, no new changes  All other systems were reviewed with the patient and are negative.   VITALS:  Blood pressure (!) 161/62, pulse (!) 53, temperature 97.6 F (36.4 C), temperature source Oral, resp. rate 16, weight 201 lb 12.8 oz (91.5 kg), SpO2 98%.  Wt Readings from Last 3 Encounters:  03/27/24 201 lb 12.8 oz (91.5 kg)  03/18/24 201 lb (91.2 kg)  02/26/24 194 lb 3.6 oz (88.1 kg)    Body mass index is 28.96 kg/m.  Performance status (ECOG): 1 - Symptomatic but completely ambulatory  PHYSICAL EXAM:   GENERAL:alert, no distress and comfortable SKIN: skin color,  texture, turgor are normal, no rashes or significant lesions EYES: normal, Conjunctiva are pink and non-injected, sclera clear OROPHARYNX:no exudate, no erythema and lips, buccal mucosa, and tongue normal  NECK: supple, thyroid normal size, non-tender, without nodularity LYMPH:  no palpable lymphadenopathy in the cervical, axillary or inguinal LUNGS: clear to auscultation and percussion with normal breathing effort HEART: regular rate & rhythm and no murmurs and no lower extremity edema ABDOMEN:abdomen soft, non-tender and normal bowel sounds Musculoskeletal:no cyanosis of digits and no clubbing  NEURO: alert & oriented x 3 with fluent speech, no focal motor/sensory deficits  LABORATORY DATA:  I have reviewed the data as listed   Lab Results  Component Value Date   WBC 6.1 03/27/2024   NEUTROABS 4.1 03/27/2024   HGB 11.5 (L) 03/27/2024   HCT 33.7 (L) 03/27/2024   MCV 102.7 (H) 03/27/2024   PLT 147 (L) 03/27/2024      Chemistry      Component Value Date/Time   NA 138 03/27/2024 1226   K 4.3 03/27/2024 1226   CL 106 03/27/2024 1226   CO2 22 03/27/2024 1226   BUN 34 (H) 03/27/2024 1226   CREATININE 1.50 (H) 03/27/2024 1226      Component Value Date/Time   CALCIUM  8.9 03/27/2024 1226   ALKPHOS 123 03/27/2024 1226   AST 29 03/27/2024 1226   ALT 25 03/27/2024 1226   BILITOT 1.4 (H) 03/27/2024 1226        Latest Reference Range & Units 02/26/24 11:04  Iron 45 - 182 ug/dL 99  UIBC ug/dL 773  TIBC 749 - 549 ug/dL 674  Saturation Ratios 17.9 - 39.5 % 31  Ferritin 24 - 336 ng/mL 154  Folate >5.9 ng/mL 31.1   RADIOGRAPHIC STUDIES: I have personally reviewed the radiological images as listed and agreed with the findings in the report.  CLINICAL DATA:  Subsequent treatment strategy for metastatic colon cancer. Prior thermal ablation of hepatic metastatic disease.   EXAM: NUCLEAR MEDICINE PET SKULL BASE TO THIGH   TECHNIQUE: 10.2 mCi F-18 FDG was injected intravenously.  Full-ring PET imaging was performed from the skull base to thigh after the radiotracer. CT data was obtained and used for attenuation correction and anatomic localization.   Fasting blood glucose: 139 mg/dl   COMPARISON:  Multiple exams, including 07/19/2023   FINDINGS: Mediastinal blood pool activity: SUV max 2.2   Liver activity: SUV max NA   NECK: No significant abnormal hypermetabolic activity in this region.   Incidental CT findings: Chronic left maxillary sinusitis. Left mastoid effusion. Suspected  left otitis media.   CHEST: Scattered hypermetabolic pulmonary nodules are new compared to the prior chest CT but were shown on the diagnostic CT from 01/11/2024. Index right lower lobe lesion 1.2 cm in diameter with maximum SUV 3.2 on image 71 series 202. Index left upper lobe lesion 0.9 cm in diameter on image 51 series 202 with maximum SUV 3.4.   Incidental CT findings: Coronary, aortic arch, and branch vessel atherosclerotic vascular disease.   ABDOMEN/PELVIS: 4.9 cm hypermetabolic right hepatic lobe mass with maximum SUV 16.3. Small satellite 1.6 cm right hepatic lobe hypermetabolic nodule observed with maximum SUV 9.9. Both of these are visible on image 93 series 194. On the prior PET scan, the right hepatic lobe lesion measured 3.6 cm in diameter with maximum SUV of 11.6.   Photopenic site inferiorly in the right hepatic lobe likely from prior ablation.   Scattered physiologic activity in bowel.   Incidental CT findings: Atherosclerosis is present, including aortoiliac atherosclerotic disease. Pneumobilia. Photopenic bilateral renal cysts merit no further imaging workup. Ventral hernia containing small bowel above the umbilicus. No findings of obstruction or strangulation.   Diverticulosis in the remaining colon.  Prostatomegaly.   SKELETON: No significant abnormal hypermetabolic activity in this region.   Incidental CT findings: None.   IMPRESSION: 1.  Hypermetabolic right hepatic lobe masses, compatible with metastatic disease. The larger lesion has increased in size and metabolic activity compared to the prior PET scan. There is also a new smaller satellite right hepatic lobe lesion which is hypermetabolic. 2. Scattered hypermetabolic pulmonary nodules, new compared to the prior chest CT but shown on the diagnostic CT from 01/11/2024. These are compatible with pulmonary metastases. 3. Chronic left maxillary sinusitis. Left mastoid effusion. Suspected left otitis media. 4. Ventral hernia containing small bowel above the umbilicus, without findings of obstruction or strangulation. 5. Prostatomegaly. 6.  Aortic Atherosclerosis (ICD10-I70.0).     Electronically Signed   By: Ryan Salvage M.D.   On: 01/25/2024 14:30

## 2024-03-27 NOTE — Progress Notes (Signed)
 Patient is taking Xeloda  as prescribed. He has not missed any doses and reports no side effects at this time.    Patient has been examined by Dr. Davonna. Vital signs and labs have been reviewed by MD - ANC, Creatinine, LFTs, hemoglobin, and platelets have been reviewed by M.D. - pt may proceed with treatment.  Primary RN and pharmacy notified.

## 2024-03-27 NOTE — Progress Notes (Signed)
 Patient tolerated treatment well with no complaints voiced.  Patient left ambulatory in stable condition.  Vital signs stable at discharge.  Follow up as scheduled.

## 2024-03-28 ENCOUNTER — Encounter: Payer: Self-pay | Admitting: Oncology

## 2024-03-28 ENCOUNTER — Other Ambulatory Visit: Payer: Self-pay

## 2024-03-28 MED ORDER — CAPECITABINE 500 MG PO TABS
1000.0000 mg | ORAL_TABLET | Freq: Two times a day (BID) | ORAL | 1 refills | Status: DC
Start: 2024-03-28 — End: 2024-05-15
  Filled 2024-03-28: qty 56, 14d supply, fill #0
  Filled 2024-04-01: qty 56, 21d supply, fill #0
  Filled 2024-04-25: qty 56, 21d supply, fill #1

## 2024-03-28 NOTE — Telephone Encounter (Signed)
 Chart reviewed. Xeloda  refilled per last office note with Dr. Davonna.

## 2024-03-29 ENCOUNTER — Encounter: Payer: Self-pay | Admitting: Oncology

## 2024-03-29 NOTE — Assessment & Plan Note (Addendum)
 Patient has improved hemoglobin at this time Last iron labs within normal limits  - Will continue to monitor at this time every 3 months

## 2024-03-29 NOTE — Assessment & Plan Note (Addendum)
 Currently metastatic colon cancer-metastatic to liver Oncology history as below Initially with stage II colorectal carcinoma s/p hemicolectomy and 1 hepatic lobe lesion s/p thermal ablation  Progression in the same spot repeat thermocoagulation.  New lung metastasis. Patient is currently on capecitabine  and bevacizumab .  Because of advanced age FOLFOX was not preferred  - Patient has seen Dr. Luverne for a Y90.  Because of the size of the lesions was recommended systemic treatment and can be considered for Y90 or SBRT later down the line. - Labs reviewed today: Creatinine: 1.5(stable), CBC: Hemoglobin: 11.5(improved from prior), latest CEA of 36.9. -CEA improved from prior.  Will repeat CEA with every cycle - Will repeat PET scan next month.  Will order today. - Continue capecitabine  at 1000 mg twice daily and bevacizumab  every 3 weeks.  Reports no side effects from this. - Recommended using urea cream and diclofenac cream for hands and feet to avoid hand-foot syndrome  Return to clinic in 3 weeks with labs and PET scan

## 2024-03-29 NOTE — Assessment & Plan Note (Addendum)
 Patient has a history of left leg DVT maybe around 2020.  Patient's wife thought it was likely after second COVID infection Patient also has a documented history of factor V Leiden deficiency.  I could not find this report.  -Considering current metastatic colon cancer, continue Eliquis  5mg  twice daily.

## 2024-03-29 NOTE — Assessment & Plan Note (Addendum)
 Patient has a history of CKD which is more or less stable at this time.  - Managed by patient's primary care

## 2024-04-01 ENCOUNTER — Other Ambulatory Visit: Payer: Self-pay | Admitting: *Deleted

## 2024-04-01 ENCOUNTER — Other Ambulatory Visit: Payer: Self-pay

## 2024-04-01 ENCOUNTER — Other Ambulatory Visit (HOSPITAL_COMMUNITY): Payer: Self-pay

## 2024-04-01 DIAGNOSIS — C189 Malignant neoplasm of colon, unspecified: Secondary | ICD-10-CM

## 2024-04-02 ENCOUNTER — Other Ambulatory Visit: Payer: Self-pay

## 2024-04-02 NOTE — Progress Notes (Signed)
 Specialty Pharmacy Refill Coordination Note  Herbert Wood is a 89 y.o. male contacted today regarding refills of specialty medication(s) Capecitabine  (XELODA )   Patient requested Delivery   Delivery date: 04/11/24   Verified address: [address]  [place] [address]   Medication will be filled on 04/10/24.

## 2024-04-10 ENCOUNTER — Other Ambulatory Visit: Payer: Self-pay

## 2024-04-10 ENCOUNTER — Inpatient Hospital Stay: Admitting: Licensed Clinical Social Worker

## 2024-04-10 ENCOUNTER — Encounter (HOSPITAL_COMMUNITY)
Admission: RE | Admit: 2024-04-10 | Discharge: 2024-04-10 | Disposition: A | Discharge status: Home / Self Care | Source: Ambulatory Visit | Attending: Oncology | Admitting: Oncology

## 2024-04-10 DIAGNOSIS — C787 Secondary malignant neoplasm of liver and intrahepatic bile duct: Secondary | ICD-10-CM | POA: Insufficient documentation

## 2024-04-10 DIAGNOSIS — C189 Malignant neoplasm of colon, unspecified: Secondary | ICD-10-CM | POA: Diagnosis present

## 2024-04-10 MED ORDER — FLUDEOXYGLUCOSE F - 18 (FDG) INJECTION
9.9100 | Freq: Once | INTRAVENOUS | Status: AC | PRN
  Administered 2024-04-10: 9.91 via INTRAVENOUS

## 2024-04-14 ENCOUNTER — Telehealth: Payer: Self-pay | Admitting: Dietician

## 2024-04-14 ENCOUNTER — Inpatient Hospital Stay: Attending: Hematology | Admitting: Dietician

## 2024-04-14 DIAGNOSIS — D509 Iron deficiency anemia, unspecified: Secondary | ICD-10-CM | POA: Insufficient documentation

## 2024-04-14 DIAGNOSIS — Z86718 Personal history of other venous thrombosis and embolism: Secondary | ICD-10-CM | POA: Insufficient documentation

## 2024-04-14 DIAGNOSIS — Z7901 Long term (current) use of anticoagulants: Secondary | ICD-10-CM | POA: Insufficient documentation

## 2024-04-14 DIAGNOSIS — Z5112 Encounter for antineoplastic immunotherapy: Secondary | ICD-10-CM | POA: Insufficient documentation

## 2024-04-14 DIAGNOSIS — D6851 Activated protein C resistance: Secondary | ICD-10-CM | POA: Insufficient documentation

## 2024-04-14 DIAGNOSIS — N1831 Chronic kidney disease, stage 3a: Secondary | ICD-10-CM | POA: Insufficient documentation

## 2024-04-14 DIAGNOSIS — C187 Malignant neoplasm of sigmoid colon: Secondary | ICD-10-CM | POA: Insufficient documentation

## 2024-04-14 DIAGNOSIS — C7800 Secondary malignant neoplasm of unspecified lung: Secondary | ICD-10-CM | POA: Insufficient documentation

## 2024-04-14 DIAGNOSIS — C787 Secondary malignant neoplasm of liver and intrahepatic bile duct: Secondary | ICD-10-CM | POA: Insufficient documentation

## 2024-04-14 NOTE — Telephone Encounter (Signed)
 Nutrition Follow-up:  Pt with recurrent colon cancer metastatic to liver. S/p left hemicolectomy (02/17/22), right liver lobe thermal ablation (12/27/22), microwave ablation (10/10/23). Patient is currently receiving chemoimmunotherapy with xeloda  + beva q21d. Patient is under the care of Dr. Davonna Sermon with wife of patient via telephone. 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 Patient is taking gabapentin. His appetite is good, continues to eat well. Recalls not eating out at breakfast yesterday. He had a waffle with SF syrup. Ate 100% of large lunch (fried grouper, baked potato, slaw, hush puppies). They had small bowl of ice cream with cookies later in the evening. Did not have supper secondary to large lunch.   Wife reports he stopped drinking dark colas. Patient orders sprite or lemonade when eating out. Pt drinks one cup of decaf coffee in the morning. She would like him to drink more water. Currently drinking 24 ounces.   Bowels are moving regularly. Denies nausea or vomiting.    Medications: reviewed   Labs: 8/28 - BUN 34, Cr 1.50, albumin  3.4  Anthropometrics: Wt 201 lb 12.8 oz on 8/28 - increased   7/29 - 194 lb 3.6 oz 6/19 - 191 lb 9.3 oz    NUTRITION DIAGNOSIS: Food and nutrition related knowledge deficit improving    INTERVENTION:  Continue including protein source at every meal/snack Continue working to increase water, suggested leaving 8 oz bottles at table side     MONITORING, EVALUATION, GOAL: wt trends, intake, fluids    NEXT VISIT: Monday October 13 via telephone

## 2024-04-15 ENCOUNTER — Other Ambulatory Visit: Payer: Self-pay

## 2024-04-15 DIAGNOSIS — D509 Iron deficiency anemia, unspecified: Secondary | ICD-10-CM

## 2024-04-15 DIAGNOSIS — C187 Malignant neoplasm of sigmoid colon: Secondary | ICD-10-CM

## 2024-04-15 DIAGNOSIS — N1831 Chronic kidney disease, stage 3a: Secondary | ICD-10-CM

## 2024-04-15 DIAGNOSIS — C189 Malignant neoplasm of colon, unspecified: Secondary | ICD-10-CM

## 2024-04-15 NOTE — Progress Notes (Unsigned)
 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  New lung metastasis. Patient is currently on capecitabine  and bevacizumab .  Because of advanced age FOLFOX was not preferred Patient has seen Dr. Luverne for a Y90.  Because of the size of the lesions was recommended systemic treatment and can be considered for Y90 or SBRT later down the line. Currently on Capecitabine  and bevacizumab   - We reviewed the recent PET scan findings in detail.  Patient had significant improvement in the liver lesion in the lung nodules. Will repeat in 3 months - Labs reviewed today: Creatinine: 1.5(stable), CBC: Hemoglobin: 11.2(Stable), latest CEA pending from today. -CEA improved from prior.  Will repeat CEA with every cycle - Continue capecitabine  at 1000 mg twice daily and bevacizumab  every 3 weeks.  Reports no side effects from this. - Recommended using urea cream and diclofenac cream for hands and feet to avoid hand-foot syndrome  Return to clinic in 6 weeks with labs  for follow up  Iron deficiency anemia, unspecified iron deficiency anemia type Patient has improved hemoglobin at this time Last iron labs within normal limits  - Will continue to monitor at this time every 3 months History of DVT (deep vein thrombosis) Patient has a  history of left leg DVT maybe around 2020.  Patient's wife thought it was likely after second COVID infection Patient also has a documented history of factor V Leiden deficiency.  I could not find this report.  -Considering current metastatic colon cancer, continue Eliquis  5mg  twice daily. Stage 3a chronic kidney disease (HCC) Patient has a history of CKD which is more or less stable at this time.  - Managed by patient's primary care    The patient understands the plans discussed today and is in agreement with them.  He knows to contact our office if he develops concerns prior to his next appointment.  30 minutes of total time was spent for this patient encounter, including preparation, face-to-face counseling with the patient and coordination of care, physical exam, and documentation of the encounter. > 50% of the time was spent on counseling as documented under my assessment and plan.    [name], MD  [facility] [place] - A DEPT OF [facility] [address] [place] [address] Dept: [phone] Dept Fax: [phone]   No orders of the defined types were placed in this encounter.    ONCOLOGY HISTORY:   I have reviewed his chart and materials related to his cancer extensively and collaborated history with the patient. 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 -01/10/2022: Colonoscopy: A fungating, ulcerated, partially obstructing mass found at 42 cm  proximal to the anus. Two 2-6 mm polyps in the transverse colon.  Pathology: Invasive moderately differentiated adenocarcinoma. -01/13/2022: CT CAP: No evidence of metastatic disease in the chest, abdomen, or pelvis. -02/17/2022: Left hemicolectomy.  Pathology: Moderately  differentiated invasive adenocarcinoma in the descending colon, 5.8 cm, with clear margins of resection and negative mesenteric lymph nodes.  MMR preserved. pT3 pN0.  -07/13/2022: CT CAP:  Interval partial colectomy without evidence of local recurrence. No convincing evidence of metastatic disease within the chest, abdomen, or pelvis. -10/19/2022: CEA: 20.9 -11/09/2022: PET: Single hypermetabolic metastasis inferiorly in the right hepatic lobe. No other evidence of metastatic disease. Enlarging subcutaneous nodule posteriorly in the lower left chest with low level metabolic activity. -11/22/2022: MRI Liver: Isolated inferior right hepatic lobe 2.8 cm metastasis.  -12/27/2022: Thermal ablation of right lobe liver lesion -09/18/2023: MRI abdomen: Choledocholithiasis. Known metastasis of the inferior right lobe of the liver, hepatic segment VI. -10/08/2023: CEA: 94.8 -10/10/2023: Percutaneous microwave thermal ablation of right lobe liver mass -01/11/2024: CEA: 36.9 -01/11/2024: CT CAP: Numerous new and enlarged bilateral pulmonary nodules, consistent with worsened pulmonary metastatic disease. Expected appearance of ablation site of hepatic segment VI with overlying capsular retraction. No new liver lesions appreciated by CT. -01/11/2024: MRI liver: Interval ablation of a metastasis of hepatic segment VI. Residual or recurrent rim enhancing tumor at the superior margin of the ablation site measuring 4.1 x 3.6 x 1.8 cm. Pulmonary nodules in the included lung bases, consistent with metastatic disease -01/24/2024: PET: Hypermetabolic right hepatic lobe masses, compatible with metastatic disease. The larger lesion has increased in size and metabolic activity compared to the prior PET scan. There is also a new smaller satellite right hepatic lobe lesion which is hypermetabolic. Scattered hypermetabolic pulmonary nodules, new compared to the prior chest CT but shown on the diagnostic CT from 01/11/2024. These  are compatible with pulmonary metastases. -02/04/2024: Liver lesion biopsy.  -Pathology: Adenocarcinoma consistent with recurrent metastatic colonic adenocarcinoma.  -NGS: KRAS G13D, MSI-stable, TMB-low, HER2 (0), ATM, SMAD4, APC  -Guardant 360: KRAS G13D, K-ras K117N, ATM [id number].  UG T1A1 *28/*28 for metabolizer.  -02/18/2024:Seen Dr. Luverne for a Y90.  Because of the size of the lesions was recommended systemic treatment and can be considered for Y90 or SBRT later down the line. -02/26/2024 to current: Xeloda  1,000 mg BID 2 weeks on/1 week off -03/06/2024 to current: Bevacizumab  every 3 weeks -03/20/2024: Germline mutation testing: Negative -04/10/2024: PET scan: Much improved appearance of the liver. The large hypermetabolic lesion in the right hepatic lobe is no longer hypermetabolic. No new hepatic lesions.Persistent bilateral pulmonary nodules consistent with metastatic disease. These are slightly smaller and no longer hypermetabolic. No new or progressive findings.   Diagnosis: Adenoid cystic carcinoma  -06/2015: Resection of left upper lip.  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 -08/11/2015: Adjuvant radiation therapy, 50 Gray, completed  Current Treatment:  Capecitabine  1000mg  BID + Bevacizumab   INTERVAL HISTORY:   Reise Gladney is here today for follow up. Patient is accompanied by his wife.    He experiences some back stiffness and pain but has no other complaints.  Overall doing well.  We discussed the recent PET scan findings that showed great response.  Patient will continue capecitabine  and bevacizumab  as prescribed.  He denies rash, diarrhea, nausea, vomiting, loss of appetite.   I have reviewed the past medical history, past surgical history, social history and family history with the patient and they are unchanged from previous note.  ALLERGIES:  is allergic to gadobenate.  MEDICATIONS:  Current Outpatient Medications  Medication Sig  Dispense Refill   amitriptyline  (ELAVIL ) 10 MG tablet Take 10 mg by mouth every evening.     amLODipine  (NORVASC ) 5 MG tablet Take 5 mg by mouth 2 (two) times daily.     ascorbic acid  (VITAMIN C /NATURAL ROSE HIPS) 1000 MG tablet Take 1,000 mg by mouth every evening.     atorvastatin  (LIPITOR) 40 MG tablet Take 40 mg by mouth every evening.     capecitabine  (XELODA ) 500 MG tablet Take 2 tablets (1,000 mg total) by mouth 2 (two) times daily after a meal. Take 2 weeks on/ 1 week off 56 tablet 1   carvedilol  (COREG ) 6.25 MG tablet Take 6.25 mg by mouth 2 (two) times daily with a meal. (Patient taking differently: Take 6.25 mg by mouth 2 (two) times daily with a meal. Takes half 2 times a day)     Cholecalciferol  (VITAMIN D3) 125 MCG (5000 UT) CAPS Take 5,000 Units by mouth every evening.     dorzolamide -timolol (COSOPT) 2-0.5 % ophthalmic solution Place 1 drop into both eyes 2 (two) times daily.     ELIQUIS  5 MG TABS tablet TAKE 1 TABLET(5 MG) BY MOUTH TWICE DAILY 60 tablet 3   ferrous sulfate  325 (65 FE) MG tablet Take 325 mg by mouth daily with breakfast.     folic acid  (FOLVITE ) 1 MG tablet Take 1 tablet (1 mg total) by mouth every evening. 30 tablet 0   hydrALAZINE  (APRESOLINE ) 25 MG tablet Take 50 mg by mouth in the morning and at bedtime.     KERENDIA  10 MG TABS Take 10 mg by mouth daily.     latanoprost  (XALATAN ) 0.005 % ophthalmic solution Place 1 drop into both eyes at bedtime.     linagliptin  (TRADJENTA ) 5 MG TABS tablet Take 5 mg by mouth in the morning.     loratadine  (CLARITIN ) 10 MG tablet Take 10 mg by mouth every morning.     magnesium  oxide (MAG-OX) 400 (240 Mg) MG tablet Take 1 tablet (400 mg total) by mouth daily. 90 tablet 0   Misc Natural Products (PROSTATE SUPPORT PO) Take 1 capsule by mouth every evening. NOW Prostate Support     omeprazole  (PRILOSEC) 40 MG capsule Take 1 capsule (40 mg total) by mouth daily. 90 capsule 3   ondansetron  (ZOFRAN ) 4 MG tablet Take 1 tablet (4 mg  total) by mouth every 6 (six) hours as needed for nausea. 20 tablet 0   ondansetron  (ZOFRAN -ODT) 4 MG disintegrating tablet 4mg  ODT q4 hours prn nausea/vomit 10 tablet 0   prochlorperazine  (COMPAZINE ) 10 MG tablet Take 1 tablet (10 mg total) by mouth every 6 (six) hours as needed for nausea or vomiting. 30 tablet 1   traMADol  (ULTRAM ) 50 MG tablet Take 1 tablet (50 mg total) by mouth at bedtime as needed. 30 tablet 0   vitamin B-12 (CYANOCOBALAMIN ) 1000 MCG tablet Take 1,000 mcg by mouth every evening. Now (Brand)     No current facility-administered medications for this visit.    REVIEW OF SYSTEMS:   Constitutional: Denies fevers, chills or abnormal weight loss Eyes: Denies blurriness of vision Ears, nose, mouth, throat, and face: Denies mucositis or sore throat Respiratory: Denies cough, dyspnea or wheezes Cardiovascular: Denies palpitation, chest discomfort or lower extremity swelling Gastrointestinal:  Denies nausea, heartburn or change in bowel habits Skin: Denies abnormal skin rashes Lymphatics: Denies new lymphadenopathy or easy bruising Neurological:Denies numbness, tingling or new  weaknesses Behavioral/Psych: Mood is stable, no new changes  All other systems were reviewed with the patient and are negative.   VITALS:  There were no vitals taken for this visit.  Wt Readings from Last 3 Encounters:  03/27/24 201 lb 12.8 oz (91.5 kg)  03/18/24 201 lb (91.2 kg)  02/26/24 194 lb 3.6 oz (88.1 kg)    There is no height or weight on file to calculate BMI.  Performance status (ECOG): 1 - Symptomatic but completely ambulatory  PHYSICAL EXAM:   GENERAL:alert, no distress and comfortable SKIN: skin color, texture, turgor are normal, no rashes or significant lesions LYMPH:  no palpable lymphadenopathy in the cervical, axillary or inguinal LUNGS: clear to auscultation and percussion with normal breathing effort HEART: regular rate & rhythm and no murmurs and no lower extremity  edema ABDOMEN:abdomen soft, non-tender and normal bowel sounds Musculoskeletal:no cyanosis of digits and no clubbing  NEURO: alert & oriented x 3 with fluent speech, no focal motor/sensory deficits  LABORATORY DATA:  I have reviewed the data as listed   Lab Results  Component Value Date   WBC 6.1 03/27/2024   NEUTROABS 4.1 03/27/2024   HGB 11.5 (L) 03/27/2024   HCT 33.7 (L) 03/27/2024   MCV 102.7 (H) 03/27/2024   PLT 147 (L) 03/27/2024      Chemistry      Component Value Date/Time   NA 138 03/27/2024 1226   K 4.3 03/27/2024 1226   CL 106 03/27/2024 1226   CO2 22 03/27/2024 1226   BUN 34 (H) 03/27/2024 1226   CREATININE 1.50 (H) 03/27/2024 1226      Component Value Date/Time   CALCIUM  8.9 03/27/2024 1226   ALKPHOS 123 03/27/2024 1226   AST 29 03/27/2024 1226   ALT 25 03/27/2024 1226   BILITOT 1.4 (H) 03/27/2024 1226        Latest Reference Range & Units 02/26/24 11:04  Iron 45 - 182 ug/dL 99  UIBC ug/dL 773  TIBC 749 - 549 ug/dL 674  Saturation Ratios 17.9 - 39.5 % 31  Ferritin 24 - 336 ng/mL 154  Folate >5.9 ng/mL 31.1   RADIOGRAPHIC STUDIES: I have personally reviewed the radiological images as listed and agreed with the findings in the report.  NM PET Image Restag (PS) Skull Base To Thigh CLINICAL DATA:  Subsequent treatment strategy for metastatic colon cancer.  EXAM: NUCLEAR MEDICINE PET SKULL BASE TO THIGH  TECHNIQUE: 9.91 mCi F-18 FDG was injected intravenously. Full-ring PET imaging was performed from the skull base to thigh after the radiotracer. CT data was obtained and used for attenuation correction and anatomic localization.  Fasting blood glucose: 153 mg/dl  COMPARISON:  Numerous prior imaging studies. The most recent PET-CT is 01/04/2024.  FINDINGS: Mediastinal blood pool activity: SUV max 2.3  Liver activity: SUV max NA  NECK: No hypermetabolic lymph nodes in the neck.  Incidental CT findings: Stable scattered carotid  artery calcifications.  CHEST: No hypermetabolic pulmonary lesions. No enlarged or hypermetabolic mediastinal or hilar lymph nodes. No supraclavicular axillary adenopathy.  Persistent bilateral scattered pulmonary nodules in both lungs consistent with metastatic disease. 10 mm right lower lobe nodule on image 78/202 previously measured 12 mm. 8 mm left lower lobe nodule on image 61/202 previously measured 8 mm. 11 mm right lower lobe nodule on image 74/2002 previously measured 13 mm. No new or progressive findings. These nodules demonstrated low level hypermetabolism on the prior study but are no longer hypermetabolic.  Incidental CT findings: Stable  age related vascular disease.  ABDOMEN/PELVIS: Much improved appearance of the liver. The large hypermetabolic lesion in the right hepatic lobe seen on the prior study is no longer hypermetabolic. There is a rim of mild FDG uptake around lesion with SUV max of 3.6 which could be post treatment related. Smaller peripheral hypermetabolic focus seen on the prior study has also resolved. No new hepatic lesions are identified.  No enlarged or hypermetabolic abdominopelvic lymph nodes. No hypermetabolic lesions in the pancreas spleen adrenal glands.  Incidental CT findings: Stable advanced vascular disease. Stable anterior abdominal hernia containing bowel loops. Stable right renal cyst and colonic diverticulosis.  SKELETON: No findings suspicious for osseous metastatic disease.  Incidental CT findings: None.  IMPRESSION: 1. Much improved appearance of the liver. The large hypermetabolic lesion in the right hepatic lobe is no longer hypermetabolic. There is a rim of mild FDG uptake around the lesion which could be post treatment related. Smaller peripheral hypermetabolic focus seen on the prior study has also resolved. No new hepatic lesions. 2. Persistent bilateral pulmonary nodules consistent with metastatic disease. These are  slightly smaller and no longer hypermetabolic. 3. No new or progressive findings.  Electronically Signed   By: MYRTIS Stammer M.D.   On: 04/15/2024 12:09

## 2024-04-15 NOTE — Assessment & Plan Note (Addendum)
 Currently metastatic colon cancer-metastatic to liver Oncology history as below Initially with stage II colorectal carcinoma s/p hemicolectomy and 1 hepatic lobe lesion s/p thermal ablation  Progression in the same spot repeat thermocoagulation.  New lung metastasis. Patient is currently on capecitabine  and bevacizumab .  Because of advanced age FOLFOX was not preferred Patient has seen Dr. Luverne for a Y90.  Because of the size of the lesions was recommended systemic treatment and can be considered for Y90 or SBRT later down the line. Currently on Capecitabine  and bevacizumab   - We reviewed the recent PET scan findings in detail.  Patient had significant improvement in the liver lesion in the lung nodules. Will repeat in 3 months - Labs reviewed today: Creatinine: 1.5(stable), CBC: Hemoglobin: 11.2(Stable), latest CEA pending from today. -CEA improved from prior.  Will repeat CEA with every cycle - Continue capecitabine  at 1000 mg twice daily and bevacizumab  every 3 weeks.  Reports no side effects from this. - Recommended using urea cream and diclofenac cream for hands and feet to avoid hand-foot syndrome  Return to clinic in 6 weeks with labs  for follow up

## 2024-04-15 NOTE — Assessment & Plan Note (Addendum)
 Patient has a history of CKD which is more or less stable at this time.  - Managed by patient's primary care

## 2024-04-15 NOTE — Assessment & Plan Note (Addendum)
 Patient has improved hemoglobin at this time Last iron labs within normal limits  - Will continue to monitor at this time every 3 months

## 2024-04-15 NOTE — Assessment & Plan Note (Addendum)
 Patient has a history of left leg DVT maybe around 2020.  Patient's wife thought it was likely after second COVID infection Patient also has a documented history of factor V Leiden deficiency.  I could not find this report.  -Considering current metastatic colon cancer, continue Eliquis  5mg  twice daily.

## 2024-04-16 ENCOUNTER — Inpatient Hospital Stay (HOSPITAL_BASED_OUTPATIENT_CLINIC_OR_DEPARTMENT_OTHER): Admitting: Oncology

## 2024-04-16 ENCOUNTER — Inpatient Hospital Stay

## 2024-04-16 VITALS — BP 165/60 | HR 47 | Temp 97.6°F | Resp 16 | Wt 201.2 lb

## 2024-04-16 DIAGNOSIS — N1831 Chronic kidney disease, stage 3a: Secondary | ICD-10-CM

## 2024-04-16 DIAGNOSIS — D509 Iron deficiency anemia, unspecified: Secondary | ICD-10-CM

## 2024-04-16 DIAGNOSIS — Z5112 Encounter for antineoplastic immunotherapy: Secondary | ICD-10-CM | POA: Diagnosis present

## 2024-04-16 DIAGNOSIS — C787 Secondary malignant neoplasm of liver and intrahepatic bile duct: Secondary | ICD-10-CM | POA: Diagnosis not present

## 2024-04-16 DIAGNOSIS — C187 Malignant neoplasm of sigmoid colon: Secondary | ICD-10-CM

## 2024-04-16 DIAGNOSIS — D6851 Activated protein C resistance: Secondary | ICD-10-CM | POA: Diagnosis not present

## 2024-04-16 DIAGNOSIS — Z86718 Personal history of other venous thrombosis and embolism: Secondary | ICD-10-CM | POA: Diagnosis not present

## 2024-04-16 DIAGNOSIS — Z7901 Long term (current) use of anticoagulants: Secondary | ICD-10-CM | POA: Diagnosis not present

## 2024-04-16 DIAGNOSIS — C7800 Secondary malignant neoplasm of unspecified lung: Secondary | ICD-10-CM | POA: Diagnosis not present

## 2024-04-16 LAB — CBC WITH DIFFERENTIAL/PLATELET
Abs Immature Granulocytes: 0.02 K/uL (ref 0.00–0.07)
Basophils Absolute: 0.1 K/uL (ref 0.0–0.1)
Basophils Relative: 1 %
Eosinophils Absolute: 0.3 K/uL (ref 0.0–0.5)
Eosinophils Relative: 4 %
HCT: 33.1 % — ABNORMAL LOW (ref 39.0–52.0)
Hemoglobin: 11.2 g/dL — ABNORMAL LOW (ref 13.0–17.0)
Immature Granulocytes: 0 %
Lymphocytes Relative: 13 %
Lymphs Abs: 0.9 K/uL (ref 0.7–4.0)
MCH: 35.4 pg — ABNORMAL HIGH (ref 26.0–34.0)
MCHC: 33.8 g/dL (ref 30.0–36.0)
MCV: 104.7 fL — ABNORMAL HIGH (ref 80.0–100.0)
Monocytes Absolute: 0.8 K/uL (ref 0.1–1.0)
Monocytes Relative: 13 %
Neutro Abs: 4.6 K/uL (ref 1.7–7.7)
Neutrophils Relative %: 69 %
Platelets: 147 K/uL — ABNORMAL LOW (ref 150–400)
RBC: 3.16 MIL/uL — ABNORMAL LOW (ref 4.22–5.81)
RDW: 17 % — ABNORMAL HIGH (ref 11.5–15.5)
WBC: 6.6 K/uL (ref 4.0–10.5)
nRBC: 0 % (ref 0.0–0.2)

## 2024-04-16 LAB — COMPREHENSIVE METABOLIC PANEL WITH GFR
ALT: 32 U/L (ref 0–44)
AST: 39 U/L (ref 15–41)
Albumin: 3.5 g/dL (ref 3.5–5.0)
Alkaline Phosphatase: 129 U/L — ABNORMAL HIGH (ref 38–126)
Anion gap: 11 (ref 5–15)
BUN: 40 mg/dL — ABNORMAL HIGH (ref 8–23)
CO2: 22 mmol/L (ref 22–32)
Calcium: 8.7 mg/dL — ABNORMAL LOW (ref 8.9–10.3)
Chloride: 106 mmol/L (ref 98–111)
Creatinine, Ser: 1.55 mg/dL — ABNORMAL HIGH (ref 0.61–1.24)
GFR, Estimated: 43 mL/min — ABNORMAL LOW
Glucose, Bld: 120 mg/dL — ABNORMAL HIGH (ref 70–99)
Potassium: 4.4 mmol/L (ref 3.5–5.1)
Sodium: 139 mmol/L (ref 135–145)
Total Bilirubin: 1.3 mg/dL — ABNORMAL HIGH (ref 0.0–1.2)
Total Protein: 6.4 g/dL — ABNORMAL LOW (ref 6.5–8.1)

## 2024-04-16 LAB — URINALYSIS, DIPSTICK ONLY
Bilirubin Urine: NEGATIVE
Glucose, UA: NEGATIVE mg/dL
Hgb urine dipstick: NEGATIVE
Ketones, ur: NEGATIVE mg/dL
Nitrite: NEGATIVE
Protein, ur: 100 mg/dL — AB
Specific Gravity, Urine: 1.006 (ref 1.005–1.030)
pH: 6 (ref 5.0–8.0)

## 2024-04-16 LAB — MAGNESIUM: Magnesium: 1.8 mg/dL (ref 1.7–2.4)

## 2024-04-16 NOTE — Progress Notes (Signed)
 Patient is taking Xeloda  as prescribed. He has not missed any doses and reports no side effects at this time.    Patient has been examined by Dr. Davonna. Vital signs and labs have been reviewed by MD - ANC, Creatinine (1.55), LFTs, hemoglobin, and platelets have been reviewed by M.D. - pt may proceed with treatment tomorrow. Primary RN and pharmacy notified.

## 2024-04-16 NOTE — Patient Instructions (Signed)
 [facility] at [facility] Discharge Instructions   You were seen and examined today by Dr. Davonna.  She reviewed the results of your lab work which are normal/stable.   She reviewed the results of your PET scan which shows an excellent response to treatment.   We will proceed with your treatment tomorrow.  Continue Xeloda  as prescribed.    Return as scheduled.    Thank you for choosing [facility] at [facility] to provide your oncology and hematology care.  To afford each patient quality time with our provider, please arrive at least 15 minutes before your scheduled appointment time.   If you have a lab appointment with the Cancer Center please come in thru the Main Entrance and check in at the main information desk.  You need to re-schedule your appointment should you arrive 10 or more minutes late.  We strive to give you quality time with our providers, and arriving late affects you and other patients whose appointments are after yours.  Also, if you no show three or more times for appointments you may be dismissed from the clinic at the providers discretion.     Again, thank you for choosing [facility].  Our hope is that these requests will decrease the amount of time that you wait before being seen by our physicians.       _____________________________________________________________  Should you have questions after your visit to [facility], please contact our office at [phone] and follow the prompts.  Our office hours are 8:00 a.m. and 4:30 p.m. Monday - Friday.  Please note that voicemails left after 4:00 p.m. may not be returned until the following business day.  We are closed weekends and major holidays.  You do have access to a nurse 24-7, just call the main number to the clinic [phone] and do not press any options, hold on the line and a nurse will answer the phone.    For prescription refill  requests, have your pharmacy contact our office and allow 72 hours.    Due to Covid, you will need to wear a mask upon entering the hospital. If you do not have a mask, a mask will be given to you at the Main Entrance upon arrival. For doctor visits, patients may have 1 support person age 37 or older with them. For treatment visits, patients can not have anyone with them due to social distancing guidelines and our immunocompromised population.

## 2024-04-17 ENCOUNTER — Other Ambulatory Visit

## 2024-04-17 ENCOUNTER — Inpatient Hospital Stay

## 2024-04-17 ENCOUNTER — Other Ambulatory Visit: Payer: Self-pay

## 2024-04-17 VITALS — BP 158/64 | HR 54 | Temp 97.0°F | Resp 18 | Wt 200.8 lb

## 2024-04-17 DIAGNOSIS — C187 Malignant neoplasm of sigmoid colon: Secondary | ICD-10-CM

## 2024-04-17 DIAGNOSIS — C189 Malignant neoplasm of colon, unspecified: Secondary | ICD-10-CM

## 2024-04-17 DIAGNOSIS — Z5112 Encounter for antineoplastic immunotherapy: Secondary | ICD-10-CM | POA: Diagnosis not present

## 2024-04-17 LAB — CEA: CEA: 35.8 ng/mL — ABNORMAL HIGH (ref 0.0–4.7)

## 2024-04-17 MED ORDER — SODIUM CHLORIDE 0.9 % IV SOLN: INTRAVENOUS | Status: DC

## 2024-04-17 MED ORDER — SODIUM CHLORIDE 0.9 % IV SOLN
7.5000 mg/kg | Freq: Once | INTRAVENOUS | Status: AC
  Administered 2024-04-17: 700 mg via INTRAVENOUS
  Filled 2024-04-17: qty 16

## 2024-04-17 NOTE — Progress Notes (Signed)
 Ok to treat. MD repeating 24 hour UP.  Bridgett Leach [place], [place], BCPS, BCOP 04/17/2024 12:32 PM

## 2024-04-17 NOTE — Patient Instructions (Signed)
 [facility] [place] - A DEPT [facility]  Discharge Instructions: Thank you for choosing [facility] to provide your oncology and hematology care.  If you have a lab appointment with the Cancer Center - please note that after April 8th, 2024, all labs will be drawn in the cancer center.  You do not have to check in or register with the main entrance as you have in the past but will complete your check-in in the cancer center.  Wear comfortable clothing and clothing appropriate for easy access to any Portacath or PICC line.   We strive to give you quality time with your provider. You may need to reschedule your appointment if you arrive late (15 or more minutes).  Arriving late affects you and other patients whose appointments are after yours.  Also, if you miss three or more appointments without notifying the office, you may be dismissed from the clinic at the provider's discretion.      For prescription refill requests, have your pharmacy contact our office and allow 72 hours for refills to be completed.    Today you received the following chemotherapy and/or immunotherapy agents avastin   To help prevent nausea and vomiting after your treatment, we encourage you to take your nausea medication as directed.  BELOW ARE SYMPTOMS THAT SHOULD BE REPORTED IMMEDIATELY: *FEVER GREATER THAN 100.4 F (38 C) OR HIGHER *CHILLS OR SWEATING *NAUSEA AND VOMITING THAT IS NOT CONTROLLED WITH YOUR NAUSEA MEDICATION *UNUSUAL SHORTNESS OF BREATH *UNUSUAL BRUISING OR BLEEDING *URINARY PROBLEMS (pain or burning when urinating, or frequent urination) *BOWEL PROBLEMS (unusual diarrhea, constipation, pain near the anus) TENDERNESS IN MOUTH AND THROAT WITH OR WITHOUT PRESENCE OF ULCERS (sore throat, sores in mouth, or a toothache) UNUSUAL RASH, SWELLING OR PAIN  UNUSUAL VAGINAL DISCHARGE OR ITCHING   Items with * indicate a potential emergency and should be followed up as  soon as possible or go to the Emergency Department if any problems should occur.  Please show the CHEMOTHERAPY ALERT CARD or IMMUNOTHERAPY ALERT CARD at check-in to the Emergency Department and triage nurse.  Should you have questions after your visit or need to cancel or reschedule your appointment, please contact [facility] [place] - A DEPT OF [facility] [phone]  and follow the prompts.  Office hours are 8:00 a.m. to 4:30 p.m. Monday - Friday. Please note that voicemails left after 4:00 p.m. may not be returned until the following business day.  We are closed weekends and major holidays. You have access to a nurse at all times for urgent questions. Please call the main number to the clinic [phone] and follow the prompts.  For any non-urgent questions, you may also contact your provider using MyChart. We now offer e-Visits for anyone 66 and older to request care online for non-urgent symptoms. For details visit [email or web address].   Also download the MyChart app! Go to the app store, search "MyChart", open the app, select [place], and log in with your MyChart username and password.

## 2024-04-17 NOTE — Progress Notes (Signed)
 Reviewed urine protein and vital signs with oncologist with verbal order ok to treat.   Patient given 24 hour urine drug and reviewed directions.    Patient tolerated therapy with no complaints voiced.  Side effects with management reviewed with understanding verbalized.  Peripheral IV site clean and dry with no bruising or swelling noted at site.  Good blood return noted before and after administration of therapy.  Band aid applied.  Patient left in satisfactory condition with VSS and no s/s of distress noted.

## 2024-04-18 ENCOUNTER — Other Ambulatory Visit: Payer: Self-pay

## 2024-04-23 ENCOUNTER — Other Ambulatory Visit: Payer: Self-pay

## 2024-04-24 ENCOUNTER — Ambulatory Visit: Payer: Self-pay | Admitting: *Deleted

## 2024-04-24 ENCOUNTER — Other Ambulatory Visit: Payer: Self-pay

## 2024-04-24 ENCOUNTER — Other Ambulatory Visit (HOSPITAL_COMMUNITY): Payer: Self-pay

## 2024-04-24 ENCOUNTER — Encounter (INDEPENDENT_AMBULATORY_CARE_PROVIDER_SITE_OTHER): Payer: Self-pay

## 2024-04-24 DIAGNOSIS — C187 Malignant neoplasm of sigmoid colon: Secondary | ICD-10-CM

## 2024-04-24 DIAGNOSIS — C787 Secondary malignant neoplasm of liver and intrahepatic bile duct: Secondary | ICD-10-CM

## 2024-04-24 DIAGNOSIS — Z5112 Encounter for antineoplastic immunotherapy: Secondary | ICD-10-CM | POA: Diagnosis not present

## 2024-04-24 LAB — PROTEIN, URINE, 24 HOUR
Collection Interval-UPROT: 24 h
Protein, 24H Urine: 3874 mg/d — ABNORMAL HIGH (ref 50–100)
Protein, Urine: 149 mg/dL
Urine Total Volume-UPROT: 2600 mL

## 2024-04-24 NOTE — Progress Notes (Signed)
 Niels made aware of requested recommendations.

## 2024-04-25 ENCOUNTER — Other Ambulatory Visit: Payer: Self-pay

## 2024-04-25 NOTE — Progress Notes (Signed)
 Specialty Pharmacy Refill Coordination Note  Herbert Wood is a 89 y.o. male contacted today regarding refills of specialty medication(s) Capecitabine  (XELODA )   Patient requested (Patient-Rptd) Delivery   Delivery date: 04/30/24   Verified address: (Patient-Rptd) [address] [place], [place]  [address]   Medication will be filled on 04/29/24.

## 2024-04-27 ENCOUNTER — Other Ambulatory Visit: Payer: Self-pay

## 2024-04-29 ENCOUNTER — Other Ambulatory Visit (HOSPITAL_COMMUNITY): Payer: Self-pay

## 2024-04-29 ENCOUNTER — Other Ambulatory Visit: Payer: Self-pay

## 2024-05-07 NOTE — Progress Notes (Signed)
 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  New lung metastasis. Patient is currently on capecitabine  and bevacizumab .  Because of advanced age FOLFOX was not preferred Patient has seen Dr. Luverne for a Y90.  Because of the size of the lesions was recommended systemic treatment and can be considered for Y90 or SBRT later down the line.   - Labs reviewed today: Creatinine: 1.5(stable), CBC: Hemoglobin: 11.6(Stable), CEA:22.5 -CEA improved from prior.  Will repeat CEA with every cycle - 24-hour urine protein with 3874 mg of protein.  Will hold bevacizumab  at this time. -Continue capecitabine  at 1000 mg twice daily to be taken 2 weeks on 1 week off.   Reports no side effects from this. -Can consider restarting Avastin  after repeating a 24-hour urine at progression - Recent PET scan showed significant improvement in the liver lesion in the lung nodules. 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 Held to prevent kidney damage.  - Hold Avastin  therapy. - Reassess urine protein levels at  progression and consider restarting. - Will refer to nephrology for further assessment and rule out nephrotic syndrome  Chronic kidney disease Potentially exacerbated by Avastin . Referred to nephrology for nephrotic syndrome evaluation.  - Refer to nephrology for evaluation of nephrotic syndrome. - Send records to Dr. Ida Sprung at [facility].  History of DVT Patient has a history of left leg DVT maybe around 2020.  Patient's wife thought it was likely after second COVID infection Patient also has a documented history of factor V Leiden deficiency.  I could not find this report.   -Considering current metastatic colon cancer, continue Eliquis  5mg  twice daily.  Iron deficiency anemia Patient has improved hemoglobin at this time Last iron labs within normal limits   - Will continue to monitor at this time every 3 months  Chronic nasal drainage Claritin  ineffective.  -Will do a trial of Flonase for nasal drainage. - Continue Claritin .  General Health Maintenance Discussed flu and COVID booster vaccines. Timing not critical. - Receive flu and COVID booster vaccines.    The patient understands the plans discussed today and is in agreement with them.  He knows to contact our office if he develops concerns prior to his next appointment.  40 minutes of total time was spent for this patient encounter, including preparation, face-to-face counseling with the patient and coordination of care, physical exam, and documentation of the encounter.    [name], MD  [facility] [place] - A DEPT OF [facility] [address] [place]  [address] Dept: [phone] Dept Fax: [phone]   No orders of the defined types were placed in this encounter.    ONCOLOGY HISTORY:   I have reviewed his chart and materials related to his cancer extensively and collaborated history with the patient. 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 -01/10/2022: Colonoscopy: A fungating, ulcerated, partially obstructing mass found at 42 cm proximal to the anus. Two 2-6 mm polyps in the transverse colon.  Pathology: Invasive moderately differentiated adenocarcinoma. -01/13/2022: CT CAP: No evidence of metastatic disease in the chest, abdomen, or pelvis. -02/17/2022: Left hemicolectomy.  Pathology: Moderately differentiated invasive adenocarcinoma in the descending colon, 5.8 cm, with clear margins of resection and negative mesenteric lymph nodes.  MMR preserved. pT3 pN0.  -07/13/2022: CT CAP:  Interval partial colectomy without evidence of local recurrence. No convincing evidence of metastatic disease within the chest, abdomen, or pelvis. -10/19/2022: CEA: 20.9 -11/09/2022: PET: Single hypermetabolic metastasis inferiorly in the right hepatic lobe. No other evidence of metastatic disease. Enlarging subcutaneous nodule posteriorly in the lower left chest with low level metabolic activity. -11/22/2022: MRI Liver: Isolated inferior right hepatic lobe 2.8 cm metastasis.  -12/27/2022: Thermal ablation of right lobe liver lesion -09/18/2023: MRI abdomen: Choledocholithiasis. Known metastasis of the inferior right lobe of the liver, hepatic segment VI. -10/08/2023: CEA: 94.8 -10/10/2023: Percutaneous microwave thermal ablation of right lobe liver mass -01/11/2024: CEA: 36.9 -01/11/2024: CT CAP: Numerous new and enlarged bilateral pulmonary nodules, consistent with worsened pulmonary metastatic disease. Expected appearance of ablation site of hepatic segment VI with overlying capsular retraction. No new liver lesions appreciated by CT. -01/11/2024: MRI liver: Interval ablation of a metastasis of hepatic segment VI. Residual or  recurrent rim enhancing tumor at the superior margin of the ablation site measuring 4.1 x 3.6 x 1.8 cm. Pulmonary nodules in the included lung bases, consistent with metastatic disease -01/24/2024: PET: Hypermetabolic right hepatic lobe masses, compatible with metastatic disease. The larger lesion has increased in size and metabolic activity compared to the prior PET scan. There is also a new smaller satellite right hepatic lobe lesion which is hypermetabolic. Scattered hypermetabolic pulmonary nodules, new compared to the prior chest CT but shown on the diagnostic CT from 01/11/2024. These are compatible with pulmonary metastases. -02/04/2024: Liver lesion biopsy.  -Pathology: Adenocarcinoma consistent with recurrent metastatic colonic adenocarcinoma.  -NGS: KRAS G13D, MSI-stable, TMB-low, HER2 (0), ATM, SMAD4, APC  -Guardant 360: KRAS G13D, K-ras K117N, ATM [id number].  UG T1A1 *28/*28 for metabolizer.  -02/18/2024:Seen Dr. Luverne for a Y90.  Because of the size of the lesions was recommended systemic treatment and can be considered for Y90 or SBRT later down the line. -02/26/2024 to current: Xeloda  1,000 mg BID 2 weeks on/1 week off -03/06/2024 to 04/17/2024: Bevacizumab  every 3 weeks.  Held on 05/09/2024 for 24-hour urine protein of 3875mg . -03/20/2024: Germline mutation testing: Negative -04/10/2024: PET scan: Much improved appearance of the liver. The large hypermetabolic lesion in the right hepatic lobe is no longer hypermetabolic. No new hepatic lesions.Persistent bilateral pulmonary nodules consistent with metastatic disease. These are slightly smaller and no longer hypermetabolic. No new or progressive findings.   Diagnosis: Adenoid cystic carcinoma  -06/2015: Resection of left upper lip.  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 -08/11/2015: Adjuvant radiation therapy, 50 Gray, completed  Current Treatment:  Capecitabine  1000mg   BID   INTERVAL HISTORY:   Discussed  the use of AI scribe software for clinical note transcription with the patient, who gave verbal consent to proceed.  History of Present Illness Herbert Wood is an 89 year old male with metastatic colon cancer who presents for follow-up and chemotherapy.  He is currently on Xeloda  for cancer treatment, which he restarted a new cycle today. Previously, he was also receiving Avastin , but it was held due to increased urine protein levels, with a 24-hour urine protein exceeding 2 grams.  Reports no complaints from Xeloda .  He has a long-standing issue with nasal drip, which has worsened recently. He takes two Claritin  daily without significant improvement. He had surgery for a deviated septum approximately 60 years ago, and his nose has dripped since then. He has not tried [employer].     I have reviewed the past medical history, past surgical history, social history and family history with the patient and they are unchanged from previous note.  ALLERGIES:  is allergic to gadobenate.  MEDICATIONS:  Current Outpatient Medications  Medication Sig Dispense Refill   amitriptyline  (ELAVIL ) 10 MG tablet Take 10 mg by mouth every evening.     amLODipine  (NORVASC ) 5 MG tablet Take 5 mg by mouth 2 (two) times daily.     ascorbic acid  (VITAMIN C /NATURAL ROSE HIPS) 1000 MG tablet Take 1,000 mg by mouth every evening.     atorvastatin  (LIPITOR) 40 MG tablet Take 40 mg by mouth every evening.     capecitabine  (XELODA ) 500 MG tablet Take 2 tablets (1,000 mg total) by mouth 2 (two) times daily after a meal. Take 2 weeks on/ 1 week off 56 tablet 1   carvedilol  (COREG ) 6.25 MG tablet Take 6.25 mg by mouth 2 (two) times daily with a meal. (Patient taking differently: Take 6.25 mg by mouth 2 (two) times daily with a meal. Takes half 2 times a day)     Cholecalciferol  (VITAMIN D3) 125 MCG (5000 UT) CAPS Take 5,000 Units by mouth every evening.     dorzolamide -timolol (COSOPT) 2-0.5 % ophthalmic solution Place 1 drop  into both eyes 2 (two) times daily.     ELIQUIS  5 MG TABS tablet TAKE 1 TABLET(5 MG) BY MOUTH TWICE DAILY 60 tablet 3   ferrous sulfate  325 (65 FE) MG tablet Take 325 mg by mouth daily with breakfast.     folic acid  (FOLVITE ) 1 MG tablet Take 1 tablet (1 mg total) by mouth every evening. 30 tablet 0   hydrALAZINE  (APRESOLINE ) 25 MG tablet Take 50 mg by mouth in the morning and at bedtime.     KERENDIA  10 MG TABS Take 10 mg by mouth daily.     latanoprost  (XALATAN ) 0.005 % ophthalmic solution Place 1 drop into both eyes at bedtime.     linagliptin  (TRADJENTA ) 5 MG TABS tablet Take 5 mg by mouth in the morning.     loratadine  (CLARITIN ) 10 MG tablet Take 10 mg by mouth every morning.     magnesium  oxide (MAG-OX) 400 (240 Mg) MG tablet Take 1 tablet (400 mg total) by mouth daily. 90 tablet 0   Misc Natural Products (PROSTATE SUPPORT PO) Take 1 capsule by mouth every evening. NOW Prostate Support     omeprazole  (PRILOSEC) 40 MG capsule Take 1 capsule (40 mg total) by mouth daily. 90 capsule 3   ondansetron  (ZOFRAN ) 4 MG tablet Take 1 tablet (4 mg total) by mouth every 6 (six) hours as needed for nausea. 20 tablet  0   ondansetron  (ZOFRAN -ODT) 4 MG disintegrating tablet 4mg  ODT q4 hours prn nausea/vomit 10 tablet 0   prochlorperazine  (COMPAZINE ) 10 MG tablet Take 1 tablet (10 mg total) by mouth every 6 (six) hours as needed for nausea or vomiting. 30 tablet 1   traMADol  (ULTRAM ) 50 MG tablet Take 1 tablet (50 mg total) by mouth at bedtime as needed. 30 tablet 0   vitamin B-12 (CYANOCOBALAMIN ) 1000 MCG tablet Take 1,000 mcg by mouth every evening. Now (Brand)     No current facility-administered medications for this visit.    REVIEW OF SYSTEMS:   Constitutional: Denies fevers, chills or abnormal weight loss Eyes: Denies blurriness of vision Ears, nose, mouth, throat, and face: Denies mucositis or sore throat Respiratory: Denies cough, dyspnea or wheezes Cardiovascular: Denies palpitation, chest  discomfort or lower extremity swelling Gastrointestinal:  Denies nausea, heartburn or change in bowel habits Skin: Denies abnormal skin rashes Lymphatics: Denies new lymphadenopathy or easy bruising Neurological:Denies numbness, tingling or new weaknesses Behavioral/Psych: Mood is stable, no new changes  All other systems were reviewed with the patient and are negative.   VITALS:  There were no vitals taken for this visit.  Wt Readings from Last 3 Encounters:  04/17/24 200 lb 12.8 oz (91.1 kg)  04/16/24 201 lb 3.2 oz (91.3 kg)  03/27/24 201 lb 12.8 oz (91.5 kg)    There is no height or weight on file to calculate BMI.  Performance status (ECOG): 1 - Symptomatic but completely ambulatory  PHYSICAL EXAM:   GENERAL:alert, no distress and comfortable SKIN: skin color, texture, turgor are normal, no rashes or significant lesions LYMPH:  no palpable lymphadenopathy in the cervical, axillary or inguinal LUNGS: clear to auscultation and percussion with normal breathing effort HEART: regular rate & rhythm and no murmurs and no lower extremity edema ABDOMEN:abdomen soft, non-tender and normal bowel sounds Musculoskeletal:no cyanosis of digits and no clubbing  NEURO: alert & oriented x 3 with fluent speech, no focal motor/sensory deficits  LABORATORY DATA:  I have reviewed the data as listed   Lab Results  Component Value Date   WBC 6.6 04/16/2024   NEUTROABS 4.6 04/16/2024   HGB 11.2 (L) 04/16/2024   HCT 33.1 (L) 04/16/2024   MCV 104.7 (H) 04/16/2024   PLT 147 (L) 04/16/2024      Chemistry      Component Value Date/Time   NA 139 04/16/2024 1009   K 4.4 04/16/2024 1009   CL 106 04/16/2024 1009   CO2 22 04/16/2024 1009   BUN 40 (H) 04/16/2024 1009   CREATININE 1.55 (H) 04/16/2024 1009      Component Value Date/Time   CALCIUM  8.7 (L) 04/16/2024 1009   ALKPHOS 129 (H) 04/16/2024 1009   AST 39 04/16/2024 1009   ALT 32 04/16/2024 1009   BILITOT 1.3 (H) 04/16/2024 1009        Latest Reference Range & Units 05/08/24 12:56  Appearance CLEAR  CLEAR  Bilirubin Urine NEGATIVE  NEGATIVE  Color, Urine YELLOW  YELLOW  Glucose, UA NEGATIVE mg/dL NEGATIVE  Hgb urine dipstick NEGATIVE  NEGATIVE  Ketones, ur NEGATIVE mg/dL NEGATIVE  Leukocytes,Ua NEGATIVE  NEGATIVE  Nitrite NEGATIVE  NEGATIVE  pH 5.0 - 8.0  5.0  Protein NEGATIVE mg/dL >=699 !  Specific Gravity, Urine 1.005 - 1.030  1.012  !: Data is abnormal   Latest Reference Range & Units 04/24/24 10:42  Urine Total Volume-UPROT mL 2,600  Collection Interval-UPROT hours 24  Protein, Urine mg/dL 850  Protein, 24H Urine 50 - 100 mg/day 3,874 (H)  (H): Data is abnormally high   Latest Reference Range & Units 05/08/24 12:56  CEA 0.0 - 4.7 ng/mL 22.5 (H)  (H): Data is abnormally high   Latest Reference Range & Units 05/08/24 12:55  Iron 45 - 182 ug/dL 793 (H)  UIBC ug/dL 869  TIBC 749 - 549 ug/dL 663  Saturation Ratios 17.9 - 39.5 % 61 (H)  Ferritin 24 - 336 ng/mL 318  Folate >5.9 ng/mL 15.8  Vitamin B12 180 - 914 pg/mL 1,863 (H)  (H): Data is abnormally high  RADIOGRAPHIC STUDIES: I have personally reviewed the radiological images as listed and agreed with the findings in the report.  NM PET Image Restag (PS) Skull Base To Thigh CLINICAL DATA:  Subsequent treatment strategy for metastatic colon cancer.  EXAM: NUCLEAR MEDICINE PET SKULL BASE TO THIGH  TECHNIQUE: 9.91 mCi F-18 FDG was injected intravenously. Full-ring PET imaging was performed from the skull base to thigh after the radiotracer. CT data was obtained and used for attenuation correction and anatomic localization.  Fasting blood glucose: 153 mg/dl  COMPARISON:  Numerous prior imaging studies. The most recent PET-CT is 01/04/2024.  FINDINGS: Mediastinal blood pool activity: SUV max 2.3  Liver activity: SUV max NA  NECK: No hypermetabolic lymph nodes in the neck.  Incidental CT findings: Stable scattered carotid  artery calcifications.  CHEST: No hypermetabolic pulmonary lesions. No enlarged or hypermetabolic mediastinal or hilar lymph nodes. No supraclavicular axillary adenopathy.  Persistent bilateral scattered pulmonary nodules in both lungs consistent with metastatic disease. 10 mm right lower lobe nodule on image 78/202 previously measured 12 mm. 8 mm left lower lobe nodule on image 61/202 previously measured 8 mm. 11 mm right lower lobe nodule on image 74/2002 previously measured 13 mm. No new or progressive findings. These nodules demonstrated low level hypermetabolism on the prior study but are no longer hypermetabolic.  Incidental CT findings: Stable age related vascular disease.  ABDOMEN/PELVIS: Much improved appearance of the liver. The large hypermetabolic lesion in the right hepatic lobe seen on the prior study is no longer hypermetabolic. There is a rim of mild FDG uptake around lesion with SUV max of 3.6 which could be post treatment related. Smaller peripheral hypermetabolic focus seen on the prior study has also resolved. No new hepatic lesions are identified.  No enlarged or hypermetabolic abdominopelvic lymph nodes. No hypermetabolic lesions in the pancreas spleen adrenal glands.  Incidental CT findings: Stable advanced vascular disease. Stable anterior abdominal hernia containing bowel loops. Stable right renal cyst and colonic diverticulosis.  SKELETON: No findings suspicious for osseous metastatic disease.  Incidental CT findings: None.  IMPRESSION: 1. Much improved appearance of the liver. The large hypermetabolic lesion in the right hepatic lobe is no longer hypermetabolic. There is a rim of mild FDG uptake around the lesion which could be post treatment related. Smaller peripheral hypermetabolic focus seen on the prior study has also resolved. No new hepatic lesions. 2. Persistent bilateral pulmonary nodules consistent with metastatic disease. These are  slightly smaller and no longer hypermetabolic. 3. No new or progressive findings.  Electronically Signed   By: MYRTIS Stammer M.D.   On: 04/15/2024 12:09     "

## 2024-05-08 ENCOUNTER — Inpatient Hospital Stay: Attending: Hematology

## 2024-05-08 ENCOUNTER — Inpatient Hospital Stay

## 2024-05-08 ENCOUNTER — Other Ambulatory Visit

## 2024-05-08 ENCOUNTER — Inpatient Hospital Stay (HOSPITAL_BASED_OUTPATIENT_CLINIC_OR_DEPARTMENT_OTHER): Admitting: Oncology

## 2024-05-08 ENCOUNTER — Inpatient Hospital Stay: Admitting: Oncology

## 2024-05-08 VITALS — BP 134/68 | HR 53 | Temp 97.7°F | Resp 19 | Wt 195.0 lb

## 2024-05-08 DIAGNOSIS — D509 Iron deficiency anemia, unspecified: Secondary | ICD-10-CM | POA: Insufficient documentation

## 2024-05-08 DIAGNOSIS — C187 Malignant neoplasm of sigmoid colon: Secondary | ICD-10-CM | POA: Insufficient documentation

## 2024-05-08 DIAGNOSIS — Z86718 Personal history of other venous thrombosis and embolism: Secondary | ICD-10-CM

## 2024-05-08 DIAGNOSIS — N189 Chronic kidney disease, unspecified: Secondary | ICD-10-CM | POA: Insufficient documentation

## 2024-05-08 DIAGNOSIS — N049 Nephrotic syndrome with unspecified morphologic changes: Secondary | ICD-10-CM | POA: Insufficient documentation

## 2024-05-08 DIAGNOSIS — C787 Secondary malignant neoplasm of liver and intrahepatic bile duct: Secondary | ICD-10-CM | POA: Diagnosis not present

## 2024-05-08 DIAGNOSIS — N1831 Chronic kidney disease, stage 3a: Secondary | ICD-10-CM | POA: Diagnosis not present

## 2024-05-08 DIAGNOSIS — C189 Malignant neoplasm of colon, unspecified: Secondary | ICD-10-CM

## 2024-05-08 DIAGNOSIS — D631 Anemia in chronic kidney disease: Secondary | ICD-10-CM

## 2024-05-08 DIAGNOSIS — D6851 Activated protein C resistance: Secondary | ICD-10-CM | POA: Diagnosis not present

## 2024-05-08 DIAGNOSIS — C7800 Secondary malignant neoplasm of unspecified lung: Secondary | ICD-10-CM | POA: Insufficient documentation

## 2024-05-08 LAB — URINALYSIS, DIPSTICK ONLY
Bilirubin Urine: NEGATIVE
Glucose, UA: NEGATIVE mg/dL
Hgb urine dipstick: NEGATIVE
Ketones, ur: NEGATIVE mg/dL
Leukocytes,Ua: NEGATIVE
Nitrite: NEGATIVE
Protein, ur: >=300 mg/dL — AB
Specific Gravity, Urine: 1.012 (ref 1.005–1.030)
pH: 5 (ref 5.0–8.0)

## 2024-05-08 LAB — CBC WITH DIFFERENTIAL/PLATELET
Abs Immature Granulocytes: 0.01 K/uL (ref 0.00–0.07)
Basophils Absolute: 0.1 K/uL (ref 0.0–0.1)
Basophils Relative: 1 %
Eosinophils Absolute: 0.3 K/uL (ref 0.0–0.5)
Eosinophils Relative: 5 %
HCT: 34.6 % — ABNORMAL LOW (ref 39.0–52.0)
Hemoglobin: 11.6 g/dL — ABNORMAL LOW (ref 13.0–17.0)
Immature Granulocytes: 0 %
Lymphocytes Relative: 17 %
Lymphs Abs: 1 K/uL (ref 0.7–4.0)
MCH: 36 pg — ABNORMAL HIGH (ref 26.0–34.0)
MCHC: 33.5 g/dL (ref 30.0–36.0)
MCV: 107.5 fL — ABNORMAL HIGH (ref 80.0–100.0)
Monocytes Absolute: 0.8 K/uL (ref 0.1–1.0)
Monocytes Relative: 14 %
Neutro Abs: 3.9 K/uL (ref 1.7–7.7)
Neutrophils Relative %: 63 %
Platelets: 132 K/uL — ABNORMAL LOW (ref 150–400)
RBC: 3.22 MIL/uL — ABNORMAL LOW (ref 4.22–5.81)
RDW: 17.8 % — ABNORMAL HIGH (ref 11.5–15.5)
WBC: 6.1 K/uL (ref 4.0–10.5)
nRBC: 0 % (ref 0.0–0.2)

## 2024-05-08 LAB — COMPREHENSIVE METABOLIC PANEL WITH GFR
ALT: 36 U/L (ref 0–44)
AST: 41 U/L (ref 15–41)
Albumin: 3.9 g/dL (ref 3.5–5.0)
Alkaline Phosphatase: 152 U/L — ABNORMAL HIGH (ref 38–126)
Anion gap: 12 (ref 5–15)
BUN: 34 mg/dL — ABNORMAL HIGH (ref 8–23)
CO2: 23 mmol/L (ref 22–32)
Calcium: 9.3 mg/dL (ref 8.9–10.3)
Chloride: 108 mmol/L (ref 98–111)
Creatinine, Ser: 1.57 mg/dL — ABNORMAL HIGH (ref 0.61–1.24)
GFR, Estimated: 42 mL/min — ABNORMAL LOW (ref >60)
Glucose, Bld: 110 mg/dL — ABNORMAL HIGH (ref 70–99)
Potassium: 4.3 mmol/L (ref 3.5–5.1)
Sodium: 143 mmol/L (ref 135–145)
Total Bilirubin: 1.4 mg/dL — ABNORMAL HIGH (ref 0.0–1.2)
Total Protein: 7.3 g/dL (ref 6.5–8.1)

## 2024-05-08 LAB — FERRITIN: Ferritin: 318 ng/mL (ref 24–336)

## 2024-05-08 LAB — IRON AND TIBC
Iron: 206 ug/dL — ABNORMAL HIGH (ref 45–182)
Saturation Ratios: 61 % — ABNORMAL HIGH (ref 17.9–39.5)
TIBC: 336 ug/dL (ref 250–450)
UIBC: 130 ug/dL

## 2024-05-08 LAB — MAGNESIUM: Magnesium: 1.8 mg/dL (ref 1.7–2.4)

## 2024-05-08 LAB — FOLATE: Folate: 15.8 ng/mL (ref >5.9)

## 2024-05-08 LAB — VITAMIN B12: Vitamin B-12: 1863 pg/mL — ABNORMAL HIGH (ref 180–914)

## 2024-05-08 NOTE — Patient Instructions (Addendum)
 [facility] Discharge Instructions   You were seen and examined today by Dr. Davonna.  She reviewed the results of your lab work which are normal/stable.   Continue Xeloda  as prescribed. We will hold the Avastin  for now due to the high amount of protein in your urine. See the nephrology as scheduled.   Return as scheduled.    Thank you for choosing  Cancer Center at [facility] to provide your oncology and hematology care.  To afford each patient quality time with our provider, please arrive at least 15 minutes before your scheduled appointment time.   If you have a lab appointment with the Cancer Center please come in thru the Main Entrance and check in at the main information desk.  You need to re-schedule your appointment should you arrive 10 or more minutes late.  We strive to give you quality time with our providers, and arriving late affects you and other patients whose appointments are after yours.  Also, if you no show three or more times for appointments you may be dismissed from the clinic at the providers discretion.     Again, thank you for choosing [facility].  Our hope is that these requests will decrease the amount of time that you wait before being seen by our physicians.       _____________________________________________________________  Should you have questions after your visit to [facility], please contact our office at [phone] and follow the prompts.  Our office hours are 8:00 a.m. and 4:30 p.m. Monday - Friday.  Please note that voicemails left after 4:00 p.m. may not be returned until the following business day.  We are closed weekends and major holidays.  You do have access to a nurse 24-7, just call the main number to the clinic [phone] and do not press any options, hold on the line and a nurse will answer the phone.    For prescription refill requests, have your pharmacy  contact our office and allow 72 hours.    Due to Covid, you will need to wear a mask upon entering the hospital. If you do not have a mask, a mask will be given to you at the Main Entrance upon arrival. For doctor visits, patients may have 1 support person age 64 or older with them. For treatment visits, patients can not have anyone with them due to social distancing guidelines and our immunocompromised population.

## 2024-05-08 NOTE — Progress Notes (Signed)
 Hold treatment indefinitely per [place].

## 2024-05-09 ENCOUNTER — Encounter: Payer: Self-pay | Admitting: Oncology

## 2024-05-09 ENCOUNTER — Other Ambulatory Visit: Payer: Self-pay

## 2024-05-09 LAB — CEA: CEA: 22.5 ng/mL — ABNORMAL HIGH (ref 0.0–4.7)

## 2024-05-12 ENCOUNTER — Telehealth: Payer: Self-pay | Admitting: Dietician

## 2024-05-12 ENCOUNTER — Encounter: Admitting: Dietician

## 2024-05-12 NOTE — Telephone Encounter (Signed)
 Nutrition Follow-up:  Pt with recurrent colon cancer metastatic to liver. S/p left hemicolectomy (02/17/22), right liver lobe thermal ablation (12/27/22), microwave ablation (10/10/23). Patient is currently receiving chemoimmunotherapy with xeloda  + beva q21d. Avastin  held due to proteinuria. Patient is under the care of Dr. Davonna Sermon with wife of patient via telephone. Reports patient is eating well. Always has a good breakfast (eggs, bacon, toast, decaf coffee). Sometimes will skip lunch, but will eat a good supper. Had big lunch after church (fried clams, baked potato, slaw, hush puppies). Ate a bowl of cereal for dinner due to big lunch. Drinking ~3 bottles of water. Will have sprite/ginger ale at dinner. Patient is not drinking Ensure regularly.   Medications: reviewed   Labs: 10/9 - glucose 110, BUN 34, Cr 1.57  Anthropometrics: Last wt 195 lb on 10/9 decreased 2.5% in 3 weeks - concerning   9/18 - 200 lb 12.8 oz  8/28 - 201 lb 12.8 oz  7/29 - 194 lb 3.6 oz   NUTRITION DIAGNOSIS: Food and nutrition related knowledge deficit improving    INTERVENTION:  Encourage daily Ensure Plus/equivalent to support wt maintenance  Encourage good sources of lean proteins at every meal Increase daily water as tolerated to 4 bottles - pt to f/u with nephrology next week per wife    MONITORING, EVALUATION, GOAL: wt trends, intake   NEXT VISIT: Monday October 27 via telephone

## 2024-05-14 ENCOUNTER — Encounter (INDEPENDENT_AMBULATORY_CARE_PROVIDER_SITE_OTHER): Payer: Self-pay | Admitting: Gastroenterology

## 2024-05-15 ENCOUNTER — Other Ambulatory Visit: Payer: Self-pay | Admitting: Oncology

## 2024-05-15 ENCOUNTER — Other Ambulatory Visit: Payer: Self-pay

## 2024-05-15 DIAGNOSIS — C187 Malignant neoplasm of sigmoid colon: Secondary | ICD-10-CM

## 2024-05-15 MED ORDER — CAPECITABINE 500 MG PO TABS
1000.0000 mg | ORAL_TABLET | Freq: Two times a day (BID) | ORAL | 1 refills | Status: DC
  Filled 2024-05-15: qty 56, 21d supply, fill #0
  Filled 2024-06-03: qty 56, 21d supply, fill #1

## 2024-05-15 NOTE — Telephone Encounter (Signed)
 Chart reviewed. Xeloda  refilled per last office note with Dr. Davonna.

## 2024-05-15 NOTE — Progress Notes (Signed)
 Specialty Pharmacy Refill Coordination Note  Herbert Wood is a 89 y.o. male contacted today regarding refills of specialty medication(s) Capecitabine  (XELODA )   Patient requested (Patient-Rptd) Delivery   Delivery date: 05/21/24   Verified address: (Patient-Rptd) [address] [place], [place]   [address]   Medication will be filled on 05/20/24.

## 2024-05-19 ENCOUNTER — Encounter (INDEPENDENT_AMBULATORY_CARE_PROVIDER_SITE_OTHER): Payer: Self-pay | Admitting: Gastroenterology

## 2024-05-19 ENCOUNTER — Ambulatory Visit (INDEPENDENT_AMBULATORY_CARE_PROVIDER_SITE_OTHER): Admitting: Gastroenterology

## 2024-05-19 VITALS — BP 157/67 | HR 59 | Temp 97.2°F | Ht 70.0 in | Wt 197.6 lb

## 2024-05-19 DIAGNOSIS — Z8719 Personal history of other diseases of the digestive system: Secondary | ICD-10-CM | POA: Diagnosis not present

## 2024-05-19 DIAGNOSIS — K219 Gastro-esophageal reflux disease without esophagitis: Secondary | ICD-10-CM

## 2024-05-19 MED ORDER — OMEPRAZOLE 40 MG PO CPDR: 40.0000 mg | DELAYED_RELEASE_CAPSULE | Freq: Every day | ORAL | 3 refills | Status: AC

## 2024-05-19 NOTE — Progress Notes (Signed)
 Referring Provider: Meade Bigness, MD Primary Care Physician:  Meade Bigness, MD Primary GI Physician: Dr. Eartha   Chief Complaint  Patient presents with   Follow-up    Pt arrives for follow up. From a GI standpoint, things are ok. Pt is undergoing chemo at this time.     HPI:   Herbert Wood is a 89 y.o. male with past medical history of diabetes, CKD, LLE DVT on Eliquis , colon cancer with liver metastasis, GERD, and prior diverticulitis   Patient presenting today for:  Follow up of GERD History of choledocolithiasis   Last seen 05/2023, at that time doing well with omeprazole  and carafate , no GI complaints  Recommended continue PPI daily, carafate , good reflux precautions  Notably, patient presented to [facility] ED in February with back pain, nausea, vomiting, and fever. LFTs elevated above his prior baseline. Right upper quadrant ultrasound showed gallbladder sludge, dilated CBD. Blood cultures were positive for Klebsiella. MRCP showed choledocholithiasis with mild intrahepatic biliary ductal dilation, presentation concerning for cholangitis. He was transferred to [facility] for ERCP Which was performed on 2/20-   ERCP: Choledocholithiasis was found.  Complete removal  was accomplished by biliary sphincterotomy/sphincteroplasty and balloon extraction. - Patent cystic duct.  - Cholelithiasis. Lab cholecystectomy was advised against due to vicinity of metastatic lesion to GB fossa  Last labs on 10/9 with CEA 22.5, AST 41 ALT 36 Alk phos 152, t bili 1.4 ferritin 318 iron 206 sat 61 hgb 11.6   Seen by hematology on 10/9 with plans to continue capecitabine  , consider restarting Avastin , repeat PET in 3 months as recent showed significant improvement in liver lesion/ lung nodules  Present:  Doing chemotherapy, tolerating it well. Weight is stable, he reports his appetite is good. Taking omeprazole  daily. No GERD symptoms, dysphagia. Denies rectal bleeding, he has dark stools on chronic PO  iron. He denies constipation or diarrhea. Seeing nephrologist tmrw regarding elevated Urine protein. Denies any abdominal pain, nausea or vomiting. No further symptoms like he had earlier this year with cholangitis. Overall feeling well.     PET scan 03/2024: Much improved appearance of the liver. The large hypermetabolic lesion in the right hepatic lobe is no longer hypermetabolic. There is a rim of mild FDG uptake around the lesion which could be post treatment related. Smaller peripheral hypermetabolic focus seen on the prior study has also resolved. No new hepatic lesions. 2. Persistent bilateral pulmonary nodules consistent with metastatic disease. These are slightly smaller and no longer hypermetabolic. 3. No new or progressive findings Last Colonoscopy:01/2023 - One 5 mm polyp in the ascending colon, removed                            with a cold snare. Resected and retrieved.                           - One 1 mm polyp in the ascending colon, removed                            with a cold biopsy forceps. Resected and retrieved.                           - Patent end-to-side colo-colonic anastomosis,  characterized by healthy appearing mucosa. Biopsied.                           - One 3 mm polyp in the rectum, removed with a cold                            snare. Resected and retrieved.                           - Diverticulosis in the entire examined colon.                           - Non-bleeding internal hemorrhoids. A. ASCENDING COLON, POLYPECTOMY: Tubular adenoma Negative for high-grade dysplasia and carcinoma   B. COLON, ANASTOMOSIS, POLYP, BIOPSY: Reactive colonic mucosa with activity and hyperplastic change compatible with anastomotic site Negative for dysplasia and carcinoma   C. RECTUM, POLYPECTOMY: Tubular adenoma, focally serrated Negative for high-grade dysplasia and carcinoma     Colonoscopy: 12/2021- Two 2 to 6 mm polyps in the transverse  colon,                            removed with a cold snare. Resected and retrieved.                           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 Biopsied. Tattooed(invasive moderately differentiated adenocarcinoma)                            - The distal rectum and anal verge are normal on                            retroflexion view.   Last Endoscopy:12/2021 - Esophageal mucosal changes classified as                            Barrett's stage C0-M1 per Prague criteria. Biopsied-BE                           - 5 cm hiatal hernia.                           - Erythematous mucosa in the antrum. Biopsied.                           - Duodenitis. Biopsied-chronic/peptic duodenitis   Recommendations:  Repeat EGD possibly in 3 years depending on overall health   Past Medical History:  Diagnosis Date   Adenomatous colon polyp    Anemia    Carotid bruit    Chronic dyspnea    Diabetes (HCC)    type 2   Diverticular disease    DVT (deep venous thrombosis) (HCC)    lower limb   Dyspnea    Factor V deficiency (HCC)    GERD (gastroesophageal reflux  disease)    Hearing loss    bilateral   Hyperlipemia    Hypertension    Inguinal hernia    Kidney disease    stage 3   Lung nodule    Lymphadenopathy    Malignant neoplasm (HCC)    salivary gland   Murmur    Osteoarthritis    Senile purpura     Past Surgical History:  Procedure Laterality Date   BIOPSY  01/10/2022   Procedure: BIOPSY;  Surgeon: Eartha Angelia Sieving, MD;  [facility]: [facility];  Service: Gastroenterology;;   COLON SURGERY     COLONOSCOPY WITH PROPOFOL  N/A 01/10/2022   Procedure: COLONOSCOPY WITH PROPOFOL ;  Surgeon: Eartha Angelia Sieving, MD;  Location: [facility];  Service: Gastroenterology;  Laterality: N/A;  per Anette Caldron, pt to arrive at 9:15   COLONOSCOPY WITH PROPOFOL  N/A 02/27/2023   Procedure: COLONOSCOPY WITH PROPOFOL ;  Surgeon:  Eartha Angelia Sieving, MD;  Location: [facility];  Service: Gastroenterology;  Laterality: N/A;  9:15am;asa 3   ENDOSCOPIC RETROGRADE CHOLANGIOPANCREATOGRAPHY (ERCP) WITH PROPOFOL  N/A 09/20/2023   Procedure: ENDOSCOPIC RETROGRADE CHOLANGIOPANCREATOGRAPHY (ERCP) WITH PROPOFOL ;  Surgeon: Charlanne Groom, MD;  Location: [facility] ENDOSCOPY;  Service: Gastroenterology;  Laterality: N/A;   ESOPHAGOGASTRODUODENOSCOPY (EGD) WITH PROPOFOL  N/A 01/10/2022   Procedure: ESOPHAGOGASTRODUODENOSCOPY (EGD) WITH PROPOFOL ;  Surgeon: Eartha Angelia Sieving, MD;  [facility]: [facility];  Service: Gastroenterology;  Laterality: N/A;   HAND / FINGER LESION EXCISION     HERNIA REPAIR     IR RADIOLOGIST EVAL & MGMT  04/04/2023   IR RADIOLOGIST EVAL & MGMT  08/09/2023   IR RADIOLOGIST EVAL & MGMT  02/18/2024   MASTOIDECTOMY     PILONIDAL CYST EXCISION     POLYPECTOMY  01/10/2022   Procedure: POLYPECTOMY;  Surgeon: Eartha Angelia Sieving, MD;  [facility]: [facility];  Service: Gastroenterology;;   POLYPECTOMY  02/27/2023   Procedure: POLYPECTOMY;  Surgeon: Eartha Angelia Sieving, MD;  Location: [facility];  Service: Gastroenterology;;   RADIOLOGY WITH ANESTHESIA N/A 12/27/2022   Procedure: CT microwave ablation of the liver;  Surgeon: Luverne Aran, MD;  Location: [facility];  Service: Radiology;  Laterality: N/A;   RADIOLOGY WITH ANESTHESIA N/A 10/10/2023   Procedure: CT WITH ANESTHESIA MICROWAVE ABLATION OF LIVER;  Surgeon: Luverne Aran, MD;  Location: [facility];  Service: Radiology;  Laterality: N/A;   REMOVAL OF STONES  09/20/2023   Procedure: REMOVAL OF STONES;  Surgeon: Charlanne Groom, MD;  Location: [facility] ENDOSCOPY;  Service: Gastroenterology;;   ANNETT  09/20/2023   Procedure: ANNETT;  Surgeon: Charlanne Groom, MD;  Location: [facility] ENDOSCOPY;  Service: Gastroenterology;;   ANNETT  09/20/2023   Procedure: OLEVIA;  Surgeon: Charlanne Groom, MD;  Location: [facility] ENDOSCOPY;  Service:  Gastroenterology;;   SUBMUCOSAL TATTOO INJECTION  01/10/2022   Procedure: SUBMUCOSAL TATTOO INJECTION;  Surgeon: Eartha Angelia Sieving, MD;  Location: [facility];  Service: Gastroenterology;;   SURGERY OF LIP     skin cancer    Current Outpatient Medications  Medication Sig Dispense Refill   albuterol  (VENTOLIN  HFA) 108 (90 Base) MCG/ACT inhaler Inhale 2 puffs as needed by inhalation route.     amitriptyline  (ELAVIL ) 10 MG tablet Take 10 mg by mouth every evening.     amLODipine  (NORVASC ) 5 MG tablet Take 5 mg by mouth 2 (two) times daily.     ascorbic acid  (VITAMIN C /NATURAL ROSE HIPS) 1000 MG tablet Take 1,000 mg by mouth every evening.     atorvastatin  (LIPITOR)  40 MG tablet Take 40 mg by mouth every evening.     capecitabine  (XELODA ) 500 MG tablet Take 2 tablets (1,000 mg total) by mouth 2 (two) times daily after a meal. Take 2 weeks on/ 1 week off 56 tablet 1   Cholecalciferol  (VITAMIN D3) 125 MCG (5000 UT) CAPS Take 5,000 Units by mouth every evening.     dorzolamide -timolol (COSOPT) 2-0.5 % ophthalmic solution Place 1 drop into both eyes 2 (two) times daily.     ELIQUIS  5 MG TABS tablet TAKE 1 TABLET(5 MG) BY MOUTH TWICE DAILY 60 tablet 3   ferrous sulfate  325 (65 FE) MG tablet Take 325 mg by mouth daily with breakfast.     folic acid  (FOLVITE ) 1 MG tablet Take 1 tablet (1 mg total) by mouth every evening. 30 tablet 0   furosemide (LASIX) 20 MG tablet Take 1 tablet every day by oral route as needed.     hydrALAZINE  (APRESOLINE ) 25 MG tablet Take 50 mg by mouth in the morning and at bedtime.     KERENDIA  10 MG TABS Take 10 mg by mouth daily.     latanoprost  (XALATAN ) 0.005 % ophthalmic solution Place 1 drop into both eyes at bedtime.     linagliptin  (TRADJENTA ) 5 MG TABS tablet Take 5 mg by mouth in the morning.     loratadine  (CLARITIN ) 10 MG tablet Take 10 mg by mouth every morning.     LORazepam  (ATIVAN ) 0.5 MG tablet Take 1 tablet as needed by oral route.     magnesium   oxide (MAG-OX) 400 (240 Mg) MG tablet Take 1 tablet (400 mg total) by mouth daily. 90 tablet 0   Misc Natural Products (PROSTATE SUPPORT PO) Take 1 capsule by mouth every evening. NOW Prostate Support     omeprazole  (PRILOSEC) 40 MG capsule Take 1 capsule (40 mg total) by mouth daily. 90 capsule 3   ondansetron  (ZOFRAN ) 4 MG tablet Take 1 tablet (4 mg total) by mouth every 6 (six) hours as needed for nausea. 20 tablet 0   ondansetron  (ZOFRAN -ODT) 4 MG disintegrating tablet 4mg  ODT q4 hours prn nausea/vomit 10 tablet 0   prochlorperazine  (COMPAZINE ) 10 MG tablet Take 1 tablet (10 mg total) by mouth every 6 (six) hours as needed for nausea or vomiting. 30 tablet 1   tobramycin-dexamethasone  (TOBRADEX) ophthalmic solution 3 drops 3 (three) times daily.     traMADol  (ULTRAM ) 50 MG tablet Take 1 tablet (50 mg total) by mouth at bedtime as needed. 30 tablet 0   vitamin B-12 (CYANOCOBALAMIN ) 1000 MCG tablet Take 1,000 mcg by mouth every evening. Now (Brand)     No current facility-administered medications for this visit.    Allergies as of 05/19/2024 - Review Complete 05/19/2024  Allergen Reaction Noted   Gadobenate Nausea And Vomiting 05/20/2015    Social History   Socioeconomic History   Marital status: Married    Spouse name: Not on file   Number of children: Not on file   Years of education: Not on file   Highest education level: Not on file  Occupational History   Not on file  Tobacco Use   Smoking status: Former    Current packs/day: 0.00    Average packs/day: 2.0 packs/day for 25.0 years (50.0 ttl pk-yrs)    Types: Cigarettes    Start date: 42    Quit date: 11    Years since quitting: 30.8    Passive exposure: Past   Smokeless tobacco: Never  Vaping  Use   Vaping status: Never Used  Substance and Sexual Activity   Alcohol use: Never   Drug use: Never   Sexual activity: Not on file  Other Topics Concern   Not on file  Social History Narrative   Not on file   Social  Drivers of Health   Financial Resource Strain: Not on file  Food Insecurity: No Food Insecurity (10/10/2023)   Hunger Vital Sign    Worried About Running Out of Food in the Last Year: Never true    Ran Out of Food in the Last Year: Never true  Transportation Needs: No Transportation Needs (10/10/2023)   PRAPARE - Administrator, Civil Service (Medical): No    Lack of Transportation (Non-Medical): No  Physical Activity: Not on file  Stress: Not on file  Social Connections: Moderately Integrated (10/10/2023)   Social Connection and Isolation Panel    Frequency of Communication with Friends and Family: More than three times a week    Frequency of Social Gatherings with Friends and Family: Once a week    Attends Religious Services: More than 4 times per year    Active Member of [employer] or Organizations: No    Attends Engineer, structural: Never    Marital Status: Married    Review of systems General: negative for malaise, night sweats, fever, chills, weight loss Neck: Negative for lumps, goiter, pain and significant neck swelling Resp: Negative for cough, wheezing, dyspnea at rest CV: Negative for chest pain, leg swelling, palpitations, orthopnea GI: denies melena, hematochezia, nausea, vomiting, diarrhea, constipation, dysphagia, odyonophagia, early satiety or unintentional weight loss.  MSK: Negative for joint pain or swelling, back pain, and muscle pain. Derm: Negative for itching or rash Psych: Denies depression, anxiety, memory loss, confusion. No homicidal or suicidal ideation.  Heme: Negative for prolonged bleeding, bruising easily, and swollen nodes. Endocrine: Negative for cold or heat intolerance, polyuria, polydipsia and goiter. Neuro: negative for tremor, gait imbalance, syncope and seizures. The remainder of the review of systems is noncontributory.  Physical Exam: BP (!) 165/77   Pulse (!) 59   Temp (!) 97.2 F (36.2 C)   Ht 5' 10 (1.778 m)   Wt  197 lb 9.6 oz (89.6 kg)   BMI 28.35 kg/m  General:   Alert and oriented. No distress noted. Pleasant and cooperative.  Head:  Normocephalic and atraumatic. Eyes:  Conjuctiva clear without scleral icterus. Mouth:  Oral mucosa pink and moist. Good dentition. No lesions. Heart: Normal rate and rhythm, s1 and s2 heart sounds present.  Lungs: Clear lung sounds in all lobes. Respirations equal and unlabored. Abdomen:  +BS, soft, non-tender and non-distended. No rebound or guarding. No HSM or masses noted. Derm: No palmar erythema or jaundice Msk:  Symmetrical without gross deformities. Normal posture. Extremities:  Without edema. Neurologic:  Alert and  oriented x4 Psych:  Alert and cooperative. Normal mood and affect.  Invalid input(s): 6 MONTHS   ASSESSMENT: Herbert Wood is a 89 y.o. male presenting today for follow up of GERD and history of choledocolithiasis  GERD well controlled on omeprazole  20mg  daily without breakthrough, dysphagia, odynophagia, nausea, or vomiting. Will continue with current regimen.  History of cholangitis secondary to choledocolithiasis earlier this year, requiring ERCP. GB removal was deferred given multimorbidities and vicinity of metastatic liver lesion to GB fossa. He has had no further issues. Most recent LFTs WNL other than mildly elevated T bili which has been elevated since early 2024. No further  upper abdominal/back pain, nausea, fevers, no jaundice. At this time, given his extensive medical history and current medical therapy regimen, will hold off on starting Urso. Should he have recurrence of choledocolthiasis, may need to consider adding Urso to his regimen.    PLAN:  -continue omeprazole  40mg  daily -good reflux precautions -pt to make me aware of any recurrent Upper abd pain/fevers/jaundice  -consider Urso if recurrent choledocolithiasis   All questions were answered, patient verbalized understanding and is in agreement with plan as outlined above.    Follow Up: 1 year   Herbert Wood L. Mariette, MSN, APRN, AGNP-C Adult-Gerontology Nurse Practitioner [facility]  I have reviewed the note and agree with the APP's assessment as described in this progress note  Toribio Fortune, MD Gastroenterology and Hepatology [facility] Gastroenterology

## 2024-05-19 NOTE — Patient Instructions (Signed)
-  continue omeprazole  40mg  daily -good reflux precautions - make me aware of any recurrent Upper abdominal/back pain especially if accompanied by nausea/fevers/yellowing of skin or eyes  Follow up 1 year or sooner if you have new or recurrent GI issues   It was a pleasure to see you today. I want to create trusting relationships with patients and provide genuine, compassionate, and quality care. I truly value your feedback! please be on the lookout for a survey regarding your visit with me today. I appreciate your input about our visit and your time in completing this!    Allisen Pidgeon L. Joycie Aerts, MSN, APRN, AGNP-C Adult-Gerontology Nurse Practitioner [facility]enter[facility] at [facility]

## 2024-05-20 ENCOUNTER — Other Ambulatory Visit: Payer: Self-pay

## 2024-05-20 ENCOUNTER — Ambulatory Visit (INDEPENDENT_AMBULATORY_CARE_PROVIDER_SITE_OTHER): Payer: Medicare Other | Admitting: Gastroenterology

## 2024-05-26 ENCOUNTER — Telehealth: Payer: Self-pay | Admitting: Dietician

## 2024-05-26 ENCOUNTER — Inpatient Hospital Stay: Admitting: Dietician

## 2024-05-26 NOTE — Telephone Encounter (Signed)
 Nutrition Follow-up:  Pt with recurrent colon cancer metastatic to liver. S/p left hemicolectomy (02/17/22), right liver lobe thermal ablation (12/27/22), microwave ablation (10/10/23). Patient is currently receiving chemoimmunotherapy with xeloda  + beva q21d. Avastin  held due to proteinuria. Patient is under the care of Dr. Davonna Sermon with wife of patient via telephone. She reports patient has a good appetite and eating well. Wife says patient is not hydrating enough. She often encourages him to drink, however this is not always received well. Patient recently seen by nephrology. Wife is awaiting call to schedule renal US . She reports patient likely will be unable to resume avastin  secondary to proteinuria.    Medications: reviewed  Labs: no new labs for review  Anthropometrics: Last wt 197 lb 9.6 oz on 10/20 increased   10/9 - 195 lb  9/18 - 200 lb 12.8 oz  NUTRITION DIAGNOSIS: Food and nutrition related knowledge deficit improving    INTERVENTION:  Encourage daily Ensure Plus/equivalent Continue to encourage increased hydration - recommend 4 bottles H20 as able  Support and encouragement     MONITORING, EVALUATION, GOAL: wt trends, intake   NEXT VISIT: To be scheduled as needed

## 2024-05-29 ENCOUNTER — Inpatient Hospital Stay

## 2024-05-29 ENCOUNTER — Inpatient Hospital Stay: Admitting: Oncology

## 2024-06-03 ENCOUNTER — Other Ambulatory Visit: Payer: Self-pay

## 2024-06-03 NOTE — Progress Notes (Signed)
 Specialty Pharmacy Ongoing Clinical Assessment Note  Herbert Wood is a 89 y.o. male who is being followed by the specialty pharmacy service for RxSp Oncology   Patient's specialty medication(s) reviewed today: Capecitabine  (XELODA )   Missed doses in the last 4 weeks: 0   Patient/Caregiver asked additional questions regarding see intervention note below.  Therapeutic benefit summary: Patient is achieving benefit   Adverse events/side effects summary: No adverse events/side effects   Patient's therapy is appropriate to: Continue    Goals Addressed             This Visit's Progress    Slow Disease Progression   No change    Patient is initiating therapy. Patient will maintain adherence         Follow up: 3 months  Silvano LOISE Dolly Specialty Pharmacist   Clinical Intervention Note  Clinical Intervention Notes: Patient recently started on losartan. I provided detailed counseling to the patient's wife regarding the potential for Xeloda  (capecitabine ) to increase losartan exposure, as concurrent use of capecitabine  and CYP2C9 substrates may elevate plasma concentrations and the risk of related adverse effects. We reviewed possible side effects to monitor, the importance of regular blood pressure checks, and adherence to scheduled lab appointments. She demonstrated understanding, and all questions were addressed.   Clinical Intervention Outcomes: Prevention of an adverse drug event   Silvano LOISE Dolly Karel Santa

## 2024-06-03 NOTE — Progress Notes (Signed)
 Specialty Pharmacy Refill Coordination Note  Herbert Wood is a 89 y.o. male contacted today regarding refills of specialty medication(s) Capecitabine  (XELODA )   Patient requested Delivery   Delivery date: 06/12/24   Verified address: [address] [place], [place]   [id number]   Medication will be filled on: 06/11/24

## 2024-06-08 ENCOUNTER — Other Ambulatory Visit: Payer: Self-pay

## 2024-06-11 ENCOUNTER — Other Ambulatory Visit: Payer: Self-pay

## 2024-06-17 ENCOUNTER — Other Ambulatory Visit: Payer: Self-pay | Admitting: Oncology

## 2024-06-17 ENCOUNTER — Inpatient Hospital Stay (HOSPITAL_BASED_OUTPATIENT_CLINIC_OR_DEPARTMENT_OTHER): Admitting: Oncology

## 2024-06-17 ENCOUNTER — Inpatient Hospital Stay: Attending: Hematology

## 2024-06-17 DIAGNOSIS — C787 Secondary malignant neoplasm of liver and intrahepatic bile duct: Secondary | ICD-10-CM

## 2024-06-17 DIAGNOSIS — C187 Malignant neoplasm of sigmoid colon: Secondary | ICD-10-CM

## 2024-06-17 DIAGNOSIS — C189 Malignant neoplasm of colon, unspecified: Secondary | ICD-10-CM

## 2024-06-17 LAB — COMPREHENSIVE METABOLIC PANEL WITH GFR
ALT: 29 U/L (ref 0–44)
AST: 37 U/L (ref 15–41)
Albumin: 3.8 g/dL (ref 3.5–5.0)
Alkaline Phosphatase: 131 U/L — ABNORMAL HIGH (ref 38–126)
Anion gap: 10 (ref 5–15)
BUN: 30 mg/dL — ABNORMAL HIGH (ref 8–23)
CO2: 24 mmol/L (ref 22–32)
Calcium: 9 mg/dL (ref 8.9–10.3)
Chloride: 108 mmol/L (ref 98–111)
Creatinine, Ser: 1.57 mg/dL — ABNORMAL HIGH (ref 0.61–1.24)
GFR, Estimated: 42 mL/min — ABNORMAL LOW (ref >60)
Glucose, Bld: 117 mg/dL — ABNORMAL HIGH (ref 70–99)
Potassium: 4 mmol/L (ref 3.5–5.1)
Sodium: 142 mmol/L (ref 135–145)
Total Bilirubin: 1.5 mg/dL — ABNORMAL HIGH (ref 0.0–1.2)
Total Protein: 6.3 g/dL — ABNORMAL LOW (ref 6.5–8.1)

## 2024-06-17 LAB — CBC WITH DIFFERENTIAL/PLATELET
Abs Immature Granulocytes: 0.01 K/uL (ref 0.00–0.07)
Basophils Absolute: 0.1 K/uL (ref 0.0–0.1)
Basophils Relative: 1 %
Eosinophils Absolute: 0.4 K/uL (ref 0.0–0.5)
Eosinophils Relative: 5 %
HCT: 31.5 % — ABNORMAL LOW (ref 39.0–52.0)
Hemoglobin: 10.8 g/dL — ABNORMAL LOW (ref 13.0–17.0)
Immature Granulocytes: 0 %
Lymphocytes Relative: 13 %
Lymphs Abs: 0.9 K/uL (ref 0.7–4.0)
MCH: 38.7 pg — ABNORMAL HIGH (ref 26.0–34.0)
MCHC: 34.3 g/dL (ref 30.0–36.0)
MCV: 112.9 fL — ABNORMAL HIGH (ref 80.0–100.0)
Monocytes Absolute: 0.8 K/uL (ref 0.1–1.0)
Monocytes Relative: 12 %
Neutro Abs: 4.4 K/uL (ref 1.7–7.7)
Neutrophils Relative %: 69 %
Platelets: 128 K/uL — ABNORMAL LOW (ref 150–400)
RBC: 2.79 MIL/uL — ABNORMAL LOW (ref 4.22–5.81)
RDW: 17 % — ABNORMAL HIGH (ref 11.5–15.5)
Smear Review: NORMAL
WBC: 6.5 K/uL (ref 4.0–10.5)
nRBC: 0 % (ref 0.0–0.2)

## 2024-06-17 LAB — URINALYSIS, DIPSTICK ONLY
Bilirubin Urine: NEGATIVE
Glucose, UA: NEGATIVE mg/dL
Hgb urine dipstick: NEGATIVE
Ketones, ur: NEGATIVE mg/dL
Nitrite: NEGATIVE
Protein, ur: >=300 mg/dL — AB
Specific Gravity, Urine: 1.016 (ref 1.005–1.030)
pH: 5 (ref 5.0–8.0)

## 2024-06-17 LAB — MAGNESIUM: Magnesium: 1.8 mg/dL (ref 1.7–2.4)

## 2024-06-17 MED ORDER — MAGNESIUM OXIDE -MG SUPPLEMENT 400 (240 MG) MG PO TABS: 400.0000 mg | ORAL_TABLET | Freq: Every day | ORAL | 0 refills | Status: AC

## 2024-06-17 NOTE — Progress Notes (Signed)
 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  New lung metastasis. Patient is currently on capecitabine  and bevacizumab .  Because of advanced age FOLFOX was not preferred Patient has seen Dr. Luverne for a Y90.  Because of the size of the lesions was recommended systemic treatment and can be considered for Y90 or SBRT later down the line.   - Labs reviewed today: Creatinine: 1.57 (stable), CBC: Hemoglobin: 10.8, CEA: Pending. - Will repeat CEA with every cycle.  - 24-hour urine from 06/12/2024 protein with 2929 mg of protein.  Continue to hold bevacizumab  at this time. -Continue capecitabine  at 1000 mg twice daily to be taken 2 weeks on 1 week off.   Reports no side effects from this. -Can consider restarting Avastin  after repeating a 24-hour urine at progression - PET scan (September 2025) showed significant improvement in the liver lesion in the lung nodules. Will repeat PET scan in December 2025. - Recommended using urea cream and diclofenac cream for hands and feet to avoid hand-foot syndrome   Return to clinic in 4 weeks with labs  for follow up and to review PET scan.   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 Held to prevent kidney damage.  - Hold Avastin  therapy. -  Reassess urine protein levels at progression and consider restarting. -Most recent 24-hour urine showed improvement of urine protein from 3453 down to 2929. -Most recently losartan was increased from 25 mg to 50 mg.  He continues hydralazine  and Kerendia .  Discussion potentially to stop hydralazine  if blood pressures continue to remain WNL. -Most recent ultrasound of his kidneys did not reveal hydronephrosis. - Continue to follow-up with nephrology.  Chronic kidney disease Potentially exacerbated by Avastin . Referred to nephrology for nephrotic syndrome evaluation. Continue follow-up with nephrology.  He was seen last week and losartan was increased from 25 to 50 mg.  He continues hydralazine  and Kerendia .  He will return to see him in about 3 months. Most recent 24-hour urine shows improvement of proteinuria.  History of DVT Patient has a history of left leg DVT maybe around 2020.  Patient's wife thought it was likely after second COVID infection Patient also has a documented history of factor V Leiden deficiency.  I could not find this report.   -Considering current metastatic colon cancer, continue Eliquis  5mg  twice daily.  Iron deficiency anemia Hemoglobin is slightly lower than it was a month ago. Continue to monitor for now. Last iron labs within normal limits Recheck iron levels at his next visit.   - Will continue to monitor at this time every 3 months  Chronic nasal drainage Claritin  ineffective.  -Will do a trial of Flonase for nasal drainage. - Continue Claritin .  General Health Maintenance Discussed flu and COVID booster vaccines. Timing not critical. - Receive flu and COVID booster vaccines. - Received  flu shot on 06/12/2024.  The patient understands the plans discussed today and is in agreement with them.  He knows to contact our office if he develops concerns prior to his next appointment.  I spent 25 minutes dedicated to the care of this patient (face-to-face  and non-face-to-face) on the date of the encounter to include what is described in the assessment and plan.   Delon FORBES Hope, NP  [facility] [place] - A DEPT OF [facility] [address] [place] [address] Dept: [phone] Dept Fax: [phone]   Orders Placed This Encounter  Procedures   NM PET Image Restag (PS) Skull Base To Thigh    Standing Status:   Future    Expected Date:   07/10/2024    Expiration Date:   06/17/2025    If indicated for the ordered procedure, I authorize the administration of a radiopharmaceutical per Radiology protocol:   Yes    Preferred imaging location?:   [facility]     ONCOLOGY HISTORY:   I have reviewed his chart and materials related to his cancer extensively and collaborated history with the patient. 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 -01/10/2022: Colonoscopy: A fungating, ulcerated, partially obstructing mass found at 42 cm proximal to the anus. Two 2-6 mm polyps in the transverse colon.  Pathology: Invasive moderately differentiated adenocarcinoma. -01/13/2022: CT CAP: No evidence of metastatic disease in the chest, abdomen, or pelvis. -02/17/2022: Left hemicolectomy.  Pathology: Moderately differentiated invasive adenocarcinoma in the descending colon, 5.8 cm, with clear margins of resection and negative mesenteric lymph nodes.  MMR preserved. pT3 pN0.  -07/13/2022: CT CAP:  Interval partial colectomy without evidence of local recurrence. No convincing evidence of metastatic disease within the chest, abdomen, or pelvis. -10/19/2022: CEA: 20.9 -11/09/2022: PET: Single hypermetabolic metastasis inferiorly in the right hepatic lobe. No other evidence of metastatic disease. Enlarging  subcutaneous nodule posteriorly in the lower left chest with low level metabolic activity. -11/22/2022: MRI Liver: Isolated inferior right hepatic lobe 2.8 cm metastasis.  -12/27/2022: Thermal ablation of right lobe liver lesion -09/18/2023: MRI abdomen: Choledocholithiasis. Known metastasis of the inferior right lobe of the liver, hepatic segment VI. -10/08/2023: CEA: 94.8 -10/10/2023: Percutaneous microwave thermal ablation of right lobe liver mass -01/11/2024: CEA: 36.9 -01/11/2024: CT CAP: Numerous new and enlarged bilateral pulmonary nodules, consistent with worsened pulmonary metastatic disease. Expected appearance of ablation site of hepatic segment VI with overlying capsular retraction. No new liver lesions appreciated by CT. -01/11/2024: MRI liver: Interval ablation of a metastasis of hepatic segment VI. Residual or recurrent rim enhancing tumor at the superior margin of the ablation site measuring 4.1 x 3.6 x 1.8 cm. Pulmonary nodules in the included lung bases, consistent with metastatic disease -01/24/2024: PET: Hypermetabolic right hepatic lobe masses, compatible with metastatic disease. The larger lesion has increased in size and metabolic activity compared to the prior PET scan. There is also a new smaller satellite right hepatic lobe lesion which is hypermetabolic. Scattered hypermetabolic pulmonary nodules, new compared to the prior chest CT but shown on the diagnostic CT from 01/11/2024. These are compatible with pulmonary metastases. -02/04/2024: Liver lesion biopsy.  -Pathology: Adenocarcinoma consistent with recurrent metastatic colonic adenocarcinoma.  -NGS: KRAS G13D, MSI-stable, TMB-low, HER2 (0), ATM, SMAD4, APC  -Guardant 360:  KRAS G13D, K-ras K117N, ATM [id number].  UG T1A1 *28/*28 for metabolizer.  -02/18/2024:Seen Dr. Luverne for a Y90.  Because of the size of the lesions was recommended systemic treatment and can be considered for Y90 or SBRT later down the line. -02/26/2024  to current: Xeloda  1,000 mg BID 2 weeks on/1 week off -03/06/2024 to 04/17/2024: Bevacizumab  every 3 weeks.  Held on 05/09/2024 for 24-hour urine protein of 3875mg . -03/20/2024: Germline mutation testing: Negative -04/10/2024: PET scan: Much improved appearance of the liver. The large hypermetabolic lesion in the right hepatic lobe is no longer hypermetabolic. No new hepatic lesions.Persistent bilateral pulmonary nodules consistent with metastatic disease. These are slightly smaller and no longer hypermetabolic. No new or progressive findings.   Diagnosis: Adenoid cystic carcinoma  -06/2015: Resection of left upper lip.  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 -08/11/2015: Adjuvant radiation therapy, 50 Gray, completed  Current Treatment:  Capecitabine  1000mg  BID   INTERVAL HISTORY:   Discussed the use of AI scribe software for clinical note transcription with the patient, who gave verbal consent to proceed.  History of Present Illness Herbert Wood is an 89 year old male with metastatic colon cancer who presents for follow-up and chemotherapy.  Patient is here for next cycle of Xeloda .  Previously receiving Avastin  but this is held secondary to increased urine protein levels with 24-hour urine protein > 3 g.  Reports no complaints from Xeloda .  Patient is working with nephrology on his urine protein.  He was seen the other day and several of his medications were adjusted.  His losartan was increased from 25 to 50 mg.  He is still on hydralazine  and Kenedia as well.  He thinks he will be able to come off of his hydralazine  if his blood pressures stay under control.  He continues magnesium  supplements for low magnesium  levels in the past.  Has some chronic shortness of breath with exertion, dizziness and numbness and burning in his feet.  Appetite and energy levels are 100%.  He recently had a ultrasound of his liver which showed known liver lesion.  He will be due for  a follow-up PET scan in December.  I have reviewed the past medical history, past surgical history, social history and family history with the patient and they are unchanged from previous note.  ALLERGIES:  is allergic to gadobenate, diclofenac, and iodinated contrast media.  MEDICATIONS:  Current Outpatient Medications  Medication Sig Dispense Refill   albuterol  (VENTOLIN  HFA) 108 (90 Base) MCG/ACT inhaler Inhale 2 puffs as needed by inhalation route.     amitriptyline  (ELAVIL ) 10 MG tablet Take 10 mg by mouth every evening.     amLODipine  (NORVASC ) 5 MG tablet Take 5 mg by mouth 2 (two) times daily.     ascorbic acid  (VITAMIN C /NATURAL ROSE HIPS) 1000 MG tablet Take 1,000 mg by mouth every evening.     atorvastatin  (LIPITOR) 40 MG tablet Take 40 mg by mouth every evening.     capecitabine  (XELODA ) 500 MG tablet Take 2 tablets (1,000 mg total) by mouth 2 (two) times daily after a meal. Take 2 weeks on/ 1 week off 56 tablet 1   Cholecalciferol  (VITAMIN D3) 125 MCG (5000 UT) CAPS Take 5,000 Units by mouth every evening.     dorzolamide -timolol (COSOPT) 2-0.5 % ophthalmic solution Place 1 drop into both eyes 2 (two) times daily.     ELIQUIS  5 MG TABS tablet TAKE 1 TABLET(5 MG) BY MOUTH TWICE DAILY 60 tablet 3  ferrous sulfate  325 (65 FE) MG tablet Take 325 mg by mouth daily with breakfast.     folic acid  (FOLVITE ) 1 MG tablet Take 1 tablet (1 mg total) by mouth every evening. 30 tablet 0   furosemide (LASIX) 20 MG tablet Take 1 tablet every day by oral route as needed.     hydrALAZINE  (APRESOLINE ) 25 MG tablet Take 50 mg by mouth in the morning and at bedtime.     KERENDIA  10 MG TABS Take 10 mg by mouth daily.     latanoprost  (XALATAN ) 0.005 % ophthalmic solution Place 1 drop into both eyes at bedtime.     linagliptin  (TRADJENTA ) 5 MG TABS tablet Take 5 mg by mouth in the morning.     loratadine  (CLARITIN ) 10 MG tablet Take 10 mg by mouth every morning.     LORazepam  (ATIVAN ) 0.5 MG tablet  Take 1 tablet as needed by oral route.     losartan (COZAAR) 50 MG tablet Take 50 mg by mouth daily.     Misc Natural Products (PROSTATE SUPPORT PO) Take 1 capsule by mouth every evening. NOW Prostate Support     omeprazole  (PRILOSEC) 40 MG capsule Take 1 capsule (40 mg total) by mouth daily. 90 capsule 3   traMADol  (ULTRAM ) 50 MG tablet Take 1 tablet (50 mg total) by mouth at bedtime as needed. 30 tablet 0   vitamin B-12 (CYANOCOBALAMIN ) 1000 MCG tablet Take 1,000 mcg by mouth every evening. Now (Brand)     magnesium  oxide (MAG-OX) 400 (240 Mg) MG tablet Take 1 tablet (400 mg total) by mouth daily. 90 tablet 0   ondansetron  (ZOFRAN ) 4 MG tablet Take 1 tablet (4 mg total) by mouth every 6 (six) hours as needed for nausea. (Patient not taking: Reported on 06/17/2024) 20 tablet 0   ondansetron  (ZOFRAN -ODT) 4 MG disintegrating tablet 4mg  ODT q4 hours prn nausea/vomit (Patient not taking: Reported on 06/17/2024) 10 tablet 0   prochlorperazine  (COMPAZINE ) 10 MG tablet Take 1 tablet (10 mg total) by mouth every 6 (six) hours as needed for nausea or vomiting. (Patient not taking: Reported on 06/17/2024) 30 tablet 1   tobramycin-dexamethasone  (TOBRADEX) ophthalmic solution 3 drops 3 (three) times daily. (Patient not taking: Reported on 06/17/2024)     No current facility-administered medications for this visit.    REVIEW OF SYSTEMS:   Review of Systems  Constitutional:  Positive for malaise/fatigue.  Respiratory:  Positive for shortness of breath.   Neurological:  Positive for dizziness, tingling and sensory change.      VITALS:  Blood pressure (!) 132/57, pulse (!) 54, temperature 98.1 F (36.7 C), temperature source Tympanic, resp. rate 17, height 5' 10 (1.778 m), weight 198 lb (89.8 kg), SpO2 97%.  Wt Readings from Last 3 Encounters:  06/17/24 198 lb (89.8 kg)  05/19/24 197 lb 9.6 oz (89.6 kg)  05/08/24 195 lb (88.5 kg)    Body mass index is 28.41 kg/m.  Performance status (ECOG): 1 -  Symptomatic but completely ambulatory  PHYSICAL EXAM:   GENERAL:alert, no distress and comfortable SKIN: skin color, texture, turgor are normal, no rashes or significant lesions LYMPH:  no palpable lymphadenopathy in the cervical, axillary or inguinal LUNGS: clear to auscultation and percussion with normal breathing effort HEART: regular rate & rhythm and no murmurs and no lower extremity edema ABDOMEN:abdomen soft, non-tender and normal bowel sounds Musculoskeletal:no cyanosis of digits and no clubbing  NEURO: alert & oriented x 3 with fluent speech, no focal motor/sensory deficits  LABORATORY DATA:  I have reviewed the data as listed   Lab Results  Component Value Date   WBC 6.5 06/17/2024   NEUTROABS 4.4 06/17/2024   HGB 10.8 (L) 06/17/2024   HCT 31.5 (L) 06/17/2024   MCV 112.9 (H) 06/17/2024   PLT 128 (L) 06/17/2024      Chemistry      Component Value Date/Time   NA 142 06/17/2024 0820   K 4.0 06/17/2024 0820   CL 108 06/17/2024 0820   CO2 24 06/17/2024 0820   BUN 30 (H) 06/17/2024 0820   CREATININE 1.57 (H) 06/17/2024 0820      Component Value Date/Time   CALCIUM  9.0 06/17/2024 0820   ALKPHOS 131 (H) 06/17/2024 0820   AST 37 06/17/2024 0820   ALT 29 06/17/2024 0820   BILITOT 1.5 (H) 06/17/2024 0820       Latest Reference Range & Units 05/08/24 12:56  Appearance CLEAR  CLEAR  Bilirubin Urine NEGATIVE  NEGATIVE  Color, Urine YELLOW  YELLOW  Glucose, UA NEGATIVE mg/dL NEGATIVE  Hgb urine dipstick NEGATIVE  NEGATIVE  Ketones, ur NEGATIVE mg/dL NEGATIVE  Leukocytes,Ua NEGATIVE  NEGATIVE  Nitrite NEGATIVE  NEGATIVE  pH 5.0 - 8.0  5.0  Protein NEGATIVE mg/dL >=699 !  Specific Gravity, Urine 1.005 - 1.030  1.012  !: Data is abnormal   Latest Reference Range & Units 04/24/24 10:42  Urine Total Volume-UPROT mL 2,600  Collection Interval-UPROT hours 24  Protein, Urine mg/dL 850  Protein, 75Y Urine 50 - 100 mg/day 3,874 (H)  (H): Data is abnormally high    Latest Reference Range & Units 05/08/24 12:56  CEA 0.0 - 4.7 ng/mL 22.5 (H)  (H): Data is abnormally high   Latest Reference Range & Units 05/08/24 12:55  Iron 45 - 182 ug/dL 793 (H)  UIBC ug/dL 869  TIBC 749 - 549 ug/dL 663  Saturation Ratios 17.9 - 39.5 % 61 (H)  Ferritin 24 - 336 ng/mL 318  Folate >5.9 ng/mL 15.8  Vitamin B12 180 - 914 pg/mL 1,863 (H)  (H): Data is abnormally high  RADIOGRAPHIC STUDIES: I have personally reviewed the radiological images as listed and agreed with the findings in the report.  NM PET Image Restag (PS) Skull Base To Thigh CLINICAL DATA:  Subsequent treatment strategy for metastatic colon cancer.  EXAM: NUCLEAR MEDICINE PET SKULL BASE TO THIGH  TECHNIQUE: 9.91 mCi F-18 FDG was injected intravenously. Full-ring PET imaging was performed from the skull base to thigh after the radiotracer. CT data was obtained and used for attenuation correction and anatomic localization.  Fasting blood glucose: 153 mg/dl  COMPARISON:  Numerous prior imaging studies. The most recent PET-CT is 01/04/2024.  FINDINGS: Mediastinal blood pool activity: SUV max 2.3  Liver activity: SUV max NA  NECK: No hypermetabolic lymph nodes in the neck.  Incidental CT findings: Stable scattered carotid artery calcifications.  CHEST: No hypermetabolic pulmonary lesions. No enlarged or hypermetabolic mediastinal or hilar lymph nodes. No supraclavicular axillary adenopathy.  Persistent bilateral scattered pulmonary nodules in both lungs consistent with metastatic disease. 10 mm right lower lobe nodule on image 78/202 previously measured 12 mm. 8 mm left lower lobe nodule on image 61/202 previously measured 8 mm. 11 mm right lower lobe nodule on image 74/2002 previously measured 13 mm. No new or progressive findings. These nodules demonstrated low level hypermetabolism on the prior study but are no longer hypermetabolic.  Incidental CT findings: Stable age related  vascular disease.  ABDOMEN/PELVIS: Much improved  appearance of the liver. The large hypermetabolic lesion in the right hepatic lobe seen on the prior study is no longer hypermetabolic. There is a rim of mild FDG uptake around lesion with SUV max of 3.6 which could be post treatment related. Smaller peripheral hypermetabolic focus seen on the prior study has also resolved. No new hepatic lesions are identified.  No enlarged or hypermetabolic abdominopelvic lymph nodes. No hypermetabolic lesions in the pancreas spleen adrenal glands.  Incidental CT findings: Stable advanced vascular disease. Stable anterior abdominal hernia containing bowel loops. Stable right renal cyst and colonic diverticulosis.  SKELETON: No findings suspicious for osseous metastatic disease.  Incidental CT findings: None.  IMPRESSION: 1. Much improved appearance of the liver. The large hypermetabolic lesion in the right hepatic lobe is no longer hypermetabolic. There is a rim of mild FDG uptake around the lesion which could be post treatment related. Smaller peripheral hypermetabolic focus seen on the prior study has also resolved. No new hepatic lesions. 2. Persistent bilateral pulmonary nodules consistent with metastatic disease. These are slightly smaller and no longer hypermetabolic. 3. No new or progressive findings.  Electronically Signed   By: MYRTIS Stammer M.D.   On: 04/15/2024 12:09

## 2024-06-18 LAB — CEA: CEA: 13.3 ng/mL — ABNORMAL HIGH (ref 0.0–4.7)

## 2024-06-19 ENCOUNTER — Other Ambulatory Visit: Payer: Self-pay

## 2024-06-25 ENCOUNTER — Other Ambulatory Visit: Payer: Self-pay | Admitting: Oncology

## 2024-06-25 ENCOUNTER — Other Ambulatory Visit (HOSPITAL_COMMUNITY): Payer: Self-pay

## 2024-06-25 DIAGNOSIS — C187 Malignant neoplasm of sigmoid colon: Secondary | ICD-10-CM

## 2024-06-29 ENCOUNTER — Other Ambulatory Visit: Payer: Self-pay

## 2024-06-30 ENCOUNTER — Other Ambulatory Visit: Payer: Self-pay

## 2024-06-30 ENCOUNTER — Other Ambulatory Visit: Payer: Self-pay | Admitting: Oncology

## 2024-06-30 DIAGNOSIS — C187 Malignant neoplasm of sigmoid colon: Secondary | ICD-10-CM

## 2024-07-01 ENCOUNTER — Other Ambulatory Visit: Payer: Self-pay

## 2024-07-01 ENCOUNTER — Other Ambulatory Visit (HOSPITAL_COMMUNITY): Payer: Self-pay

## 2024-07-01 ENCOUNTER — Encounter: Payer: Self-pay | Admitting: Oncology

## 2024-07-01 MED ORDER — CAPECITABINE 500 MG PO TABS
1000.0000 mg | ORAL_TABLET | Freq: Two times a day (BID) | ORAL | 1 refills | Status: AC
  Filled 2024-07-01: qty 56, 14d supply, fill #0
  Filled 2024-07-02: qty 56, 21d supply, fill #0
  Filled 2024-07-14 – 2024-07-18 (×2): qty 56, 21d supply, fill #1

## 2024-07-01 NOTE — Telephone Encounter (Signed)
 Chart reviewed. Xeloda  refilled per last office note with Delon Hope, NP.

## 2024-07-02 ENCOUNTER — Other Ambulatory Visit: Payer: Self-pay

## 2024-07-02 ENCOUNTER — Other Ambulatory Visit (HOSPITAL_COMMUNITY): Payer: Self-pay

## 2024-07-02 NOTE — Progress Notes (Signed)
 Specialty Pharmacy Refill Coordination Note  Herbert Wood is a 89 y.o. male contacted today regarding refills of specialty medication(s) Capecitabine  (XELODA )   Patient requested Delivery   Delivery date: 07/04/24   Verified address: [address] [place], [place]   [address]   Medication will be filled on: 07/03/24

## 2024-07-03 ENCOUNTER — Other Ambulatory Visit: Payer: Self-pay

## 2024-07-03 ENCOUNTER — Other Ambulatory Visit: Payer: Self-pay | Admitting: *Deleted

## 2024-07-03 DIAGNOSIS — Z86718 Personal history of other venous thrombosis and embolism: Secondary | ICD-10-CM

## 2024-07-03 MED ORDER — APIXABAN 5 MG PO TABS: 5.0000 mg | ORAL_TABLET | Freq: Two times a day (BID) | ORAL | 3 refills | Status: AC

## 2024-07-10 ENCOUNTER — Ambulatory Visit (HOSPITAL_COMMUNITY)
Admission: RE | Admit: 2024-07-10 | Discharge: 2024-07-10 | Disposition: A | Source: Ambulatory Visit | Attending: Oncology | Admitting: Oncology

## 2024-07-10 DIAGNOSIS — C787 Secondary malignant neoplasm of liver and intrahepatic bile duct: Secondary | ICD-10-CM

## 2024-07-10 MED ORDER — FLUDEOXYGLUCOSE F - 18 (FDG) INJECTION
10.1900 | Freq: Once | INTRAVENOUS | Status: AC | PRN
  Administered 2024-07-10: 10.19 via INTRAVENOUS

## 2024-07-14 ENCOUNTER — Other Ambulatory Visit: Payer: Self-pay

## 2024-07-18 ENCOUNTER — Inpatient Hospital Stay: Attending: Hematology

## 2024-07-18 ENCOUNTER — Inpatient Hospital Stay: Admitting: Oncology

## 2024-07-18 VITALS — BP 168/54 | HR 48 | Temp 98.5°F | Resp 16 | Wt 200.0 lb

## 2024-07-18 DIAGNOSIS — Z86718 Personal history of other venous thrombosis and embolism: Secondary | ICD-10-CM | POA: Diagnosis not present

## 2024-07-18 DIAGNOSIS — C187 Malignant neoplasm of sigmoid colon: Secondary | ICD-10-CM | POA: Diagnosis not present

## 2024-07-18 DIAGNOSIS — I10 Essential (primary) hypertension: Secondary | ICD-10-CM | POA: Diagnosis not present

## 2024-07-18 DIAGNOSIS — I158 Other secondary hypertension: Secondary | ICD-10-CM | POA: Diagnosis not present

## 2024-07-18 DIAGNOSIS — D6481 Anemia due to antineoplastic chemotherapy: Secondary | ICD-10-CM | POA: Diagnosis not present

## 2024-07-18 DIAGNOSIS — C189 Malignant neoplasm of colon, unspecified: Secondary | ICD-10-CM

## 2024-07-18 DIAGNOSIS — T451X5A Adverse effect of antineoplastic and immunosuppressive drugs, initial encounter: Secondary | ICD-10-CM | POA: Diagnosis not present

## 2024-07-18 DIAGNOSIS — C7800 Secondary malignant neoplasm of unspecified lung: Secondary | ICD-10-CM | POA: Insufficient documentation

## 2024-07-18 DIAGNOSIS — N049 Nephrotic syndrome with unspecified morphologic changes: Secondary | ICD-10-CM | POA: Insufficient documentation

## 2024-07-18 DIAGNOSIS — D6851 Activated protein C resistance: Secondary | ICD-10-CM | POA: Insufficient documentation

## 2024-07-18 LAB — COMPREHENSIVE METABOLIC PANEL WITH GFR
ALT: 26 U/L (ref 0–44)
AST: 37 U/L (ref 15–41)
Albumin: 3.7 g/dL (ref 3.5–5.0)
Alkaline Phosphatase: 126 U/L (ref 38–126)
Anion gap: 14 (ref 5–15)
BUN: 38 mg/dL — ABNORMAL HIGH (ref 8–23)
CO2: 18 mmol/L — ABNORMAL LOW (ref 22–32)
Calcium: 8.8 mg/dL — ABNORMAL LOW (ref 8.9–10.3)
Chloride: 109 mmol/L (ref 98–111)
Creatinine, Ser: 1.71 mg/dL — ABNORMAL HIGH (ref 0.61–1.24)
GFR, Estimated: 38 mL/min — ABNORMAL LOW
Glucose, Bld: 124 mg/dL — ABNORMAL HIGH (ref 70–99)
Potassium: 4.4 mmol/L (ref 3.5–5.1)
Sodium: 141 mmol/L (ref 135–145)
Total Bilirubin: 1.4 mg/dL — ABNORMAL HIGH (ref 0.0–1.2)
Total Protein: 6.4 g/dL — ABNORMAL LOW (ref 6.5–8.1)

## 2024-07-18 LAB — CBC WITH DIFFERENTIAL/PLATELET
Abs Immature Granulocytes: 0.02 K/uL (ref 0.00–0.07)
Basophils Absolute: 0 K/uL (ref 0.0–0.1)
Basophils Relative: 1 %
Eosinophils Absolute: 0.1 K/uL (ref 0.0–0.5)
Eosinophils Relative: 3 %
HCT: 28.7 % — ABNORMAL LOW (ref 39.0–52.0)
Hemoglobin: 10 g/dL — ABNORMAL LOW (ref 13.0–17.0)
Immature Granulocytes: 0 %
Lymphocytes Relative: 21 %
Lymphs Abs: 1 K/uL (ref 0.7–4.0)
MCH: 40 pg — ABNORMAL HIGH (ref 26.0–34.0)
MCHC: 34.8 g/dL (ref 30.0–36.0)
MCV: 114.8 fL — ABNORMAL HIGH (ref 80.0–100.0)
Monocytes Absolute: 0.6 K/uL (ref 0.1–1.0)
Monocytes Relative: 12 %
Neutro Abs: 3.1 K/uL (ref 1.7–7.7)
Neutrophils Relative %: 63 %
Platelets: 106 K/uL — ABNORMAL LOW (ref 150–400)
RBC: 2.5 MIL/uL — ABNORMAL LOW (ref 4.22–5.81)
RDW: 15.4 % (ref 11.5–15.5)
Smear Review: NORMAL
WBC: 4.9 K/uL (ref 4.0–10.5)
nRBC: 0 % (ref 0.0–0.2)

## 2024-07-18 LAB — URINALYSIS, DIPSTICK ONLY
Bilirubin Urine: NEGATIVE
Glucose, UA: NEGATIVE mg/dL
Hgb urine dipstick: NEGATIVE
Ketones, ur: NEGATIVE mg/dL
Nitrite: NEGATIVE
Protein, ur: >=300 mg/dL — AB
Specific Gravity, Urine: 1.016 (ref 1.005–1.030)
pH: 5 (ref 5.0–8.0)

## 2024-07-18 LAB — MAGNESIUM: Magnesium: 1.9 mg/dL (ref 1.7–2.4)

## 2024-07-18 NOTE — Progress Notes (Unsigned)
 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  New lung metastasis. Patient is currently on capecitabine  and bevacizumab .  Because of advanced age FOLFOX was not preferred Patient has seen Dr. Luverne for a Y90.  Because of the size of the lesions was recommended systemic treatment and can be considered for Y90 or SBRT later down the line.   - Labs reviewed today: Creatinine: 1.5(stable), CBC: Hemoglobin: 11.6(Stable), CEA:22.5 -CEA improved from prior.  Will repeat CEA with every cycle - 24-hour urine protein with 3874 mg of protein.  Will hold bevacizumab  at this time. -Continue capecitabine  at 1000 mg twice daily to be taken 2 weeks on 1 week off.   Reports no side effects from this. -Can consider restarting Avastin  after repeating a 24-hour urine at progression - Recent PET scan showed significant improvement in the liver lesion in the lung nodules. 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 Held to prevent kidney damage.  - Hold Avastin  therapy. - Reassess urine protein levels at  progression and consider restarting. - Will refer to nephrology for further assessment and rule out nephrotic syndrome  Chronic kidney disease Potentially exacerbated by Avastin . Referred to nephrology for nephrotic syndrome evaluation.  - Refer to nephrology for evaluation of nephrotic syndrome. - Send records to Dr. Ida Sprung at [facility].  History of DVT Patient has a history of left leg DVT maybe around 2020.  Patient's wife thought it was likely after second COVID infection Patient also has a documented history of factor V Leiden deficiency.  I could not find this report.   -Considering current metastatic colon cancer, continue Eliquis  5mg  twice daily.  Iron deficiency anemia Patient has improved hemoglobin at this time Last iron labs within normal limits   - Will continue to monitor at this time every 3 months  Chronic nasal drainage Claritin  ineffective.  -Will do a trial of Flonase for nasal drainage. - Continue Claritin .  General Health Maintenance Discussed flu and COVID booster vaccines. Timing not critical. - Receive flu and COVID booster vaccines.    The patient understands the plans discussed today and is in agreement with them.  He knows to contact our office if he develops concerns prior to his next appointment.  40 minutes of total time was spent for this patient encounter, including preparation, face-to-face counseling with the patient and coordination of care, physical exam, and documentation of the encounter.    [name], MD  [facility] [place] - A DEPT OF [place] [address] [place]  [address] Dept: [phone] Dept Fax: [phone]   No orders of the defined types were placed in this encounter.    ONCOLOGY HISTORY:   I have reviewed his chart and materials related to his cancer extensively and collaborated history with the patient. 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 -01/10/2022: Colonoscopy: A fungating, ulcerated, partially obstructing mass found at 42 cm proximal to the anus. Two 2-6 mm polyps in the transverse colon.  Pathology: Invasive moderately differentiated adenocarcinoma. -01/13/2022: CT CAP: No evidence of metastatic disease in the chest, abdomen, or pelvis. -02/17/2022: Left hemicolectomy.  Pathology: Moderately differentiated invasive adenocarcinoma in the descending colon, 5.8 cm, with clear margins of resection and negative mesenteric lymph nodes.  MMR preserved. pT3 pN0.  -07/13/2022: CT CAP:  Interval partial colectomy without evidence of local recurrence. No convincing evidence of metastatic disease within the chest, abdomen, or pelvis. -10/19/2022: CEA: 20.9 -11/09/2022: PET: Single hypermetabolic metastasis inferiorly in the right hepatic lobe. No other evidence of metastatic disease. Enlarging subcutaneous nodule posteriorly in the lower left chest with low level metabolic activity. -11/22/2022: MRI Liver: Isolated inferior right hepatic lobe 2.8 cm metastasis.  -12/27/2022: Thermal ablation of right lobe liver lesion -09/18/2023: MRI abdomen: Choledocholithiasis. Known metastasis of the inferior right lobe of the liver, hepatic segment VI. -10/08/2023: CEA: 94.8 -10/10/2023: Percutaneous microwave thermal ablation of right lobe liver mass -01/11/2024: CEA: 36.9 -01/11/2024: CT CAP: Numerous new and enlarged bilateral pulmonary nodules, consistent with worsened pulmonary metastatic disease. Expected appearance of ablation site of hepatic segment VI with overlying capsular retraction. No new liver lesions appreciated by CT. -01/11/2024: MRI liver: Interval ablation of a metastasis of hepatic segment VI. Residual or  recurrent rim enhancing tumor at the superior margin of the ablation site measuring 4.1 x 3.6 x 1.8 cm. Pulmonary nodules in the included lung bases, consistent with metastatic disease -01/24/2024: PET: Hypermetabolic right hepatic lobe masses, compatible with metastatic disease. The larger lesion has increased in size and metabolic activity compared to the prior PET scan. There is also a new smaller satellite right hepatic lobe lesion which is hypermetabolic. Scattered hypermetabolic pulmonary nodules, new compared to the prior chest CT but shown on the diagnostic CT from 01/11/2024. These are compatible with pulmonary metastases. -02/04/2024: Liver lesion biopsy.  -Pathology: Adenocarcinoma consistent with recurrent metastatic colonic adenocarcinoma.  -NGS: KRAS G13D, MSI-stable, TMB-low, HER2 (0), ATM, SMAD4, APC  -Guardant 360: KRAS G13D, K-ras K117N, ATM [id number].  UG T1A1 *28/*28 for metabolizer.  -02/18/2024:Seen Dr. Luverne for a Y90.  Because of the size of the lesions was recommended systemic treatment and can be considered for Y90 or SBRT later down the line. -02/26/2024 to current: Xeloda  1,000 mg BID 2 weeks on/1 week off -03/06/2024 to 04/17/2024: Bevacizumab  every 3 weeks.  Held on 05/09/2024 for 24-hour urine protein of 3875mg . -03/20/2024: Germline mutation testing: Negative -04/10/2024: PET scan: Much improved appearance of the liver. The large hypermetabolic lesion in the right hepatic lobe is no longer hypermetabolic. No new hepatic lesions.Persistent bilateral pulmonary nodules consistent with metastatic disease. These are slightly smaller and no longer hypermetabolic. No new or progressive findings.   Diagnosis: Adenoid cystic carcinoma  -06/2015: Resection of left upper lip.  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 -08/11/2015: Adjuvant radiation therapy, 50 Gray, completed  Current Treatment:  Capecitabine  1000mg   BID   INTERVAL HISTORY:   Discussed  the use of AI scribe software for clinical note transcription with the patient, who gave verbal consent to proceed.  History of Present Illness Herbert Wood is an 89 year old male with metastatic colon cancer who presents for follow-up and chemotherapy.  He is currently on Xeloda  for cancer treatment, which he restarted a new cycle today. Previously, he was also receiving Avastin , but it was held due to increased urine protein levels, with a 24-hour urine protein exceeding 2 grams.  Reports no complaints from Xeloda .  He has a long-standing issue with nasal drip, which has worsened recently. He takes two Claritin  daily without significant improvement. He had surgery for a deviated septum approximately 60 years ago, and his nose has dripped since then. He has not tried [employer].     I have reviewed the past medical history, past surgical history, social history and family history with the patient and they are unchanged from previous note.  ALLERGIES:  is allergic to gadobenate, diclofenac, and iodinated contrast media.  MEDICATIONS:  Current Outpatient Medications  Medication Sig Dispense Refill   amitriptyline  (ELAVIL ) 10 MG tablet Take 10 mg by mouth every evening.     amLODipine  (NORVASC ) 5 MG tablet Take 5 mg by mouth 2 (two) times daily.     apixaban  (ELIQUIS ) 5 MG TABS tablet Take 1 tablet (5 mg total) by mouth 2 (two) times daily. 60 tablet 3   ascorbic acid  (VITAMIN C /NATURAL ROSE HIPS) 1000 MG tablet Take 1,000 mg by mouth every evening.     atorvastatin  (LIPITOR) 40 MG tablet Take 40 mg by mouth every evening.     capecitabine  (XELODA ) 500 MG tablet Take 2 tablets (1,000 mg total) by mouth 2 (two) times daily after a meal. Take 2 weeks on/ 1 week off 56 tablet 1   Cholecalciferol  (VITAMIN D3) 125 MCG (5000 UT) CAPS Take 5,000 Units by mouth every evening.     dorzolamide -timolol (COSOPT) 2-0.5 % ophthalmic solution Place 1 drop into both eyes 2 (two) times daily.     ferrous sulfate   325 (65 FE) MG tablet Take 325 mg by mouth daily with breakfast.     folic acid  (FOLVITE ) 1 MG tablet Take 1 tablet (1 mg total) by mouth every evening. 30 tablet 0   hydrALAZINE  (APRESOLINE ) 25 MG tablet Take 50 mg by mouth in the morning and at bedtime.     KERENDIA  10 MG TABS Take 10 mg by mouth daily.     latanoprost  (XALATAN ) 0.005 % ophthalmic solution Place 1 drop into both eyes at bedtime.     linagliptin  (TRADJENTA ) 5 MG TABS tablet Take 5 mg by mouth in the morning.     loratadine  (CLARITIN ) 10 MG tablet Take 10 mg by mouth every morning.     losartan (COZAAR) 50 MG tablet Take 50 mg by mouth daily.     magnesium  oxide (MAG-OX) 400 (240 Mg) MG tablet Take 1 tablet (400 mg total) by mouth daily. 90 tablet 0   Misc Natural Products (PROSTATE SUPPORT PO) Take 1 capsule by mouth every evening. NOW Prostate Support     omeprazole  (PRILOSEC) 40 MG capsule Take 1 capsule (40 mg total) by mouth daily. 90 capsule 3   traMADol  (ULTRAM ) 50 MG tablet Take 1 tablet (50 mg total) by mouth at bedtime as needed. 30 tablet 0   vitamin B-12 (CYANOCOBALAMIN ) 1000 MCG tablet Take 1,000 mcg by mouth every evening. Now (Brand)  albuterol  (VENTOLIN  HFA) 108 (90 Base) MCG/ACT inhaler Inhale 2 puffs as needed by inhalation route. (Patient not taking: Reported on 07/18/2024)     furosemide (LASIX) 20 MG tablet Take 1 tablet every day by oral route as needed. (Patient not taking: Reported on 07/18/2024)     LORazepam  (ATIVAN ) 0.5 MG tablet Take 1 tablet as needed by oral route. (Patient not taking: Reported on 07/18/2024)     ondansetron  (ZOFRAN ) 4 MG tablet Take 1 tablet (4 mg total) by mouth every 6 (six) hours as needed for nausea. (Patient not taking: Reported on 07/18/2024) 20 tablet 0   ondansetron  (ZOFRAN -ODT) 4 MG disintegrating tablet 4mg  ODT q4 hours prn nausea/vomit (Patient not taking: Reported on 07/18/2024) 10 tablet 0   prochlorperazine  (COMPAZINE ) 10 MG tablet Take 1 tablet (10 mg total) by mouth  every 6 (six) hours as needed for nausea or vomiting. (Patient not taking: Reported on 07/18/2024) 30 tablet 1   tobramycin-dexamethasone  (TOBRADEX) ophthalmic solution 3 drops 3 (three) times daily. (Patient not taking: Reported on 07/18/2024)     No current facility-administered medications for this visit.    REVIEW OF SYSTEMS:   Constitutional: Denies fevers, chills or abnormal weight loss Eyes: Denies blurriness of vision Ears, nose, mouth, throat, and face: Denies mucositis or sore throat Respiratory: Denies cough, dyspnea or wheezes Cardiovascular: Denies palpitation, chest discomfort or lower extremity swelling Gastrointestinal:  Denies nausea, heartburn or change in bowel habits Skin: Denies abnormal skin rashes Lymphatics: Denies new lymphadenopathy or easy bruising Neurological:Denies numbness, tingling or new weaknesses Behavioral/Psych: Mood is stable, no new changes  All other systems were reviewed with the patient and are negative.   VITALS:  Weight 199 lb 15.3 oz (90.7 kg).  Wt Readings from Last 3 Encounters:  07/18/24 199 lb 15.3 oz (90.7 kg)  06/17/24 198 lb (89.8 kg)  05/19/24 197 lb 9.6 oz (89.6 kg)    Body mass index is 28.69 kg/m.  Performance status (ECOG): 1 - Symptomatic but completely ambulatory  PHYSICAL EXAM:   GENERAL:alert, no distress and comfortable SKIN: skin color, texture, turgor are normal, no rashes or significant lesions LYMPH:  no palpable lymphadenopathy in the cervical, axillary or inguinal LUNGS: clear to auscultation and percussion with normal breathing effort HEART: regular rate & rhythm and no murmurs and no lower extremity edema ABDOMEN:abdomen soft, non-tender and normal bowel sounds Musculoskeletal:no cyanosis of digits and no clubbing  NEURO: alert & oriented x 3 with fluent speech, no focal motor/sensory deficits  LABORATORY DATA:  I have reviewed the data as listed   Lab Results  Component Value Date   WBC 4.9  07/18/2024   NEUTROABS 3.1 07/18/2024   HGB 10.0 (L) 07/18/2024   HCT 28.7 (L) 07/18/2024   MCV 114.8 (H) 07/18/2024   PLT 106 (L) 07/18/2024      Chemistry      Component Value Date/Time   NA 141 07/18/2024 1313   K 4.4 07/18/2024 1313   CL 109 07/18/2024 1313   CO2 18 (L) 07/18/2024 1313   BUN 38 (H) 07/18/2024 1313   CREATININE 1.71 (H) 07/18/2024 1313      Component Value Date/Time   CALCIUM  8.8 (L) 07/18/2024 1313   ALKPHOS 126 07/18/2024 1313   AST 37 07/18/2024 1313   ALT 26 07/18/2024 1313   BILITOT 1.4 (H) 07/18/2024 1313       Latest Reference Range & Units 07/18/24 13:14  Appearance CLEAR  CLEAR  Bilirubin Urine NEGATIVE  NEGATIVE  Color, Urine YELLOW  YELLOW  Glucose, UA NEGATIVE mg/dL NEGATIVE  Hgb urine dipstick NEGATIVE  NEGATIVE  Ketones, ur NEGATIVE mg/dL NEGATIVE  Leukocytes,Ua NEGATIVE  SMALL !  Nitrite NEGATIVE  NEGATIVE  pH 5.0 - 8.0  5.0  Protein NEGATIVE mg/dL >=699 !  Specific Gravity, Urine 1.005 - 1.030  1.016  !: Data is abnormal   Latest Reference Range & Units 04/24/24 10:42  Urine Total Volume-UPROT mL 2,600  Collection Interval-UPROT hours 24  Protein, Urine mg/dL 850  Protein, 75Y Urine 50 - 100 mg/day 3,874 (H)  (H): Data is abnormally high   Latest Reference Range & Units 06/17/24 08:20  CEA 0.0 - 4.7 ng/mL 13.3 (H)  (H): Data is abnormally high   Latest Reference Range & Units 05/08/24 12:55  Iron 45 - 182 ug/dL 793 (H)  UIBC ug/dL 869  TIBC 749 - 549 ug/dL 663  Saturation Ratios 17.9 - 39.5 % 61 (H)  Ferritin 24 - 336 ng/mL 318  Folate >5.9 ng/mL 15.8  Vitamin B12 180 - 914 pg/mL 1,863 (H)  (H): Data is abnormally high  RADIOGRAPHIC STUDIES: I have personally reviewed the radiological images as listed and agreed with the findings in the report.  NM PET Image Restag (PS) Skull Base To Thigh CLINICAL DATA:  Subsequent treatment strategy for metastatic colon cancer.  EXAM: NUCLEAR MEDICINE PET SKULL BASE TO  THIGH  TECHNIQUE: 10.2 mCi F-18 FDG was injected intravenously. Full-ring PET imaging was performed from the skull base to thigh after the radiotracer. CT data was obtained and used for attenuation correction and anatomic localization.  Fasting blood glucose: 198 mg/dl  COMPARISON:  90/88/7974 and MR abdomen 01/11/2024.  FINDINGS: Mediastinal blood pool activity: SUV max 2.4  Liver activity: SUV max NA  NECK:  No abnormal hypermetabolism.  Incidental CT findings:  Retention cyst or polyp in the left maxillary sinus.  CHEST:  No abnormal hypermetabolism.  Incidental CT findings:  Atherosclerotic calcification of the aorta, aortic valve and coronary arteries. Enlarged pulmonic trunk and heart. No pericardial or pleural effusion. 8 mm posterior right lower lobe nodule (203/85), possibly minimally decreased from 11 mm on 04/10/2024. No associated abnormal hypermetabolism. Additional smaller nodules in the posterior left upper lobe and superior segment left lower lobe, stable, too small for PET resolution.  ABDOMEN/PELVIS:  Mildly hypodense mass in the inferior right hepatic lobe measures 3.1 x 3.7 cm (202/99), with minimal residual medial rim hypermetabolism, SUV max 3.7, unchanged. Otherwise, no abnormal hypermetabolism.  Incidental CT findings:  Low-attenuation lesions in the kidneys, characterized as cysts on MR abdomen 01/11/2024. Right paramidline supraumbilical hernia contains unobstructed small bowel. Small pelvic free fluid. Small amount of fluid in a small left inguinal hernia.  SKELETON:  No abnormal hypermetabolism.  Incidental CT findings:  Osteopenia.  Degenerative changes in the spine.  IMPRESSION: 1. Ablation defect in the right hepatic lobe with persistent mild hypermetabolism along the medial margin, unchanged. No evidence of disease progression. 2. Pulmonary metastases are stable and do not show abnormal hypermetabolism although are  borderline in size for PET resolution. 3. Right paramidline umbilical hernia contains unobstructed small bowel. 4. Aortic atherosclerosis (ICD10-I70.0). Coronary artery calcification. 5. Enlarged pulmonic trunk, indicative of pulmonary arterial hypertension.  Electronically Signed   By: Newell Eke M.D.   On: 07/14/2024 14:43     "

## 2024-07-19 ENCOUNTER — Other Ambulatory Visit: Payer: Self-pay

## 2024-07-19 LAB — CEA: CEA: 9.1 ng/mL — ABNORMAL HIGH (ref 0.0–4.7)

## 2024-07-20 ENCOUNTER — Encounter: Payer: Self-pay | Admitting: Oncology

## 2024-07-21 ENCOUNTER — Other Ambulatory Visit: Payer: Self-pay

## 2024-07-22 ENCOUNTER — Other Ambulatory Visit: Payer: Self-pay

## 2024-07-22 NOTE — Progress Notes (Signed)
 Specialty Pharmacy Refill Coordination Note  Herbert Wood is a 89 y.o. male contacted today regarding refills of specialty medication(s) Capecitabine  (XELODA )   Patient requested Delivery   Delivery date: 07/28/24   Verified address: [address] [place], [place]   [address]   Medication will be filled on: 07/25/24

## 2024-07-25 ENCOUNTER — Other Ambulatory Visit: Payer: Self-pay

## 2024-07-28 ENCOUNTER — Encounter: Payer: Self-pay | Admitting: *Deleted

## 2024-08-08 ENCOUNTER — Other Ambulatory Visit: Payer: Self-pay

## 2024-08-08 ENCOUNTER — Other Ambulatory Visit: Payer: Self-pay | Admitting: Oncology

## 2024-08-08 DIAGNOSIS — C187 Malignant neoplasm of sigmoid colon: Secondary | ICD-10-CM

## 2024-08-11 ENCOUNTER — Other Ambulatory Visit: Payer: Self-pay | Admitting: Oncology

## 2024-08-11 ENCOUNTER — Other Ambulatory Visit: Payer: Self-pay

## 2024-08-11 DIAGNOSIS — C187 Malignant neoplasm of sigmoid colon: Secondary | ICD-10-CM

## 2024-08-12 ENCOUNTER — Other Ambulatory Visit: Payer: Self-pay

## 2024-08-12 ENCOUNTER — Other Ambulatory Visit: Payer: Self-pay | Admitting: Oncology

## 2024-08-12 DIAGNOSIS — C187 Malignant neoplasm of sigmoid colon: Secondary | ICD-10-CM

## 2024-08-13 ENCOUNTER — Other Ambulatory Visit: Payer: Self-pay

## 2024-08-13 ENCOUNTER — Encounter: Payer: Self-pay | Admitting: Oncology

## 2024-08-13 ENCOUNTER — Other Ambulatory Visit: Payer: Self-pay | Admitting: *Deleted

## 2024-08-13 ENCOUNTER — Other Ambulatory Visit (HOSPITAL_COMMUNITY): Payer: Self-pay

## 2024-08-13 MED ORDER — CAPECITABINE 500 MG PO TABS
1000.0000 mg | ORAL_TABLET | Freq: Two times a day (BID) | ORAL | 1 refills | Status: AC
  Filled 2024-08-13: qty 56, 14d supply, fill #0
  Filled 2024-08-14: qty 56, 21d supply, fill #0
  Filled 2024-08-28 – 2024-09-01 (×2): qty 56, 21d supply, fill #1

## 2024-08-13 NOTE — Telephone Encounter (Signed)
 Chart reviewed. Xeloda  refilled per last office note with Dr. Davonna.

## 2024-08-14 ENCOUNTER — Other Ambulatory Visit: Payer: Self-pay

## 2024-08-14 ENCOUNTER — Other Ambulatory Visit: Payer: Self-pay | Admitting: Pharmacy Technician

## 2024-08-14 ENCOUNTER — Other Ambulatory Visit (HOSPITAL_COMMUNITY): Payer: Self-pay

## 2024-08-14 NOTE — Progress Notes (Signed)
 Specialty Pharmacy Refill Coordination Note  Herbert Wood is a 89 y.o. male contacted today regarding refills of specialty medication(s) Capecitabine  (XELODA )  Spoke with Wife  Patient requested Delivery   Delivery date: 08/15/24   Verified address: [address]  [place]   Medication will be filled on: 08/14/24

## 2024-08-19 ENCOUNTER — Other Ambulatory Visit: Payer: Self-pay

## 2024-08-27 ENCOUNTER — Other Ambulatory Visit: Payer: Self-pay

## 2024-08-27 ENCOUNTER — Other Ambulatory Visit: Payer: Self-pay | Admitting: Pharmacist

## 2024-08-27 NOTE — Progress Notes (Signed)
 Specialty Pharmacy Ongoing Clinical Assessment Note  Herbert Wood is a 89 y.o. male who is being followed by the specialty pharmacy service for RxSp Oncology   Patient's specialty medication(s) reviewed today: Capecitabine  (XELODA )   Missed doses in the last 4 weeks: 0   Patient/Caregiver did not have any additional questions or concerns.   Therapeutic benefit summary: Patient is achieving benefit   Adverse events/side effects summary: No adverse events/side effects   Patient's therapy is appropriate to: Continue    Goals Addressed             This Visit's Progress    Slow Disease Progression   On track    Patient is initiating therapy. Patient will maintain adherence         Follow up: 3 months  [name] Specialty Pharmacist

## 2024-08-28 ENCOUNTER — Other Ambulatory Visit: Payer: Self-pay

## 2024-09-01 ENCOUNTER — Other Ambulatory Visit: Payer: Self-pay

## 2024-09-01 ENCOUNTER — Encounter: Payer: Self-pay | Admitting: Oncology

## 2024-09-02 ENCOUNTER — Inpatient Hospital Stay

## 2024-09-02 ENCOUNTER — Inpatient Hospital Stay: Attending: Hematology | Admitting: Oncology

## 2024-09-02 VITALS — BP 134/49 | HR 44 | Temp 97.5°F | Resp 17 | Ht 70.0 in | Wt 193.0 lb

## 2024-09-02 DIAGNOSIS — D6481 Anemia due to antineoplastic chemotherapy: Secondary | ICD-10-CM

## 2024-09-02 DIAGNOSIS — C189 Malignant neoplasm of colon, unspecified: Secondary | ICD-10-CM

## 2024-09-02 DIAGNOSIS — Z86718 Personal history of other venous thrombosis and embolism: Secondary | ICD-10-CM

## 2024-09-02 DIAGNOSIS — C787 Secondary malignant neoplasm of liver and intrahepatic bile duct: Secondary | ICD-10-CM

## 2024-09-02 DIAGNOSIS — T451X5A Adverse effect of antineoplastic and immunosuppressive drugs, initial encounter: Secondary | ICD-10-CM | POA: Diagnosis not present

## 2024-09-02 DIAGNOSIS — C187 Malignant neoplasm of sigmoid colon: Secondary | ICD-10-CM

## 2024-09-02 DIAGNOSIS — N049 Nephrotic syndrome with unspecified morphologic changes: Secondary | ICD-10-CM

## 2024-09-02 LAB — CBC WITH DIFFERENTIAL/PLATELET
Abs Immature Granulocytes: 0.02 10*3/uL (ref 0.00–0.07)
Basophils Absolute: 0.1 10*3/uL (ref 0.0–0.1)
Basophils Relative: 1 %
Eosinophils Absolute: 0.2 10*3/uL (ref 0.0–0.5)
Eosinophils Relative: 3 %
HCT: 30.9 % — ABNORMAL LOW (ref 39.0–52.0)
Hemoglobin: 10.5 g/dL — ABNORMAL LOW (ref 13.0–17.0)
Immature Granulocytes: 0 %
Lymphocytes Relative: 20 %
Lymphs Abs: 1.2 10*3/uL (ref 0.7–4.0)
MCH: 40.1 pg — ABNORMAL HIGH (ref 26.0–34.0)
MCHC: 34 g/dL (ref 30.0–36.0)
MCV: 117.9 fL — ABNORMAL HIGH (ref 80.0–100.0)
Monocytes Absolute: 0.7 10*3/uL (ref 0.1–1.0)
Monocytes Relative: 12 %
Neutro Abs: 3.9 10*3/uL (ref 1.7–7.7)
Neutrophils Relative %: 64 %
Platelets: 123 10*3/uL — ABNORMAL LOW (ref 150–400)
RBC: 2.62 MIL/uL — ABNORMAL LOW (ref 4.22–5.81)
RDW: 14.9 % (ref 11.5–15.5)
WBC: 6.1 10*3/uL (ref 4.0–10.5)
nRBC: 0 % (ref 0.0–0.2)

## 2024-09-02 LAB — COMPREHENSIVE METABOLIC PANEL WITH GFR
ALT: 24 U/L (ref 0–44)
AST: 33 U/L (ref 15–41)
Albumin: 3.8 g/dL (ref 3.5–5.0)
Alkaline Phosphatase: 136 U/L — ABNORMAL HIGH (ref 38–126)
Anion gap: 14 (ref 5–15)
BUN: 43 mg/dL — ABNORMAL HIGH (ref 8–23)
CO2: 20 mmol/L — ABNORMAL LOW (ref 22–32)
Calcium: 8.8 mg/dL — ABNORMAL LOW (ref 8.9–10.3)
Chloride: 107 mmol/L (ref 98–111)
Creatinine, Ser: 1.91 mg/dL — ABNORMAL HIGH (ref 0.61–1.24)
GFR, Estimated: 34 mL/min — ABNORMAL LOW
Glucose, Bld: 115 mg/dL — ABNORMAL HIGH (ref 70–99)
Potassium: 4.8 mmol/L (ref 3.5–5.1)
Sodium: 140 mmol/L (ref 135–145)
Total Bilirubin: 0.9 mg/dL (ref 0.0–1.2)
Total Protein: 6.5 g/dL (ref 6.5–8.1)

## 2024-09-02 LAB — FERRITIN: Ferritin: 346 ng/mL — ABNORMAL HIGH (ref 24–336)

## 2024-09-02 LAB — VITAMIN B12: Vitamin B-12: 1423 pg/mL — ABNORMAL HIGH (ref 180–914)

## 2024-09-02 LAB — FOLATE: Folate: 9.8 ng/mL

## 2024-09-02 LAB — IRON AND TIBC
Iron: 172 ug/dL (ref 45–182)
Saturation Ratios: 50 % — ABNORMAL HIGH (ref 17.9–39.5)
TIBC: 346 ug/dL (ref 250–450)
UIBC: 174 ug/dL

## 2024-09-02 NOTE — Progress Notes (Signed)
Patient is taking Xeloda as prescribed.  He has not missed any doses and reports no side effects at this time.   

## 2024-09-02 NOTE — Patient Instructions (Addendum)
 [facility] at [facility] Discharge Instructions   You were seen and examined today by Dr. Davonna.  She reviewed the results of your lab work which are normal/stable.   Continue Xeloda  as prescribed.  We will see you back in one month. We will repeat a PET scan and lab work prior to this visit.    Return as scheduled.    Thank you for choosing [facility] at [facility] to provide your oncology and hematology care.  To afford each patient quality time with our provider, please arrive at least 15 minutes before your scheduled appointment time.   If you have a lab appointment with the Cancer Center please come in thru the Main Entrance and check in at the main information desk.  You need to re-schedule your appointment should you arrive 10 or more minutes late.  We strive to give you quality time with our providers, and arriving late affects you and other patients whose appointments are after yours.  Also, if you no show three or more times for appointments you may be dismissed from the clinic at the providers discretion.     Again, thank you for choosing [facility].  Our hope is that these requests will decrease the amount of time that you wait before being seen by our physicians.       _____________________________________________________________  Should you have questions after your visit to [facility], please contact our office at [phone] and follow the prompts.  Our office hours are 8:00 a.m. and 4:30 p.m. Monday - Friday.  Please note that voicemails left after 4:00 p.m. may not be returned until the following business day.  We are closed weekends and major holidays.  You do have access to a nurse 24-7, just call the main number to the clinic [phone] and do not press any options, hold on the line and a nurse will answer the phone.    For prescription refill requests, have your pharmacy contact our  office and allow 72 hours.    Due to Covid, you will need to wear a mask upon entering the hospital. If you do not have a mask, a mask will be given to you at the Main Entrance upon arrival. For doctor visits, patients may have 1 support person age 1 or older with them. For treatment visits, patients can not have anyone with them due to social distancing guidelines and our immunocompromised population.

## 2024-09-03 ENCOUNTER — Other Ambulatory Visit: Payer: Self-pay

## 2024-09-03 LAB — CEA: CEA: 9.5 ng/mL — ABNORMAL HIGH (ref 0.0–4.7)

## 2024-09-05 ENCOUNTER — Other Ambulatory Visit: Payer: Self-pay

## 2024-09-05 NOTE — Progress Notes (Signed)
 Specialty Pharmacy Refill Coordination Note  Herbert Wood is a 89 y.o. male contacted today regarding refills of specialty medication(s) Capecitabine  (XELODA )   Patient requested Delivery   Delivery date: 09/09/24   Verified address: [address]  [place]   Medication will be filled on: 09/08/24

## 2024-10-02 ENCOUNTER — Inpatient Hospital Stay: Attending: Hematology

## 2024-10-02 ENCOUNTER — Other Ambulatory Visit (HOSPITAL_COMMUNITY)

## 2024-10-08 ENCOUNTER — Inpatient Hospital Stay: Admitting: Oncology
